# Patient Record
Sex: Female | Born: 1950
Health system: Southern US, Community
[De-identification: ages and names within clinical notes are randomized; demographics above are authoritative.]

## PROBLEM LIST (undated history)

## (undated) DIAGNOSIS — I499 Cardiac arrhythmia, unspecified: Secondary | ICD-10-CM

## (undated) DIAGNOSIS — N200 Calculus of kidney: Secondary | ICD-10-CM

## (undated) DIAGNOSIS — K5792 Diverticulitis of intestine, part unspecified, without perforation or abscess without bleeding: Secondary | ICD-10-CM

## (undated) DIAGNOSIS — M1990 Unspecified osteoarthritis, unspecified site: Secondary | ICD-10-CM

## (undated) DIAGNOSIS — I10 Essential (primary) hypertension: Secondary | ICD-10-CM

## (undated) DIAGNOSIS — Z8719 Personal history of other diseases of the digestive system: Secondary | ICD-10-CM

## (undated) DIAGNOSIS — J302 Other seasonal allergic rhinitis: Secondary | ICD-10-CM

## (undated) DIAGNOSIS — E119 Type 2 diabetes mellitus without complications: Secondary | ICD-10-CM

## (undated) DIAGNOSIS — R7989 Other specified abnormal findings of blood chemistry: Secondary | ICD-10-CM

## (undated) DIAGNOSIS — K5730 Diverticulosis of large intestine without perforation or abscess without bleeding: Secondary | ICD-10-CM

## (undated) DIAGNOSIS — N201 Calculus of ureter: Secondary | ICD-10-CM

## (undated) DIAGNOSIS — F419 Anxiety disorder, unspecified: Secondary | ICD-10-CM

## (undated) DIAGNOSIS — K219 Gastro-esophageal reflux disease without esophagitis: Secondary | ICD-10-CM

## (undated) DIAGNOSIS — Z87442 Personal history of urinary calculi: Secondary | ICD-10-CM

## (undated) DIAGNOSIS — Z9989 Dependence on other enabling machines and devices: Secondary | ICD-10-CM

## (undated) DIAGNOSIS — D649 Anemia, unspecified: Secondary | ICD-10-CM

## (undated) DIAGNOSIS — G4733 Obstructive sleep apnea (adult) (pediatric): Secondary | ICD-10-CM

## (undated) HISTORY — PX: COLON RESECTION: SHX5231

## (undated) HISTORY — PX: BARIATRIC SURGERY: SHX1103

## (undated) HISTORY — PX: OTHER SURGICAL HISTORY: SHX169

## (undated) HISTORY — DX: Other seasonal allergic rhinitis: J30.2

## (undated) HISTORY — PX: EXTRACORPOREAL SHOCK WAVE LITHOTRIPSY: SHX1557

## (undated) HISTORY — PX: CATARACT EXTRACTION: SUR2

---

## 1974-11-22 HISTORY — PX: CERVICAL CONE BIOPSY: SUR198

## 1985-11-22 HISTORY — PX: TUBAL LIGATION: SHX77

## 1998-07-03 ENCOUNTER — Ambulatory Visit (HOSPITAL_COMMUNITY): Admission: RE | Admit: 1998-07-03 | Discharge: 1998-07-03 | Payer: Self-pay

## 1998-07-07 ENCOUNTER — Ambulatory Visit (HOSPITAL_COMMUNITY): Admission: RE | Admit: 1998-07-07 | Discharge: 1998-07-07 | Payer: Self-pay | Admitting: *Deleted

## 1998-07-10 ENCOUNTER — Ambulatory Visit (HOSPITAL_COMMUNITY): Admission: RE | Admit: 1998-07-10 | Discharge: 1998-07-10 | Payer: Self-pay | Admitting: Urology

## 2011-07-10 ENCOUNTER — Observation Stay (HOSPITAL_COMMUNITY)
Admission: EM | Admit: 2011-07-10 | Discharge: 2011-07-11 | Disposition: A | Discharge status: Home / Self Care | Payer: Self-pay | Attending: Urology | Admitting: Urology

## 2011-07-10 ENCOUNTER — Emergency Department (HOSPITAL_COMMUNITY): Payer: Self-pay

## 2011-07-10 DIAGNOSIS — I10 Essential (primary) hypertension: Secondary | ICD-10-CM | POA: Insufficient documentation

## 2011-07-10 DIAGNOSIS — E119 Type 2 diabetes mellitus without complications: Secondary | ICD-10-CM | POA: Insufficient documentation

## 2011-07-10 DIAGNOSIS — D72829 Elevated white blood cell count, unspecified: Secondary | ICD-10-CM | POA: Insufficient documentation

## 2011-07-10 DIAGNOSIS — N201 Calculus of ureter: Principal | ICD-10-CM | POA: Insufficient documentation

## 2011-07-10 DIAGNOSIS — Z79899 Other long term (current) drug therapy: Secondary | ICD-10-CM | POA: Insufficient documentation

## 2011-07-10 DIAGNOSIS — K5730 Diverticulosis of large intestine without perforation or abscess without bleeding: Secondary | ICD-10-CM | POA: Insufficient documentation

## 2011-07-10 DIAGNOSIS — R112 Nausea with vomiting, unspecified: Secondary | ICD-10-CM | POA: Insufficient documentation

## 2011-07-10 DIAGNOSIS — N200 Calculus of kidney: Secondary | ICD-10-CM | POA: Insufficient documentation

## 2011-07-10 DIAGNOSIS — N1330 Unspecified hydronephrosis: Secondary | ICD-10-CM | POA: Insufficient documentation

## 2011-07-10 DIAGNOSIS — E860 Dehydration: Secondary | ICD-10-CM | POA: Insufficient documentation

## 2011-07-10 DIAGNOSIS — R109 Unspecified abdominal pain: Secondary | ICD-10-CM | POA: Insufficient documentation

## 2011-07-10 DIAGNOSIS — N179 Acute kidney failure, unspecified: Secondary | ICD-10-CM | POA: Insufficient documentation

## 2011-07-10 HISTORY — PX: OTHER SURGICAL HISTORY: SHX169

## 2011-07-10 LAB — CBC
Hemoglobin: 13.8 g/dL (ref 12.0–15.0)
RBC: 4.5 MIL/uL (ref 3.87–5.11)
WBC: 14.1 10*3/uL — ABNORMAL HIGH (ref 4.0–10.5)

## 2011-07-10 LAB — POCT I-STAT, CHEM 8
Creatinine, Ser: 1.9 mg/dL — ABNORMAL HIGH (ref 0.50–1.10)
HCT: 42 % (ref 36.0–46.0)
Hemoglobin: 14.3 g/dL (ref 12.0–15.0)
Potassium: 3.7 mEq/L (ref 3.5–5.1)
Sodium: 138 mEq/L (ref 135–145)

## 2011-07-10 LAB — GLUCOSE, CAPILLARY
Glucose-Capillary: 104 mg/dL — ABNORMAL HIGH (ref 70–99)
Glucose-Capillary: 117 mg/dL — ABNORMAL HIGH (ref 70–99)

## 2011-07-10 LAB — URINALYSIS, ROUTINE W REFLEX MICROSCOPIC
Bilirubin Urine: NEGATIVE
Ketones, ur: NEGATIVE mg/dL
Nitrite: NEGATIVE
pH: 6 (ref 5.0–8.0)

## 2011-07-10 LAB — URINE MICROSCOPIC-ADD ON

## 2011-07-11 LAB — URINE CULTURE
Colony Count: NO GROWTH
Culture: NO GROWTH

## 2011-07-11 LAB — BASIC METABOLIC PANEL
CO2: 28 mEq/L (ref 19–32)
Chloride: 102 mEq/L (ref 96–112)
GFR calc Af Amer: 41 mL/min — ABNORMAL LOW (ref >60)
Sodium: 138 mEq/L (ref 135–145)

## 2011-07-11 LAB — CBC
Platelets: 214 10*3/uL (ref 150–400)
RBC: 3.91 MIL/uL (ref 3.87–5.11)
WBC: 8.6 10*3/uL (ref 4.0–10.5)

## 2011-07-11 LAB — GLUCOSE, CAPILLARY

## 2011-07-15 NOTE — H&P (Signed)
Marilyn Myers, Marilyn Myers NO.:  [id number]  MEDICAL RECORD NO.:  [id number]  LOCATION:  1401                         FACILITY:  [facility]  PHYSICIAN:  Sebastian Ache, MD     DATE OF BIRTH:  [birth year]  DATE OF ADMISSION:  07/10/2011 DATE OF DISCHARGE:                             HISTORY & PHYSICAL   CHIEF COMPLAINT:  Flank pain, bilateral ureteral stent, acute renal failure.  HISTORY OF PRESENT ILLNESS:  Marilyn Myers is a 60 year old female with history of prior nephrolithiasis x1 who presents to [facility] ER with one week of left greater than right flank pain.  The CT evidenced bilateral ureteral stents.  She began having discomfort 12 weeks ago, which felt similar to a prior stone episode, is colicky and 5/10 to 8/10, located in the left flank, radiating towards the groin and was associated with nausea with some emesis.  Since it became acutely worse 3 days ago and became accompanied by more vomiting and severe pain.  The patient's husband became concerned about the emesis and pain and therefore she presents to the emergency room today.  She denies any fevers, dysuria, or history of severe cardiovascular disease.  PAST MEDICAL HISTORY: 1. Urolithiasis.  One prior episode of status post lithotripsy in 1999     by Dr. Patsi Sears. 2. Hypertension. 3. Type 2 diabetes.  SOCIAL HISTORY:  The patient does not drink, nonsmoker.  She denies domestic abuse.  FAMILY HISTORY:  Significant for coronary artery disease and diabetes. No nephrolithiasis.  REVIEW OF SYSTEMS:  NEUROLOGIC:  Denies dizziness, confusion. MUSCULOSKELETAL:  Admits to achy pain.  CARDIAC:  Denies chest pain. Denies shortness of breath.  GI:  Admits to nausea and vomiting.  GU: Denies hematuria, but admits decreased urine output.  HEMATOLOGIC: Denies any easy bruising.  PSYCHIATRIC:  Denies change of mood.  PHYSICAL EXAMINATION:  VITAL SIGNS:  Temp 97.4, pulse 104, respirations 22, blood pressure  134/86. GENERAL:  Marilyn Myers is a pleasant female who appears her stated age. She is accompanied by her husband. HEENT:  Normocephalic, atraumatic. CARDIAC:  Slightly tachycardiac, regular rhythm.  No murmurs, rubs, or gallops. PULMONARY:  No wheezing. ABDOMEN:  Obese.  Left greater than right CVA tenderness.  No surgical scars noted. GU:  Normal-appearing external genitalia. MUSCULOSKELETAL:  No cord, cyanosis, or edema. PSYCHIATRIC:  The patient is euthymic.  MEDICATIONS:  The patient is on lisinopril and metformin.  ALLERGIES:  GI upset with CODEINE  LABS:  CBC shows mild leukocytosis of 14,000; hemoglobin normal at 13.8. BMP significant for evidence of acute renal failure with creatinine of 1.9 and BUN 28.  Urinalysis without significant blood or white cells.  IMAGING:  CT stone study reveals bilateral ureteral stones, the right proximal 10 mm stone, 1200 Hounsfield Units associated what appears to be a chronically obstructed nature of the kidney. On the left side, a 5 mm distal UPJ stone of 300 Hounsfield Units was noted with mild hydroureteronephrosis.  ASSESSMENT:  A 60 year old female with bilateral obstructing stones and acute renal failure.  PLAN: 1. Bilateral stones, acute renal failure.  Options were discussed to     achieve the goal of short-term renal drain  including nephrostomy     tube versus stenting and the patient wished to proceed with a trial     of bilateral stenting.  Informed consent was obtained and placed in     the medical record and planned that her definitive management would     be informed in delayed fashion. 2. Nausea, vomiting, and dehydration.  The patient will be given     vigorous IV hydration. 3. Pain control. 4. Admit to urology service, Dr. Berneice Heinrich.          ______________________________ Sebastian Ache, MD     TM/MEDQ  D:  07/10/2011  T:  07/10/2011  Job:  [id number]  Electronically Signed by Lindaann Slough M.D. on 07/15/2011  05:13:53 PM

## 2011-07-15 NOTE — Op Note (Signed)
NAMEDEMPSEY, KNOTEK               ACCOUNT NO.:  [id number]  MEDICAL RECORD NO.:  [id number]  LOCATION:  1401                         FACILITY:  [facility]  PHYSICIAN:  Sebastian Ache, MD     DATE OF BIRTH:  19-Jan-1951  DATE OF PROCEDURE: DATE OF DISCHARGE:                              OPERATIVE REPORT   PREOPERATIVE DIAGNOSES: 1. Bilateral obstructing stones. 2. Acute renal failure.  POSTOPERATIVE DIAGNOSES: 1. Bilateral obstructing stones. 2. Acute renal failure.  PROCEDURES:  Cystoscopy with bilateral retrograde pyelograms and insertion of bilateral ureteral stents.  SURGEON:  Sebastian Ache, MD  FINDINGS: 1. Right proximal obstructing stone with significant moderate to     severe hydronephrosis and impaction of stone. 2. Left distal ureteral stones without significant     hydroureteronephrosis.  STENTS: 1. Right 6 x 28, no tether. 2. Left 6 x 24, no tether.  SPECIMENS:  None.  COMPLICATIONS:  None.  ESTIMATED BLOOD LOSS:  Nil.  ANESTHESIA:  General LMA.  INDICATION:  [name] is a 60 year old female with a remote history of prior nephrolithiasis, who presented with 1 week history of flank pain, progressive nausea and vomiting.  She underwent evaluation in the [facility] Emergency Room where axial imaging revealed bilateral ureteral stones and serum studies revealed acute renal failure.  Given this constellation of symptoms, it was felt that urgent renal decompression was warranted.  Options were discussed with the patient including ureteral stenting versus nephrostomy and she wished to proceed with ureteral stenting.  Informed consent was obtained and placed in the medical record.  PROCEDURE IN DETAIL:  Patient being Marilyn Myers, our objective being bilateral ureteral stents.  Operative case was confirmed.  Procedure was carried out __________intravenous antibiotics  administered.  General LMA anesthesia was introduced.  Patient was placed into a low  lithotomy position in the sterile field and secured.  We prepped and draped patient's vagina, introitus, and partial thighs using iodine x2.  Next, cystourethroscopy was performed using a 22-French rigid cystoscope with 30-degree lens.  Inspection of urinary bladder revealed no diverticula, calcifications, papillary lesions.  The ureteral orifices were in the normal anatomic position.  The right ureteral orifice was gently cannulated using a 6-French __________ retrograde pyelogram was seen.  Right retrograde pyelogram revealed this is single right ureter that was decompressed distally.  There was a calcification noted in the proximal third of the ureter with severe hydronephrosis above this point. Contrast minimally above this suggesting impaction.  Next, a sensor type wire was advanced very carefully along the said calcification and above it allowing navigation of the end-hole above it as well.  Further retrograde pyelography was performed which corroborated again severe hydronephrosis.  Next, a 6 x 24 stent was initially placed on the right side using cystocope under fluoroscopic guidance; however, the proximal curl was felt to be too distal given the significant dilation, and as such, this was exchanged over a sensor wire  for a 6 x 28 stent which was curled proximally into a dilated  calyx.  This appeared to be in excellent position.  There was efflux of urine through in and out of the distal end of the stent.  Attention was  directed to the left side.  A 6- French catheter was inserted to left ureter and left retrograde pyelogram was seen.  Left retro pyelogram demonstrated several small filling defects in the distal ureter consistent with known stone.  There was minimal hydroureteronephrosis above this.  A 0.06 guide wire was advanced to the level of the upper pole of the left kidney over which a new 6 x 24 double-J stent was placed using cystoscopy and fluoroscopic  guidance. Good proximal and distal curls were noted.  There was efflux of urine seen around and through the distal end of the stent.  Next, the cystoscope was changed for a new 16-French Foley catheter per urethra to straight drain with 10 mL of sterile water  in the balloon.  Procedure was then terminated.  The patient tolerated the procedure well.  There were no immediate perioperative complications. .  Patient was taken to Postanesthesia Care Unit in stable condition.          ______________________________ Sebastian Ache, MD     TM/MEDQ  D:  07/10/2011  T:  07/11/2011  Job:  [id number]  Electronically Signed by Lindaann Slough M.D. on 07/15/2011 05:13:41 PM

## 2011-08-18 ENCOUNTER — Other Ambulatory Visit: Payer: Self-pay | Admitting: Urology

## 2011-08-18 ENCOUNTER — Other Ambulatory Visit (HOSPITAL_COMMUNITY): Payer: Self-pay | Admitting: Urology

## 2011-08-18 ENCOUNTER — Ambulatory Visit (HOSPITAL_COMMUNITY)
Admission: RE | Admit: 2011-08-18 | Discharge: 2011-08-18 | Disposition: A | Discharge status: Home / Self Care | Payer: Self-pay | Source: Ambulatory Visit | Attending: Urology | Admitting: Urology

## 2011-08-18 ENCOUNTER — Encounter (HOSPITAL_COMMUNITY): Payer: Self-pay

## 2011-08-18 DIAGNOSIS — Z01812 Encounter for preprocedural laboratory examination: Secondary | ICD-10-CM | POA: Insufficient documentation

## 2011-08-18 DIAGNOSIS — Z01818 Encounter for other preprocedural examination: Secondary | ICD-10-CM | POA: Insufficient documentation

## 2011-08-18 DIAGNOSIS — Z01810 Encounter for preprocedural cardiovascular examination: Secondary | ICD-10-CM | POA: Insufficient documentation

## 2011-08-18 LAB — BASIC METABOLIC PANEL
BUN: 16 mg/dL (ref 6–23)
Chloride: 102 mEq/L (ref 96–112)
GFR calc Af Amer: >60 mL/min (ref >60)
GFR calc non Af Amer: 58 mL/min — ABNORMAL LOW (ref >60)
Potassium: 3.1 mEq/L — ABNORMAL LOW (ref 3.5–5.1)
Sodium: 140 mEq/L (ref 135–145)

## 2011-08-18 LAB — CBC
HCT: 38.2 % (ref 36.0–46.0)
Hemoglobin: 12.6 g/dL (ref 12.0–15.0)
RDW: 13.5 % (ref 11.5–15.5)
WBC: 10.3 10*3/uL (ref 4.0–10.5)

## 2011-08-18 LAB — SURGICAL PCR SCREEN: Staphylococcus aureus: NEGATIVE

## 2011-08-20 ENCOUNTER — Ambulatory Visit (HOSPITAL_COMMUNITY)
Admission: RE | Admit: 2011-08-20 | Discharge: 2011-08-20 | Disposition: A | Discharge status: Home / Self Care | Payer: Self-pay | Source: Ambulatory Visit | Attending: Urology | Admitting: Urology

## 2011-08-20 DIAGNOSIS — I10 Essential (primary) hypertension: Secondary | ICD-10-CM | POA: Insufficient documentation

## 2011-08-20 DIAGNOSIS — E119 Type 2 diabetes mellitus without complications: Secondary | ICD-10-CM | POA: Insufficient documentation

## 2011-08-20 DIAGNOSIS — Z01812 Encounter for preprocedural laboratory examination: Secondary | ICD-10-CM | POA: Insufficient documentation

## 2011-08-20 DIAGNOSIS — Z79899 Other long term (current) drug therapy: Secondary | ICD-10-CM | POA: Insufficient documentation

## 2011-08-20 DIAGNOSIS — Z01818 Encounter for other preprocedural examination: Secondary | ICD-10-CM | POA: Insufficient documentation

## 2011-08-20 DIAGNOSIS — N201 Calculus of ureter: Secondary | ICD-10-CM | POA: Insufficient documentation

## 2011-08-20 DIAGNOSIS — E669 Obesity, unspecified: Secondary | ICD-10-CM | POA: Insufficient documentation

## 2011-08-20 DIAGNOSIS — Z01810 Encounter for preprocedural cardiovascular examination: Secondary | ICD-10-CM | POA: Insufficient documentation

## 2011-08-20 LAB — GLUCOSE, CAPILLARY
Glucose-Capillary: 139 mg/dL — ABNORMAL HIGH (ref 70–99)
Glucose-Capillary: 126 mg/dL — ABNORMAL HIGH (ref 70–99)

## 2011-08-27 NOTE — Op Note (Signed)
NAMESANIYA, TRANCHINA NO.:  [id number]  MEDICAL RECORD NO.:  [id number]  LOCATION:  DAYL                         FACILITY:  [facility]  PHYSICIAN:  Danae Chen, M.D.  DATE OF BIRTH:  1950-11-26  DATE OF PROCEDURE:  08/20/2011 DATE OF DISCHARGE:                              OPERATIVE REPORT   PREOPERATIVE DIAGNOSIS:  Bilateral renal calculi.  PROCEDURE DONE:  Cystoscopy, retrograde pyelograms, ureteroscopy, left ureteral stone extraction, and insertion of double-J stent.  SURGEON:  Danae Chen, MD  ANESTHESIA:  General.  INDICATION:  The patient is a 60 year old female who was seen in the ER on July 10, 2011, with bilateral flank pain.  She also had renal insufficiency.  Her BUN was 28, creatinine 1.9.  CT scan showed a 1 cm stone in the proximal ureter with moderate-to-severe right hydronephrosis and a 5 mm stone in the left distal ureter without hydronephrosis.  Bilateral double-J stent were inserted on August 19th. Her BUN is down to 26, creatinine is down to 0.97.  Treatment options were discussed with the patient, ureteroscopy with stone extraction versus ESL.  She elected to have stone extraction.  She is scheduled today for the procedure.  The patient was identified by her wristband and proper time-out was taken.  Under general anesthesia, she was prepped and draped, and placed in the dorsal lithotomy position.  A panendoscope was inserted in the bladder. The bladder mucosa is reddened.  The trigone is edematous.  The distal ends of the double-J stent are in the bladder.  The left double-J stent was then pulled out of the urethra with a grasping forceps and  a sensor wire could not be passed through the stent because of encrustations.  The double-J stent was then removed.  Left retrograde pyelogram.  A cone-tip catheter was passed through the cystoscope and through the left ureteral orifice and contrast was injected through the cone-tip catheter.   There is a filling defect in the distal ureter.  The proximal ureter appears normal.  The renal pelvis and calyces are normal.  The cone-tip catheter was removed.  A sensor wire was then passed through the cystoscope into the left ureter.  The cystoscope was removed.  A semi-rigid ureteroscope was then passed in the bladder and through the left ureter.  A stone was visualized in the distal ureter.  The stone was grasped within the wires of a nitinol basket and extracted without difficulty.  The ureteroscope was then re-inserted in the ureter and advanced up the ureter without difficulty.  There is no evidence of stone in the ureter.  Since the ureter is patent, it was decided not to place a double-J stent on that side.  The ureteroscope was removed.  The cystoscope was then re-inserted in the bladder and the right double-J stent was pulled out of the urethra. Again, a sensor wire could not be passed through the double-J stent because of encrustations.  The double-J stent was then removed.  Retrograde pyelogram.  A cone-tip catheter was passed through the cystoscope and through the right ureteral orifice.  Contrast was then injected through the cone-tip catheter.  The distal and mid ureter appear  normal.  There appears to be a filling defect in the proximal ureter.  The calyces and renal pelvis are mildly dilated.  The cone-tip catheter was then removed.  A sensor wire was passed through the cystoscope and through the right ureter.  A semi-rigid ureteroscope was then inserted in the bladder and through the right ureter and advanced all the way up into the upper ureter.  There is a stone in the proximal ureter, but the stone then migrated into the kidney.  The semi-rigid ureteroscope was then removed. A ureteroscope access sheath was then passed over the sensor wire and the sensor wire was removed.  A flexible digital ureteroscope was passed through the ureteroscope access sheath, all  the way up the ureter into the renal pelvis.  The stone had migrated into a calix, but because of the position of the stone, it was difficult to place the stone within the wires of a nitinol basket.  A holmium laser was then passed through the ureteroscope.  There were multiple debris in the calix.  Several attempts were made to fragment the stone with the holmium laser, but because of the debris in the calix, it was difficult to clearly have a good shot at fragmenting the stone.  After several attempts, it was decided to end the procedure.  The flexible ureteroscope was then removed.  A sensor wire was passed through the ureteroscope access sheath and the ureteroscope access sheath was removed.  The sensor wire was then back loaded into the cystoscope and a 6-26 double-J stent was passed over the guidewire.  The guidewire was removed.  The proximal curl of the double-J stent is in the renal pelvis.  The distal curl is in the bladder.  The bladder was then emptied and cystoscope and guidewire were removed.  The patient tolerated the procedure well and left the OR in satisfactory condition to post-anesthesia care unit.   Danae Chen, M.D.     MN/MEDQ  D:  08/20/2011  T:  08/20/2011  Job:  [id number]  Electronically Signed by Lindaann Slough M.D. on 08/27/2011 09:07:26 AM

## 2011-09-29 ENCOUNTER — Other Ambulatory Visit: Payer: Self-pay | Admitting: Urology

## 2011-09-30 ENCOUNTER — Other Ambulatory Visit: Payer: Self-pay | Admitting: Urology

## 2011-10-05 ENCOUNTER — Encounter (HOSPITAL_COMMUNITY): Payer: Self-pay

## 2011-10-05 ENCOUNTER — Encounter (HOSPITAL_COMMUNITY): Payer: Self-pay | Admitting: *Deleted

## 2011-10-05 NOTE — Progress Notes (Signed)
Instructed no Aspirin products 3 days prior to procedure' Reminded to take laxitive Wed PM

## 2011-10-07 ENCOUNTER — Ambulatory Visit (HOSPITAL_COMMUNITY)
Admission: RE | Admit: 2011-10-07 | Discharge: 2011-10-07 | Disposition: A | Discharge status: Home / Self Care | Payer: Self-pay | Source: Ambulatory Visit | Attending: Urology | Admitting: Urology

## 2011-10-07 ENCOUNTER — Encounter (HOSPITAL_COMMUNITY)
Admission: RE | Disposition: A | Discharge status: Home / Self Care | Payer: Self-pay | Source: Ambulatory Visit | Attending: Urology

## 2011-10-07 DIAGNOSIS — E669 Obesity, unspecified: Secondary | ICD-10-CM | POA: Insufficient documentation

## 2011-10-07 DIAGNOSIS — I10 Essential (primary) hypertension: Secondary | ICD-10-CM | POA: Insufficient documentation

## 2011-10-07 DIAGNOSIS — E119 Type 2 diabetes mellitus without complications: Secondary | ICD-10-CM | POA: Insufficient documentation

## 2011-10-07 DIAGNOSIS — N201 Calculus of ureter: Secondary | ICD-10-CM | POA: Insufficient documentation

## 2011-10-07 HISTORY — DX: Essential (primary) hypertension: I10

## 2011-10-07 LAB — GLUCOSE, CAPILLARY: Glucose-Capillary: 111 mg/dL — ABNORMAL HIGH (ref 70–99)

## 2011-10-07 SURGERY — LITHOTRIPSY, ESWL
Anesthesia: LOCAL | Laterality: Right

## 2011-10-07 MED ORDER — METFORMIN HCL 500 MG PO TABS
500.0000 mg | ORAL_TABLET | Freq: Two times a day (BID) | ORAL | Status: DC
  Filled 2011-10-07 (×3): qty 1

## 2011-10-07 MED ORDER — DOCUSATE SODIUM 100 MG PO CAPS
100.0000 mg | ORAL_CAPSULE | Freq: Every day | ORAL | Status: DC
  Filled 2011-10-07 (×2): qty 1

## 2011-10-07 MED ORDER — DIPHENHYDRAMINE HCL 25 MG PO CAPS
25.0000 mg | ORAL_CAPSULE | ORAL | Status: AC
  Administered 2011-10-07: 25 mg via ORAL

## 2011-10-07 MED ORDER — LISINOPRIL 20 MG PO TABS
20.0000 mg | ORAL_TABLET | Freq: Every day | ORAL | Status: DC
  Filled 2011-10-07 (×2): qty 1

## 2011-10-07 MED ORDER — DIPHENHYDRAMINE HCL 25 MG PO CAPS
ORAL_CAPSULE | ORAL | Status: AC
  Administered 2011-10-07: 25 mg via ORAL
  Filled 2011-10-07: qty 1

## 2011-10-07 MED ORDER — DIAZEPAM 5 MG PO TABS
10.0000 mg | ORAL_TABLET | ORAL | Status: AC
  Administered 2011-10-07: 10 mg via ORAL

## 2011-10-07 MED ORDER — DEXTROSE-NACL 5-0.45 % IV SOLN
INTRAVENOUS | Status: DC
  Administered 2011-10-07: 11:00:00 via INTRAVENOUS

## 2011-10-07 MED ORDER — DIAZEPAM 5 MG PO TABS
ORAL_TABLET | ORAL | Status: AC
  Administered 2011-10-07: 10 mg via ORAL
  Filled 2011-10-07: qty 2

## 2011-10-07 MED ORDER — CIPROFLOXACIN IN D5W 400 MG/200ML IV SOLN
400.0000 mg | Freq: Once | INTRAVENOUS | Status: AC
  Administered 2011-10-07: 400 mg via INTRAVENOUS
  Filled 2011-10-07: qty 200

## 2011-10-07 NOTE — Progress Notes (Signed)
After IV Cipro had infused and IV fluids were flushing the line pt c/o of itching at IV site and up arm . Pink area about 6cmx3cm  Right wrist and right forarm 3cm by 3cm pt is scratiching this area. No other itching area and pt denies any resp problems. Cool cloth to righ arm Offered to call MD for order for Benadryl and pt declined this. She is eager to go home. Pt states she has Benadryl at home and will take it when she gets there.

## 2011-10-07 NOTE — Progress Notes (Signed)
Pt denies any aspirin ,ibuprofen,toradol in last 72 hours Pt took laxative as directed last night ....good results

## 2011-10-08 ENCOUNTER — Encounter (HOSPITAL_BASED_OUTPATIENT_CLINIC_OR_DEPARTMENT_OTHER): Payer: Self-pay | Admitting: Anesthesiology

## 2011-10-08 ENCOUNTER — Encounter (HOSPITAL_BASED_OUTPATIENT_CLINIC_OR_DEPARTMENT_OTHER)
Admission: RE | Disposition: A | Discharge status: Home / Self Care | Payer: Self-pay | Source: Ambulatory Visit | Attending: Urology

## 2011-10-08 ENCOUNTER — Encounter (HOSPITAL_BASED_OUTPATIENT_CLINIC_OR_DEPARTMENT_OTHER): Payer: Self-pay | Admitting: *Deleted

## 2011-10-08 ENCOUNTER — Ambulatory Visit (HOSPITAL_BASED_OUTPATIENT_CLINIC_OR_DEPARTMENT_OTHER): Payer: Self-pay | Admitting: Anesthesiology

## 2011-10-08 ENCOUNTER — Other Ambulatory Visit: Payer: Self-pay | Admitting: Urology

## 2011-10-08 ENCOUNTER — Ambulatory Visit (HOSPITAL_BASED_OUTPATIENT_CLINIC_OR_DEPARTMENT_OTHER)
Admission: RE | Admit: 2011-10-08 | Discharge: 2011-10-08 | Disposition: A | Discharge status: Home / Self Care | Payer: Self-pay | Source: Ambulatory Visit | Attending: Urology | Admitting: Urology

## 2011-10-08 DIAGNOSIS — N1330 Unspecified hydronephrosis: Secondary | ICD-10-CM | POA: Insufficient documentation

## 2011-10-08 DIAGNOSIS — Z79899 Other long term (current) drug therapy: Secondary | ICD-10-CM | POA: Insufficient documentation

## 2011-10-08 DIAGNOSIS — I10 Essential (primary) hypertension: Secondary | ICD-10-CM | POA: Insufficient documentation

## 2011-10-08 DIAGNOSIS — E119 Type 2 diabetes mellitus without complications: Secondary | ICD-10-CM | POA: Insufficient documentation

## 2011-10-08 DIAGNOSIS — N201 Calculus of ureter: Secondary | ICD-10-CM | POA: Insufficient documentation

## 2011-10-08 HISTORY — PX: CYSTOSCOPY/RETROGRADE/URETEROSCOPY: SHX5316

## 2011-10-08 LAB — POCT I-STAT 4, (NA,K, GLUC, HGB,HCT)
Glucose, Bld: 100 mg/dL — ABNORMAL HIGH (ref 70–99)
HCT: 39 % (ref 36.0–46.0)

## 2011-10-08 LAB — GLUCOSE, CAPILLARY: Glucose-Capillary: 93 mg/dL (ref 70–99)

## 2011-10-08 SURGERY — CYSTOSCOPY/RETROGRADE/URETEROSCOPY
Anesthesia: Choice | Site: Ureter | Wound class: Clean Contaminated

## 2011-10-08 MED ORDER — DEXTROSE-NACL 5-0.45 % IV SOLN: INTRAVENOUS | Status: DC

## 2011-10-08 MED ORDER — PROMETHAZINE HCL 25 MG/ML IJ SOLN: 6.2500 mg | INTRAMUSCULAR | Status: DC | PRN

## 2011-10-08 MED ORDER — LIDOCAINE HCL (CARDIAC) 20 MG/ML IV SOLN
INTRAVENOUS | Status: DC | PRN
  Administered 2011-10-08: 60 mg via INTRAVENOUS

## 2011-10-08 MED ORDER — LACTATED RINGERS IV SOLN
INTRAVENOUS | Status: DC
  Administered 2011-10-08 (×3): via INTRAVENOUS

## 2011-10-08 MED ORDER — LACTATED RINGERS IV SOLN: INTRAVENOUS | Status: DC

## 2011-10-08 MED ORDER — ONDANSETRON HCL 4 MG/2ML IJ SOLN
INTRAMUSCULAR | Status: DC | PRN
  Administered 2011-10-08: 4 mg via INTRAVENOUS

## 2011-10-08 MED ORDER — MIDAZOLAM HCL 5 MG/5ML IJ SOLN
INTRAMUSCULAR | Status: DC | PRN
  Administered 2011-10-08: 1 mg via INTRAVENOUS

## 2011-10-08 MED ORDER — SODIUM CHLORIDE 0.9 % IR SOLN
Status: DC | PRN
  Administered 2011-10-08: 9000 mL

## 2011-10-08 MED ORDER — EPHEDRINE SULFATE 50 MG/ML IJ SOLN
INTRAMUSCULAR | Status: DC | PRN
  Administered 2011-10-08: 10 mg via INTRAVENOUS

## 2011-10-08 MED ORDER — HYDROCODONE-ACETAMINOPHEN 5-325 MG PO TABS: 1.0000 | ORAL_TABLET | Freq: Four times a day (QID) | ORAL | Status: AC | PRN

## 2011-10-08 MED ORDER — IOHEXOL 350 MG/ML SOLN
INTRAVENOUS | Status: DC | PRN
  Administered 2011-10-08: 50 mL

## 2011-10-08 MED ORDER — FENTANYL CITRATE 0.05 MG/ML IJ SOLN: 25.0000 ug | INTRAMUSCULAR | Status: DC | PRN

## 2011-10-08 MED ORDER — CEFAZOLIN SODIUM 1-5 GM-% IV SOLN
1.0000 g | Freq: Three times a day (TID) | INTRAVENOUS | Status: DC
  Administered 2011-10-08: 2 g via INTRAVENOUS

## 2011-10-08 MED ORDER — PROPOFOL 10 MG/ML IV EMUL
INTRAVENOUS | Status: DC | PRN
  Administered 2011-10-08: 200 mg via INTRAVENOUS

## 2011-10-08 MED ORDER — FENTANYL CITRATE 0.05 MG/ML IJ SOLN
INTRAMUSCULAR | Status: DC | PRN
  Administered 2011-10-08 (×10): 25 ug via INTRAVENOUS
  Administered 2011-10-08: 50 ug via INTRAVENOUS
  Administered 2011-10-08: 25 ug via INTRAVENOUS
  Administered 2011-10-08: 50 ug via INTRAVENOUS
  Administered 2011-10-08: 25 ug via INTRAVENOUS
  Administered 2011-10-08: 50 ug via INTRAVENOUS

## 2011-10-08 SURGICAL SUPPLY — 38 items
ADAPTER CATH URET PLST 4-6FR (CATHETERS) ×2 IMPLANT
BAG DRAIN URO-CYSTO SKYTR STRL (DRAIN) ×2 IMPLANT
BASKET LASER NITINOL 1.9FR (BASKET) IMPLANT
BASKET SEGURA 3FR (UROLOGICAL SUPPLIES) IMPLANT
BASKET STNLS GEMINI 4WIRE 3FR (BASKET) IMPLANT
BASKET ZERO TIP NITINOL 2.4FR (BASKET) ×6 IMPLANT
BRUSH URET BIOPSY 3F (UROLOGICAL SUPPLIES) IMPLANT
CANISTER SUCT LVC 12 LTR MEDI- (MISCELLANEOUS) ×2 IMPLANT
CATH INTERMIT  6FR 70CM (CATHETERS) ×2 IMPLANT
CATH URET 5FR 28IN CONE TIP (BALLOONS) ×1
CATH URET 5FR 28IN OPEN ENDED (CATHETERS) ×2 IMPLANT
CATH URET 5FR 70CM CONE TIP (BALLOONS) ×1 IMPLANT
CLOTH BEACON ORANGE TIMEOUT ST (SAFETY) ×2 IMPLANT
DRAPE CAMERA CLOSED 9X96 (DRAPES) ×2 IMPLANT
ELECT REM PT RETURN 9FT ADLT (ELECTROSURGICAL)
ELECTRODE REM PT RTRN 9FT ADLT (ELECTROSURGICAL) IMPLANT
GLOVE BIO SURGEON STRL SZ7 (GLOVE) ×2 IMPLANT
GLOVE BIOGEL M STER SZ 6 (GLOVE) ×2 IMPLANT
GLOVE ECLIPSE 6.5 STRL STRAW (GLOVE) ×2 IMPLANT
GOWN PREVENTION PLUS LG XLONG (DISPOSABLE) ×4 IMPLANT
GOWN XL W/COTTON TOWEL STD (GOWNS) IMPLANT
GUIDEWIRE 0.038 PTFE COATED (WIRE) IMPLANT
GUIDEWIRE ANG ZIPWIRE 038X150 (WIRE) ×2 IMPLANT
GUIDEWIRE STR DUAL SENSOR (WIRE) ×2 IMPLANT
IV NS IRRIG 3000ML ARTHROMATIC (IV SOLUTION) ×6 IMPLANT
KIT BALLIN UROMAX 15FX10 (LABEL) IMPLANT
KIT BALLN UROMAX 15FX4 (MISCELLANEOUS) IMPLANT
KIT BALLN UROMAX 26 75X4 (MISCELLANEOUS)
LASER FIBER DISP (UROLOGICAL SUPPLIES) ×4 IMPLANT
LASER FIBER DISP 1000U (UROLOGICAL SUPPLIES) IMPLANT
PACK CYSTOSCOPY (CUSTOM PROCEDURE TRAY) ×2 IMPLANT
SET HIGH PRES BAL DIL (LABEL)
SHEATH URET ACCESS 12FR/35CM (UROLOGICAL SUPPLIES) IMPLANT
SHEATH URET ACCESS 12FR/55CM (UROLOGICAL SUPPLIES) IMPLANT
STENT CONTOUR GW .038 8FR 26CM (STENTS) IMPLANT
STENT CONTOUR NO GW 8FR 26CM (STENTS) ×2 IMPLANT
SYRINGE IRR TOOMEY STRL 70CC (SYRINGE) ×2 IMPLANT
WATER STERILE IRR 500ML POUR (IV SOLUTION) ×2 IMPLANT

## 2011-10-08 NOTE — Op Note (Signed)
Marilyn Myers is a 60 y.o.   10/08/2011  [facility]  Surgeon: Wendie Simmer. [name]  Anesthesia: General    Indication: The patient is a 60 years old female who had ESL of a right distal ureteral calculus yesterday.  She has not passed any stone fragments.  KUB before ESL revealed several calcifications in the area of the proximal ureter.  Renal ultrasound this morning showed severe right hydronephrosis secondary to a 12 mm proximal ureteral stone.  She is taken to the OR for cystoscopy, retrograde pyelogram, ureteroscopy, holmium laaser, stone manipulation, JJ stent.  The patient was identified by her wrist band and proper time out was taken.      Procedure:  Undre general anesthesia the patient was prepped and draped and placed in the dorsolithotomy position.  A panendoscope was inserted in the bladder.  The bladder mucosa is reddened.  There is no stone or tumor in the bladder.  The ureteral orifices are in normal position.    RETROGRADE PYELOGRAM:  A cone tip catheter was passed through the cystoscope and through the right ureteral orifice.  Contrast was injected through the cone tip catheter.  The distal ureter appears normal.  There are several filling defects in the proximal ureter.  The ureter proximal to those filling defects was not filled with contrast.  The cone tip catheter was removed.    A sensor wire was passed through the cystoscope and through the ureter but could not advance beyond the stone.  I replaced the sensor wire with a glide wire and passed the wire around the stones and in the renal pelvis.  I then passed a # 6 Fr open ended catheter over the glide wire and replaced it with the sensor wire.  The open ended catheter and cystoscope were then removed.  A semi rigid ureteroscope was then passed in the bladder and through the right ureteral orifice.  The ureteroscope was advanced up to the distal calculus.  The stone is fairly large.  I fragmented the stone with the holmium laser  and removed several stone fragments with the Nitinol basket.  There were several other stones in the ureter.  All ureteral stones were fragmented with the holmium laser and all stone fragments were extracted.  Some fragments were dropped in the bladder.  The ureteroscope was advanced up to the renal pelvis.  There are no other fragments in the ureter.  Contrast was then injected through the ureteroscope.  There is no extravasation of contrast,  The ureteroscope was then removed.   The sensor wire was back loaded into the cystoscope and a # 8 Fr-26 JJ stent was passed over the guide wire.  The guide wire was then removed.  The proximal curl of the JJ stent is in the renal pelvis and the distal curl in the bladder.    The stone fragments were then irrigated out of the bladder.  The bladder was emptied and the cystoscope removed.  The patient tolerated the procedure well and left the OR in satisfactory condition to PACU.

## 2011-10-08 NOTE — Anesthesia Procedure Notes (Signed)
Procedure Name: LMA Insertion Date/Time: 10/08/2011 3:48 PM Performed by: Lorrin Jackson Pre-anesthesia Checklist: Patient identified, Emergency Drugs available, Suction available and Patient being monitored Patient Re-evaluated:Patient Re-evaluated prior to inductionOxygen Delivery Method: Circle System Utilized Preoxygenation: Pre-oxygenation with 100% oxygen Intubation Type: IV induction Ventilation: Mask ventilation without difficulty LMA: LMA inserted LMA Size: 5.0 Number of attempts: 1 Placement Confirmation: positive ETCO2 Tube secured with: Tape Dental Injury: Teeth and Oropharynx as per pre-operative assessment

## 2011-10-08 NOTE — Transfer of Care (Signed)
Immediate Anesthesia Transfer of Care Note  Patient: Marilyn Myers  Procedure(s) Performed:  CYSTOSCOPY/RETROGRADE/URETEROSCOPY - CYSTOSCOPY, RIGHT  RETROGRADE, RIGHT URETEROSCOPY HOLMIUM LASER WITH BASKET STONE EXTRACTION AND RIGHT URETERAL STENT PLACEMENT  Patient Location: PACU  Anesthesia Type: General  Level of Consciousness: awake and alert   Airway & Oxygen Therapy: Patient Spontanous Breathing and Patient connected to face mask oxygen  Post-op Assessment: Report given to PACU RN and Post -op Vital signs reviewed and stable  Post vital signs: Reviewed and stable  Complications: No apparent anesthesia complications

## 2011-10-08 NOTE — H&P (Signed)
Marilyn Myers is an 60 y.o. female.    HPI: [name] had ESL right distal ureteral calculus yesterday.  KUB had also shown calcifications in the proximal ureter.  Renal ultrasound today shows severe hydronephrosis secondary to a 14 mm calculus in the proximal ureter.  She needs cystoscopy, retrograde pyelogram, ureteroscopy, holmium laser, stone manipulation, JJ stent.  Past Medical History  Diagnosis Date  . Hypertension 10-08-11  . Diabetes mellitus 10-08-11    Past Surgical History  Procedure Date  . Lithotripsy 1998  . Coniztiion 1976  . Stone extraction with basket september 2012  . Tubal ligation 10-08-11    1987  . Cervical cone biopsy   . Stone extraction with basket     sept   2012       aug  2012    Medications Prior to Admission  Medication Dose Route Frequency Provider Last Rate Last Dose  . ciprofloxacin (CIPRO) IVPB 400 mg  400 mg Intravenous Once Lindaann Slough, MD   400 mg at 10/07/11 1510  . diazepam (VALIUM) tablet 10 mg  10 mg Oral On Call    10 mg at 10/07/11 1108  . diphenhydrAMINE (BENADRYL) capsule 25 mg  25 mg Oral On Call    25 mg at 10/07/11 1109  . DISCONTD: dextrose 5 %-0.45 % sodium chloride infusion   Intravenous Continuous Lindaann Slough, MD 125 mL/hr at 10/07/11 1121    . DISCONTD: docusate sodium (COLACE) capsule 100 mg  100 mg Oral Daily Francie Keeling-Henry Mkayla Steele, MD      . DISCONTD: lisinopril (PRINIVIL,ZESTRIL) tablet 20 mg  20 mg Oral Daily Josephus Harriger-Henry Kharter Brew, MD      . DISCONTD: metFORMIN (GLUCOPHAGE) tablet 500 mg  500 mg Oral BID WC Lindaann Slough, MD       Medications Prior to Admission  Medication Sig Dispense Refill  . lisinopril (PRINIVIL,ZESTRIL) 20 MG tablet Take 20 mg by mouth daily.        . metFORMIN (GLUCOPHAGE) 500 MG tablet Take 500 mg by mouth 2 (two) times daily with a meal.          Allergies:  Allergies  Allergen Reactions  . Codeine Nausea And Vomiting    No family history on file.  Social History:  reports that she has  quit smoking. She does not have any smokeless tobacco history on file. She reports that she drinks alcohol. She reports that she does not use illicit drugs.  Review of Systems: Pertinent items are noted in HPI. A comprehensive review of systems was negative except as noted in the HPI.  Results for orders placed during the hospital encounter of 10/07/11 (from the past 48 hour(s))  GLUCOSE, CAPILLARY     Status: Abnormal   Collection Time   10/07/11 11:21 AM      Component Value Range Comment   Glucose-Capillary 111 (*) 70 - 99 (mg/dL)    Comment 1 Documented in Chart       Dg Abd 1 View  10/07/2011  *RADIOLOGY REPORT*  Clinical Data: Preop for lithotripsy.  Right-sided stone.  ABDOMEN - 1 VIEW  Comparison: CT of 07/10/2011.  Plain film of 09/09/2011.  Findings: 5 mm stone projects over the inter/lower pole right kidney.  5 mm stone projects over the interpolar left kidney.  A 11 mm calcification projects just cephalad the right iliac crest. This likely corresponds to the proximal and mid right ureteric stone on the prior exam. Cannot cannot exclude an adjacent more laterally positioned  stone.  Right pelvic calcification is favored to be vascular.  The tiny left ureterovesicular junction stone is not definitely visualized.  IMPRESSION:  1.  Bilateral renal calculi. 2.  Probable mid right ureteric calculus, as before. 3.  Lack of definite visualization of the left ureterovesicular junction calculus.  Original Report Authenticated By: Consuello Bossier, M.D.    Temp:  [97 F (36.1 C)-98.3 F (36.8 C)] 98.3 F (36.8 C) (11/16 1211) Pulse Rate:  [70-86] 85  (11/16 1211) Resp:  [14-20] 20  (11/16 1211) BP: (114-136)/(75-81) 136/80 mmHg (11/16 1211) SpO2:  [95 %-97 %] 97 % (11/16 1211)  Physical Exam: General appearance: alert and appears stated age Head: Normocephalic, without obvious abnormality, atraumatic Eyes: conjunctivae/corneas clear. EOM's intact.  Oropharynx: moist mucous  membranes Neck: supple, symmetrical, trachea midline Resp: normal respiratory effort Cardio: regular rate and rhythm Back: symmetric, no curvature. ROM normal. No CVA tenderness. GI: soft, non-tender; bowel sounds normal; no masses,  no organomegaly Female genitalia: penis: normal female phallus with no lesions or discharge.Testes: bilaterally descended with no masses or tenderness. no hernias Pelvic: deferred Extremities: extremities normal, atraumatic, no cyanosis or edema Skin: Skin color normal. No visible rashes or lesions Neurologic: Grossly normal  Assessment/Plan Right hydronephrosis, right ureteral calculus.  Plan: cystoscopy, retrograde pyelogram, ureteroscopy, holmium laser, stone manipulation, JJ stent.  Neev Mcmains-HENRY 10/08/2011, 12:52 PM

## 2011-10-08 NOTE — Anesthesia Preprocedure Evaluation (Addendum)
Anesthesia Evaluation  Patient identified by MRN, date of birth, ID band Patient awake    Reviewed: Allergy & Precautions, H&P , NPO status , Patient's Chart, lab work & pertinent test results  Airway Mallampati: II TM Distance: >3 FB Neck ROM: Full    Dental  (+) Teeth Intact and Dental Advisory Given,    Pulmonary neg pulmonary ROS,    Pulmonary exam normal       Cardiovascular hypertension, neg cardio ROS Regular Normal    Neuro/Psych Negative Neurological ROS  Negative Psych ROS   GI/Hepatic negative GI ROS, Neg liver ROS,   Endo/Other  Negative Endocrine ROSDiabetes mellitus-, Well Controlled  Renal/GU negative Renal ROS  Genitourinary negative   Musculoskeletal negative musculoskeletal ROS (+)   Abdominal Normal abdominal exam  (+)   Peds negative pediatric ROS (+)  Hematology negative hematology ROS (+)   Anesthesia Other Findings   Reproductive/Obstetrics negative OB ROS                      Anesthesia Physical Anesthesia Plan  ASA: III  Anesthesia Plan: General   Post-op Pain Management:    Induction:   Airway Management Planned: LMA  Additional Equipment:   Intra-op Plan:   Post-operative Plan:   Informed Consent: I have reviewed the patients History and Physical, chart, labs and discussed the procedure including the risks, benefits and alternatives for the proposed anesthesia with the patient or authorized representative who has indicated his/her understanding and acceptance.     Plan Discussed with: CRNA and Surgeon  Anesthesia Plan Comments:         Anesthesia Quick Evaluation

## 2011-10-10 NOTE — Anesthesia Postprocedure Evaluation (Signed)
Anesthesia Post Note  Patient: Marilyn Myers  Procedure(s) Performed:  CYSTOSCOPY/RETROGRADE/URETEROSCOPY - CYSTOSCOPY, RIGHT  RETROGRADE, RIGHT URETEROSCOPY HOLMIUM LASER WITH BASKET STONE EXTRACTION AND RIGHT URETERAL STENT PLACEMENT  Anesthesia type: General  Patient location: PACU  Post pain: Pain level controlled  Post assessment: Post-op Vital signs reviewed  Last Vitals:  Filed Vitals:   10/08/11 1900  BP: 156/83  Pulse: 98  Temp:   Resp: 18    Post vital signs: Reviewed  Level of consciousness: sedated  Complications: No apparent anesthesia complications

## 2011-10-12 ENCOUNTER — Encounter (HOSPITAL_BASED_OUTPATIENT_CLINIC_OR_DEPARTMENT_OTHER): Payer: Self-pay | Admitting: Urology

## 2012-01-17 ENCOUNTER — Other Ambulatory Visit: Payer: Self-pay | Admitting: Urology

## 2012-01-19 ENCOUNTER — Encounter (HOSPITAL_COMMUNITY): Payer: Self-pay | Admitting: *Deleted

## 2012-01-19 NOTE — Progress Notes (Signed)
Pt instructed not to take Metformin 01/24/12 before coming to hospital

## 2012-01-19 NOTE — Progress Notes (Signed)
No aspirin products 4 days prior to ESWL and reminded to take laxative 01/23/12  pm

## 2012-01-24 ENCOUNTER — Encounter (HOSPITAL_COMMUNITY): Payer: Self-pay | Admitting: *Deleted

## 2012-01-24 ENCOUNTER — Ambulatory Visit (HOSPITAL_COMMUNITY)
Admission: RE | Admit: 2012-01-24 | Discharge: 2012-01-24 | Disposition: A | Discharge status: Home / Self Care | Payer: Self-pay | Source: Ambulatory Visit | Attending: Urology | Admitting: Urology

## 2012-01-24 ENCOUNTER — Ambulatory Visit (HOSPITAL_COMMUNITY): Payer: Self-pay

## 2012-01-24 ENCOUNTER — Encounter (HOSPITAL_COMMUNITY)
Admission: RE | Disposition: A | Discharge status: Home / Self Care | Payer: Self-pay | Source: Ambulatory Visit | Attending: Urology

## 2012-01-24 DIAGNOSIS — E669 Obesity, unspecified: Secondary | ICD-10-CM | POA: Insufficient documentation

## 2012-01-24 DIAGNOSIS — I10 Essential (primary) hypertension: Secondary | ICD-10-CM | POA: Insufficient documentation

## 2012-01-24 DIAGNOSIS — N201 Calculus of ureter: Secondary | ICD-10-CM | POA: Insufficient documentation

## 2012-01-24 DIAGNOSIS — E119 Type 2 diabetes mellitus without complications: Secondary | ICD-10-CM | POA: Insufficient documentation

## 2012-01-24 HISTORY — DX: Anxiety disorder, unspecified: F41.9

## 2012-01-24 SURGERY — LITHOTRIPSY, ESWL
Anesthesia: LOCAL | Laterality: Right

## 2012-01-24 MED ORDER — DEXTROSE-NACL 5-0.45 % IV SOLN
INTRAVENOUS | Status: DC
  Administered 2012-01-24: 16:00:00 via INTRAVENOUS

## 2012-01-24 MED ORDER — DIAZEPAM 5 MG PO TABS
10.0000 mg | ORAL_TABLET | ORAL | Status: AC
  Administered 2012-01-24: 10 mg via ORAL

## 2012-01-24 MED ORDER — CIPROFLOXACIN HCL 500 MG PO TABS
500.0000 mg | ORAL_TABLET | ORAL | Status: AC
  Administered 2012-01-24: 500 mg via ORAL

## 2012-01-24 MED ORDER — DIPHENHYDRAMINE HCL 25 MG PO CAPS
25.0000 mg | ORAL_CAPSULE | ORAL | Status: AC
  Administered 2012-01-24: 25 mg via ORAL

## 2012-01-24 NOTE — Discharge Instructions (Signed)
Lithotripsy for Kidney Stones  WHAT ARE KIDNEY STONES?  The kidneys filter blood for chemicals the body cannot use. These waste chemicals are eliminated in the urine. They are removed from the body. Under some conditions, these chemicals may become concentrated. When this happens, they form crystals in the urine. When these crystals build up and stick together, stones may form. When these stones block the flow of urine through the urinary tract, they may cause severe pain. The urinary tract is very sensitive to blockage and stretching by the stone.  WHAT IS LITHOTRIPSY?  Lithotripsy is a treatment that can sometimes help eliminate kidney stones and pain faster. A form of lithotripsy, also known as ESWL (extracorporeal shock wave lithotripsy), is a nonsurgical procedure that helps your body rid itself of the kidney stone with a minimum amount of pain. EWSL is a method of crushing a kidney stone with shock waves. These shock waves pass through your body. They cause the kidney stones to crumble while still in the urinary tract. It is then easier for the smaller pieces of stone to pass in the urine.  Lithotripsy usually takes about an hour. It is done in a hospital, a lithotripsy center, or a mobile unit. It usually does not require an overnight stay. Your caregiver will instruct you on preparation for the procedure. Your caregiver will tell you what to expect afterward.  LET YOUR CAREGIVER KNOW ABOUT:   Allergies.    Medicines taken including herbs, eye drops, over the counter medicines (including aspirin, aleve, or motrin for treatment of inflammatory conditions) and creams.    Use of steroids (by mouth or creams).    Previous problems with anesthetics or novocaine.    Possibility of pregnancy, if this applies.    History of blood clots (thrombophlebitis).    History of bleeding or blood problems.    Previous surgery.    Other health problems.   RISKS AND COMPLICATIONS   Complications of lithotripsy are uncommon, but include the following:   Infection.    Bleeding of the kidney.    Bruising of the kidney or skin.    Obstruction of the ureter (the passageway from the kidney to the bladder).    Failure of the stone to fragment (break apart).   PROCEDURE  A stent (flexible tube with holes) may be placed in your ureter. The ureter is the tube that transports the urine from the kidneys to the bladder. Your caregiver may place a stent before the procedure. This will help keep urine flowing from the kidney if the fragments of the stone block the ureter. You may receive an intravenous (IV) line to give you fluids and medicines. These medicines may help you relax or make you sleep. During the procedure, you will lie comfortably on a fluid-filled cushion or in a warm-water bath. After an x-ray or ultrasound locates your stone, shock waves are aimed at the stone. If you are awake, you may feel a tapping sensation (feeling) as the shock waves pass through your body. If large stone particles remain after treatment, a second procedure may be necessary at a later date.  For comfort during the test:   Relax as much as possible.    Try to remain still as much as possible.    Try to follow instructions to speed up the test.    Let your caregiver know if you are uncomfortable, anxious, or in pain.   AFTER THE PROCEDURE    After surgery, you will be   taken to the recovery area. A nurse will watch and check your progress. Once you're awake, stable, and taking fluids well, you will be allowed to go home as long as there are no problems. You may be prescribed antibiotics (medicines that kill germs) to help prevent infection. You may also be prescribed pain medicine if needed. In a week or two, your doctor may remove your stent, if you have one. Your caregiver will check to see whether or not stone particles remain.  PASSING THE STONE   It may take anywhere from a day to several weeks for the stone particles to leave your body. During this time, drink at least 8 to 12 eight ounce glasses of water every day. It is normal for your urine to be cloudy or slightly bloody for a few weeks following this procedure. You may even see small pieces of stone in your urine. A slight fever and some pain are also normal. Your caregiver may ask you to strain your urine to collect some stone particles for chemical analysis. If you find particles while straining the urine, save them. Analysis tells you and the caregiver what the stone is made of. Knowing this may help prevent future stones.  PREVENTING FUTURE STONES   Drink about 8 to 12, eight-ounce glasses of water every day.    Follow the diet your caregiver recommends.    Take your prescribed medicine.    See your caregiver regularly for checkups.   SEEK IMMEDIATE MEDICAL CARE IF:   You develop an oral temperature above 102 F (38.9 C), or as your caregiver suggests.    Your pain is not relieved by medicine.    You develop nausea (feeling sick to your stomach) and vomiting.    You develop heavy bleeding.    You have difficulty urinating.   Document Released: 11/05/2000 Document Revised: 10/28/2011 Document Reviewed: 08/30/2008  [employer] Patient Information 2012 [employer].

## 2012-01-24 NOTE — Op Note (Signed)
Refer to [facility] Op Note scanned in the chart 

## 2012-01-24 NOTE — H&P (Signed)
History of Present Illness  Marilyn Myers returns for follow-up.  She has not had any pain.  She has not passed a stone either.  KUB shows the stone has migrated about 2 cm distally.  Renal ultrasound reveals moderate right hydronephrosis.I discussed treatment options with her: ESL versus repeat ureteroscopy.  She elects to have ureteroscopy.   Past Medical History Problems  1. History of  Diabetes Mellitus 250.00 2. History of  Hypertension 401.9  Surgical History Problems  1. History of  Cystoscopy With Insertion Of Ureteral Stent Bilateral 2. History of  Cystoscopy With Insertion Of Ureteral Stent Bilateral 3. History of  Cystoscopy With Insertion Of Ureteral Stent Right 4. History of  Cystoscopy With Insertion Of Ureteral Stent Right 5. History of  Cystoscopy With Ureteroscopy With Lithotripsy 6. History of  Cystoscopy With Ureteroscopy With Lithotripsy 7. History of  Cystoscopy With Ureteroscopy With Manipulation Of Calculus 8. History of  Lithotripsy  Current Meds 1. Lisinopril 20 MG Oral Tablet; Therapy: (Recorded:14Sep2012) to 2. MetFORMIN HCl 500 MG Oral Tablet; Therapy: (Recorded:14Sep2012) to 3. Rapaflo 8 MG Oral Capsule; Therapy: (Recorded:22Feb2013) to 4. Stool Softener CAPS; Therapy: (Recorded:14Sep2012) to 5. Turmeric CAPS; Therapy: (Recorded:14Sep2012) to  Allergies Medication  1. Codeine Derivatives  Family History Problems  1. Family history of  Family Health Status Number Of Children 2 sons / 1 daugter 2. Family history of  Father Deceased At Age ____ blood clot 3. Family history of  Heart Disease V17.49 4. Family history of  Mother Deceased At Age 15  Social History Problems  1. Alcohol Use occassionaly/ wine 2. Caffeine Use 2 3. Marital History - Currently Married  Review of Systems Genitourinary, constitutional, skin, eye, otolaryngeal, hematologic/lymphatic, cardiovascular, pulmonary, endocrine, musculoskeletal, gastrointestinal, neurological and  psychiatric system(s) were reviewed and pertinent findings if present are noted.    Vitals Vital Signs [Data Includes: Last 1 Day]  22Feb2013 01:25PM  Blood Pressure: 119 / 77 Temperature: 97.8 F Heart Rate: 97  Physical Exam Constitutional: Well nourished and well developed . No acute distress.  ENT:. The ears and nose are normal in appearance.  Neck: The appearance of the neck is normal and no neck mass is present.  Pulmonary: No respiratory distress and normal respiratory rhythm and effort.  Cardiovascular: Heart rate and rhythm are normal . No peripheral edema.  Abdomen: The abdomen is soft and nontender. No masses are palpated. No CVA tenderness. No hernias are palpable. No hepatosplenomegaly noted.  Lymphatics: The femoral and inguinal nodes are not enlarged or tender.  Skin: Normal skin turgor, no visible rash and no visible skin lesions.  Neuro/Psych:. Mood and affect are appropriate.    Results/Data  ULTRASOUND RIGHT KIDNEY INDICATION: R/O hydronephrosis The kidney measures 13.0 x 4.5 x 4.5 cm.  There is moddeate right hydronephrosis. IMPRESSION: Moderate right hydronephrosis.     KUB  INDICATION: Follow-up right ureteral calculi.  KUB shows normal bowel gas pattern. Psoas shadows are normal. The 6 mm right mid ureteral calculus has migrated distally to L5 level. There is also a smaller calcification adjacent to the larger stone fragment.  Amended By: Su Grand; 01/14/2012 3:19 PMEST   Assessment Assessed  1. Ureteral Stone 592.1 2. Hydronephrosis 591  Plan Health Maintenance (V70.0)  1. UA With REFLEX  Done: 22Feb2013 Ureteral Stone (592.1)  2. KUB  Done: 22Feb2013 12:00AM 3. RENAL U/S RIGHT  Done: 22Feb2013 12:00AM   ESL right ureteral calculus. The risks, benefits of the procedure were again discussed with Marilyn Chilton.  The risks include but are not limited to hemorrhage, renal or perirenal hematoma, injury to adjacent organs, steinstrasse.  She understands and  is agreeable.   Signatures Electronically signed by : Su Grand, M.D.; Jan 14 2012  3:21PM

## 2012-01-25 ENCOUNTER — Encounter (HOSPITAL_COMMUNITY): Payer: Self-pay

## 2014-06-10 DIAGNOSIS — E1142 Type 2 diabetes mellitus with diabetic polyneuropathy: Secondary | ICD-10-CM | POA: Insufficient documentation

## 2014-07-26 ENCOUNTER — Emergency Department (HOSPITAL_COMMUNITY): Payer: Medicare Other

## 2014-07-26 ENCOUNTER — Encounter (HOSPITAL_COMMUNITY): Payer: Self-pay | Admitting: Emergency Medicine

## 2014-07-26 ENCOUNTER — Emergency Department (HOSPITAL_COMMUNITY)
Admission: EM | Admit: 2014-07-26 | Discharge: 2014-07-26 | Disposition: A | Discharge status: Home / Self Care | Payer: Medicare Other | Attending: Emergency Medicine | Admitting: Emergency Medicine

## 2014-07-26 DIAGNOSIS — I129 Hypertensive chronic kidney disease with stage 1 through stage 4 chronic kidney disease, or unspecified chronic kidney disease: Secondary | ICD-10-CM | POA: Insufficient documentation

## 2014-07-26 DIAGNOSIS — R1032 Left lower quadrant pain: Secondary | ICD-10-CM | POA: Diagnosis present

## 2014-07-26 DIAGNOSIS — IMO0002 Reserved for concepts with insufficient information to code with codable children: Secondary | ICD-10-CM | POA: Diagnosis not present

## 2014-07-26 DIAGNOSIS — Z8659 Personal history of other mental and behavioral disorders: Secondary | ICD-10-CM | POA: Diagnosis not present

## 2014-07-26 DIAGNOSIS — E119 Type 2 diabetes mellitus without complications: Secondary | ICD-10-CM | POA: Insufficient documentation

## 2014-07-26 DIAGNOSIS — Z7982 Long term (current) use of aspirin: Secondary | ICD-10-CM | POA: Diagnosis not present

## 2014-07-26 DIAGNOSIS — Z87891 Personal history of nicotine dependence: Secondary | ICD-10-CM | POA: Diagnosis not present

## 2014-07-26 DIAGNOSIS — N189 Chronic kidney disease, unspecified: Secondary | ICD-10-CM | POA: Insufficient documentation

## 2014-07-26 DIAGNOSIS — L089 Local infection of the skin and subcutaneous tissue, unspecified: Secondary | ICD-10-CM | POA: Diagnosis not present

## 2014-07-26 DIAGNOSIS — K5732 Diverticulitis of large intestine without perforation or abscess without bleeding: Secondary | ICD-10-CM

## 2014-07-26 DIAGNOSIS — N201 Calculus of ureter: Secondary | ICD-10-CM | POA: Diagnosis not present

## 2014-07-26 DIAGNOSIS — Z79899 Other long term (current) drug therapy: Secondary | ICD-10-CM | POA: Diagnosis not present

## 2014-07-26 DIAGNOSIS — N1330 Unspecified hydronephrosis: Secondary | ICD-10-CM | POA: Insufficient documentation

## 2014-07-26 DIAGNOSIS — Z8709 Personal history of other diseases of the respiratory system: Secondary | ICD-10-CM | POA: Insufficient documentation

## 2014-07-26 LAB — URINALYSIS, ROUTINE W REFLEX MICROSCOPIC
Bilirubin Urine: NEGATIVE
Glucose, UA: NEGATIVE mg/dL
KETONES UR: 15 mg/dL — AB
Nitrite: NEGATIVE
PROTEIN: 30 mg/dL — AB
Specific Gravity, Urine: 1.021 (ref 1.005–1.030)
UROBILINOGEN UA: 0.2 mg/dL (ref 0.0–1.0)
pH: 5 (ref 5.0–8.0)

## 2014-07-26 LAB — CBC WITH DIFFERENTIAL/PLATELET
Basophils Absolute: 0 10*3/uL (ref 0.0–0.1)
Basophils Relative: 0 % (ref 0–1)
EOS ABS: 0.1 10*3/uL (ref 0.0–0.7)
EOS PCT: 1 % (ref 0–5)
HEMATOCRIT: 44.1 % (ref 36.0–46.0)
HEMOGLOBIN: 14.5 g/dL (ref 12.0–15.0)
LYMPHS ABS: 1.8 10*3/uL (ref 0.7–4.0)
Lymphocytes Relative: 12 % (ref 12–46)
MCH: 30.5 pg (ref 26.0–34.0)
MCHC: 32.9 g/dL (ref 30.0–36.0)
MCV: 92.6 fL (ref 78.0–100.0)
MONO ABS: 0.8 10*3/uL (ref 0.1–1.0)
MONOS PCT: 5 % (ref 3–12)
Neutro Abs: 12 10*3/uL — ABNORMAL HIGH (ref 1.7–7.7)
Neutrophils Relative %: 82 % — ABNORMAL HIGH (ref 43–77)
Platelets: 275 10*3/uL (ref 150–400)
RBC: 4.76 MIL/uL (ref 3.87–5.11)
RDW: 14.2 % (ref 11.5–15.5)
WBC: 14.7 10*3/uL — ABNORMAL HIGH (ref 4.0–10.5)

## 2014-07-26 LAB — URINE MICROSCOPIC-ADD ON

## 2014-07-26 LAB — COMPREHENSIVE METABOLIC PANEL
ALT: 27 U/L (ref 0–35)
ANION GAP: 19 — AB (ref 5–15)
AST: 22 U/L (ref 0–37)
Albumin: 4.3 g/dL (ref 3.5–5.2)
Alkaline Phosphatase: 87 U/L (ref 39–117)
BUN: 20 mg/dL (ref 6–23)
CALCIUM: 9.8 mg/dL (ref 8.4–10.5)
CO2: 21 meq/L (ref 19–32)
Chloride: 98 mEq/L (ref 96–112)
Creatinine, Ser: 0.89 mg/dL (ref 0.50–1.10)
GFR, EST AFRICAN AMERICAN: 77 mL/min — AB (ref >90)
GFR, EST NON AFRICAN AMERICAN: 67 mL/min — AB (ref >90)
GLUCOSE: 189 mg/dL — AB (ref 70–99)
Potassium: 4 mEq/L (ref 3.7–5.3)
Sodium: 138 mEq/L (ref 137–147)
TOTAL PROTEIN: 8.6 g/dL — AB (ref 6.0–8.3)
Total Bilirubin: 1 mg/dL (ref 0.3–1.2)

## 2014-07-26 LAB — LIPASE, BLOOD: Lipase: 24 U/L (ref 11–59)

## 2014-07-26 MED ORDER — METRONIDAZOLE 500 MG PO TABS
500.0000 mg | ORAL_TABLET | Freq: Three times a day (TID) | ORAL | Status: DC
Start: 2014-07-26 — End: 2014-10-08

## 2014-07-26 MED ORDER — HYDROMORPHONE HCL PF 1 MG/ML IJ SOLN
1.0000 mg | INTRAMUSCULAR | Status: DC | PRN
  Administered 2014-07-26 (×2): 1 mg via INTRAVENOUS
  Filled 2014-07-26 (×2): qty 1

## 2014-07-26 MED ORDER — ONDANSETRON HCL 4 MG/2ML IJ SOLN
4.0000 mg | Freq: Once | INTRAMUSCULAR | Status: AC
  Administered 2014-07-26: 4 mg via INTRAVENOUS
  Filled 2014-07-26: qty 2

## 2014-07-26 MED ORDER — CIPROFLOXACIN IN D5W 400 MG/200ML IV SOLN
400.0000 mg | Freq: Two times a day (BID) | INTRAVENOUS | Status: DC
  Administered 2014-07-26: 400 mg via INTRAVENOUS
  Filled 2014-07-26: qty 200

## 2014-07-26 MED ORDER — ONDANSETRON 8 MG PO TBDP: 8.0000 mg | ORAL_TABLET | Freq: Three times a day (TID) | ORAL | Status: DC | PRN

## 2014-07-26 MED ORDER — OXYCODONE-ACETAMINOPHEN 5-325 MG PO TABS: 1.0000 | ORAL_TABLET | Freq: Four times a day (QID) | ORAL | Status: DC | PRN

## 2014-07-26 MED ORDER — SODIUM CHLORIDE 0.9 % IV SOLN
1000.0000 mL | Freq: Once | INTRAVENOUS | Status: AC
  Administered 2014-07-26: 1000 mL via INTRAVENOUS

## 2014-07-26 MED ORDER — IOHEXOL 300 MG/ML  SOLN
50.0000 mL | Freq: Once | INTRAMUSCULAR | Status: AC | PRN
  Administered 2014-07-26: 50 mL via ORAL

## 2014-07-26 MED ORDER — ONDANSETRON HCL 4 MG/2ML IJ SOLN: 4.0000 mg | Freq: Once | INTRAMUSCULAR | Status: AC

## 2014-07-26 MED ORDER — METRONIDAZOLE 500 MG PO TABS
500.0000 mg | ORAL_TABLET | Freq: Once | ORAL | Status: AC
  Administered 2014-07-26: 500 mg via ORAL
  Filled 2014-07-26: qty 1

## 2014-07-26 MED ORDER — IOHEXOL 300 MG/ML  SOLN
100.0000 mL | Freq: Once | INTRAMUSCULAR | Status: AC | PRN
  Administered 2014-07-26: 100 mL via INTRAVENOUS

## 2014-07-26 MED ORDER — CIPROFLOXACIN HCL 500 MG PO TABS: 500.0000 mg | ORAL_TABLET | Freq: Two times a day (BID) | ORAL | Status: DC

## 2014-07-26 MED ORDER — SODIUM CHLORIDE 0.9 % IV SOLN
1000.0000 mL | INTRAVENOUS | Status: DC
  Administered 2014-07-26: 1000 mL via INTRAVENOUS

## 2014-07-26 NOTE — Progress Notes (Signed)
  CARE MANAGEMENT ED NOTE 07/26/2014  Patient:  Marilyn Myers,Marilyn Myers   Account Number:  [id number]  Date Initiated:  07/26/2014  Documentation initiated by:  GIBBS,KIMBERLY  Subjective/Objective Assessment:   67 medicare Myers/o lower abd.pain, n/v since last night.     Subjective/Objective Assessment Detail:   Pt informed Cm she had an new pt appointment to see Dr Jonni Sanger "today"     Action/Plan:   EPIC updated   Action/Plan Detail:   Anticipated DC Date:       Status Recommendation to Physician:   Result of Recommendation:    Other ED Services  Consult Working [place]  Other  Outpatient Services - Pt will follow up  PCP issues    Choice offered to / List presented to:            Status of service:  Completed, signed off  ED Comments:   ED Comments Detail:

## 2014-07-26 NOTE — ED Notes (Signed)
Pt c/o lower abd.pain, n/v since last night.

## 2014-07-26 NOTE — ED Notes (Signed)
Unable to urinate at this time.  

## 2014-07-26 NOTE — ED Notes (Signed)
Pharmacy tech in room. Will try to collect UA when the exit room.

## 2014-07-26 NOTE — ED Provider Notes (Signed)
CSN: [id number]     Arrival date & time 07/26/14  1002 History   First MD Initiated Contact with Patient 07/26/14 1021     Chief Complaint  Patient presents with  . Abdominal Pain    Patient is a 63 y.o. female presenting with abdominal pain. The history is provided by the patient.  Abdominal Pain Pain location:  LLQ Pain quality: cramping   Pain severity:  Severe Onset quality:  Sudden Duration:  10 hours Timing:  Constant Relieved by:  Nothing Exacerbated by: walking and tyring to stand up straight. Ineffective treatments:  None tried Associated symptoms: vomiting   Associated symptoms: no diarrhea, no dysuria, no fever and no nausea    patient denies any history of having recurrent abdominal pain problems. She has noticed over the last couple of months she has been having recurrent skin infections in her abdominal area.  She will get small abscess on her skin surface that will burst. She noticed when a few days ago in her left lower abdomen but it's not causing any particular pain or discomfort. She has not had any fevers.  Past Medical History  Diagnosis Date  . Hypertension 10-08-11  . Diabetes mellitus 10-08-11  . Anxiety   . Recurrent upper respiratory infection (URI)     has had a recent cold. Will call Dr. Janice Norrie if   . Chronic kidney disease     right ureteral calculus   Past Surgical History  Procedure Laterality Date  . Cervical cone biopsy  1976  . Stone extraction with basket      sept   2012       aug  2012  . Extracorporeal shock wave lithotripsy  10-07-11    right  . Cystoscopy w/ ureteral stent placement  aug 2012  . Tubal ligation  1987  . Cystoscopy/retrograde/ureteroscopy  10/08/2011    Procedure: CYSTOSCOPY/RETROGRADE/URETEROSCOPY;  Surgeon: Hanley Ben, MD;  Location: [facility];  Service: Urology;  Laterality: N/A;  CYSTOSCOPY, RIGHT  RETROGRADE, RIGHT URETEROSCOPY HOLMIUM LASER WITH BASKET STONE EXTRACTION AND RIGHT URETERAL STENT  PLACEMENT   No family history on file. History  Substance Use Topics  . Smoking status: Former Smoker    Quit date: 10/07/1996  . Smokeless tobacco: Never Used  . Alcohol Use: Yes     Comment: occas   OB History   Grav Para Term Preterm Abortions TAB SAB Ect Mult Living                 Review of Systems  Constitutional: Negative for fever.  Gastrointestinal: Positive for vomiting and abdominal pain. Negative for nausea and diarrhea.  Genitourinary: Negative for dysuria.  All other systems reviewed and are negative.     Allergies  Codeine  Home Medications   Prior to Admission medications   Medication Sig Start Date End Date Taking? Authorizing Provider  aspirin 81 MG tablet Take 81 mg by mouth daily.   Yes Historical Provider, MD  Cholecalciferol (VITAMIN D-3 PO) Take 1 tablet by mouth daily.   Yes Historical Provider, MD  co-enzyme Q-10 30 MG capsule Take 30 mg by mouth 3 (three) times daily.   Yes Historical Provider, MD  Coenzyme Q10 (COQ10 PO) Take 1 tablet by mouth daily.   Yes Historical Provider, MD  Cyanocobalamin (VITAMIN B-12 PO) Take 1 tablet by mouth daily.   Yes Historical Provider, MD  docusate sodium (COLACE) 100 MG capsule Take 100 mg by mouth daily as needed for mild  constipation.   Yes Historical Provider, MD  losartan (COZAAR) 100 MG tablet Take 100 mg by mouth daily.   Yes Historical Provider, MD  metFORMIN (GLUCOPHAGE) 500 MG tablet Take 1,000 mg by mouth 2 (two) times daily with a meal.    Yes Historical Provider, MD  ondansetron (ZOFRAN ODT) 8 MG disintegrating tablet Take 1 tablet (8 mg total) by mouth every 8 (eight) hours as needed for nausea or vomiting. 07/26/14   Dorie Rank, MD  oxyCODONE-acetaminophen (PERCOCET/ROXICET) 5-325 MG per tablet Take 1-2 tablets by mouth every 6 (six) hours as needed. 07/26/14   Dorie Rank, MD  oxyCODONE-acetaminophen (PERCOCET/ROXICET) 5-325 MG per tablet Take 1-2 tablets by mouth every 6 (six) hours as needed. 07/26/14    Dorie Rank, MD   BP 151/90  Pulse 118  Temp(Src) 99.9 F (37.7 C) (Oral)  Resp 20  SpO2 92% Physical Exam  Nursing note and vitals reviewed. Constitutional: No distress.  HENT:  Head: Normocephalic and atraumatic.  Right Ear: External ear normal.  Left Ear: External ear normal.  Eyes: Conjunctivae are normal. Right eye exhibits no discharge. Left eye exhibits no discharge. No scleral icterus.  Neck: Neck supple. No tracheal deviation present.  Cardiovascular: Normal rate, regular rhythm and intact distal pulses.   Pulmonary/Chest: Effort normal and breath sounds normal. No stridor. No respiratory distress. She has no wheezes. She has no rales.  Abdominal: Soft. Bowel sounds are normal. She exhibits no distension and no mass. There is tenderness in the left lower quadrant. There is no rigidity, no rebound, no guarding and no CVA tenderness. No hernia.  Small 1 cm area of superficial pustule without mild erythema surrounding it, no induration  Musculoskeletal: She exhibits no edema and no tenderness.  Neurological: She is alert. She has normal strength. No cranial nerve deficit (no facial droop, extraocular movements intact, no slurred speech) or sensory deficit. She exhibits normal muscle tone. She displays no seizure activity. Coordination normal.  Skin: Skin is warm and dry. No rash noted.  Psychiatric: She has a normal mood and affect.    ED Course  Procedures (including critical care time) Labs Review Labs Reviewed  CBC WITH DIFFERENTIAL - Abnormal; Notable for the following:    WBC 14.7 (*)    Neutrophils Relative % 82 (*)    Neutro Abs 12.0 (*)    All other components within normal limits  COMPREHENSIVE METABOLIC PANEL - Abnormal; Notable for the following:    Glucose, Bld 189 (*)    Total Protein 8.6 (*)    GFR calc non Af Amer 67 (*)    GFR calc Af Amer 77 (*)    Anion gap 19 (*)    All other components within normal limits  URINALYSIS, ROUTINE W REFLEX MICROSCOPIC -  Abnormal; Notable for the following:    APPearance CLOUDY (*)    Hgb urine dipstick TRACE (*)    Ketones, ur 15 (*)    Protein, ur 30 (*)    Leukocytes, UA LARGE (*)    All other components within normal limits  URINE MICROSCOPIC-ADD ON - Abnormal; Notable for the following:    Squamous Epithelial / LPF MANY (*)    Bacteria, UA MANY (*)    Casts HYALINE CASTS (*)    All other components within normal limits  LIPASE, BLOOD    Imaging Review Ct Abdomen Pelvis W Contrast  07/26/2014   CLINICAL DATA:  Abdominal pain, nausea, vomiting.  EXAM: CT ABDOMEN AND PELVIS WITH CONTRAST  TECHNIQUE: Multidetector CT imaging of the abdomen and pelvis was performed using the standard protocol following bolus administration of intravenous contrast.  CONTRAST:  115mL OMNIPAQUE IOHEXOL 300 MG/ML SOLN, 73mL OMNIPAQUE IOHEXOL 300 MG/ML SOLN  COMPARISON:  07/10/2011  FINDINGS: Lung bases are clear.  No effusions.  Heart is normal size.  Liver, gallbladder, spleen, pancreas and adrenals are unremarkable.  Mild right hydronephrosis due to 3 mm mid right ureteral stone just above the pelvic brim. Bilateral nephrolithiasis. Bilateral renal cysts.  Sigmoid diverticulosis. Stranding noted around the sigmoid colon compatible with active diverticulitis. Small amount of free fluid in the cul-de-sac. Uterus, adnexae and urinary bladder are unremarkable.  Stomach and small bowel are unremarkable. Aorta is normal caliber. No adenopathy.  No acute bony abnormality or focal bone lesion.  IMPRESSION: Mild right hydronephrosis with 3 mm mid right ureteral stone.  Sigmoid diverticulosis with inflammatory changes around the sigmoid colon compatible with active diverticulitis.  Bilateral nephrolithiasis.   Electronically Signed   By: Rolm Baptise M.D.   On: 07/26/2014 14:00    Medications  0.9 %  sodium chloride infusion (0 mLs Intravenous Stopped 07/26/14 1423)    Followed by  0.9 %  sodium chloride infusion (1,000 mLs Intravenous New  Bag/Given 07/26/14 1423)  HYDROmorphone (DILAUDID) injection 1 mg (1 mg Intravenous Given 07/26/14 1422)  ciprofloxacin (CIPRO) IVPB 400 mg (not administered)  metroNIDAZOLE (FLAGYL) tablet 500 mg (not administered)  ondansetron (ZOFRAN) injection 4 mg (4 mg Intravenous Given 07/26/14 1109)  ondansetron (ZOFRAN) injection 4 mg (0 mg Intravenous Duplicate 07/27/75 5465)  iohexol (OMNIPAQUE) 300 MG/ML solution 50 mL (50 mLs Oral Contrast Given 07/26/14 1338)  iohexol (OMNIPAQUE) 300 MG/ML solution 100 mL (100 mLs Intravenous Contrast Given 07/26/14 1338)     MDM   Final diagnoses:  Diverticulitis of large intestine without perforation or abscess without bleeding  Ureteral stone    Patient's CT scan interestingly shows both diverticulitis and a right-sided ureteral stone with hydronephrosis patient's pain was managed in the emergency department.  I discussed the findings and treatment options. The patient would prefer to go home. She'll be given a dose of IV Cipro. Plan on discharge home with antibiotics and close follow up as an outpatient.    Dorie Rank, MD 07/26/14 [phone]

## 2014-07-26 NOTE — Discharge Instructions (Signed)
Diverticulitis Diverticulitis is inflammation or infection of small pouches in your colon that form when you have a condition called diverticulosis. The pouches in your colon are called diverticula. Your colon, or large intestine, is where water is absorbed and stool is formed. Complications of diverticulitis can include:  Bleeding.  Severe infection.  Severe pain.  Perforation of your colon.  Obstruction of your colon. CAUSES  Diverticulitis is caused by bacteria. Diverticulitis happens when stool becomes trapped in diverticula. This allows bacteria to grow in the diverticula, which can lead to inflammation and infection. RISK FACTORS People with diverticulosis are at risk for diverticulitis. Eating a diet that does not include enough fiber from fruits and vegetables may make diverticulitis more likely to develop. SYMPTOMS  Symptoms of diverticulitis may include:  Abdominal pain and tenderness. The pain is normally located on the left side of the abdomen, but may occur in other areas.  Fever and chills.  Bloating.  Cramping.  Nausea.  Vomiting.  Constipation.  Diarrhea.  Blood in your stool. DIAGNOSIS  Your health care provider will ask you about your medical history and do a physical exam. You may need to have tests done because many medical conditions can cause the same symptoms as diverticulitis. Tests may include:  Blood tests.  Urine tests.  Imaging tests of the abdomen, including X-rays and CT scans. When your condition is under control, your health care provider may recommend that you have a colonoscopy. A colonoscopy can show how severe your diverticula are and whether something else is causing your symptoms. TREATMENT  Most cases of diverticulitis are mild and can be treated at home. Treatment may include:  Taking over-the-counter pain medicines.  Following a clear liquid diet.  Taking antibiotic medicines by mouth for 7-10 days. More severe cases may  be treated at a hospital. Treatment may include:  Not eating or drinking.  Taking prescription pain medicine.  Receiving antibiotic medicines through an IV tube.  Receiving fluids and nutrition through an IV tube.  Surgery. HOME CARE INSTRUCTIONS   Follow your health care provider's instructions carefully.  Follow a full liquid diet or other diet as directed by your health care provider. After your symptoms improve, your health care provider may tell you to change your diet. He or she may recommend you eat a high-fiber diet. Fruits and vegetables are good sources of fiber. Fiber makes it easier to pass stool.  Take fiber supplements or probiotics as directed by your health care provider.  Only take medicines as directed by your health care provider.  Keep all your follow-up appointments. SEEK MEDICAL CARE IF:   Your pain does not improve.  You have a hard time eating food.  Your bowel movements do not return to normal. SEEK IMMEDIATE MEDICAL CARE IF:   Your pain becomes worse.  Your symptoms do not get better.  Your symptoms suddenly get worse.  You have a fever.  You have repeated vomiting.  You have bloody or black, tarry stools. MAKE SURE YOU:   Understand these instructions.  Will watch your condition.  Will get help right away if you are not doing well or get worse. Document Released: 08/18/2005 Document Revised: 11/13/2013 Document Reviewed: 10/03/2013 [facility] Patient Information 2015 [employer], [place]. This information is not intended to replace advice given to you by your health care provider. Make sure you discuss any questions you have with your health care provider.  Kidney Stones Kidney stones (urolithiasis) are deposits that form inside your kidneys. The  intense pain is caused by the stone moving through the urinary tract. When the stone moves, the ureter goes into spasm around the stone. The stone is usually passed in the urine.  CAUSES   A  disorder that makes certain neck glands produce too much parathyroid hormone (primary hyperparathyroidism).  A buildup of uric acid crystals, similar to gout in your joints.  Narrowing (stricture) of the ureter.  A kidney obstruction present at birth (congenital obstruction).  Previous surgery on the kidney or ureters.  Numerous kidney infections. SYMPTOMS   Feeling sick to your stomach (nauseous).  Throwing up (vomiting).  Blood in the urine (hematuria).  Pain that usually spreads (radiates) to the groin.  Frequency or urgency of urination. DIAGNOSIS   Taking a history and physical exam.  Blood or urine tests.  CT scan.  Occasionally, an examination of the inside of the urinary bladder (cystoscopy) is performed. TREATMENT   Observation.  Increasing your fluid intake.  Extracorporeal shock wave lithotripsy--This is a noninvasive procedure that uses shock waves to break up kidney stones.  Surgery may be needed if you have severe pain or persistent obstruction. There are various surgical procedures. Most of the procedures are performed with the use of small instruments. Only small incisions are needed to accommodate these instruments, so recovery time is minimized. The size, location, and chemical composition are all important variables that will determine the proper choice of action for you. Talk to your health care provider to better understand your situation so that you will minimize the risk of injury to yourself and your kidney.  HOME CARE INSTRUCTIONS   Drink enough water and fluids to keep your urine clear or pale yellow. This will help you to pass the stone or stone fragments.  Strain all urine through the provided strainer. Keep all particulate matter and stones for your health care provider to see. The stone causing the pain may be as small as a grain of salt. It is very important to use the strainer each and every time you pass your urine. The collection of your  stone will allow your health care provider to analyze it and verify that a stone has actually passed. The stone analysis will often identify what you can do to reduce the incidence of recurrences.  Only take over-the-counter or prescription medicines for pain, discomfort, or fever as directed by your health care provider.  Make a follow-up appointment with your health care provider as directed.  Get follow-up X-rays if required. The absence of pain does not always mean that the stone has passed. It may have only stopped moving. If the urine remains completely obstructed, it can cause loss of kidney function or even complete destruction of the kidney. It is your responsibility to make sure X-rays and follow-ups are completed. Ultrasounds of the kidney can show blockages and the status of the kidney. Ultrasounds are not associated with any radiation and can be performed easily in a matter of minutes. SEEK MEDICAL CARE IF:  You experience pain that is progressive and unresponsive to any pain medicine you have been prescribed. SEEK IMMEDIATE MEDICAL CARE IF:   Pain cannot be controlled with the prescribed medicine.  You have a fever or shaking chills.  The severity or intensity of pain increases over 18 hours and is not relieved by pain medicine.  You develop a new onset of abdominal pain.  You feel faint or pass out.  You are unable to urinate. MAKE SURE YOU:   Understand  these instructions.  Will watch your condition.  Will get help right away if you are not doing well or get worse. Document Released: 11/08/2005 Document Revised: 07/11/2013 Document Reviewed: 04/11/2013 [facility] Patient Information 2015 [place], [place]. This information is not intended to replace advice given to you by your health care provider. Make sure you discuss any questions you have with your health care provider.

## 2014-08-01 DIAGNOSIS — G4733 Obstructive sleep apnea (adult) (pediatric): Secondary | ICD-10-CM | POA: Insufficient documentation

## 2014-08-19 DIAGNOSIS — L732 Hidradenitis suppurativa: Secondary | ICD-10-CM | POA: Insufficient documentation

## 2014-08-19 DIAGNOSIS — K5732 Diverticulitis of large intestine without perforation or abscess without bleeding: Secondary | ICD-10-CM | POA: Insufficient documentation

## 2014-08-19 DIAGNOSIS — N200 Calculus of kidney: Secondary | ICD-10-CM | POA: Insufficient documentation

## 2014-10-08 ENCOUNTER — Other Ambulatory Visit: Payer: Self-pay | Admitting: Urology

## 2014-10-08 ENCOUNTER — Encounter (HOSPITAL_BASED_OUTPATIENT_CLINIC_OR_DEPARTMENT_OTHER): Payer: Self-pay | Admitting: *Deleted

## 2014-10-08 NOTE — Progress Notes (Signed)
NPO AFTER MN WITH EXCEPTION LIQUIDS UNTIL 0730 (NO CREAM/ MILK PRODUCTS). ARRIVE AT 1215.  NEEDS ISTAT AND EKG. WILL TAKE PROTONIX AND COZAAR AM DOS W/ SIPS OF WATER.

## 2014-10-11 ENCOUNTER — Ambulatory Visit (HOSPITAL_BASED_OUTPATIENT_CLINIC_OR_DEPARTMENT_OTHER): Payer: Medicare Other | Admitting: Anesthesiology

## 2014-10-11 ENCOUNTER — Encounter (HOSPITAL_BASED_OUTPATIENT_CLINIC_OR_DEPARTMENT_OTHER): Payer: Self-pay | Admitting: *Deleted

## 2014-10-11 ENCOUNTER — Encounter (HOSPITAL_BASED_OUTPATIENT_CLINIC_OR_DEPARTMENT_OTHER)
Admission: RE | Disposition: A | Discharge status: Home / Self Care | Payer: Self-pay | Source: Ambulatory Visit | Attending: Urology

## 2014-10-11 ENCOUNTER — Ambulatory Visit (HOSPITAL_BASED_OUTPATIENT_CLINIC_OR_DEPARTMENT_OTHER)
Admission: RE | Admit: 2014-10-11 | Discharge: 2014-10-11 | Disposition: A | Discharge status: Home / Self Care | Payer: Medicare Other | Source: Ambulatory Visit | Attending: Urology | Admitting: Urology

## 2014-10-11 DIAGNOSIS — Z87891 Personal history of nicotine dependence: Secondary | ICD-10-CM | POA: Insufficient documentation

## 2014-10-11 DIAGNOSIS — I10 Essential (primary) hypertension: Secondary | ICD-10-CM | POA: Insufficient documentation

## 2014-10-11 DIAGNOSIS — Z79899 Other long term (current) drug therapy: Secondary | ICD-10-CM | POA: Diagnosis not present

## 2014-10-11 DIAGNOSIS — Z885 Allergy status to narcotic agent status: Secondary | ICD-10-CM | POA: Insufficient documentation

## 2014-10-11 DIAGNOSIS — E119 Type 2 diabetes mellitus without complications: Secondary | ICD-10-CM | POA: Insufficient documentation

## 2014-10-11 DIAGNOSIS — E118 Type 2 diabetes mellitus with unspecified complications: Secondary | ICD-10-CM | POA: Insufficient documentation

## 2014-10-11 DIAGNOSIS — G4730 Sleep apnea, unspecified: Secondary | ICD-10-CM | POA: Insufficient documentation

## 2014-10-11 DIAGNOSIS — N132 Hydronephrosis with renal and ureteral calculous obstruction: Secondary | ICD-10-CM | POA: Insufficient documentation

## 2014-10-11 DIAGNOSIS — N201 Calculus of ureter: Secondary | ICD-10-CM | POA: Diagnosis present

## 2014-10-11 HISTORY — DX: Gastro-esophageal reflux disease without esophagitis: K21.9

## 2014-10-11 HISTORY — DX: Dependence on other enabling machines and devices: Z99.89

## 2014-10-11 HISTORY — DX: Calculus of ureter: N20.1

## 2014-10-11 HISTORY — DX: Calculus of kidney: N20.0

## 2014-10-11 HISTORY — DX: Obstructive sleep apnea (adult) (pediatric): G47.33

## 2014-10-11 HISTORY — DX: Type 2 diabetes mellitus without complications: E11.9

## 2014-10-11 HISTORY — DX: Personal history of urinary calculi: Z87.442

## 2014-10-11 HISTORY — PX: CYSTOSCOPY WITH RETROGRADE PYELOGRAM, URETEROSCOPY AND STENT PLACEMENT: SHX5789

## 2014-10-11 HISTORY — DX: Diverticulitis of intestine, part unspecified, without perforation or abscess without bleeding: K57.92

## 2014-10-11 HISTORY — DX: Diverticulosis of large intestine without perforation or abscess without bleeding: K57.30

## 2014-10-11 LAB — POCT I-STAT 4, (NA,K, GLUC, HGB,HCT)
GLUCOSE: 129 mg/dL — AB (ref 70–99)
HCT: 38 % (ref 36.0–46.0)
Hemoglobin: 12.9 g/dL (ref 12.0–15.0)
POTASSIUM: 4 meq/L (ref 3.7–5.3)
Sodium: 141 mEq/L (ref 137–147)

## 2014-10-11 LAB — GLUCOSE, CAPILLARY: Glucose-Capillary: 113 mg/dL — ABNORMAL HIGH (ref 70–99)

## 2014-10-11 SURGERY — CYSTOURETEROSCOPY, WITH RETROGRADE PYELOGRAM AND STENT INSERTION
Anesthesia: General | Site: Ureter | Laterality: Right

## 2014-10-11 MED ORDER — MIDAZOLAM HCL 5 MG/5ML IJ SOLN
INTRAMUSCULAR | Status: DC | PRN
  Administered 2014-10-11: 2 mg via INTRAVENOUS

## 2014-10-11 MED ORDER — LIDOCAINE HCL (CARDIAC) 20 MG/ML IV SOLN
INTRAVENOUS | Status: DC | PRN
  Administered 2014-10-11: 100 mg via INTRAVENOUS

## 2014-10-11 MED ORDER — LACTATED RINGERS IV SOLN
INTRAVENOUS | Status: DC
  Administered 2014-10-11 (×2): via INTRAVENOUS
  Filled 2014-10-11: qty 1000

## 2014-10-11 MED ORDER — EPHEDRINE SULFATE 50 MG/ML IJ SOLN
INTRAMUSCULAR | Status: DC | PRN
  Administered 2014-10-11: 10 mg via INTRAVENOUS

## 2014-10-11 MED ORDER — CEFAZOLIN SODIUM-DEXTROSE 2-3 GM-% IV SOLR
INTRAVENOUS | Status: AC
  Filled 2014-10-11: qty 50

## 2014-10-11 MED ORDER — PROPOFOL 10 MG/ML IV BOLUS
INTRAVENOUS | Status: DC | PRN
  Administered 2014-10-11: 200 mg via INTRAVENOUS

## 2014-10-11 MED ORDER — CEFAZOLIN SODIUM 1-5 GM-% IV SOLN
1.0000 g | INTRAVENOUS | Status: DC
  Filled 2014-10-11: qty 50

## 2014-10-11 MED ORDER — CEFAZOLIN SODIUM-DEXTROSE 2-3 GM-% IV SOLR
2.0000 g | INTRAVENOUS | Status: AC
  Administered 2014-10-11: 2 g via INTRAVENOUS
  Filled 2014-10-11: qty 50

## 2014-10-11 MED ORDER — FENTANYL CITRATE 0.05 MG/ML IJ SOLN
INTRAMUSCULAR | Status: DC | PRN
  Administered 2014-10-11: 100 ug via INTRAVENOUS

## 2014-10-11 MED ORDER — ACETAMINOPHEN 10 MG/ML IV SOLN
INTRAVENOUS | Status: DC | PRN
  Administered 2014-10-11: 1000 mg via INTRAVENOUS

## 2014-10-11 MED ORDER — DEXAMETHASONE SODIUM PHOSPHATE 4 MG/ML IJ SOLN
INTRAMUSCULAR | Status: DC | PRN
  Administered 2014-10-11: 8 mg via INTRAVENOUS

## 2014-10-11 MED ORDER — ONDANSETRON HCL 4 MG/2ML IJ SOLN
INTRAMUSCULAR | Status: DC | PRN
  Administered 2014-10-11: 4 mg via INTRAVENOUS

## 2014-10-11 MED ORDER — MIDAZOLAM HCL 2 MG/2ML IJ SOLN
INTRAMUSCULAR | Status: AC
  Filled 2014-10-11: qty 2

## 2014-10-11 MED ORDER — PHENYLEPHRINE HCL 10 MG/ML IJ SOLN
INTRAMUSCULAR | Status: DC | PRN
  Administered 2014-10-11 (×3): 80 ug via INTRAVENOUS

## 2014-10-11 MED ORDER — FENTANYL CITRATE 0.05 MG/ML IJ SOLN
INTRAMUSCULAR | Status: AC
  Filled 2014-10-11: qty 4

## 2014-10-11 MED ORDER — SODIUM CHLORIDE 0.9 % IR SOLN
Status: DC | PRN
  Administered 2014-10-11: 6000 mL

## 2014-10-11 SURGICAL SUPPLY — 29 items
ADAPTER CATH URET PLST 4-6FR (CATHETERS) IMPLANT
BAG DRAIN URO-CYSTO SKYTR STRL (DRAIN) ×4 IMPLANT
BASKET LASER NITINOL 1.9FR (BASKET) IMPLANT
BASKET STNLS GEMINI 4WIRE 3FR (BASKET) IMPLANT
BASKET ZERO TIP NITINOL 2.4FR (BASKET) ×4 IMPLANT
CANISTER SUCT LVC 12 LTR MEDI- (MISCELLANEOUS) ×4 IMPLANT
CATH INTERMIT  6FR 70CM (CATHETERS) IMPLANT
CATH URET 5FR 28IN CONE TIP (BALLOONS) ×2
CATH URET 5FR 28IN OPEN ENDED (CATHETERS) IMPLANT
CATH URET 5FR 70CM CONE TIP (BALLOONS) ×2 IMPLANT
CLOTH BEACON ORANGE TIMEOUT ST (SAFETY) ×4 IMPLANT
DRAPE CAMERA CLOSED 9X96 (DRAPES) ×4 IMPLANT
FIBER LASER FLEXIVA 1000 (UROLOGICAL SUPPLIES) IMPLANT
FIBER LASER FLEXIVA 200 (UROLOGICAL SUPPLIES) IMPLANT
FIBER LASER FLEXIVA 365 (UROLOGICAL SUPPLIES) IMPLANT
FIBER LASER FLEXIVA 550 (UROLOGICAL SUPPLIES) IMPLANT
GLOVE BIO SURGEON STRL SZ7 (GLOVE) ×4 IMPLANT
GUIDEWIRE 0.038 PTFE COATED (WIRE) IMPLANT
GUIDEWIRE ANG ZIPWIRE 038X150 (WIRE) IMPLANT
GUIDEWIRE STR DUAL SENSOR (WIRE) ×4 IMPLANT
KIT BALLIN UROMAX 15FX10 (LABEL) IMPLANT
KIT BALLN UROMAX 15FX4 (MISCELLANEOUS) IMPLANT
KIT BALLN UROMAX 26 75X4 (MISCELLANEOUS)
NS IRRIG 500ML POUR BTL (IV SOLUTION) IMPLANT
PACK CYSTO (CUSTOM PROCEDURE TRAY) ×4 IMPLANT
SET HIGH PRES BAL DIL (LABEL)
SHEATH URET ACCESS 12FR/35CM (UROLOGICAL SUPPLIES) IMPLANT
SHEATH URET ACCESS 12FR/55CM (UROLOGICAL SUPPLIES) IMPLANT
STENT URET 6FRX24 CONTOUR (STENTS) ×4 IMPLANT

## 2014-10-11 NOTE — Anesthesia Procedure Notes (Signed)
Procedure Name: LMA Insertion Date/Time: 10/11/2014 1:58 PM Performed by: Wanita Chamberlain Pre-anesthesia Checklist: Patient identified, Timeout performed, Emergency Drugs available, Patient being monitored and Suction available Patient Re-evaluated:Patient Re-evaluated prior to inductionOxygen Delivery Method: Circle system utilized Preoxygenation: Pre-oxygenation with 100% oxygen Intubation Type: IV induction Ventilation: Mask ventilation without difficulty LMA: LMA inserted LMA Size: 4.0 Number of attempts: 1 Airway Equipment and Method: Bite block Placement Confirmation: breath sounds checked- equal and bilateral and positive ETCO2 Tube secured with: Tape

## 2014-10-11 NOTE — Discharge Instructions (Signed)

## 2014-10-11 NOTE — H&P (Signed)
History of Present Illness    Marilyn Myers returns for follow-up management of right proximal ureteral calculus. She has not had any flank pain. She has mid suprapubic discomfort. She has not retrieved a stone. She stopped taking tamsulosin after 2 days because of itching. She was treated with Cipro 3 weeks for Klebsiella UTI by Jimmey Ralph. She now voids well, does not have dysuria or frequency. She could not give a specimen for urinalysis. Repeat CT scan today shows bilateral non obstructing renal calculi, a 5 mm right mid ureteral calculus with moderate hydronephrosis hydronephrosis.   Past Medical History Problems  1. History of diabetes mellitus (Z86.39) 2. History of hypertension (Z86.79)  Surgical History Problems  1. History of Cystoscopy With Insertion Of Ureteral Stent 2. History of Cystoscopy With Insertion Of Ureteral Stent Bilateral 3. History of Cystoscopy With Insertion Of Ureteral Stent Right 4. History of Cystoscopy With Insertion Of Ureteral Stent Right 5. History of Cystoscopy With Ureteroscopy With Lithotripsy 6. History of Cystoscopy With Ureteroscopy With Lithotripsy 7. History of Cystoscopy With Ureteroscopy With Manipulation Of Calculus 8. History of Lithotripsy 9. History of Lithotripsy  Current Meds 1. Biotin CAPS;  Therapy: (Recorded:22Oct2015) to Recorded 2. Co Q 10 CAPS;  Therapy: (OACZYSAY:30ZSW1093) to Recorded 3. Linzess CAPS;  Therapy: (Recorded:13Nov2015) to Recorded 4. Lisinopril 20 MG Oral Tablet;  Therapy: (Recorded:14Sep2012) to Recorded 5. MetFORMIN HCl - 500 MG Oral Tablet;  Therapy: (Recorded:14Sep2012) to Recorded 6. Oxycodone-Acetaminophen 5-325 MG Oral Tablet;  Therapy: [id number] to Recorded 7. Protonix TBEC;  Therapy: (Recorded:13Nov2015) to Recorded 8. Stool Softener TABS;  Therapy: (Recorded:13Dec2013) to Recorded 9. Utira-C 81.6 MG Oral Tablet; TAKE 1 TABLET Every  6 hours;  Therapy: 22Oct2015 to (Evaluate:05Nov2015)  Requested  for: 22Oct2015; Last  Rx:22Oct2015 Ordered 10. Vitamin B-12 500 MCG Oral Tablet;   Therapy: (Recorded:22Oct2015) to Recorded 11. Vitamin D TABS;   Therapy: (Recorded:26Sep2013) to Recorded  Allergies Medication  1. Codeine Derivatives 2. Tamsulosin HCl CAPS  Family History Problems  1. Family history of Family Health Status Number Of Children   2 sons / 1 daugter 2. Family history of myocardial infarction (Z82.49) : Mother 3. Family history of Father Deceased At Age ____   blood clot 4. Family history of Heart Disease 5. Family history of Mother Deceased At Age 11  Social History Problems  1. Alcohol Use   occassionaly/ wine 2. Caffeine Use   2 3. Former smoker [phone]) 4. Marital History - Currently Married  Review of Systems Genitourinary, constitutional, skin, eye, otolaryngeal, hematologic/lymphatic, cardiovascular, pulmonary, endocrine, musculoskeletal, gastrointestinal, neurological and psychiatric system(s) were reviewed and pertinent findings if present are noted and are otherwise negative.    Vitals Vital Signs [Data Includes: Last 1 Day]  Recorded: [id number] 02:44PM  Blood Pressure: 133 / 81 Temperature: 98.2 F Heart Rate: 103  Physical Exam Constitutional: Well nourished and well developed . No acute distress.  ENT:. The ears and nose are normal in appearance.  Neck: The appearance of the neck is normal and no neck mass is present.  Pulmonary: No respiratory distress and normal respiratory rhythm and effort.  Cardiovascular: Heart rate and rhythm are normal . No peripheral edema.  Abdomen: The abdomen is soft and nontender. No masses are palpated. No CVA tenderness. No hernias are palpable. No hepatosplenomegaly noted.  Genitourinary:  Chaperone Present: .  Examination of the external genitalia shows normal female external genitalia and no lesions. The urethra is normal in appearance and not tender. There is no urethral mass. Vaginal  exam demonstrates  no abnormalities. The adnexa are palpably normal. The bladder is non tender and not distended. The anus is normal on inspection. The perineum is normal on inspection.  Lymphatics: The femoral and inguinal nodes are not enlarged or tender.  Skin: Normal skin turgor, no visible rash and no visible skin lesions.  Neuro/Psych:. Mood and affect are appropriate.    Results/Data I independently reviewed the CT scan and the findings are as noted above. Official reading will follow.     Assessment Assessed  1. Calculus of right ureter (N20.1) 2. Nephrolithiasis (N20.0)   Plan: She failed medical expulsive therapy.  She needs stone manipulation.  The procedure, risks, benefits were discussed with the patient. The risks include but are not limited to hemorrhage, infection, inability to extract the stone, ureteral injury.  She understands and wishes to proceed.

## 2014-10-11 NOTE — Op Note (Signed)
Marilyn Myers is a 63 y.o.   10/11/2014  General  Preoperative diagnosis: Right mid ureteral calculus  Postoperative diagnosis: No definite evidence of ureteral stone  Procedure done: Cystoscopy, right retrograde pyelogram, ureteroscopy, biopsy of ureteral lesion  Surgeon: Charlene Brooke. Treysen Sudbeck  Anesthesia: Gen.  Indication: Patient is a 63 years old female who went to emergency room for right-sided abdominal pain. CT scan showed a 3 mm stone in the proximal ureter. She was treated with medical expulsive therapy. She has not passed the stone. Repeat CT scan showed a 5 mm stone in the mid ureter with moderate hydronephrosis. She is scheduled today for cystoscopy and stone manipulation  Procedure: Patient was identified by wristband and proper timeout was taken.  Under general anesthesia she was prepped and draped and placed in the dorsolithotomy position. A panendoscope was inserted in the bladder. There are some brownish lesions at the base of the bladder suggestive of cystitis cystica. There is no stone or tumor in the bladder. The ureteral orifices are in normal position and shape.  Right retrograde pyelogram:  A cone tip catheter was passed through the cystoscope and the right ureteral orifice. Contrast was injected through the cone-tip catheter. There is no evidence of filling defect in the ureter. The ureter appears normal without hydronephrosis. The calyces and renal pelvis appear normal.  The cone-tip catheter and cystoscope were then removed. A semirigid ureteroscope was then passed in the bladder and in the ureter without any difficulty and advanced up to the proximal ureter. A small foreign material was noted in the ureter and with manipulation of the ureteroscope it migrated up into the kidney. A small polyp was noted in the wall of the ureter. With the biopsy forceps it was biopsied. It appears to be benign polyp. The specimen was sent to pathology.  A sensor wire was then passed  through the ureteroscope in the renal pelvis. The ureteroscope was then removed. A ureteroscope access sheath was passed over the sensor wire and the sensor wire was removed. A flexible digital ureteroscope was passed through the access sheath into the kidney. No stone was identified in the ureter no in the calyces. There is no evidence of hemorrhage. The ureteroscope was then removed. A sensor wire was passed through the ureteroscope access sheath and the access sheath was removed. The sensor wire was then backloaded into the cystoscope. A #[id number] [id number] double-J stent was passed over the sensor wire. The proximal pole of the double-J stent is in the renal pelvis. The distal curl of the stent is in the bladder. The string was left attached to the double-J stent.  The bladder was then emptied and the cystoscope and sensor wire were removed.  Patient tolerated the procedure well and left the OR in satisfactory condition to postanesthesia care unit.

## 2014-10-11 NOTE — Transfer of Care (Signed)
Immediate Anesthesia Transfer of Care Note  Patient: Marilyn Myers  Procedure(s) Performed: Procedure(s): [place], URETEROSCOPY, right ureteral biopsy, AND STENT PLACEMENT (Right)  Patient Location: PACU  Anesthesia Type:General  Level of Consciousness: awake, alert , oriented and patient cooperative  Airway & Oxygen Therapy: Patient Spontanous Breathing and Patient connected to nasal cannula oxygen  Post-op Assessment: Report given to PACU RN and Post -op Vital signs reviewed and stable  Post vital signs: Reviewed and stable  Complications: No apparent anesthesia complications

## 2014-10-11 NOTE — Anesthesia Preprocedure Evaluation (Addendum)
Anesthesia Evaluation  Patient identified by MRN, date of birth, ID band Patient awake    Reviewed: Allergy & Precautions, H&P , NPO status , Patient's Chart, lab work & pertinent test results  Airway Mallampati: II  TM Distance: >3 FB Neck ROM: Full    Dental no notable dental hx. (+) Teeth Intact, Dental Advisory Given   Pulmonary sleep apnea and Continuous Positive Airway Pressure Ventilation , former smoker,  breath sounds clear to auscultation  Pulmonary exam normal       Cardiovascular hypertension, Pt. on medications Rhythm:Regular Rate:Normal     Neuro/Psych Anxiety negative neurological ROS     GI/Hepatic Neg liver ROS, GERD-  Medicated and Controlled,  Endo/Other  diabetes, Type 2, Oral Hypoglycemic AgentsMorbid obesity  Renal/GU Renal disease  negative genitourinary   Musculoskeletal   Abdominal   Peds  Hematology negative hematology ROS (+)   Anesthesia Other Findings   Reproductive/Obstetrics negative OB ROS                            Anesthesia Physical Anesthesia Plan  ASA: III  Anesthesia Plan: General   Post-op Pain Management:    Induction: Intravenous  Airway Management Planned: LMA  Additional Equipment:   Intra-op Plan:   Post-operative Plan: Extubation in OR  Informed Consent: I have reviewed the patients History and Physical, chart, labs and discussed the procedure including the risks, benefits and alternatives for the proposed anesthesia with the patient or authorized representative who has indicated his/her understanding and acceptance.   Dental advisory given  Plan Discussed with: CRNA  Anesthesia Plan Comments:         Anesthesia Quick Evaluation

## 2014-10-14 ENCOUNTER — Encounter (HOSPITAL_BASED_OUTPATIENT_CLINIC_OR_DEPARTMENT_OTHER): Payer: Self-pay | Admitting: Urology

## 2014-10-14 NOTE — Anesthesia Postprocedure Evaluation (Signed)
  Anesthesia Post-op Note  Patient: Marilyn Myers  Procedure(s) Performed: Procedure(s): CYSTOSCOPY WITH RETROGRADE PYELOGRAM, URETEROSCOPY, right ureteral biopsy, AND STENT PLACEMENT (Right)  Patient Location: PACU  Anesthesia Type:General  Level of Consciousness: awake, alert , oriented and patient cooperative  Airway and Oxygen Therapy: Patient Spontanous Breathing and Patient connected to nasal cannula oxygen  Post-op Pain: mild  Post-op Assessment: Post-op Vital signs reviewed and Patient's Cardiovascular Status Stable  Post-op Vital Signs: Reviewed and stable  Last Vitals:  Filed Vitals:   10/11/14 1630  BP: 141/62  Pulse: 70  Temp: 36.4 C  Resp: 16    Complications: No apparent anesthesia complications

## 2014-10-22 ENCOUNTER — Ambulatory Visit
Admission: RE | Admit: 2014-10-22 | Discharge: 2014-10-22 | Disposition: A | Discharge status: Home / Self Care | Payer: Medicare Other | Source: Ambulatory Visit | Attending: *Deleted | Admitting: *Deleted

## 2014-10-22 ENCOUNTER — Other Ambulatory Visit: Payer: Self-pay | Admitting: *Deleted

## 2014-10-22 DIAGNOSIS — Z87891 Personal history of nicotine dependence: Secondary | ICD-10-CM

## 2014-11-26 ENCOUNTER — Other Ambulatory Visit: Payer: Self-pay | Admitting: Urology

## 2014-11-29 ENCOUNTER — Encounter (HOSPITAL_BASED_OUTPATIENT_CLINIC_OR_DEPARTMENT_OTHER): Payer: Self-pay | Admitting: *Deleted

## 2014-11-29 NOTE — Progress Notes (Signed)
NPO AFTER MN. ARRIVE AT 6811. NEEDS ISTAT AND EKG. WILL TAKE PROTONIX AND COZAAR AM DOS W/ SIPS OF WATER.

## 2014-12-03 ENCOUNTER — Ambulatory Visit (HOSPITAL_BASED_OUTPATIENT_CLINIC_OR_DEPARTMENT_OTHER): Admission: RE | Admit: 2014-12-03 | Payer: Medicare Other | Source: Ambulatory Visit | Admitting: Urology

## 2014-12-03 HISTORY — DX: Personal history of other diseases of the digestive system: Z87.19

## 2014-12-03 SURGERY — CYSTOURETEROSCOPY, WITH RETROGRADE PYELOGRAM AND STENT INSERTION
Anesthesia: General | Laterality: Right

## 2015-01-28 ENCOUNTER — Non-Acute Institutional Stay (SKILLED_NURSING_FACILITY): Payer: Medicare Other | Admitting: Adult Health

## 2015-01-28 ENCOUNTER — Encounter: Payer: Self-pay | Admitting: Adult Health

## 2015-01-28 DIAGNOSIS — I10 Essential (primary) hypertension: Secondary | ICD-10-CM | POA: Diagnosis not present

## 2015-01-28 DIAGNOSIS — A4181 Sepsis due to Enterococcus: Secondary | ICD-10-CM | POA: Diagnosis not present

## 2015-01-28 DIAGNOSIS — G629 Polyneuropathy, unspecified: Secondary | ICD-10-CM

## 2015-01-28 DIAGNOSIS — A047 Enterocolitis due to Clostridium difficile: Secondary | ICD-10-CM | POA: Diagnosis not present

## 2015-01-28 DIAGNOSIS — IMO0002 Reserved for concepts with insufficient information to code with codable children: Secondary | ICD-10-CM

## 2015-01-28 DIAGNOSIS — E1140 Type 2 diabetes mellitus with diabetic neuropathy, unspecified: Secondary | ICD-10-CM

## 2015-01-28 DIAGNOSIS — R5381 Other malaise: Secondary | ICD-10-CM

## 2015-01-28 DIAGNOSIS — E1165 Type 2 diabetes mellitus with hyperglycemia: Secondary | ICD-10-CM

## 2015-01-28 DIAGNOSIS — I152 Hypertension secondary to endocrine disorders: Secondary | ICD-10-CM | POA: Insufficient documentation

## 2015-01-28 DIAGNOSIS — A0472 Enterocolitis due to Clostridium difficile, not specified as recurrent: Secondary | ICD-10-CM

## 2015-01-28 NOTE — Progress Notes (Signed)
Patient ID: Marilyn Myers, female   DOB: 1951/02/20, 64 y.o.   MRN: [id number]   01/28/2015  Facility:  Nursing Home Location:  [place] Room Number: 144-8 LEVEL OF CARE:  SNF (31)   Chief Complaint  Patient presents with  . Hospitalization Follow-up    Physical deconditioning, perforated bowel S/P Laparotomy exploratory with bowel resection, enterococcal septicemia, C. difficile colitis, hypertension, neuropathy and diabetes mellitus    HISTORY OF PRESENT ILLNESS:  This is a 64 year old female who was been admitted to [facility] on 01/27/15 from [facility]. She has past medical history of diverticulitis, GERD, kidney stone, hypertension and diabetes mellitus. She underwent a laparoscopic sigmoid resection of complicated diverticulitis. She developed abdominal pain. CT of abdomen and pelvis showed these since of the rectal colonic anastomosis with gas and stranding within the left abdominal mesentery. She was immediately taken to the operating room and found to have necrotic proximal sigmoid colon. She underwent colon resection and creation of colostomy. She was left intubated and was transferred to [facility] for further management. She was extubated 24 hours after admission to [facility].  Her blood culture had been positive for gram-positive cocci 2 days after admission to [facility]. She then received IV antibiotics.  Her diet was started and midline wound dressing changes done. She improved and discharged to [facility] with vancomycin by mouth for C. difficile colitis and so was seen IV for enterococcal septicemia.  She has been admitted for a short-term rehabilitation.  PAST MEDICAL HISTORY:  Past Medical History  Diagnosis Date  . Anxiety   . Hypertension   . Type 2 diabetes mellitus   . Diverticulosis, sigmoid   . Right ureteral stone   . Nephrolithiasis     BILATERAL  . History of kidney stones   . GERD (gastroesophageal reflux  disease)   . OSA on CPAP   . History of diverticulitis of colon     09/ 2015  RESOLVED   . Diverticulitis of intestine without perforation or abscess     CURRENT MEDICATIONS: Reviewed per MAR/see medication list  Allergies  Allergen Reactions  . Codeine Nausea And Vomiting  . Flomax [Tamsulosin Hcl] Itching     REVIEW OF SYSTEMS:  GENERAL: no change in appetite, no fatigue, no weight changes, no fever, chills or weakness RESPIRATORY: no cough, SOB, DOE, wheezing, hemoptysis CARDIAC: no chest pain, edema or palpitations GI: no abdominal pain, diarrhea, constipation, nausea or vomiting, + heartburn  PHYSICAL EXAMINATION  GENERAL: no acute distress, obese EYES: conjunctivae normal, sclerae normal, normal eye lids NECK: supple, trachea midline, no neck masses, no thyroid tenderness, no thyromegaly LYMPHATICS: no LAN in the neck, no supraclavicular LAN RESPIRATORY: breathing is even & unlabored, BS CTAB CARDIAC: RRR, no murmur,no extra heart sounds, no edema GI: abdomen soft, normal BS, no masses, no tenderness, no hepatomegaly, no splenomegaly, colostomy LUQ, bilateral JP drain (RLQ and LLQ), open midline abdominal wound with dressing EXTREMITIES: Able to move 4 extremities. Right upper arm SL PICC PSYCHIATRIC: the patient is alert & oriented to person, affect & behavior appropriate  LABS/RADIOLOGY: 01/27/15  WBC 14.6 hemoglobin 9.3 hematocrit 28.3 MCV 94 01/25/15  hemoglobin A1c 5.8 01/28/15  sodium 139 potassium 4.1 glucose 132 BUN 18 creatinine 0.8 the calcium 8.7 GFR >60 Labs reviewed: Basic Metabolic Panel:  Recent Labs  07/26/14 1109 10/11/14 1314  NA 138 141  K 4.0 4.0  CL 98  --   CO2 21  --  GLUCOSE 189* 129*  BUN 20  --   CREATININE 0.89  --   CALCIUM 9.8  --    Liver Function Tests:  Recent Labs  07/26/14 1109  AST 22  ALT 27  ALKPHOS 87  BILITOT 1.0  PROT 8.6*  ALBUMIN 4.3    Recent Labs  07/26/14 1109  LIPASE 24   CBC:  Recent Labs   07/26/14 1109 10/11/14 1314  WBC 14.7*  --   NEUTROABS 12.0*  --   HGB 14.5 12.9  HCT 44.1 38.0  MCV 92.6  --   PLT 275  --    CBG:  Recent Labs  10/11/14 1526  GLUCAP 113*    ASSESSMENT/PLAN:  Physical deconditioning - for rehabilitation Enterococcal septicemia - continue Zosyn IV C. difficile colitis - continue vancomycin 125 mg by mouth every 6 hours Perforated bowel S/P Exploratory Laparotomy with bowel resection -  continue Norco 5 standard 25 mg 1-2 tabs by mouth every 8 hours for pain; Lovenox 40 mg subcutaneous daily for DVT prophylaxis Hypertension - continue Cardizem CD 120 mg by mouth every 12 hours and Cozaar 100 mg by mouth every morning Neuropathy - continue Neurontin 300 mg by mouth 3 times a day Diabetes mellitus, type 2 - continue Glucophage 1000 mg by mouth twice a day   Goals of care:  Short-term rehabilitation   Labs/test ordered:  For CBC, BMP Q Mondays   Spent 50 minutes in patient care.    [facility], NP [employer] [phone]

## 2015-01-29 ENCOUNTER — Non-Acute Institutional Stay (SKILLED_NURSING_FACILITY): Payer: Medicare Other | Admitting: Internal Medicine

## 2015-01-29 DIAGNOSIS — E1142 Type 2 diabetes mellitus with diabetic polyneuropathy: Secondary | ICD-10-CM | POA: Diagnosis not present

## 2015-01-29 DIAGNOSIS — R5381 Other malaise: Secondary | ICD-10-CM | POA: Diagnosis not present

## 2015-01-29 DIAGNOSIS — A047 Enterocolitis due to Clostridium difficile: Secondary | ICD-10-CM

## 2015-01-29 DIAGNOSIS — R110 Nausea: Secondary | ICD-10-CM

## 2015-01-29 DIAGNOSIS — A4181 Sepsis due to Enterococcus: Secondary | ICD-10-CM | POA: Diagnosis not present

## 2015-01-29 DIAGNOSIS — G629 Polyneuropathy, unspecified: Secondary | ICD-10-CM

## 2015-01-29 DIAGNOSIS — R05 Cough: Secondary | ICD-10-CM | POA: Diagnosis not present

## 2015-01-29 DIAGNOSIS — R12 Heartburn: Secondary | ICD-10-CM

## 2015-01-29 DIAGNOSIS — R058 Other specified cough: Secondary | ICD-10-CM

## 2015-01-29 DIAGNOSIS — A0472 Enterocolitis due to Clostridium difficile, not specified as recurrent: Secondary | ICD-10-CM

## 2015-01-29 DIAGNOSIS — I10 Essential (primary) hypertension: Secondary | ICD-10-CM

## 2015-01-29 NOTE — Progress Notes (Signed)
Patient ID: Marilyn Myers, female   DOB: 1951/06/24, 64 y.o.   MRN: [id number]     [facility]   PCP: ANDY,CAMILLE L, MD  Code Status: full code  Allergies  Allergen Reactions  . Codeine Nausea And Vomiting  . Flomax [Tamsulosin Hcl] Itching    Chief Complaint  Patient presents with  . New Admit To SNF     HPI:  64 year old patient is here for short term rehabilitation post hospital admission from [facility] with perforated bowel and necrotic proximal sigmoid colon from complicated diverticulitis. She underwent laparoscopic colon resection with creation of colostomy. Her blood culture grew positive for gram-positive cocci. She had enterococcal septicemia and c.diff colitis. She was started on antibiotics for these. She is seen in her room today with her husband present. She complaints of being nauseous and has heartburn. She also has cough and is not able to bring out the phlegm. She has past medical history of diverticulitis, GERD, hypertension and diabetes mellitus among others.  Review of Systems:  Constitutional: Negative for fever, chills, diaphoresis. Positive for fatigue HENT: Negative for headache, congestion, nasal discharge Eyes: Negative for eye pain, blurred vision, double vision and discharge.  Respiratory: Negative for shortness of breath and wheezing.   Cardiovascular: Negative for chest pain, palpitations, leg swelling.  Gastrointestinal: Negative for vomiting, abdominal pain. Has abdominal discomfort Genitourinary: Negative for dysuria Musculoskeletal: Negative for back pain, falls Skin: Negative for itching, rash.  Neurological: positive for weakness. Negative for dizziness Psychiatric/Behavioral: Negative for depression   Past Medical History  Diagnosis Date  . Anxiety   . Hypertension   . Type 2 diabetes mellitus   . Diverticulosis, sigmoid   . Right ureteral stone   . Nephrolithiasis     BILATERAL  .  History of kidney stones   . GERD (gastroesophageal reflux disease)   . OSA on CPAP   . History of diverticulitis of colon     09/ 2015  RESOLVED   . Diverticulitis of intestine without perforation or abscess    Past Surgical History  Procedure Laterality Date  . Cervical cone biopsy  1976  . Extracorporeal shock wave lithotripsy  x3  last one 01-24-2012  . Tubal ligation  1987  . Cystoscopy/retrograde/ureteroscopy  10/08/2011    Procedure: CYSTOSCOPY/RETROGRADE/URETEROSCOPY;  Surgeon: Hanley Ben, MD;  Location: [facility];  Service: Urology;  Laterality: N/A;  CYSTOSCOPY, RIGHT  RETROGRADE, RIGHT URETEROSCOPY HOLMIUM LASER WITH BASKET STONE EXTRACTION AND RIGHT URETERAL STENT PLACEMENT  . Ureteroscpic stone extraction/ stent placement  left 08-20-2011  . Cysto/  bilateral retrograde pyelogram/  placement bilateral ureteral stents  07-10-2011  . Cystoscopy with retrograde pyelogram, ureteroscopy and stent placement Right 10/11/2014    Procedure: CYSTOSCOPY WITH RETROGRADE PYELOGRAM, URETEROSCOPY, right ureteral biopsy, AND STENT PLACEMENT;  Surgeon: Arvil Persons, MD;  Location: [facility];  Service: Urology;  Laterality: Right;   Social History:   reports that she quit smoking about 18 years ago. Her smoking use included Cigarettes. She has a 25 pack-year smoking history. She has never used smokeless tobacco. She reports that she drinks alcohol. She reports that she does not use illicit drugs.  No family history on file.  Medications: Patient's Medications  New Prescriptions   No medications on file  Previous Medications   BIOTIN 5000 MCG CAPS    Take by mouth.   CHOLECALCIFEROL (VITAMIN D3) 1000 UNITS CAPS    Take 1  capsule by mouth daily.   COENZYME Q10 (COQ-10) 100 MG CAPS    Take 1 capsule by mouth daily.   DOCUSATE SODIUM (COLACE) 100 MG CAPSULE    Take 100 mg by mouth daily as needed for mild constipation.   LOSARTAN (COZAAR) 100 MG TABLET     Take 100 mg by mouth every morning.    METFORMIN (GLUCOPHAGE) 1000 MG TABLET    Take 1,000 mg by mouth 2 (two) times daily with a meal.   ONDANSETRON (ZOFRAN ODT) 8 MG DISINTEGRATING TABLET    Take 1 tablet (8 mg total) by mouth every 8 (eight) hours as needed for nausea or vomiting.   PANTOPRAZOLE (PROTONIX) 40 MG TABLET    Take 40 mg by mouth every morning.   POLYETHYLENE GLYCOL 3350 (MIRALAX PO)    Take by mouth as needed.   VITAMIN B-12 (CYANOCOBALAMIN) 1000 MCG TABLET    Take 1,000 mcg by mouth daily.  Modified Medications   No medications on file  Discontinued Medications   No medications on file     Physical Exam: Filed Vitals:   01/29/15 1304  BP: 148/70  Pulse: 78  Temp: 97 F (36.1 C)  Resp: 18  SpO2: 96%    General- elderly female, obese, in no acute distress Head- normocephalic, atraumatic Throat- moist mucus membrane, normal oropharynx  Eyes- no pallor, no icterus, no discharge, normal conjunctiva, normal sclera Neck- no cervical lymphadenopathy Cardiovascular- normal s1,s2, no murmurs, palpable dorsalis pedis, no leg edema Respiratory- Poor inspiratory effort but CTAB. no wheeze, no rhonchi, no crackles, no use of accessory muscles Abdomen- bowel sounds present, soft, non tender, colostomy bag with stool, surgical incision with dressing in place Musculoskeletal- able to move all 4 extremities, generalized weakness Neurological- no focal deficit Skin- warm and dry Psychiatry- alert and oriented    Labs reviewed: Basic Metabolic Panel:  Recent Labs  07/26/14 1109 10/11/14 1314  NA 138 141  K 4.0 4.0  CL 98  --   CO2 21  --   GLUCOSE 189* 129*  BUN 20  --   CREATININE 0.89  --   CALCIUM 9.8  --    Liver Function Tests:  Recent Labs  07/26/14 1109  AST 22  ALT 27  ALKPHOS 87  BILITOT 1.0  PROT 8.6*  ALBUMIN 4.3    Recent Labs  07/26/14 1109  LIPASE 24   No results for input(s): AMMONIA in the last 8760 hours. CBC:  Recent Labs   07/26/14 1109 10/11/14 1314  WBC 14.7*  --   NEUTROABS 12.0*  --   HGB 14.5 12.9  HCT 44.1 38.0  MCV 92.6  --   PLT 275  --    01/27/15  WBC 14.6 hemoglobin 9.3 hematocrit 28.3 MCV 94 01/25/15  hemoglobin A1c 5.8 01/28/15  sodium 139 potassium 4.1 glucose 132 BUN 18 creatinine 0.8 the calcium 8.7 GFR >60    Assessment/Plan  Physical deconditioning  Will have her work with physical therapy and occupational therapy team to help with gait training and muscle strengthening exercises.fall precautions. Skin care. Encourage to be out of bed.   Enterococcal septicemia Remains afebrile. Continue iv zosyn and complete the course. Monitor temperature and wbc curve  C. difficile colitis continue vancomycin 125 mg by mouth every 6 hours for total of 2 weeks. Encourage hydration. Contact precautions. Will d/c her colace and linzess for now with her loose stools  Heartburn Change her pepcid to 40 mg bid for now  Nausea Without vomiting. Could be a side effect of her medications. Start zofran 4 mg im or po as tolerated q6h prn and reassess. Check cmp  Perforated bowel  S/P Exploratory Laparotomy with bowel resection. Continue colostomy care. Continue Norco 5-325 mg  1-2 tabs every 8 hours for pain. On Lovenox for DVT prophylaxis  Hypertension SBP on higher side today, her cough, pain and nausea could all be contributing to this. monitro bp reading and adjust bp med if needed. continue Cardizem CD 120 mg bid and Cozaar 100 mg daily  Diabetes mellitus with neuropathy Reviewed a1c. continue Glucophage 1000 mg bid and  neurontin 300 mg tid for nerve pain. Continue cozaar  cough Has dry cough. No other uri symptoms. Has recent intra-abdominal surgery. Will add robitussin dm 5 cc bid x 5 days to help with production of phlegm and help prevent increase in intra-abdominal pressure and opening of her surgical incision  Labs/tests ordered: cbc with diff, cmp 01/30/15  Family/ staff Communication:  reviewed care plan with patient, her husband and nursing supervisor    Blanchie Serve, MD  [place] [phone] (Monday-Friday 8 am - 5 pm) [phone] (afterhours)

## 2015-05-19 DIAGNOSIS — Z8739 Personal history of other diseases of the musculoskeletal system and connective tissue: Secondary | ICD-10-CM | POA: Insufficient documentation

## 2015-05-19 DIAGNOSIS — M8580 Other specified disorders of bone density and structure, unspecified site: Secondary | ICD-10-CM | POA: Insufficient documentation

## 2015-07-02 DIAGNOSIS — Z9889 Other specified postprocedural states: Secondary | ICD-10-CM | POA: Insufficient documentation

## 2015-08-28 ENCOUNTER — Other Ambulatory Visit: Payer: Self-pay | Admitting: Adult Health

## 2015-11-23 HISTORY — PX: ABDOMINAL WOUND DEHISCENCE: SHX540

## 2015-12-15 DIAGNOSIS — I10 Essential (primary) hypertension: Secondary | ICD-10-CM | POA: Diagnosis not present

## 2015-12-15 DIAGNOSIS — E782 Mixed hyperlipidemia: Secondary | ICD-10-CM | POA: Diagnosis not present

## 2015-12-15 DIAGNOSIS — G4733 Obstructive sleep apnea (adult) (pediatric): Secondary | ICD-10-CM | POA: Diagnosis not present

## 2015-12-15 DIAGNOSIS — E1142 Type 2 diabetes mellitus with diabetic polyneuropathy: Secondary | ICD-10-CM | POA: Diagnosis not present

## 2015-12-15 DIAGNOSIS — E1169 Type 2 diabetes mellitus with other specified complication: Secondary | ICD-10-CM | POA: Diagnosis not present

## 2016-01-09 DIAGNOSIS — E669 Obesity, unspecified: Secondary | ICD-10-CM | POA: Diagnosis not present

## 2016-01-09 DIAGNOSIS — I10 Essential (primary) hypertension: Secondary | ICD-10-CM | POA: Diagnosis not present

## 2016-01-09 DIAGNOSIS — Z9889 Other specified postprocedural states: Secondary | ICD-10-CM | POA: Diagnosis not present

## 2016-01-09 DIAGNOSIS — G4733 Obstructive sleep apnea (adult) (pediatric): Secondary | ICD-10-CM | POA: Diagnosis not present

## 2016-01-09 DIAGNOSIS — N200 Calculus of kidney: Secondary | ICD-10-CM | POA: Diagnosis not present

## 2016-01-09 DIAGNOSIS — E1169 Type 2 diabetes mellitus with other specified complication: Secondary | ICD-10-CM | POA: Diagnosis not present

## 2016-01-09 DIAGNOSIS — E782 Mixed hyperlipidemia: Secondary | ICD-10-CM | POA: Diagnosis not present

## 2016-01-09 DIAGNOSIS — E1142 Type 2 diabetes mellitus with diabetic polyneuropathy: Secondary | ICD-10-CM | POA: Diagnosis not present

## 2016-01-13 DIAGNOSIS — E1142 Type 2 diabetes mellitus with diabetic polyneuropathy: Secondary | ICD-10-CM | POA: Diagnosis not present

## 2016-01-13 DIAGNOSIS — I10 Essential (primary) hypertension: Secondary | ICD-10-CM | POA: Diagnosis not present

## 2016-01-13 DIAGNOSIS — K5720 Diverticulitis of large intestine with perforation and abscess without bleeding: Secondary | ICD-10-CM | POA: Diagnosis not present

## 2016-01-13 DIAGNOSIS — G4733 Obstructive sleep apnea (adult) (pediatric): Secondary | ICD-10-CM | POA: Diagnosis not present

## 2016-01-23 DIAGNOSIS — F54 Psychological and behavioral factors associated with disorders or diseases classified elsewhere: Secondary | ICD-10-CM | POA: Diagnosis not present

## 2016-02-04 ENCOUNTER — Other Ambulatory Visit: Payer: Self-pay | Admitting: Adult Health

## 2016-02-05 DIAGNOSIS — E1142 Type 2 diabetes mellitus with diabetic polyneuropathy: Secondary | ICD-10-CM | POA: Diagnosis not present

## 2016-03-08 ENCOUNTER — Other Ambulatory Visit: Payer: Self-pay | Admitting: Adult Health

## 2016-03-09 DIAGNOSIS — E669 Obesity, unspecified: Secondary | ICD-10-CM | POA: Diagnosis not present

## 2016-03-09 DIAGNOSIS — I10 Essential (primary) hypertension: Secondary | ICD-10-CM | POA: Diagnosis not present

## 2016-03-09 DIAGNOSIS — Z9989 Dependence on other enabling machines and devices: Secondary | ICD-10-CM | POA: Diagnosis not present

## 2016-03-09 DIAGNOSIS — G4733 Obstructive sleep apnea (adult) (pediatric): Secondary | ICD-10-CM | POA: Diagnosis not present

## 2016-03-25 DIAGNOSIS — I10 Essential (primary) hypertension: Secondary | ICD-10-CM | POA: Diagnosis not present

## 2016-03-25 DIAGNOSIS — E669 Obesity, unspecified: Secondary | ICD-10-CM | POA: Diagnosis not present

## 2016-03-25 DIAGNOSIS — E1142 Type 2 diabetes mellitus with diabetic polyneuropathy: Secondary | ICD-10-CM | POA: Diagnosis not present

## 2016-04-05 ENCOUNTER — Other Ambulatory Visit: Payer: Self-pay | Admitting: Internal Medicine

## 2016-04-05 DIAGNOSIS — Z0000 Encounter for general adult medical examination without abnormal findings: Secondary | ICD-10-CM | POA: Diagnosis not present

## 2016-04-05 DIAGNOSIS — R350 Frequency of micturition: Secondary | ICD-10-CM | POA: Diagnosis not present

## 2016-04-05 DIAGNOSIS — N390 Urinary tract infection, site not specified: Secondary | ICD-10-CM | POA: Diagnosis not present

## 2016-04-05 DIAGNOSIS — N200 Calculus of kidney: Secondary | ICD-10-CM | POA: Diagnosis not present

## 2016-04-05 DIAGNOSIS — N132 Hydronephrosis with renal and ureteral calculous obstruction: Secondary | ICD-10-CM | POA: Diagnosis not present

## 2016-04-05 DIAGNOSIS — N202 Calculus of kidney with calculus of ureter: Secondary | ICD-10-CM | POA: Diagnosis not present

## 2016-04-15 DIAGNOSIS — N201 Calculus of ureter: Secondary | ICD-10-CM | POA: Diagnosis not present

## 2016-04-15 DIAGNOSIS — Z0000 Encounter for general adult medical examination without abnormal findings: Secondary | ICD-10-CM | POA: Diagnosis not present

## 2016-04-15 DIAGNOSIS — R3129 Other microscopic hematuria: Secondary | ICD-10-CM | POA: Diagnosis not present

## 2016-04-16 ENCOUNTER — Other Ambulatory Visit: Payer: Self-pay | Admitting: Urology

## 2016-04-22 ENCOUNTER — Encounter (HOSPITAL_COMMUNITY): Payer: Self-pay | Admitting: *Deleted

## 2016-04-26 ENCOUNTER — Encounter (HOSPITAL_COMMUNITY): Payer: Self-pay | Admitting: *Deleted

## 2016-04-26 ENCOUNTER — Ambulatory Visit (HOSPITAL_COMMUNITY): Payer: Medicare Other

## 2016-04-26 ENCOUNTER — Encounter (HOSPITAL_COMMUNITY)
Admission: RE | Disposition: A | Discharge status: Home / Self Care | Payer: Self-pay | Source: Ambulatory Visit | Attending: Urology

## 2016-04-26 ENCOUNTER — Ambulatory Visit (HOSPITAL_COMMUNITY)
Admission: RE | Admit: 2016-04-26 | Discharge: 2016-04-26 | Disposition: A | Discharge status: Home / Self Care | Payer: Medicare Other | Source: Ambulatory Visit | Attending: Urology | Admitting: Urology

## 2016-04-26 DIAGNOSIS — Z791 Long term (current) use of non-steroidal anti-inflammatories (NSAID): Secondary | ICD-10-CM | POA: Diagnosis not present

## 2016-04-26 DIAGNOSIS — Z888 Allergy status to other drugs, medicaments and biological substances status: Secondary | ICD-10-CM | POA: Diagnosis not present

## 2016-04-26 DIAGNOSIS — N201 Calculus of ureter: Secondary | ICD-10-CM | POA: Diagnosis not present

## 2016-04-26 DIAGNOSIS — Z79899 Other long term (current) drug therapy: Secondary | ICD-10-CM | POA: Insufficient documentation

## 2016-04-26 DIAGNOSIS — G4730 Sleep apnea, unspecified: Secondary | ICD-10-CM | POA: Diagnosis not present

## 2016-04-26 DIAGNOSIS — I10 Essential (primary) hypertension: Secondary | ICD-10-CM | POA: Insufficient documentation

## 2016-04-26 DIAGNOSIS — E119 Type 2 diabetes mellitus without complications: Secondary | ICD-10-CM | POA: Diagnosis not present

## 2016-04-26 DIAGNOSIS — Z885 Allergy status to narcotic agent status: Secondary | ICD-10-CM | POA: Diagnosis not present

## 2016-04-26 DIAGNOSIS — Z7901 Long term (current) use of anticoagulants: Secondary | ICD-10-CM | POA: Diagnosis not present

## 2016-04-26 DIAGNOSIS — N202 Calculus of kidney with calculus of ureter: Secondary | ICD-10-CM | POA: Diagnosis not present

## 2016-04-26 LAB — GLUCOSE, CAPILLARY: Glucose-Capillary: 147 mg/dL — ABNORMAL HIGH (ref 65–99)

## 2016-04-26 SURGERY — LITHOTRIPSY, ESWL
Anesthesia: LOCAL | Laterality: Right

## 2016-04-26 MED ORDER — DIAZEPAM 5 MG PO TABS
10.0000 mg | ORAL_TABLET | ORAL | Status: AC
  Administered 2016-04-26: 10 mg via ORAL
  Filled 2016-04-26: qty 2

## 2016-04-26 MED ORDER — SODIUM CHLORIDE 0.9 % IV SOLN
INTRAVENOUS | Status: DC
  Administered 2016-04-26: 11:00:00 via INTRAVENOUS

## 2016-04-26 MED ORDER — DIPHENHYDRAMINE HCL 25 MG PO CAPS
25.0000 mg | ORAL_CAPSULE | ORAL | Status: AC
  Administered 2016-04-26: 25 mg via ORAL
  Filled 2016-04-26: qty 1

## 2016-04-26 MED ORDER — CIPROFLOXACIN HCL 500 MG PO TABS
500.0000 mg | ORAL_TABLET | ORAL | Status: AC
  Administered 2016-04-26: 500 mg via ORAL
  Filled 2016-04-26: qty 1

## 2016-04-26 NOTE — Discharge Instructions (Signed)
You had valium. Do not drive for 24 hours.

## 2016-04-26 NOTE — H&P (Signed)
Chief Complaint flank pain and dysuria   History of Present Illness [name] is a [phone] who was seen for right flank pain and dysuria. The pain worsened 3 days ago. Pain is dull, constant, moderate, nonradiating right flank pain. It is worse with standing. The pain improved after narcotics. She has associated nausea. No other exacerbating/alleviating events.   She has had dysuria, urgency, frequency which has been worsening the last 3 days.   CT scan shows a 32m right mid ureteral calculus   Past Medical History Problems  1. History of C. difficile colitis (A04.7) 2. History of diabetes mellitus (Z86.39) 3. History of hypertension (Z86.79)  Surgical History Problems  1. History of Colon Surgery 2. History of Cystoscopy With Insertion Of Ureteral Stent 3. History of Cystoscopy With Insertion Of Ureteral Stent Bilateral 4. History of Cystoscopy With Insertion Of Ureteral Stent Right 5. History of Cystoscopy With Insertion Of Ureteral Stent Right 6. History of Cystoscopy With Insertion Of Ureteral Stent Right 7. History of Cystoscopy With Ureteroscopy For Biopsy Right 8. History of Cystoscopy With Ureteroscopy With Lithotripsy 9. History of Cystoscopy With Ureteroscopy With Lithotripsy 10. History of Cystoscopy With Ureteroscopy With Manipulation Of Calculus 11. History of Renal Lithotripsy 12. History of Renal Lithotripsy  Current Meds 1. Biotin 5000 MCG Oral Capsule;  Therapy: (Recorded:15May2017) to Recorded 2. Cartia XT 120 MG Oral Capsule Extended Release 24 Hour;  Therapy: [id number] Recorded 3. Fiber TABS;  Therapy: (Recorded:15May2017) to Recorded 4. Gabapentin 300 MG Oral Capsule;  Therapy: 28Sep2015 to Recorded 5. Losartan Potassium-HCTZ 100-25 MG Oral Tablet;  Therapy: [id number] Recorded 6. MetFORMIN HCl - 1000 MG Oral Tablet;  Therapy: (Recorded:15May2017) to Recorded 7. Multiple Vitamin TABS;  Therapy: (Recorded:15May2017) to Recorded 8. Ondansetron 4 MG  Oral Tablet Dispersible;  Therapy: [id number] Recorded 9. Pantoprazole Sodium 40 MG Oral Tablet Delayed Release;  Therapy: [id number] Recorded 10. Rosuvastatin Calcium 10 MG Oral Tablet;   Therapy: [id number] Recorded  Allergies Medication  1. ACE Inhibitors 2. Codeine Derivatives 3. Tamsulosin HCl CAPS  Family History Problems  1. Family history of Family Health Status Number Of Children   2 sons / 1 daugter 2. Family history of myocardial infarction (Z82.49) : Mother 3. Family history of Father Deceased At Age ___   blood clot 4. Family history of Heart Disease 5. Family history of Mother Deceased At Age 67 Social History Problems  1. Alcohol Use (History)   occassionaly/ wine 2. Caffeine Use   2 3. Former smoker [phone] 4. Marital History - Currently Married  Review of Systems Genitourinary, constitutional, skin, eye, otolaryngeal, hematologic/lymphatic, cardiovascular, pulmonary, endocrine, gastrointestinal, neurological and psychiatric system(s) were reviewed and pertinent findings if present are noted and are otherwise negative.  Genitourinary: urinary frequency, nocturia and incontinence.  Musculoskeletal: back pain, but no joint pain.    Vitals Vital Signs  Height: 5 ft 7.5 in Weight: 238 lb  BMI Calculated: 36.73 BSA Calculated: 2.19 Blood Pressure: 140 / 81 Temperature: 97.7 F Heart Rate: 66  Physical Exam Constitutional: Well nourished and well developed.  ENT:. The ears and nose are normal in appearance. The oropharynx is normal.  Neck: The appearance of the neck is normal and no neck mass is present.  Pulmonary: No respiratory distress and normal respiratory rhythm and effort.  Cardiovascular:. The arterial pulses are normal. No peripheral edema.  Abdomen: The abdomen is mildly obese. The abdomen is soft and nontender. No masses are palpated. No CVA tenderness. No hernias  are palpable. No hepatosplenomegaly noted.  Lymphatics: The  posterior cervical and anterior cervical nodes are not enlarged or tender.  Skin: Normal skin turgor, no visible rash and normal skin color and pigmentation.  Neuro/Psych:. Mood and affect are appropriate. No focal sensory deficits.    Results/Data  UA With REFLEX    COLOR YELLOW     APPEARANCE CLOUDY     SPECIFIC GRAVITY 1.020     pH 5.5     GLUCOSE NEGATIVE     BILIRUBIN NEGATIVE     KETONE NEGATIVE     BLOOD 2+     PROTEIN TRACE     NITRITE NEGATIVE     LEUKOCYTE ESTERASE 3+     WBC >60     RBC 3-10     SQUAMOUS EPITHELIAL/HPF 6-10     BACTERIA FEW     CRYSTALS NONE SEEN     CASTS NONE SEEN     Yeast NONE SEEN     Assessment Marilyn Myers went over the procedure of extracorporal shockwave lithotripsy with the patient in quite detail. She understands the risk of poor fragmentation resulting in further ureteral traction. She also understands that possibility of needing additional procedures to clear stones. Finally, we discussed the risks of hematoma. The patient is willing to proceed.  Urine culture done on 04/15/16 - negative.  Plan   1. MET  2. rx for percocet, zofran motin given  3. ESWL on 04/26/14

## 2016-04-26 NOTE — Progress Notes (Signed)
Lithotripsy procedure cancelled as Dr Karsten Ro unable to visualize stone.

## 2016-05-06 ENCOUNTER — Other Ambulatory Visit: Payer: Self-pay | Admitting: Internal Medicine

## 2016-05-07 DIAGNOSIS — H25013 Cortical age-related cataract, bilateral: Secondary | ICD-10-CM | POA: Diagnosis not present

## 2016-05-07 DIAGNOSIS — E119 Type 2 diabetes mellitus without complications: Secondary | ICD-10-CM | POA: Diagnosis not present

## 2016-05-08 ENCOUNTER — Other Ambulatory Visit: Payer: Self-pay | Admitting: Internal Medicine

## 2016-05-13 DIAGNOSIS — E1149 Type 2 diabetes mellitus with other diabetic neurological complication: Secondary | ICD-10-CM | POA: Diagnosis not present

## 2016-06-17 DIAGNOSIS — E1149 Type 2 diabetes mellitus with other diabetic neurological complication: Secondary | ICD-10-CM | POA: Diagnosis not present

## 2016-06-25 DIAGNOSIS — I10 Essential (primary) hypertension: Secondary | ICD-10-CM | POA: Diagnosis not present

## 2016-06-25 DIAGNOSIS — Z0000 Encounter for general adult medical examination without abnormal findings: Secondary | ICD-10-CM | POA: Diagnosis not present

## 2016-06-25 DIAGNOSIS — E1142 Type 2 diabetes mellitus with diabetic polyneuropathy: Secondary | ICD-10-CM | POA: Diagnosis not present

## 2016-06-25 DIAGNOSIS — Z6835 Body mass index (BMI) 35.0-35.9, adult: Secondary | ICD-10-CM | POA: Diagnosis not present

## 2016-07-15 DIAGNOSIS — E669 Obesity, unspecified: Secondary | ICD-10-CM | POA: Diagnosis not present

## 2016-07-15 DIAGNOSIS — Z136 Encounter for screening for cardiovascular disorders: Secondary | ICD-10-CM | POA: Diagnosis not present

## 2016-07-15 DIAGNOSIS — Z9989 Dependence on other enabling machines and devices: Secondary | ICD-10-CM | POA: Diagnosis not present

## 2016-07-15 DIAGNOSIS — K5720 Diverticulitis of large intestine with perforation and abscess without bleeding: Secondary | ICD-10-CM | POA: Diagnosis not present

## 2016-07-15 DIAGNOSIS — N200 Calculus of kidney: Secondary | ICD-10-CM | POA: Diagnosis not present

## 2016-07-15 DIAGNOSIS — Z87891 Personal history of nicotine dependence: Secondary | ICD-10-CM | POA: Diagnosis not present

## 2016-07-15 DIAGNOSIS — E1142 Type 2 diabetes mellitus with diabetic polyneuropathy: Secondary | ICD-10-CM | POA: Diagnosis not present

## 2016-07-15 DIAGNOSIS — M8580 Other specified disorders of bone density and structure, unspecified site: Secondary | ICD-10-CM | POA: Diagnosis not present

## 2016-07-15 DIAGNOSIS — G4733 Obstructive sleep apnea (adult) (pediatric): Secondary | ICD-10-CM | POA: Diagnosis not present

## 2016-07-15 DIAGNOSIS — I10 Essential (primary) hypertension: Secondary | ICD-10-CM | POA: Diagnosis not present

## 2016-07-16 DIAGNOSIS — E1142 Type 2 diabetes mellitus with diabetic polyneuropathy: Secondary | ICD-10-CM | POA: Diagnosis not present

## 2016-07-16 DIAGNOSIS — K5720 Diverticulitis of large intestine with perforation and abscess without bleeding: Secondary | ICD-10-CM | POA: Diagnosis not present

## 2016-07-16 DIAGNOSIS — M8580 Other specified disorders of bone density and structure, unspecified site: Secondary | ICD-10-CM | POA: Diagnosis not present

## 2016-07-16 DIAGNOSIS — I10 Essential (primary) hypertension: Secondary | ICD-10-CM | POA: Diagnosis not present

## 2016-07-16 DIAGNOSIS — E669 Obesity, unspecified: Secondary | ICD-10-CM | POA: Diagnosis not present

## 2016-07-16 DIAGNOSIS — Z87891 Personal history of nicotine dependence: Secondary | ICD-10-CM | POA: Diagnosis not present

## 2016-09-02 DIAGNOSIS — Z6841 Body Mass Index (BMI) 40.0 and over, adult: Secondary | ICD-10-CM | POA: Diagnosis not present

## 2016-09-02 DIAGNOSIS — I10 Essential (primary) hypertension: Secondary | ICD-10-CM | POA: Diagnosis not present

## 2016-09-02 DIAGNOSIS — Z9989 Dependence on other enabling machines and devices: Secondary | ICD-10-CM | POA: Diagnosis not present

## 2016-09-02 DIAGNOSIS — G4733 Obstructive sleep apnea (adult) (pediatric): Secondary | ICD-10-CM | POA: Diagnosis not present

## 2016-09-06 DIAGNOSIS — E1169 Type 2 diabetes mellitus with other specified complication: Secondary | ICD-10-CM | POA: Diagnosis not present

## 2016-09-06 DIAGNOSIS — I10 Essential (primary) hypertension: Secondary | ICD-10-CM | POA: Diagnosis not present

## 2016-09-06 DIAGNOSIS — G4733 Obstructive sleep apnea (adult) (pediatric): Secondary | ICD-10-CM | POA: Diagnosis not present

## 2016-09-06 DIAGNOSIS — I491 Atrial premature depolarization: Secondary | ICD-10-CM | POA: Diagnosis not present

## 2016-09-06 DIAGNOSIS — E782 Mixed hyperlipidemia: Secondary | ICD-10-CM | POA: Diagnosis not present

## 2016-09-28 DIAGNOSIS — E1142 Type 2 diabetes mellitus with diabetic polyneuropathy: Secondary | ICD-10-CM | POA: Insufficient documentation

## 2016-10-08 DIAGNOSIS — I493 Ventricular premature depolarization: Secondary | ICD-10-CM | POA: Insufficient documentation

## 2016-10-08 DIAGNOSIS — Z23 Encounter for immunization: Secondary | ICD-10-CM | POA: Diagnosis not present

## 2016-10-08 DIAGNOSIS — I10 Essential (primary) hypertension: Secondary | ICD-10-CM | POA: Diagnosis not present

## 2016-10-08 DIAGNOSIS — E1142 Type 2 diabetes mellitus with diabetic polyneuropathy: Secondary | ICD-10-CM | POA: Diagnosis not present

## 2016-10-18 DIAGNOSIS — Z23 Encounter for immunization: Secondary | ICD-10-CM | POA: Diagnosis not present

## 2016-11-02 DIAGNOSIS — Z01818 Encounter for other preprocedural examination: Secondary | ICD-10-CM | POA: Diagnosis not present

## 2016-11-03 DIAGNOSIS — M8580 Other specified disorders of bone density and structure, unspecified site: Secondary | ICD-10-CM | POA: Diagnosis not present

## 2016-11-03 DIAGNOSIS — I10 Essential (primary) hypertension: Secondary | ICD-10-CM | POA: Diagnosis not present

## 2016-11-03 DIAGNOSIS — E1142 Type 2 diabetes mellitus with diabetic polyneuropathy: Secondary | ICD-10-CM | POA: Diagnosis not present

## 2016-11-03 DIAGNOSIS — G4733 Obstructive sleep apnea (adult) (pediatric): Secondary | ICD-10-CM | POA: Diagnosis not present

## 2016-11-03 DIAGNOSIS — Z9989 Dependence on other enabling machines and devices: Secondary | ICD-10-CM | POA: Diagnosis not present

## 2016-11-03 DIAGNOSIS — E669 Obesity, unspecified: Secondary | ICD-10-CM | POA: Diagnosis not present

## 2016-11-04 DIAGNOSIS — Z6838 Body mass index (BMI) 38.0-38.9, adult: Secondary | ICD-10-CM | POA: Diagnosis not present

## 2016-11-04 DIAGNOSIS — E1142 Type 2 diabetes mellitus with diabetic polyneuropathy: Secondary | ICD-10-CM | POA: Diagnosis not present

## 2016-11-04 DIAGNOSIS — Z713 Dietary counseling and surveillance: Secondary | ICD-10-CM | POA: Diagnosis not present

## 2016-11-18 DIAGNOSIS — Z01818 Encounter for other preprocedural examination: Secondary | ICD-10-CM | POA: Diagnosis not present

## 2016-11-23 DIAGNOSIS — Z885 Allergy status to narcotic agent status: Secondary | ICD-10-CM | POA: Diagnosis not present

## 2016-11-23 DIAGNOSIS — G4733 Obstructive sleep apnea (adult) (pediatric): Secondary | ICD-10-CM | POA: Diagnosis present

## 2016-11-23 DIAGNOSIS — E785 Hyperlipidemia, unspecified: Secondary | ICD-10-CM | POA: Diagnosis present

## 2016-11-23 DIAGNOSIS — I10 Essential (primary) hypertension: Secondary | ICD-10-CM | POA: Diagnosis not present

## 2016-11-23 DIAGNOSIS — Z6837 Body mass index (BMI) 37.0-37.9, adult: Secondary | ICD-10-CM | POA: Diagnosis not present

## 2016-11-23 DIAGNOSIS — M8580 Other specified disorders of bone density and structure, unspecified site: Secondary | ICD-10-CM | POA: Diagnosis present

## 2016-11-23 DIAGNOSIS — Z888 Allergy status to other drugs, medicaments and biological substances status: Secondary | ICD-10-CM | POA: Diagnosis not present

## 2016-11-23 DIAGNOSIS — E1142 Type 2 diabetes mellitus with diabetic polyneuropathy: Secondary | ICD-10-CM | POA: Diagnosis present

## 2016-11-23 DIAGNOSIS — Z87891 Personal history of nicotine dependence: Secondary | ICD-10-CM | POA: Diagnosis not present

## 2016-11-23 DIAGNOSIS — Z7984 Long term (current) use of oral hypoglycemic drugs: Secondary | ICD-10-CM | POA: Diagnosis not present

## 2016-11-29 DIAGNOSIS — T8130XA Disruption of wound, unspecified, initial encounter: Secondary | ICD-10-CM | POA: Diagnosis not present

## 2016-11-29 DIAGNOSIS — R911 Solitary pulmonary nodule: Secondary | ICD-10-CM | POA: Diagnosis not present

## 2016-11-29 DIAGNOSIS — N132 Hydronephrosis with renal and ureteral calculous obstruction: Secondary | ICD-10-CM | POA: Diagnosis present

## 2016-11-29 DIAGNOSIS — A419 Sepsis, unspecified organism: Secondary | ICD-10-CM | POA: Diagnosis present

## 2016-11-29 DIAGNOSIS — M8580 Other specified disorders of bone density and structure, unspecified site: Secondary | ICD-10-CM | POA: Diagnosis present

## 2016-11-29 DIAGNOSIS — E785 Hyperlipidemia, unspecified: Secondary | ICD-10-CM | POA: Diagnosis present

## 2016-11-29 DIAGNOSIS — E1142 Type 2 diabetes mellitus with diabetic polyneuropathy: Secondary | ICD-10-CM | POA: Diagnosis present

## 2016-11-29 DIAGNOSIS — K631 Perforation of intestine (nontraumatic): Secondary | ICD-10-CM | POA: Diagnosis not present

## 2016-11-29 DIAGNOSIS — Z87891 Personal history of nicotine dependence: Secondary | ICD-10-CM | POA: Diagnosis not present

## 2016-11-29 DIAGNOSIS — R1084 Generalized abdominal pain: Secondary | ICD-10-CM | POA: Diagnosis not present

## 2016-11-29 DIAGNOSIS — K439 Ventral hernia without obstruction or gangrene: Secondary | ICD-10-CM | POA: Diagnosis not present

## 2016-11-29 DIAGNOSIS — D62 Acute posthemorrhagic anemia: Secondary | ICD-10-CM | POA: Diagnosis not present

## 2016-11-29 DIAGNOSIS — K8020 Calculus of gallbladder without cholecystitis without obstruction: Secondary | ICD-10-CM | POA: Diagnosis not present

## 2016-11-29 DIAGNOSIS — K9581 Infection due to other bariatric procedure: Secondary | ICD-10-CM | POA: Diagnosis not present

## 2016-11-29 DIAGNOSIS — Z6839 Body mass index (BMI) 39.0-39.9, adult: Secondary | ICD-10-CM | POA: Diagnosis not present

## 2016-11-29 DIAGNOSIS — B001 Herpesviral vesicular dermatitis: Secondary | ICD-10-CM | POA: Diagnosis present

## 2016-11-29 DIAGNOSIS — E440 Moderate protein-calorie malnutrition: Secondary | ICD-10-CM | POA: Diagnosis not present

## 2016-11-29 DIAGNOSIS — N202 Calculus of kidney with calculus of ureter: Secondary | ICD-10-CM | POA: Diagnosis not present

## 2016-11-29 DIAGNOSIS — J95821 Acute postprocedural respiratory failure: Secondary | ICD-10-CM | POA: Diagnosis present

## 2016-11-29 DIAGNOSIS — E1165 Type 2 diabetes mellitus with hyperglycemia: Secondary | ICD-10-CM | POA: Diagnosis present

## 2016-11-29 DIAGNOSIS — K219 Gastro-esophageal reflux disease without esophagitis: Secondary | ICD-10-CM | POA: Diagnosis present

## 2016-11-29 DIAGNOSIS — K660 Peritoneal adhesions (postprocedural) (postinfection): Secondary | ICD-10-CM | POA: Diagnosis present

## 2016-11-29 DIAGNOSIS — Z885 Allergy status to narcotic agent status: Secondary | ICD-10-CM | POA: Diagnosis not present

## 2016-11-29 DIAGNOSIS — T8132XA Disruption of internal operation (surgical) wound, not elsewhere classified, initial encounter: Secondary | ICD-10-CM | POA: Diagnosis not present

## 2016-11-29 DIAGNOSIS — R198 Other specified symptoms and signs involving the digestive system and abdomen: Secondary | ICD-10-CM | POA: Diagnosis not present

## 2016-11-29 DIAGNOSIS — J9811 Atelectasis: Secondary | ICD-10-CM | POA: Diagnosis not present

## 2016-11-29 DIAGNOSIS — N179 Acute kidney failure, unspecified: Secondary | ICD-10-CM | POA: Diagnosis present

## 2016-11-29 DIAGNOSIS — G4733 Obstructive sleep apnea (adult) (pediatric): Secondary | ICD-10-CM | POA: Diagnosis present

## 2016-11-29 DIAGNOSIS — K9589 Other complications of other bariatric procedure: Secondary | ICD-10-CM | POA: Diagnosis present

## 2016-11-29 DIAGNOSIS — E119 Type 2 diabetes mellitus without complications: Secondary | ICD-10-CM | POA: Diagnosis not present

## 2016-11-29 DIAGNOSIS — R05 Cough: Secondary | ICD-10-CM | POA: Diagnosis not present

## 2016-11-29 DIAGNOSIS — Z888 Allergy status to other drugs, medicaments and biological substances status: Secondary | ICD-10-CM | POA: Diagnosis not present

## 2016-11-29 DIAGNOSIS — I10 Essential (primary) hypertension: Secondary | ICD-10-CM | POA: Diagnosis present

## 2016-11-29 DIAGNOSIS — Z9889 Other specified postprocedural states: Secondary | ICD-10-CM | POA: Diagnosis not present

## 2016-11-29 DIAGNOSIS — Z4682 Encounter for fitting and adjustment of non-vascular catheter: Secondary | ICD-10-CM | POA: Diagnosis not present

## 2016-11-29 DIAGNOSIS — K651 Peritoneal abscess: Secondary | ICD-10-CM | POA: Diagnosis not present

## 2016-11-29 DIAGNOSIS — Z5331 Laparoscopic surgical procedure converted to open procedure: Secondary | ICD-10-CM | POA: Diagnosis not present

## 2016-11-29 DIAGNOSIS — Z9884 Bariatric surgery status: Secondary | ICD-10-CM | POA: Diagnosis not present

## 2016-11-29 DIAGNOSIS — T814XXA Infection following a procedure, initial encounter: Secondary | ICD-10-CM | POA: Diagnosis not present

## 2016-11-29 DIAGNOSIS — R509 Fever, unspecified: Secondary | ICD-10-CM | POA: Diagnosis not present

## 2016-11-29 DIAGNOSIS — K632 Fistula of intestine: Secondary | ICD-10-CM | POA: Diagnosis not present

## 2016-11-29 DIAGNOSIS — B377 Candidal sepsis: Secondary | ICD-10-CM | POA: Diagnosis not present

## 2016-11-29 DIAGNOSIS — Z452 Encounter for adjustment and management of vascular access device: Secondary | ICD-10-CM | POA: Diagnosis not present

## 2016-11-29 DIAGNOSIS — J9600 Acute respiratory failure, unspecified whether with hypoxia or hypercapnia: Secondary | ICD-10-CM | POA: Diagnosis not present

## 2016-12-03 DIAGNOSIS — E440 Moderate protein-calorie malnutrition: Secondary | ICD-10-CM | POA: Insufficient documentation

## 2016-12-11 DIAGNOSIS — E1169 Type 2 diabetes mellitus with other specified complication: Secondary | ICD-10-CM | POA: Diagnosis not present

## 2016-12-11 DIAGNOSIS — I10 Essential (primary) hypertension: Secondary | ICD-10-CM | POA: Diagnosis not present

## 2016-12-11 DIAGNOSIS — K219 Gastro-esophageal reflux disease without esophagitis: Secondary | ICD-10-CM | POA: Diagnosis not present

## 2016-12-11 DIAGNOSIS — Z87891 Personal history of nicotine dependence: Secondary | ICD-10-CM | POA: Diagnosis not present

## 2016-12-11 DIAGNOSIS — E1142 Type 2 diabetes mellitus with diabetic polyneuropathy: Secondary | ICD-10-CM | POA: Diagnosis not present

## 2016-12-11 DIAGNOSIS — E440 Moderate protein-calorie malnutrition: Secondary | ICD-10-CM | POA: Diagnosis not present

## 2016-12-11 DIAGNOSIS — Z792 Long term (current) use of antibiotics: Secondary | ICD-10-CM | POA: Diagnosis not present

## 2016-12-11 DIAGNOSIS — Z452 Encounter for adjustment and management of vascular access device: Secondary | ICD-10-CM | POA: Diagnosis not present

## 2016-12-11 DIAGNOSIS — E785 Hyperlipidemia, unspecified: Secondary | ICD-10-CM | POA: Diagnosis not present

## 2016-12-11 DIAGNOSIS — G4733 Obstructive sleep apnea (adult) (pediatric): Secondary | ICD-10-CM | POA: Diagnosis not present

## 2016-12-11 DIAGNOSIS — T8131XD Disruption of external operation (surgical) wound, not elsewhere classified, subsequent encounter: Secondary | ICD-10-CM | POA: Diagnosis not present

## 2016-12-12 DIAGNOSIS — E440 Moderate protein-calorie malnutrition: Secondary | ICD-10-CM | POA: Diagnosis not present

## 2016-12-12 DIAGNOSIS — Z79899 Other long term (current) drug therapy: Secondary | ICD-10-CM | POA: Diagnosis not present

## 2016-12-12 DIAGNOSIS — I10 Essential (primary) hypertension: Secondary | ICD-10-CM | POA: Diagnosis not present

## 2016-12-12 DIAGNOSIS — K219 Gastro-esophageal reflux disease without esophagitis: Secondary | ICD-10-CM | POA: Diagnosis not present

## 2016-12-12 DIAGNOSIS — T8131XD Disruption of external operation (surgical) wound, not elsewhere classified, subsequent encounter: Secondary | ICD-10-CM | POA: Diagnosis not present

## 2016-12-12 DIAGNOSIS — E1142 Type 2 diabetes mellitus with diabetic polyneuropathy: Secondary | ICD-10-CM | POA: Diagnosis not present

## 2016-12-13 DIAGNOSIS — I10 Essential (primary) hypertension: Secondary | ICD-10-CM | POA: Diagnosis not present

## 2016-12-13 DIAGNOSIS — E1142 Type 2 diabetes mellitus with diabetic polyneuropathy: Secondary | ICD-10-CM | POA: Diagnosis not present

## 2016-12-13 DIAGNOSIS — K219 Gastro-esophageal reflux disease without esophagitis: Secondary | ICD-10-CM | POA: Diagnosis not present

## 2016-12-13 DIAGNOSIS — T8131XD Disruption of external operation (surgical) wound, not elsewhere classified, subsequent encounter: Secondary | ICD-10-CM | POA: Diagnosis not present

## 2016-12-13 DIAGNOSIS — E440 Moderate protein-calorie malnutrition: Secondary | ICD-10-CM | POA: Diagnosis not present

## 2016-12-14 DIAGNOSIS — K219 Gastro-esophageal reflux disease without esophagitis: Secondary | ICD-10-CM | POA: Diagnosis not present

## 2016-12-14 DIAGNOSIS — E1142 Type 2 diabetes mellitus with diabetic polyneuropathy: Secondary | ICD-10-CM | POA: Diagnosis not present

## 2016-12-14 DIAGNOSIS — T8131XD Disruption of external operation (surgical) wound, not elsewhere classified, subsequent encounter: Secondary | ICD-10-CM | POA: Diagnosis not present

## 2016-12-14 DIAGNOSIS — E440 Moderate protein-calorie malnutrition: Secondary | ICD-10-CM | POA: Diagnosis not present

## 2016-12-14 DIAGNOSIS — I10 Essential (primary) hypertension: Secondary | ICD-10-CM | POA: Diagnosis not present

## 2016-12-15 DIAGNOSIS — T8131XD Disruption of external operation (surgical) wound, not elsewhere classified, subsequent encounter: Secondary | ICD-10-CM | POA: Diagnosis not present

## 2016-12-15 DIAGNOSIS — E1142 Type 2 diabetes mellitus with diabetic polyneuropathy: Secondary | ICD-10-CM | POA: Diagnosis not present

## 2016-12-15 DIAGNOSIS — K219 Gastro-esophageal reflux disease without esophagitis: Secondary | ICD-10-CM | POA: Diagnosis not present

## 2016-12-15 DIAGNOSIS — I10 Essential (primary) hypertension: Secondary | ICD-10-CM | POA: Diagnosis not present

## 2016-12-15 DIAGNOSIS — E440 Moderate protein-calorie malnutrition: Secondary | ICD-10-CM | POA: Diagnosis not present

## 2016-12-16 DIAGNOSIS — T8131XD Disruption of external operation (surgical) wound, not elsewhere classified, subsequent encounter: Secondary | ICD-10-CM | POA: Diagnosis not present

## 2016-12-16 DIAGNOSIS — I10 Essential (primary) hypertension: Secondary | ICD-10-CM | POA: Diagnosis not present

## 2016-12-16 DIAGNOSIS — E1142 Type 2 diabetes mellitus with diabetic polyneuropathy: Secondary | ICD-10-CM | POA: Diagnosis not present

## 2016-12-16 DIAGNOSIS — K219 Gastro-esophageal reflux disease without esophagitis: Secondary | ICD-10-CM | POA: Diagnosis not present

## 2016-12-16 DIAGNOSIS — E440 Moderate protein-calorie malnutrition: Secondary | ICD-10-CM | POA: Diagnosis not present

## 2016-12-17 DIAGNOSIS — E440 Moderate protein-calorie malnutrition: Secondary | ICD-10-CM | POA: Diagnosis not present

## 2016-12-17 DIAGNOSIS — I10 Essential (primary) hypertension: Secondary | ICD-10-CM | POA: Diagnosis not present

## 2016-12-17 DIAGNOSIS — K219 Gastro-esophageal reflux disease without esophagitis: Secondary | ICD-10-CM | POA: Diagnosis not present

## 2016-12-17 DIAGNOSIS — T8131XD Disruption of external operation (surgical) wound, not elsewhere classified, subsequent encounter: Secondary | ICD-10-CM | POA: Diagnosis not present

## 2016-12-17 DIAGNOSIS — E1142 Type 2 diabetes mellitus with diabetic polyneuropathy: Secondary | ICD-10-CM | POA: Diagnosis not present

## 2016-12-20 DIAGNOSIS — T8131XD Disruption of external operation (surgical) wound, not elsewhere classified, subsequent encounter: Secondary | ICD-10-CM | POA: Diagnosis not present

## 2016-12-20 DIAGNOSIS — I10 Essential (primary) hypertension: Secondary | ICD-10-CM | POA: Diagnosis not present

## 2016-12-20 DIAGNOSIS — E1142 Type 2 diabetes mellitus with diabetic polyneuropathy: Secondary | ICD-10-CM | POA: Diagnosis not present

## 2016-12-20 DIAGNOSIS — K219 Gastro-esophageal reflux disease without esophagitis: Secondary | ICD-10-CM | POA: Diagnosis not present

## 2016-12-20 DIAGNOSIS — A419 Sepsis, unspecified organism: Secondary | ICD-10-CM | POA: Diagnosis not present

## 2016-12-20 DIAGNOSIS — E440 Moderate protein-calorie malnutrition: Secondary | ICD-10-CM | POA: Diagnosis not present

## 2016-12-21 DIAGNOSIS — E440 Moderate protein-calorie malnutrition: Secondary | ICD-10-CM | POA: Diagnosis not present

## 2016-12-21 DIAGNOSIS — E1142 Type 2 diabetes mellitus with diabetic polyneuropathy: Secondary | ICD-10-CM | POA: Diagnosis not present

## 2016-12-21 DIAGNOSIS — K219 Gastro-esophageal reflux disease without esophagitis: Secondary | ICD-10-CM | POA: Diagnosis not present

## 2016-12-21 DIAGNOSIS — I10 Essential (primary) hypertension: Secondary | ICD-10-CM | POA: Diagnosis not present

## 2016-12-21 DIAGNOSIS — T8131XD Disruption of external operation (surgical) wound, not elsewhere classified, subsequent encounter: Secondary | ICD-10-CM | POA: Diagnosis not present

## 2016-12-22 DIAGNOSIS — E1142 Type 2 diabetes mellitus with diabetic polyneuropathy: Secondary | ICD-10-CM | POA: Diagnosis not present

## 2016-12-22 DIAGNOSIS — K219 Gastro-esophageal reflux disease without esophagitis: Secondary | ICD-10-CM | POA: Diagnosis not present

## 2016-12-22 DIAGNOSIS — K631 Perforation of intestine (nontraumatic): Secondary | ICD-10-CM | POA: Diagnosis not present

## 2016-12-22 DIAGNOSIS — E440 Moderate protein-calorie malnutrition: Secondary | ICD-10-CM | POA: Diagnosis not present

## 2016-12-22 DIAGNOSIS — B377 Candidal sepsis: Secondary | ICD-10-CM | POA: Diagnosis not present

## 2016-12-22 DIAGNOSIS — T8131XD Disruption of external operation (surgical) wound, not elsewhere classified, subsequent encounter: Secondary | ICD-10-CM | POA: Diagnosis not present

## 2016-12-22 DIAGNOSIS — I10 Essential (primary) hypertension: Secondary | ICD-10-CM | POA: Diagnosis not present

## 2016-12-23 DIAGNOSIS — T8131XD Disruption of external operation (surgical) wound, not elsewhere classified, subsequent encounter: Secondary | ICD-10-CM | POA: Diagnosis not present

## 2016-12-23 DIAGNOSIS — E440 Moderate protein-calorie malnutrition: Secondary | ICD-10-CM | POA: Diagnosis not present

## 2016-12-23 DIAGNOSIS — E1142 Type 2 diabetes mellitus with diabetic polyneuropathy: Secondary | ICD-10-CM | POA: Diagnosis not present

## 2016-12-23 DIAGNOSIS — K219 Gastro-esophageal reflux disease without esophagitis: Secondary | ICD-10-CM | POA: Diagnosis not present

## 2016-12-23 DIAGNOSIS — I10 Essential (primary) hypertension: Secondary | ICD-10-CM | POA: Diagnosis not present

## 2016-12-24 DIAGNOSIS — E440 Moderate protein-calorie malnutrition: Secondary | ICD-10-CM | POA: Diagnosis not present

## 2016-12-24 DIAGNOSIS — K219 Gastro-esophageal reflux disease without esophagitis: Secondary | ICD-10-CM | POA: Diagnosis not present

## 2016-12-24 DIAGNOSIS — T8131XD Disruption of external operation (surgical) wound, not elsewhere classified, subsequent encounter: Secondary | ICD-10-CM | POA: Diagnosis not present

## 2016-12-24 DIAGNOSIS — E1142 Type 2 diabetes mellitus with diabetic polyneuropathy: Secondary | ICD-10-CM | POA: Diagnosis not present

## 2016-12-24 DIAGNOSIS — I10 Essential (primary) hypertension: Secondary | ICD-10-CM | POA: Diagnosis not present

## 2016-12-27 DIAGNOSIS — E440 Moderate protein-calorie malnutrition: Secondary | ICD-10-CM | POA: Diagnosis not present

## 2016-12-27 DIAGNOSIS — K219 Gastro-esophageal reflux disease without esophagitis: Secondary | ICD-10-CM | POA: Diagnosis not present

## 2016-12-27 DIAGNOSIS — I10 Essential (primary) hypertension: Secondary | ICD-10-CM | POA: Diagnosis not present

## 2016-12-27 DIAGNOSIS — T8131XD Disruption of external operation (surgical) wound, not elsewhere classified, subsequent encounter: Secondary | ICD-10-CM | POA: Diagnosis not present

## 2016-12-27 DIAGNOSIS — E1142 Type 2 diabetes mellitus with diabetic polyneuropathy: Secondary | ICD-10-CM | POA: Diagnosis not present

## 2016-12-28 DIAGNOSIS — T8131XD Disruption of external operation (surgical) wound, not elsewhere classified, subsequent encounter: Secondary | ICD-10-CM | POA: Diagnosis not present

## 2016-12-28 DIAGNOSIS — E440 Moderate protein-calorie malnutrition: Secondary | ICD-10-CM | POA: Diagnosis not present

## 2016-12-28 DIAGNOSIS — I10 Essential (primary) hypertension: Secondary | ICD-10-CM | POA: Diagnosis not present

## 2016-12-28 DIAGNOSIS — E1142 Type 2 diabetes mellitus with diabetic polyneuropathy: Secondary | ICD-10-CM | POA: Diagnosis not present

## 2016-12-28 DIAGNOSIS — K219 Gastro-esophageal reflux disease without esophagitis: Secondary | ICD-10-CM | POA: Diagnosis not present

## 2016-12-29 DIAGNOSIS — T8131XD Disruption of external operation (surgical) wound, not elsewhere classified, subsequent encounter: Secondary | ICD-10-CM | POA: Diagnosis not present

## 2016-12-29 DIAGNOSIS — K219 Gastro-esophageal reflux disease without esophagitis: Secondary | ICD-10-CM | POA: Diagnosis not present

## 2016-12-29 DIAGNOSIS — E440 Moderate protein-calorie malnutrition: Secondary | ICD-10-CM | POA: Diagnosis not present

## 2016-12-29 DIAGNOSIS — K631 Perforation of intestine (nontraumatic): Secondary | ICD-10-CM | POA: Diagnosis not present

## 2016-12-29 DIAGNOSIS — I10 Essential (primary) hypertension: Secondary | ICD-10-CM | POA: Diagnosis not present

## 2016-12-29 DIAGNOSIS — E1142 Type 2 diabetes mellitus with diabetic polyneuropathy: Secondary | ICD-10-CM | POA: Diagnosis not present

## 2016-12-30 DIAGNOSIS — E1142 Type 2 diabetes mellitus with diabetic polyneuropathy: Secondary | ICD-10-CM | POA: Diagnosis not present

## 2016-12-30 DIAGNOSIS — E440 Moderate protein-calorie malnutrition: Secondary | ICD-10-CM | POA: Diagnosis not present

## 2016-12-30 DIAGNOSIS — K219 Gastro-esophageal reflux disease without esophagitis: Secondary | ICD-10-CM | POA: Diagnosis not present

## 2016-12-30 DIAGNOSIS — T8131XD Disruption of external operation (surgical) wound, not elsewhere classified, subsequent encounter: Secondary | ICD-10-CM | POA: Diagnosis not present

## 2016-12-30 DIAGNOSIS — I10 Essential (primary) hypertension: Secondary | ICD-10-CM | POA: Diagnosis not present

## 2016-12-31 DIAGNOSIS — E1142 Type 2 diabetes mellitus with diabetic polyneuropathy: Secondary | ICD-10-CM | POA: Diagnosis not present

## 2016-12-31 DIAGNOSIS — E440 Moderate protein-calorie malnutrition: Secondary | ICD-10-CM | POA: Diagnosis not present

## 2016-12-31 DIAGNOSIS — K219 Gastro-esophageal reflux disease without esophagitis: Secondary | ICD-10-CM | POA: Diagnosis not present

## 2016-12-31 DIAGNOSIS — I10 Essential (primary) hypertension: Secondary | ICD-10-CM | POA: Diagnosis not present

## 2016-12-31 DIAGNOSIS — T8131XD Disruption of external operation (surgical) wound, not elsewhere classified, subsequent encounter: Secondary | ICD-10-CM | POA: Diagnosis not present

## 2017-01-01 DIAGNOSIS — E1142 Type 2 diabetes mellitus with diabetic polyneuropathy: Secondary | ICD-10-CM | POA: Diagnosis not present

## 2017-01-01 DIAGNOSIS — I10 Essential (primary) hypertension: Secondary | ICD-10-CM | POA: Diagnosis not present

## 2017-01-01 DIAGNOSIS — K219 Gastro-esophageal reflux disease without esophagitis: Secondary | ICD-10-CM | POA: Diagnosis not present

## 2017-01-01 DIAGNOSIS — E440 Moderate protein-calorie malnutrition: Secondary | ICD-10-CM | POA: Diagnosis not present

## 2017-01-01 DIAGNOSIS — T8131XD Disruption of external operation (surgical) wound, not elsewhere classified, subsequent encounter: Secondary | ICD-10-CM | POA: Diagnosis not present

## 2017-01-02 DIAGNOSIS — E440 Moderate protein-calorie malnutrition: Secondary | ICD-10-CM | POA: Diagnosis not present

## 2017-01-02 DIAGNOSIS — K219 Gastro-esophageal reflux disease without esophagitis: Secondary | ICD-10-CM | POA: Diagnosis not present

## 2017-01-02 DIAGNOSIS — T8131XD Disruption of external operation (surgical) wound, not elsewhere classified, subsequent encounter: Secondary | ICD-10-CM | POA: Diagnosis not present

## 2017-01-02 DIAGNOSIS — I10 Essential (primary) hypertension: Secondary | ICD-10-CM | POA: Diagnosis not present

## 2017-01-02 DIAGNOSIS — E1142 Type 2 diabetes mellitus with diabetic polyneuropathy: Secondary | ICD-10-CM | POA: Diagnosis not present

## 2017-01-03 DIAGNOSIS — T8131XD Disruption of external operation (surgical) wound, not elsewhere classified, subsequent encounter: Secondary | ICD-10-CM | POA: Diagnosis not present

## 2017-01-03 DIAGNOSIS — E1169 Type 2 diabetes mellitus with other specified complication: Secondary | ICD-10-CM | POA: Diagnosis not present

## 2017-01-03 DIAGNOSIS — K219 Gastro-esophageal reflux disease without esophagitis: Secondary | ICD-10-CM | POA: Diagnosis not present

## 2017-01-03 DIAGNOSIS — Z87891 Personal history of nicotine dependence: Secondary | ICD-10-CM | POA: Diagnosis not present

## 2017-01-03 DIAGNOSIS — E785 Hyperlipidemia, unspecified: Secondary | ICD-10-CM | POA: Diagnosis not present

## 2017-01-03 DIAGNOSIS — I10 Essential (primary) hypertension: Secondary | ICD-10-CM | POA: Diagnosis not present

## 2017-01-03 DIAGNOSIS — Z452 Encounter for adjustment and management of vascular access device: Secondary | ICD-10-CM | POA: Diagnosis not present

## 2017-01-03 DIAGNOSIS — E440 Moderate protein-calorie malnutrition: Secondary | ICD-10-CM | POA: Diagnosis not present

## 2017-01-03 DIAGNOSIS — E1142 Type 2 diabetes mellitus with diabetic polyneuropathy: Secondary | ICD-10-CM | POA: Diagnosis not present

## 2017-01-03 DIAGNOSIS — Z792 Long term (current) use of antibiotics: Secondary | ICD-10-CM | POA: Diagnosis not present

## 2017-01-03 DIAGNOSIS — G4733 Obstructive sleep apnea (adult) (pediatric): Secondary | ICD-10-CM | POA: Diagnosis not present

## 2017-01-04 DIAGNOSIS — E1142 Type 2 diabetes mellitus with diabetic polyneuropathy: Secondary | ICD-10-CM | POA: Diagnosis not present

## 2017-01-04 DIAGNOSIS — K219 Gastro-esophageal reflux disease without esophagitis: Secondary | ICD-10-CM | POA: Diagnosis not present

## 2017-01-04 DIAGNOSIS — T8131XD Disruption of external operation (surgical) wound, not elsewhere classified, subsequent encounter: Secondary | ICD-10-CM | POA: Diagnosis not present

## 2017-01-04 DIAGNOSIS — I10 Essential (primary) hypertension: Secondary | ICD-10-CM | POA: Diagnosis not present

## 2017-01-04 DIAGNOSIS — E440 Moderate protein-calorie malnutrition: Secondary | ICD-10-CM | POA: Diagnosis not present

## 2017-01-05 DIAGNOSIS — K219 Gastro-esophageal reflux disease without esophagitis: Secondary | ICD-10-CM | POA: Diagnosis not present

## 2017-01-05 DIAGNOSIS — K631 Perforation of intestine (nontraumatic): Secondary | ICD-10-CM | POA: Diagnosis not present

## 2017-01-05 DIAGNOSIS — T8131XD Disruption of external operation (surgical) wound, not elsewhere classified, subsequent encounter: Secondary | ICD-10-CM | POA: Diagnosis not present

## 2017-01-05 DIAGNOSIS — I10 Essential (primary) hypertension: Secondary | ICD-10-CM | POA: Diagnosis not present

## 2017-01-05 DIAGNOSIS — E440 Moderate protein-calorie malnutrition: Secondary | ICD-10-CM | POA: Diagnosis not present

## 2017-01-05 DIAGNOSIS — E1142 Type 2 diabetes mellitus with diabetic polyneuropathy: Secondary | ICD-10-CM | POA: Diagnosis not present

## 2017-01-06 DIAGNOSIS — E440 Moderate protein-calorie malnutrition: Secondary | ICD-10-CM | POA: Diagnosis not present

## 2017-01-06 DIAGNOSIS — K219 Gastro-esophageal reflux disease without esophagitis: Secondary | ICD-10-CM | POA: Diagnosis not present

## 2017-01-06 DIAGNOSIS — E1142 Type 2 diabetes mellitus with diabetic polyneuropathy: Secondary | ICD-10-CM | POA: Diagnosis not present

## 2017-01-06 DIAGNOSIS — I10 Essential (primary) hypertension: Secondary | ICD-10-CM | POA: Diagnosis not present

## 2017-01-06 DIAGNOSIS — T8131XD Disruption of external operation (surgical) wound, not elsewhere classified, subsequent encounter: Secondary | ICD-10-CM | POA: Diagnosis not present

## 2017-01-07 DIAGNOSIS — T8131XD Disruption of external operation (surgical) wound, not elsewhere classified, subsequent encounter: Secondary | ICD-10-CM | POA: Diagnosis not present

## 2017-01-07 DIAGNOSIS — K219 Gastro-esophageal reflux disease without esophagitis: Secondary | ICD-10-CM | POA: Diagnosis not present

## 2017-01-07 DIAGNOSIS — I10 Essential (primary) hypertension: Secondary | ICD-10-CM | POA: Diagnosis not present

## 2017-01-07 DIAGNOSIS — E1142 Type 2 diabetes mellitus with diabetic polyneuropathy: Secondary | ICD-10-CM | POA: Diagnosis not present

## 2017-01-07 DIAGNOSIS — E440 Moderate protein-calorie malnutrition: Secondary | ICD-10-CM | POA: Diagnosis not present

## 2017-01-10 DIAGNOSIS — K631 Perforation of intestine (nontraumatic): Secondary | ICD-10-CM | POA: Diagnosis not present

## 2017-01-10 DIAGNOSIS — K219 Gastro-esophageal reflux disease without esophagitis: Secondary | ICD-10-CM | POA: Diagnosis not present

## 2017-01-10 DIAGNOSIS — I10 Essential (primary) hypertension: Secondary | ICD-10-CM | POA: Diagnosis not present

## 2017-01-10 DIAGNOSIS — G4733 Obstructive sleep apnea (adult) (pediatric): Secondary | ICD-10-CM | POA: Diagnosis not present

## 2017-01-10 DIAGNOSIS — E1142 Type 2 diabetes mellitus with diabetic polyneuropathy: Secondary | ICD-10-CM | POA: Diagnosis not present

## 2017-01-10 DIAGNOSIS — T8131XD Disruption of external operation (surgical) wound, not elsewhere classified, subsequent encounter: Secondary | ICD-10-CM | POA: Diagnosis not present

## 2017-01-10 DIAGNOSIS — Z9989 Dependence on other enabling machines and devices: Secondary | ICD-10-CM | POA: Diagnosis not present

## 2017-01-10 DIAGNOSIS — E440 Moderate protein-calorie malnutrition: Secondary | ICD-10-CM | POA: Diagnosis not present

## 2017-01-12 DIAGNOSIS — T8131XD Disruption of external operation (surgical) wound, not elsewhere classified, subsequent encounter: Secondary | ICD-10-CM | POA: Diagnosis not present

## 2017-01-12 DIAGNOSIS — E440 Moderate protein-calorie malnutrition: Secondary | ICD-10-CM | POA: Diagnosis not present

## 2017-01-12 DIAGNOSIS — E1142 Type 2 diabetes mellitus with diabetic polyneuropathy: Secondary | ICD-10-CM | POA: Diagnosis not present

## 2017-01-12 DIAGNOSIS — I10 Essential (primary) hypertension: Secondary | ICD-10-CM | POA: Diagnosis not present

## 2017-01-12 DIAGNOSIS — K219 Gastro-esophageal reflux disease without esophagitis: Secondary | ICD-10-CM | POA: Diagnosis not present

## 2017-01-14 DIAGNOSIS — E440 Moderate protein-calorie malnutrition: Secondary | ICD-10-CM | POA: Diagnosis not present

## 2017-01-14 DIAGNOSIS — K219 Gastro-esophageal reflux disease without esophagitis: Secondary | ICD-10-CM | POA: Diagnosis not present

## 2017-01-14 DIAGNOSIS — I10 Essential (primary) hypertension: Secondary | ICD-10-CM | POA: Diagnosis not present

## 2017-01-14 DIAGNOSIS — E1142 Type 2 diabetes mellitus with diabetic polyneuropathy: Secondary | ICD-10-CM | POA: Diagnosis not present

## 2017-01-14 DIAGNOSIS — T8131XD Disruption of external operation (surgical) wound, not elsewhere classified, subsequent encounter: Secondary | ICD-10-CM | POA: Diagnosis not present

## 2017-01-17 DIAGNOSIS — T8131XD Disruption of external operation (surgical) wound, not elsewhere classified, subsequent encounter: Secondary | ICD-10-CM | POA: Diagnosis not present

## 2017-01-17 DIAGNOSIS — E1142 Type 2 diabetes mellitus with diabetic polyneuropathy: Secondary | ICD-10-CM | POA: Diagnosis not present

## 2017-01-17 DIAGNOSIS — E440 Moderate protein-calorie malnutrition: Secondary | ICD-10-CM | POA: Diagnosis not present

## 2017-01-17 DIAGNOSIS — K219 Gastro-esophageal reflux disease without esophagitis: Secondary | ICD-10-CM | POA: Diagnosis not present

## 2017-01-17 DIAGNOSIS — I10 Essential (primary) hypertension: Secondary | ICD-10-CM | POA: Diagnosis not present

## 2017-01-19 DIAGNOSIS — T8131XD Disruption of external operation (surgical) wound, not elsewhere classified, subsequent encounter: Secondary | ICD-10-CM | POA: Diagnosis not present

## 2017-01-19 DIAGNOSIS — E440 Moderate protein-calorie malnutrition: Secondary | ICD-10-CM | POA: Diagnosis not present

## 2017-01-19 DIAGNOSIS — E1142 Type 2 diabetes mellitus with diabetic polyneuropathy: Secondary | ICD-10-CM | POA: Diagnosis not present

## 2017-01-19 DIAGNOSIS — I10 Essential (primary) hypertension: Secondary | ICD-10-CM | POA: Diagnosis not present

## 2017-01-19 DIAGNOSIS — K219 Gastro-esophageal reflux disease without esophagitis: Secondary | ICD-10-CM | POA: Diagnosis not present

## 2017-01-21 DIAGNOSIS — K219 Gastro-esophageal reflux disease without esophagitis: Secondary | ICD-10-CM | POA: Diagnosis not present

## 2017-01-21 DIAGNOSIS — I10 Essential (primary) hypertension: Secondary | ICD-10-CM | POA: Diagnosis not present

## 2017-01-21 DIAGNOSIS — T8131XD Disruption of external operation (surgical) wound, not elsewhere classified, subsequent encounter: Secondary | ICD-10-CM | POA: Diagnosis not present

## 2017-01-21 DIAGNOSIS — E1142 Type 2 diabetes mellitus with diabetic polyneuropathy: Secondary | ICD-10-CM | POA: Diagnosis not present

## 2017-01-21 DIAGNOSIS — E440 Moderate protein-calorie malnutrition: Secondary | ICD-10-CM | POA: Diagnosis not present

## 2017-01-24 DIAGNOSIS — E440 Moderate protein-calorie malnutrition: Secondary | ICD-10-CM | POA: Diagnosis not present

## 2017-01-24 DIAGNOSIS — K219 Gastro-esophageal reflux disease without esophagitis: Secondary | ICD-10-CM | POA: Diagnosis not present

## 2017-01-24 DIAGNOSIS — I10 Essential (primary) hypertension: Secondary | ICD-10-CM | POA: Diagnosis not present

## 2017-01-24 DIAGNOSIS — T8131XD Disruption of external operation (surgical) wound, not elsewhere classified, subsequent encounter: Secondary | ICD-10-CM | POA: Diagnosis not present

## 2017-01-24 DIAGNOSIS — E1142 Type 2 diabetes mellitus with diabetic polyneuropathy: Secondary | ICD-10-CM | POA: Diagnosis not present

## 2017-01-26 DIAGNOSIS — E1142 Type 2 diabetes mellitus with diabetic polyneuropathy: Secondary | ICD-10-CM | POA: Diagnosis not present

## 2017-01-26 DIAGNOSIS — I10 Essential (primary) hypertension: Secondary | ICD-10-CM | POA: Diagnosis not present

## 2017-01-26 DIAGNOSIS — E440 Moderate protein-calorie malnutrition: Secondary | ICD-10-CM | POA: Diagnosis not present

## 2017-01-26 DIAGNOSIS — T8131XD Disruption of external operation (surgical) wound, not elsewhere classified, subsequent encounter: Secondary | ICD-10-CM | POA: Diagnosis not present

## 2017-01-26 DIAGNOSIS — K631 Perforation of intestine (nontraumatic): Secondary | ICD-10-CM | POA: Diagnosis not present

## 2017-01-26 DIAGNOSIS — K219 Gastro-esophageal reflux disease without esophagitis: Secondary | ICD-10-CM | POA: Diagnosis not present

## 2017-01-28 DIAGNOSIS — I10 Essential (primary) hypertension: Secondary | ICD-10-CM | POA: Diagnosis not present

## 2017-01-28 DIAGNOSIS — K219 Gastro-esophageal reflux disease without esophagitis: Secondary | ICD-10-CM | POA: Diagnosis not present

## 2017-01-28 DIAGNOSIS — E440 Moderate protein-calorie malnutrition: Secondary | ICD-10-CM | POA: Diagnosis not present

## 2017-01-28 DIAGNOSIS — E1142 Type 2 diabetes mellitus with diabetic polyneuropathy: Secondary | ICD-10-CM | POA: Diagnosis not present

## 2017-01-28 DIAGNOSIS — T8131XD Disruption of external operation (surgical) wound, not elsewhere classified, subsequent encounter: Secondary | ICD-10-CM | POA: Diagnosis not present

## 2017-01-31 DIAGNOSIS — K219 Gastro-esophageal reflux disease without esophagitis: Secondary | ICD-10-CM | POA: Diagnosis not present

## 2017-01-31 DIAGNOSIS — I10 Essential (primary) hypertension: Secondary | ICD-10-CM | POA: Diagnosis not present

## 2017-01-31 DIAGNOSIS — E1142 Type 2 diabetes mellitus with diabetic polyneuropathy: Secondary | ICD-10-CM | POA: Diagnosis not present

## 2017-01-31 DIAGNOSIS — T8131XD Disruption of external operation (surgical) wound, not elsewhere classified, subsequent encounter: Secondary | ICD-10-CM | POA: Diagnosis not present

## 2017-01-31 DIAGNOSIS — E440 Moderate protein-calorie malnutrition: Secondary | ICD-10-CM | POA: Diagnosis not present

## 2017-02-02 DIAGNOSIS — E1142 Type 2 diabetes mellitus with diabetic polyneuropathy: Secondary | ICD-10-CM | POA: Diagnosis not present

## 2017-02-02 DIAGNOSIS — E440 Moderate protein-calorie malnutrition: Secondary | ICD-10-CM | POA: Diagnosis not present

## 2017-02-02 DIAGNOSIS — K219 Gastro-esophageal reflux disease without esophagitis: Secondary | ICD-10-CM | POA: Diagnosis not present

## 2017-02-02 DIAGNOSIS — T8131XD Disruption of external operation (surgical) wound, not elsewhere classified, subsequent encounter: Secondary | ICD-10-CM | POA: Diagnosis not present

## 2017-02-02 DIAGNOSIS — I10 Essential (primary) hypertension: Secondary | ICD-10-CM | POA: Diagnosis not present

## 2017-02-04 DIAGNOSIS — E1142 Type 2 diabetes mellitus with diabetic polyneuropathy: Secondary | ICD-10-CM | POA: Diagnosis not present

## 2017-02-04 DIAGNOSIS — E440 Moderate protein-calorie malnutrition: Secondary | ICD-10-CM | POA: Diagnosis not present

## 2017-02-04 DIAGNOSIS — K219 Gastro-esophageal reflux disease without esophagitis: Secondary | ICD-10-CM | POA: Diagnosis not present

## 2017-02-04 DIAGNOSIS — I10 Essential (primary) hypertension: Secondary | ICD-10-CM | POA: Diagnosis not present

## 2017-02-04 DIAGNOSIS — T8131XD Disruption of external operation (surgical) wound, not elsewhere classified, subsequent encounter: Secondary | ICD-10-CM | POA: Diagnosis not present

## 2017-02-07 DIAGNOSIS — E440 Moderate protein-calorie malnutrition: Secondary | ICD-10-CM | POA: Diagnosis not present

## 2017-02-07 DIAGNOSIS — K219 Gastro-esophageal reflux disease without esophagitis: Secondary | ICD-10-CM | POA: Diagnosis not present

## 2017-02-07 DIAGNOSIS — E1142 Type 2 diabetes mellitus with diabetic polyneuropathy: Secondary | ICD-10-CM | POA: Diagnosis not present

## 2017-02-07 DIAGNOSIS — T8131XD Disruption of external operation (surgical) wound, not elsewhere classified, subsequent encounter: Secondary | ICD-10-CM | POA: Diagnosis not present

## 2017-02-07 DIAGNOSIS — I10 Essential (primary) hypertension: Secondary | ICD-10-CM | POA: Diagnosis not present

## 2017-02-09 DIAGNOSIS — T8131XA Disruption of external operation (surgical) wound, not elsewhere classified, initial encounter: Secondary | ICD-10-CM | POA: Diagnosis not present

## 2017-02-09 DIAGNOSIS — Z87891 Personal history of nicotine dependence: Secondary | ICD-10-CM | POA: Diagnosis not present

## 2017-02-09 DIAGNOSIS — E785 Hyperlipidemia, unspecified: Secondary | ICD-10-CM | POA: Diagnosis not present

## 2017-02-09 DIAGNOSIS — G4733 Obstructive sleep apnea (adult) (pediatric): Secondary | ICD-10-CM | POA: Diagnosis not present

## 2017-02-09 DIAGNOSIS — E1142 Type 2 diabetes mellitus with diabetic polyneuropathy: Secondary | ICD-10-CM | POA: Diagnosis not present

## 2017-02-09 DIAGNOSIS — E1169 Type 2 diabetes mellitus with other specified complication: Secondary | ICD-10-CM | POA: Diagnosis not present

## 2017-02-09 DIAGNOSIS — E440 Moderate protein-calorie malnutrition: Secondary | ICD-10-CM | POA: Diagnosis not present

## 2017-02-09 DIAGNOSIS — K219 Gastro-esophageal reflux disease without esophagitis: Secondary | ICD-10-CM | POA: Diagnosis not present

## 2017-02-09 DIAGNOSIS — I10 Essential (primary) hypertension: Secondary | ICD-10-CM | POA: Diagnosis not present

## 2017-02-11 DIAGNOSIS — K219 Gastro-esophageal reflux disease without esophagitis: Secondary | ICD-10-CM | POA: Diagnosis not present

## 2017-02-11 DIAGNOSIS — T8131XA Disruption of external operation (surgical) wound, not elsewhere classified, initial encounter: Secondary | ICD-10-CM | POA: Diagnosis not present

## 2017-02-11 DIAGNOSIS — E440 Moderate protein-calorie malnutrition: Secondary | ICD-10-CM | POA: Diagnosis not present

## 2017-02-11 DIAGNOSIS — E1142 Type 2 diabetes mellitus with diabetic polyneuropathy: Secondary | ICD-10-CM | POA: Diagnosis not present

## 2017-02-11 DIAGNOSIS — I10 Essential (primary) hypertension: Secondary | ICD-10-CM | POA: Diagnosis not present

## 2017-02-14 DIAGNOSIS — I10 Essential (primary) hypertension: Secondary | ICD-10-CM | POA: Diagnosis not present

## 2017-02-14 DIAGNOSIS — T8131XA Disruption of external operation (surgical) wound, not elsewhere classified, initial encounter: Secondary | ICD-10-CM | POA: Diagnosis not present

## 2017-02-14 DIAGNOSIS — E1142 Type 2 diabetes mellitus with diabetic polyneuropathy: Secondary | ICD-10-CM | POA: Diagnosis not present

## 2017-02-14 DIAGNOSIS — E440 Moderate protein-calorie malnutrition: Secondary | ICD-10-CM | POA: Diagnosis not present

## 2017-02-14 DIAGNOSIS — K219 Gastro-esophageal reflux disease without esophagitis: Secondary | ICD-10-CM | POA: Diagnosis not present

## 2017-02-16 DIAGNOSIS — E440 Moderate protein-calorie malnutrition: Secondary | ICD-10-CM | POA: Diagnosis not present

## 2017-02-16 DIAGNOSIS — K219 Gastro-esophageal reflux disease without esophagitis: Secondary | ICD-10-CM | POA: Diagnosis not present

## 2017-02-16 DIAGNOSIS — I10 Essential (primary) hypertension: Secondary | ICD-10-CM | POA: Diagnosis not present

## 2017-02-16 DIAGNOSIS — E1142 Type 2 diabetes mellitus with diabetic polyneuropathy: Secondary | ICD-10-CM | POA: Diagnosis not present

## 2017-02-16 DIAGNOSIS — T8131XA Disruption of external operation (surgical) wound, not elsewhere classified, initial encounter: Secondary | ICD-10-CM | POA: Diagnosis not present

## 2017-02-18 DIAGNOSIS — E440 Moderate protein-calorie malnutrition: Secondary | ICD-10-CM | POA: Diagnosis not present

## 2017-02-18 DIAGNOSIS — T8131XA Disruption of external operation (surgical) wound, not elsewhere classified, initial encounter: Secondary | ICD-10-CM | POA: Diagnosis not present

## 2017-02-18 DIAGNOSIS — K219 Gastro-esophageal reflux disease without esophagitis: Secondary | ICD-10-CM | POA: Diagnosis not present

## 2017-02-18 DIAGNOSIS — I10 Essential (primary) hypertension: Secondary | ICD-10-CM | POA: Diagnosis not present

## 2017-02-18 DIAGNOSIS — E1142 Type 2 diabetes mellitus with diabetic polyneuropathy: Secondary | ICD-10-CM | POA: Diagnosis not present

## 2017-02-21 DIAGNOSIS — K219 Gastro-esophageal reflux disease without esophagitis: Secondary | ICD-10-CM | POA: Diagnosis not present

## 2017-02-21 DIAGNOSIS — E1142 Type 2 diabetes mellitus with diabetic polyneuropathy: Secondary | ICD-10-CM | POA: Diagnosis not present

## 2017-02-21 DIAGNOSIS — T8131XA Disruption of external operation (surgical) wound, not elsewhere classified, initial encounter: Secondary | ICD-10-CM | POA: Diagnosis not present

## 2017-02-21 DIAGNOSIS — E440 Moderate protein-calorie malnutrition: Secondary | ICD-10-CM | POA: Diagnosis not present

## 2017-02-21 DIAGNOSIS — I10 Essential (primary) hypertension: Secondary | ICD-10-CM | POA: Diagnosis not present

## 2017-02-23 DIAGNOSIS — K219 Gastro-esophageal reflux disease without esophagitis: Secondary | ICD-10-CM | POA: Diagnosis not present

## 2017-02-23 DIAGNOSIS — E440 Moderate protein-calorie malnutrition: Secondary | ICD-10-CM | POA: Diagnosis not present

## 2017-02-23 DIAGNOSIS — I10 Essential (primary) hypertension: Secondary | ICD-10-CM | POA: Diagnosis not present

## 2017-02-23 DIAGNOSIS — E1142 Type 2 diabetes mellitus with diabetic polyneuropathy: Secondary | ICD-10-CM | POA: Diagnosis not present

## 2017-02-23 DIAGNOSIS — T8131XA Disruption of external operation (surgical) wound, not elsewhere classified, initial encounter: Secondary | ICD-10-CM | POA: Diagnosis not present

## 2017-02-25 DIAGNOSIS — E1142 Type 2 diabetes mellitus with diabetic polyneuropathy: Secondary | ICD-10-CM | POA: Diagnosis not present

## 2017-02-25 DIAGNOSIS — E440 Moderate protein-calorie malnutrition: Secondary | ICD-10-CM | POA: Diagnosis not present

## 2017-02-25 DIAGNOSIS — I10 Essential (primary) hypertension: Secondary | ICD-10-CM | POA: Diagnosis not present

## 2017-02-25 DIAGNOSIS — T8131XA Disruption of external operation (surgical) wound, not elsewhere classified, initial encounter: Secondary | ICD-10-CM | POA: Diagnosis not present

## 2017-02-25 DIAGNOSIS — K219 Gastro-esophageal reflux disease without esophagitis: Secondary | ICD-10-CM | POA: Diagnosis not present

## 2017-02-28 DIAGNOSIS — E1142 Type 2 diabetes mellitus with diabetic polyneuropathy: Secondary | ICD-10-CM | POA: Diagnosis not present

## 2017-02-28 DIAGNOSIS — K219 Gastro-esophageal reflux disease without esophagitis: Secondary | ICD-10-CM | POA: Diagnosis not present

## 2017-02-28 DIAGNOSIS — T8131XA Disruption of external operation (surgical) wound, not elsewhere classified, initial encounter: Secondary | ICD-10-CM | POA: Diagnosis not present

## 2017-02-28 DIAGNOSIS — I10 Essential (primary) hypertension: Secondary | ICD-10-CM | POA: Diagnosis not present

## 2017-02-28 DIAGNOSIS — E440 Moderate protein-calorie malnutrition: Secondary | ICD-10-CM | POA: Diagnosis not present

## 2017-03-01 DIAGNOSIS — E1142 Type 2 diabetes mellitus with diabetic polyneuropathy: Secondary | ICD-10-CM | POA: Diagnosis not present

## 2017-03-01 DIAGNOSIS — K909 Intestinal malabsorption, unspecified: Secondary | ICD-10-CM | POA: Diagnosis not present

## 2017-03-02 DIAGNOSIS — T8131XA Disruption of external operation (surgical) wound, not elsewhere classified, initial encounter: Secondary | ICD-10-CM | POA: Diagnosis not present

## 2017-03-02 DIAGNOSIS — K219 Gastro-esophageal reflux disease without esophagitis: Secondary | ICD-10-CM | POA: Diagnosis not present

## 2017-03-02 DIAGNOSIS — E1142 Type 2 diabetes mellitus with diabetic polyneuropathy: Secondary | ICD-10-CM | POA: Diagnosis not present

## 2017-03-02 DIAGNOSIS — I10 Essential (primary) hypertension: Secondary | ICD-10-CM | POA: Diagnosis not present

## 2017-03-02 DIAGNOSIS — E440 Moderate protein-calorie malnutrition: Secondary | ICD-10-CM | POA: Diagnosis not present

## 2017-03-03 DIAGNOSIS — E1142 Type 2 diabetes mellitus with diabetic polyneuropathy: Secondary | ICD-10-CM | POA: Diagnosis not present

## 2017-03-03 DIAGNOSIS — E440 Moderate protein-calorie malnutrition: Secondary | ICD-10-CM | POA: Diagnosis not present

## 2017-03-03 DIAGNOSIS — K219 Gastro-esophageal reflux disease without esophagitis: Secondary | ICD-10-CM | POA: Diagnosis not present

## 2017-03-03 DIAGNOSIS — I10 Essential (primary) hypertension: Secondary | ICD-10-CM | POA: Diagnosis not present

## 2017-03-03 DIAGNOSIS — T8131XA Disruption of external operation (surgical) wound, not elsewhere classified, initial encounter: Secondary | ICD-10-CM | POA: Diagnosis not present

## 2017-03-09 DIAGNOSIS — T148XXD Other injury of unspecified body region, subsequent encounter: Secondary | ICD-10-CM | POA: Insufficient documentation

## 2017-03-24 DIAGNOSIS — Z713 Dietary counseling and surveillance: Secondary | ICD-10-CM | POA: Diagnosis not present

## 2017-03-24 DIAGNOSIS — E669 Obesity, unspecified: Secondary | ICD-10-CM | POA: Diagnosis not present

## 2017-03-24 DIAGNOSIS — E1149 Type 2 diabetes mellitus with other diabetic neurological complication: Secondary | ICD-10-CM | POA: Diagnosis not present

## 2017-03-24 DIAGNOSIS — K909 Intestinal malabsorption, unspecified: Secondary | ICD-10-CM | POA: Diagnosis not present

## 2017-04-11 DIAGNOSIS — E782 Mixed hyperlipidemia: Secondary | ICD-10-CM | POA: Diagnosis not present

## 2017-04-11 DIAGNOSIS — I10 Essential (primary) hypertension: Secondary | ICD-10-CM | POA: Diagnosis not present

## 2017-04-11 DIAGNOSIS — E1169 Type 2 diabetes mellitus with other specified complication: Secondary | ICD-10-CM | POA: Diagnosis not present

## 2017-04-11 DIAGNOSIS — E1142 Type 2 diabetes mellitus with diabetic polyneuropathy: Secondary | ICD-10-CM | POA: Diagnosis not present

## 2017-04-19 DIAGNOSIS — E1169 Type 2 diabetes mellitus with other specified complication: Secondary | ICD-10-CM | POA: Diagnosis not present

## 2017-04-19 DIAGNOSIS — E782 Mixed hyperlipidemia: Secondary | ICD-10-CM | POA: Diagnosis not present

## 2017-04-19 DIAGNOSIS — E1142 Type 2 diabetes mellitus with diabetic polyneuropathy: Secondary | ICD-10-CM | POA: Diagnosis not present

## 2017-05-06 DIAGNOSIS — E1149 Type 2 diabetes mellitus with other diabetic neurological complication: Secondary | ICD-10-CM | POA: Diagnosis not present

## 2017-05-06 DIAGNOSIS — I10 Essential (primary) hypertension: Secondary | ICD-10-CM | POA: Diagnosis not present

## 2017-05-06 DIAGNOSIS — K909 Intestinal malabsorption, unspecified: Secondary | ICD-10-CM | POA: Diagnosis not present

## 2017-05-26 ENCOUNTER — Ambulatory Visit (INDEPENDENT_AMBULATORY_CARE_PROVIDER_SITE_OTHER): Payer: Medicare Other | Admitting: Obstetrics & Gynecology

## 2017-05-26 ENCOUNTER — Encounter: Payer: Self-pay | Admitting: Obstetrics & Gynecology

## 2017-05-26 VITALS — BP 134/88 | Ht 66.0 in | Wt 215.0 lb

## 2017-05-26 DIAGNOSIS — Z1231 Encounter for screening mammogram for malignant neoplasm of breast: Secondary | ICD-10-CM | POA: Diagnosis not present

## 2017-05-26 DIAGNOSIS — E6609 Other obesity due to excess calories: Secondary | ICD-10-CM

## 2017-05-26 DIAGNOSIS — Z6834 Body mass index (BMI) 34.0-34.9, adult: Secondary | ICD-10-CM | POA: Diagnosis not present

## 2017-05-26 DIAGNOSIS — Z9884 Bariatric surgery status: Secondary | ICD-10-CM | POA: Diagnosis not present

## 2017-05-26 DIAGNOSIS — Z01411 Encounter for gynecological examination (general) (routine) with abnormal findings: Secondary | ICD-10-CM | POA: Diagnosis not present

## 2017-05-26 DIAGNOSIS — Z780 Asymptomatic menopausal state: Secondary | ICD-10-CM

## 2017-05-26 DIAGNOSIS — Z124 Encounter for screening for malignant neoplasm of cervix: Secondary | ICD-10-CM | POA: Diagnosis not present

## 2017-05-26 DIAGNOSIS — B372 Candidiasis of skin and nail: Secondary | ICD-10-CM

## 2017-05-26 MED ORDER — NYSTATIN 100000 UNIT/GM EX POWD: Freq: Four times a day (QID) | CUTANEOUS | 3 refills | Status: DC

## 2017-05-26 NOTE — Patient Instructions (Signed)
1. Encounter for gynecological examination with abnormal finding Normal gyn exam except Atrophic Vaginitis, but asymptomatic as patient is abstinent.  Pap reflex done.  Breasts wnl.  Pending screening Mammo.  2. Menopause present No HRT.  No PMB.  Vit D supplement recommended.  Ca++ in nutrition.  Weight bearing physical activity.  Will f/u Bone Density at 2 years.  3. Class 1 obesity due to excess calories with serious comorbidity and body mass index (BMI) of 34.0 to 34.9 in adult Planning to be back on Bariatric diet.  Physical activity with weight lifting recommended.  4. Bariatric surgery status   BMI 34  5. Yeast of skin At lower abdominal pannus.  Nystatin powder prescribed.    Marilyn Myers, it was a pleasure to see you today!  I will inform you of your results as soon as available.

## 2017-05-26 NOTE — Progress Notes (Signed)
Marilyn Myers 08-06-1951 [id number]   History:    66 y.o.  G3P3 Married.    RP:  Established patient presenting for annual gyn exam   HPI:  Menopause.  No HRT.  No PMB.  No pelvic pain.  Abstinence x many years.  Breasts wnl.  Mictions/BMs wnl.  Per patient, Bariatric Surgery/Cx Colon resection/Colostomy x 6 mths and  diverticulitis 11/2016, abdominal incision healing by second intention.  CHTN, DM followed by Aurelio Brash MD.  Past medical history,surgical history, family history and social history were all reviewed and documented in the EPIC chart.  Gynecologic History No LMP recorded. Patient is postmenopausal. Contraception: post menopausal status.  Tubal Ligation Last Pap: 2015. Results were: normal per patient Last mammogram: 05/26/2017. Results were: Pending  Obstetric History G3P3  ROS: A ROS was performed and pertinent positives and negatives are included in the history.  GENERAL: No fevers or chills. HEENT: No change in vision, no earache, sore throat or sinus congestion. NECK: No pain or stiffness. CARDIOVASCULAR: No chest pain or pressure. No palpitations. PULMONARY: No shortness of breath, cough or wheeze. GASTROINTESTINAL: No abdominal pain, nausea, vomiting or diarrhea, melena or bright red blood per rectum. GENITOURINARY: No urinary frequency, urgency, hesitancy or dysuria. MUSCULOSKELETAL: No joint or muscle pain, no back pain, no recent trauma. DERMATOLOGIC: No rash, no itching, no lesions. ENDOCRINE: No polyuria, polydipsia, no heat or cold intolerance. No recent change in weight. HEMATOLOGICAL: No anemia or easy bruising or bleeding. NEUROLOGIC: No headache, seizures, numbness, tingling or weakness. PSYCHIATRIC: No depression, no loss of interest in normal activity or change in sleep pattern.     Exam:   Ht 5\' 6"  (1.676 m)   Wt 215 lb (97.5 kg)   BMI 34.70 kg/m   Body mass index is 34.7 kg/m.  General appearance : Well developed well nourished female. No acute  distress HEENT: Eyes: no retinal hemorrhage or exudates,  Neck supple, trachea midline, no carotid bruits, no thyroidmegaly Lungs: Clear to auscultation, no rhonchi or wheezes, or rib retractions  Heart: Regular rate and rhythm, no murmurs or gallops Breast:Examined in sitting and supine position were symmetrical in appearance, no palpable masses or tenderness,  no skin retraction, no nipple inversion, no nipple discharge, no skin discoloration, no axillary or supraclavicular lymphadenopathy Abdomen: no palpable masses or tenderness, no rebound or guarding Extremities: no edema or skin discoloration or tenderness  Pelvic:  Bartholin, Urethra, Skene Glands: Within normal limits             Vagina: No gross lesions or discharge  Cervix: No gross lesions or discharge.  Pap reflex done.  Uterus  AV, normal size, shape and consistency, non-tender and mobile  Adnexa  Without masses or tenderness  Anus and perineum  normal    Assessment/Plan:  66 y.o. female for annual exam   1. Encounter for gynecological examination with abnormal finding Normal gyn exam except Atrophic Vaginitis, but asymptomatic as patient is abstinent.  Pap reflex done.  Breasts wnl.  Pending screening Mammo.  2. Menopause present No HRT.  No PMB.  Vit D supplement recommended.  Ca++ in nutrition.  Weight bearing physical activity.  Will f/u Bone Density at 2 years.  3. Class 1 obesity due to excess calories with serious comorbidity and body mass index (BMI) of 34.0 to 34.9 in adult Planning to be back on Bariatric diet.  Physical activity with weight lifting recommended.  4. Bariatric surgery status   BMI 34  5.  Yeast of skin At lower abdominal pannus.  Nystatin powder prescribed.    Counseling on above issues >50% x 15 minutes.   Princess Bruins MD, 2:15 PM 05/26/2017

## 2017-05-30 LAB — PAP IG W/ RFLX HPV ASCU

## 2017-06-02 DIAGNOSIS — E119 Type 2 diabetes mellitus without complications: Secondary | ICD-10-CM | POA: Diagnosis not present

## 2017-06-02 DIAGNOSIS — H2513 Age-related nuclear cataract, bilateral: Secondary | ICD-10-CM | POA: Diagnosis not present

## 2017-06-02 DIAGNOSIS — H25013 Cortical age-related cataract, bilateral: Secondary | ICD-10-CM | POA: Diagnosis not present

## 2017-08-11 DIAGNOSIS — I10 Essential (primary) hypertension: Secondary | ICD-10-CM | POA: Diagnosis not present

## 2017-08-11 DIAGNOSIS — K909 Intestinal malabsorption, unspecified: Secondary | ICD-10-CM | POA: Diagnosis not present

## 2017-09-22 DIAGNOSIS — J209 Acute bronchitis, unspecified: Secondary | ICD-10-CM | POA: Diagnosis not present

## 2017-09-22 DIAGNOSIS — I10 Essential (primary) hypertension: Secondary | ICD-10-CM | POA: Diagnosis not present

## 2017-09-22 DIAGNOSIS — E1142 Type 2 diabetes mellitus with diabetic polyneuropathy: Secondary | ICD-10-CM | POA: Diagnosis not present

## 2017-09-22 DIAGNOSIS — Z23 Encounter for immunization: Secondary | ICD-10-CM | POA: Diagnosis not present

## 2017-10-24 IMAGING — CR DG ABDOMEN 1V
2 series · 2 of 2 positions shown · non-contrast
Comparison: 04/15/2016 and CT pelvis 04/05/2016.

CLINICAL DATA: Pre lithotripsy on the right for right ureteral
stone.

EXAM:
ABDOMEN - 1 VIEW

[t abdomen supine (1 of 2)]
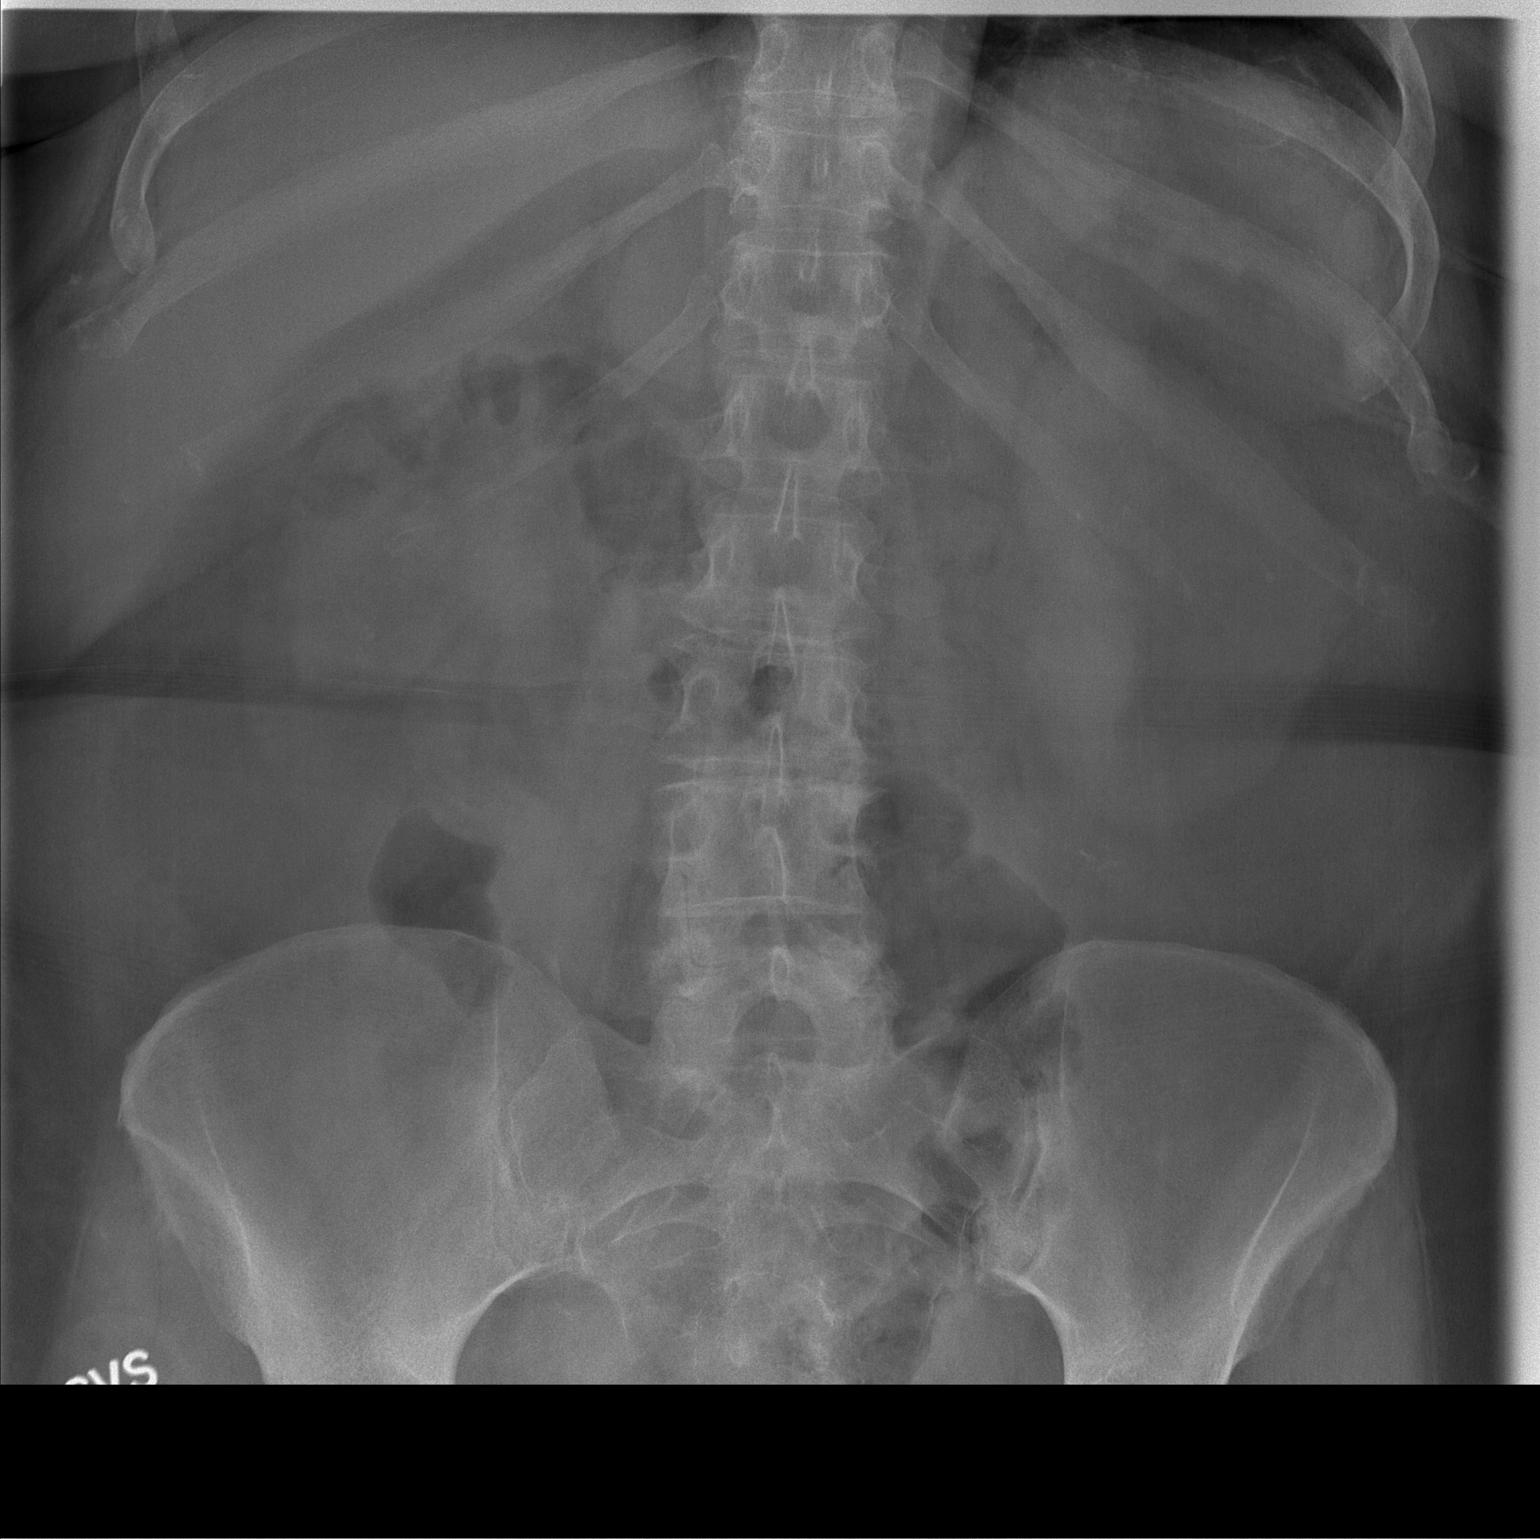

[t abdomen supine (2 of 2)]
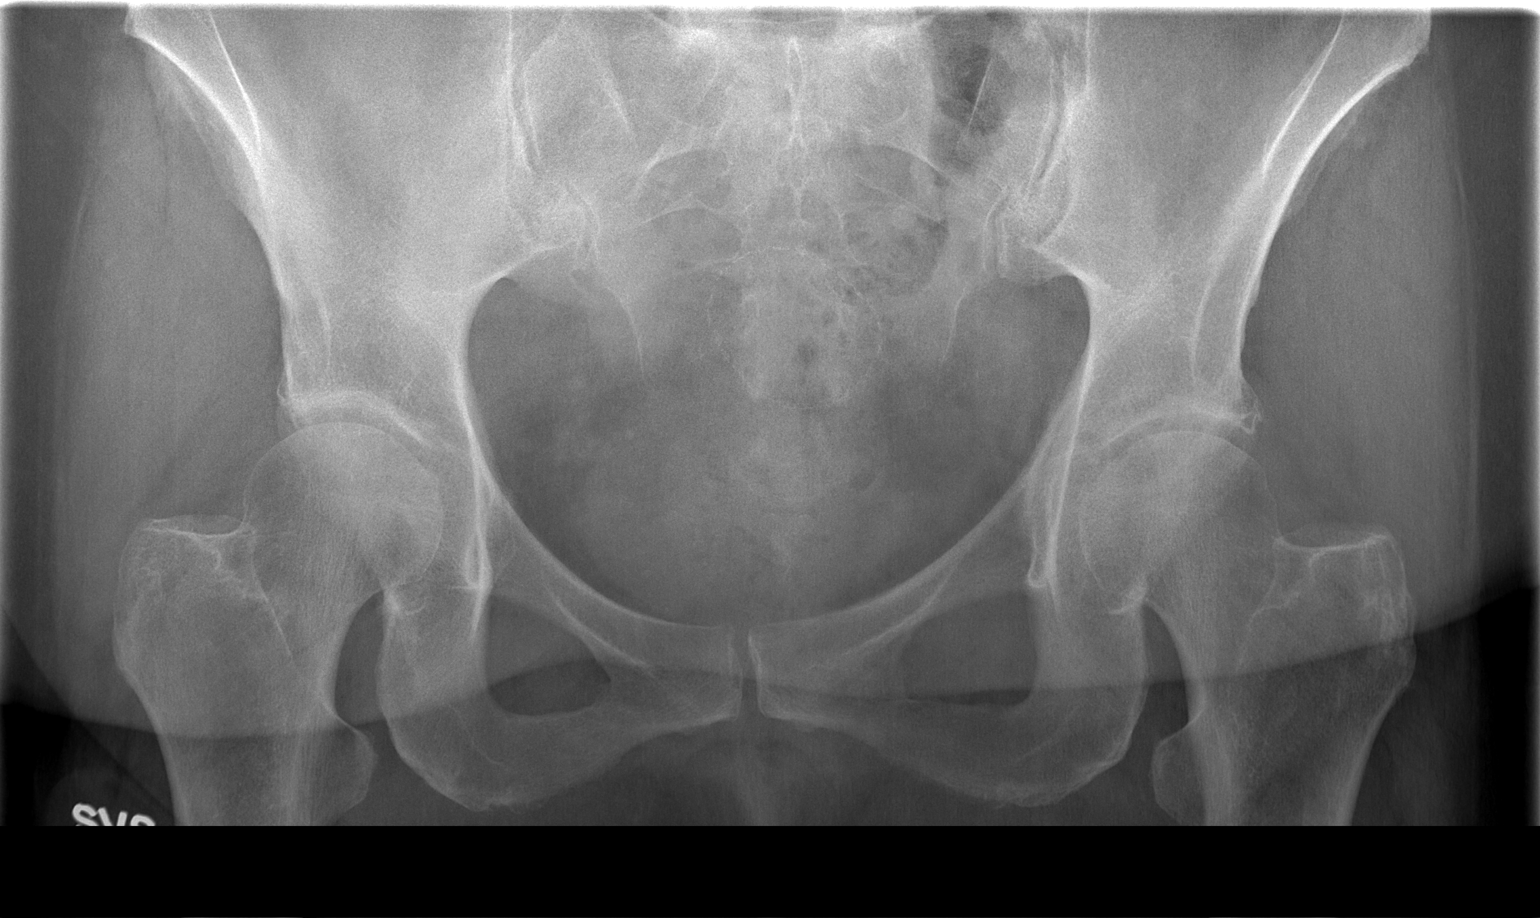

[2 of 2 positions shown; findings below may reference images not displayed]

FINDINGS: Tiny stones project over the renal outlines bilaterally. Previously
seen mid right ureteral stone is not as well visualized today.
IMPRESSION: 1. Previously seen mid right ureteral stone is less well visualized
on the current study.
2. Bilateral renal stones.

## 2017-11-03 ENCOUNTER — Ambulatory Visit: Payer: Self-pay | Admitting: *Deleted

## 2017-11-03 NOTE — Telephone Encounter (Signed)
Pt    Reports  Has  Had  A  persistant  Cough    For  About  7   Weeks .   Seen  By  [facility]     Dr   In  7  Weeks  And   Was  Given a  z  Pack  And  Tessalon  pearles .  Pt  Reports   She  Used  To  See  Dr  Jonni Sanger  At  [facility]  And   She  Signed  Papers  To  Be   Seen .   Spoke   With  Jennette   She  Will  Schedule  Pt  In a  30  Minute  Slot  For tommorow  At 330 pm   With Dr   Jonni Sanger      Reason for Disposition . Cough has been present for > 3 weeks  Answer Assessment - Initial Assessment Questions 1. ONSET: "When did the cough begin?"       approx   7    Weeks  Ago   2. SEVERITY: "How bad is the cough today?"        Worse  In  Am     Pt  Reports  Cough  Has  Been for  The  Most  Part  Constant   3. RESPIRATORY DISTRESS: "Describe your breathing."       No  resp distress   Wheezing  Some   Earlier  This  Am   4. FEVER: "Do you have a fever?" If so, ask: "What is your temperature, how was it measured, and when did it start?"       No   5. SPUTUM: "Describe the color of your sputum" (clear, white, yellow, green)       Sputom thick  At  Times   Mainly  In  Am    yellowisg  Green earlier thins  Out  During  The  Day   6. HEMOPTYSIS: "Are you coughing up any blood?" If so ask: "How much?" (flecks, streaks, tablespoons, etc.)     no 7. CARDIAC HISTORY: "Do you have any history of heart disease?" (e.g., heart attack, congestive heart failure)       no 8. LUNG HISTORY: "Do you have any history of lung disease?"  (e.g., pulmonary embolus, asthma, emphysema)     no 9. PE RISK FACTORS: "Do you have a history of blood clots?" (or: recent major surgery, recent prolonged travel, bedridden )      No 10. OTHER SYMPTOMS: "Do you have any other symptoms?" (e.g., runny nose, wheezing, chest pain)       Wheezing   In   Am   Congestion   stuffyness   11. PREGNANCY: "Is there any chance you are pregnant?" "When was your last menstrual period?"       No  chance 12. TRAVEL: "Have you  traveled out of the country in the last month?" (e.g., travel history, exposures)        NO  Protocols used: [place]

## 2017-11-04 ENCOUNTER — Ambulatory Visit (INDEPENDENT_AMBULATORY_CARE_PROVIDER_SITE_OTHER): Payer: Medicare Other | Admitting: Family Medicine

## 2017-11-04 ENCOUNTER — Other Ambulatory Visit: Payer: Self-pay | Admitting: Family Medicine

## 2017-11-04 ENCOUNTER — Encounter: Payer: Self-pay | Admitting: Family Medicine

## 2017-11-04 VITALS — BP 120/78 | HR 116 | Temp 98.6°F | Ht 66.0 in | Wt 223.0 lb

## 2017-11-04 DIAGNOSIS — IMO0002 Reserved for concepts with insufficient information to code with codable children: Secondary | ICD-10-CM

## 2017-11-04 DIAGNOSIS — J019 Acute sinusitis, unspecified: Secondary | ICD-10-CM | POA: Diagnosis not present

## 2017-11-04 DIAGNOSIS — E1140 Type 2 diabetes mellitus with diabetic neuropathy, unspecified: Secondary | ICD-10-CM | POA: Diagnosis not present

## 2017-11-04 DIAGNOSIS — E1165 Type 2 diabetes mellitus with hyperglycemia: Secondary | ICD-10-CM

## 2017-11-04 MED ORDER — FLUCONAZOLE 150 MG PO TABS: ORAL_TABLET | ORAL | 0 refills | Status: DC

## 2017-11-04 MED ORDER — AMOXICILLIN-POT CLAVULANATE 875-125 MG PO TABS: 1.0000 | ORAL_TABLET | Freq: Two times a day (BID) | ORAL | 0 refills | Status: AC

## 2017-11-04 MED ORDER — METFORMIN HCL 1000 MG PO TABS: 1000.0000 mg | ORAL_TABLET | Freq: Two times a day (BID) | ORAL | 3 refills | Status: DC

## 2017-11-04 MED ORDER — HYDROCOD POLST-CPM POLST ER 10-8 MG/5ML PO SUER: 5.0000 mL | Freq: Two times a day (BID) | ORAL | 0 refills | Status: DC | PRN

## 2017-11-04 MED ORDER — HYDROCODONE-HOMATROPINE 5-1.5 MG/5ML PO SYRP: 5.0000 mL | ORAL_SOLUTION | Freq: Three times a day (TID) | ORAL | 0 refills | Status: DC | PRN

## 2017-11-04 NOTE — Patient Instructions (Signed)
It was so good seeing you again! Thank you for establishing with my new practice and allowing me to continue caring for you. It means a lot to me.   Please schedule a follow up appointment with me in 3 months for diabetes follow up.  Medicare recommends an Annual Wellness Visit for all patients. Please schedule this to be done with our Nurse Educator, Kim. This is an informative "talk" visit; it's goals are to ensure that your health care needs are being met and to give you education regarding avoiding falls, ensuring you are not suffering from depression or problems with memory or thinking, and to educate you on Advance Care Planning. It helps me take good care of you!    

## 2017-11-04 NOTE — Progress Notes (Signed)
Hycodan on back order.  Changed to tussionex.

## 2017-11-04 NOTE — Progress Notes (Signed)
Subjective  CC:  Chief Complaint  Patient presents with  . Cough    x 7-8 weeks, expectorating green sputum, worse at night  . Facial Pain    Right side    HPI: SUBJECTIVE:  Marilyn Myers is a 66 y.o. female who complains of congestion, nasal blockage, post nasal drip, cough described as barky, dry, nocturnal, paroxysmal and and ongoing x 6-8 weeks,  and denies high fevers, SOB, chest pain or significant GI symptoms. Evaluated on 11/1 at [facility] and treated for bronchitis with Zpak and tessalon perles; no relief. Now with sinus pain and left sided dental pain. Thick purulent drainage is present. H/o sinus infections.  She denies a history of anorexia, dizziness, vomiting and wheezing. She denies a history of asthma or COPD. Patient does not smoke cigarettes.  She is a former [place] patient and is here to reestablish care with me today.  DM - a1c is elevated again off of metformin. Due for bariatric visit with labs in 2 weeks. I reviewed last visits.   I reviewed the patients updated PMH, FH, and SocHx.  Social History: Patient  reports that she quit smoking about 21 years ago. Her smoking use included cigarettes. She has a 25.00 pack-year smoking history. she has never used smokeless tobacco. She reports that she drinks alcohol. She reports that she does not use drugs.  Patient Active Problem List   Diagnosis Date Noted  . Delayed wound healing 03/09/2017  . Moderate protein-calorie malnutrition ([place]) 12/03/2016  . PVC's (premature ventricular contractions) 10/08/2016  . Diabetic peripheral neuropathy ([place]) 09/28/2016  . S/P colostomy takedown 07/02/2015  . Osteopenia 05/19/2015  . Essential hypertension 01/28/2015  . Type 2 diabetes mellitus, uncontrolled, with neuropathy ([place]) 01/28/2015  . Diverticulitis of large intestine 08/19/2014  . Hydradenitis 08/19/2014  . Nephrolithiasis 08/19/2014  . Obstructive sleep apnea 08/01/2014  . Controlled type 2 diabetes mellitus with diabetic  polyneuropathy, without long-term current use of insulin ([place]) 06/10/2014    Review of Systems: Cardiovascular: negative for chest pain Respiratory: negative for SOB or hemoptysis Gastrointestinal: negative for abdominal pain Genitourinary: negative for dysuria or gross hematuria Current Meds  Medication Sig  . Biotin 5000 MCG CAPS Take by mouth.  . Calcium Carbonate-Vitamin D (CALCIUM-VITAMIN D3 PO) Take by mouth.  . CARTIA XT 120 MG 24 hr capsule TAKE ONE CAPSULE BY MOUTH ONCE DAILY  . Cholecalciferol (VITAMIN D3) 1000 UNITS CAPS Take 1 capsule by mouth daily.  [name] Kitchen FIBER ADULT GUMMIES 2 g CHEW Chew by mouth.  . gabapentin (NEURONTIN) 600 MG tablet Take 600 mg by mouth 4 (four) times daily.  [name] Kitchen losartan-hydrochlorothiazide (HYZAAR) 100-25 MG tablet Take 1 tablet by mouth daily.  . Multiple Vitamin (MULTIVITAMIN) capsule Take by mouth.  . nystatin (MYCOSTATIN/NYSTOP) powder Apply topically 4 (four) times daily. (Patient taking differently: Apply topically as needed. )  . pantoprazole (PROTONIX) 20 MG tablet Take by mouth.  . Polyethylene Glycol 3350 (MIRALAX PO) Take by mouth as needed.  . [DISCONTINUED] HYDROcodone-acetaminophen (NORCO/VICODIN) 5-325 MG tablet Take by mouth.    Objective  Vitals: BP 120/78 (BP Location: Left Arm, Patient Position: Sitting, Cuff Size: Large)   Pulse (!) 116   Temp 98.6 F (37 C) (Oral)   Ht 5\' 6"  (1.676 m)   Wt 223 lb (101.2 kg)   SpO2 95%   BMI 35.99 kg/m  General: no acute distress but appears tired Psych:  Alert and oriented, normal mood and affect HEENT:  Normocephalic, atraumatic, supple  neck, moist mucous membranes, mildly erythematous pharynx without exudate, mild lymphadenopathy, + left sided sinus ttp, supple neck Cardiovascular:  RRR without murmur. no edema Respiratory:  Good breath sounds bilaterally, CTAB with normal respiratory effort with occasional rhonchi Skin:  Warm, no rashes Neurologic:   Mental status is normal. normal  gait  Assessment  1. Acute sinusitis treated with antibiotics in the past 60 days   2. Type 2 diabetes mellitus, uncontrolled, with neuropathy ([place])      Plan  Discussion:  Given patient's history and physical exam most likely diagnosis is a bacterial sinusitis: treat with 10d augmentin and cough meds. Diflucan given due to abx and diabetes.   DM - restart metformin and recheck 3 months. Needs to eat better! Will await labs from bariatric f/u.   Follow up: 3 months for DM and AWV as scheduled.   Commons side effects, risks, benefits, and alternatives for medications and treatment plan prescribed today were discussed, and the patient expressed understanding of the given instructions. Patient is instructed to call or message via MyChart if he/she has any questions or concerns regarding our treatment plan. No barriers to understanding were identified. We discussed Red Flag symptoms and signs in detail. Patient expressed understanding regarding what to do in case of urgent or emergency type symptoms.  Medication list was reconciled, printed and provided to the patient in AVS. Patient instructions and summary information was reviewed with the patient as documented in the AVS. This note was prepared with assistance of Dragon voice recognition software. Occasional wrong-word or sound-a-like substitutions may have occurred due to the inherent limitations of voice recognition software  No orders of the defined types were placed in this encounter.  Meds ordered this encounter  Medications  . metFORMIN (GLUCOPHAGE) 1000 MG tablet    Sig: Take 1 tablet (1,000 mg total) by mouth 2 (two) times daily with a meal.    Dispense:  180 tablet    Refill:  3  . amoxicillin-clavulanate (AUGMENTIN) 875-125 MG tablet    Sig: Take 1 tablet by mouth 2 (two) times daily for 10 days.    Dispense:  20 tablet    Refill:  0  . fluconazole (DIFLUCAN) 150 MG tablet    Sig: Take one tablet today; may repeat in 3 days if  symptoms persist    Dispense:  2 tablet    Refill:  0  . HYDROcodone-homatropine (HYCODAN) 5-1.5 MG/5ML syrup    Sig: Take 5 mLs by mouth every 8 (eight) hours as needed for cough.    Dispense:  120 mL    Refill:  0

## 2017-11-08 DIAGNOSIS — K631 Perforation of intestine (nontraumatic): Secondary | ICD-10-CM | POA: Diagnosis not present

## 2017-11-08 DIAGNOSIS — Z903 Acquired absence of stomach [part of]: Secondary | ICD-10-CM | POA: Diagnosis not present

## 2017-11-08 DIAGNOSIS — K912 Postsurgical malabsorption, not elsewhere classified: Secondary | ICD-10-CM | POA: Diagnosis not present

## 2017-11-30 NOTE — Progress Notes (Signed)
Subjective:   Marilyn Myers is a 67 y.o. female who presents for Medicare Annual (Subsequent) preventive examination.  Review of Systems:  No ROS.  Medicare Wellness Visit. Additional risk factors are reflected in the social history.  Cardiac Risk Factors include: advanced age (>33men, >36 women);diabetes mellitus;hypertension;obesity (BMI >30kg/m2)   Sleep patterns: Sleeps 6-8 hours.  Home Safety/Smoke Alarms: Feels safe in home. Smoke alarms in place.  Living environment; residence and Firearm Safety: Lives with husband in 3 story home.   Seat Belt Safety/Bike Helmet: Wears seat belt.   Female:   Pap-05/26/2017       Mammo-08/19/2017, normal.       Dexa scan-03/18/2015.  Pt will schedule with GYN.         CCS-Colonoscopy 08/22/2014, pt reports polyps. Recall 3 years. Plans to schedule in 3 months.      Objective:     Vitals: BP 132/70 (BP Location: Left Arm, Patient Position: Sitting, Cuff Size: Normal)   Pulse (!) 105   Ht 5\' 6"  (1.676 m)   Wt 222 lb 12.8 oz (101.1 kg)   SpO2 98%   BMI 35.96 kg/m   Body mass index is 35.96 kg/m.  Advanced Directives 12/01/2017 04/26/2016 07/26/2014 01/24/2012 10/08/2011 10/05/2011  Does Patient Have a Medical Advance Directive? Yes Yes No Patient does not have advance directive Patient does not have advance directive Patient does not have advance directive  Type of Advance Directive [place];Living will May;Living will - - - -  Does patient want to make changes to medical advance directive? - No - Patient declined - - - -  Copy of [place] in Chart? No - copy requested No - copy requested - - - -  Would patient like information on creating a medical advance directive? - - No - patient declined information - - -  Pre-existing out of facility DNR order (yellow form or pink MOST form) - - - No No -    Tobacco Social History   Tobacco Use  Smoking Status Former Smoker  .  Packs/day: 1.00  . Years: 25.00  . Pack years: 25.00  . Types: Cigarettes  . Last attempt to quit: 10/07/1996  . Years since quitting: 21.1  Smokeless Tobacco Never Used     Counseling given: Not Answered    Past Medical History:  Diagnosis Date  . Anxiety   . Diverticulitis of intestine without perforation or abscess   . Diverticulosis, sigmoid   . GERD (gastroesophageal reflux disease)   . History of diverticulitis of colon    09/ 2015  RESOLVED   . History of kidney stones   . Hypertension   . Nephrolithiasis    BILATERAL  . OSA on CPAP    wears cpap  . Right ureteral stone   . Type 2 diabetes mellitus ([place])    Past Surgical History:  Procedure Laterality Date  . BARIATRIC SURGERY    . CERVICAL CONE BIOPSY  1976  . COLON RESECTION     colostomy x 6 months  . CYSTO/  BILATERAL RETROGRADE PYELOGRAM/  PLACEMENT BILATERAL URETERAL STENTS  07-10-2011  . CYSTOSCOPY WITH RETROGRADE PYELOGRAM, URETEROSCOPY AND STENT PLACEMENT Right 10/11/2014   Procedure: CYSTOSCOPY WITH RETROGRADE PYELOGRAM, URETEROSCOPY, right ureteral biopsy, AND STENT PLACEMENT;  Surgeon: Arvil Persons, MD;  Location[facility];  Service: Urology;  Laterality: Right;  . CYSTOSCOPY/RETROGRADE/URETEROSCOPY  10/08/2011   Procedure: CYSTOSCOPY/RETROGRADE/URETEROSCOPY;  Surgeon: Hanley Ben,  MD;  Location: [facility];  Service: Urology;  Laterality: N/A;  CYSTOSCOPY, RIGHT  RETROGRADE, RIGHT URETEROSCOPY HOLMIUM LASER WITH BASKET STONE EXTRACTION AND RIGHT URETERAL STENT PLACEMENT  . EXTRACORPOREAL SHOCK WAVE LITHOTRIPSY  x3  last one 01-24-2012  . TUBAL LIGATION  1987  . URETEROSCPIC STONE EXTRACTION/ STENT PLACEMENT  left 08-20-2011   Family History  Problem Relation Age of Onset  . Breast cancer Mother        47  . Renal Disease Mother   . Diabetes Paternal Grandmother    Social History   Socioeconomic History  . Marital status: Married    Spouse name: None  .  Number of children: None  . Years of education: None  . Highest education level: None  Social Needs  . Financial resource strain: None  . Food insecurity - worry: None  . Food insecurity - inability: None  . Transportation needs - medical: None  . Transportation needs - non-medical: None  Occupational History  . None  Tobacco Use  . Smoking status: Former Smoker    Packs/day: 1.00    Years: 25.00    Pack years: 25.00    Types: Cigarettes    Last attempt to quit: 10/07/1996    Years since quitting: 21.1  . Smokeless tobacco: Never Used  Substance and Sexual Activity  . Alcohol use: Yes    Comment: rare  . Drug use: No  . Sexual activity: None  Other Topics Concern  . None  Social History Narrative  . None    Outpatient Encounter Medications as of 12/01/2017  Medication Sig  . Biotin 5000 MCG CAPS Take by mouth.  . Calcium Carbonate-Vitamin D (CALCIUM-VITAMIN D3 PO) Take by mouth.  . CARTIA XT 120 MG 24 hr capsule TAKE ONE CAPSULE BY MOUTH ONCE DAILY  . Cholecalciferol (VITAMIN D3) 1000 UNITS CAPS Take 1 capsule by mouth daily.  [name] Kitchen FIBER ADULT GUMMIES 2 g CHEW Chew by mouth.  . gabapentin (NEURONTIN) 600 MG tablet Take 600 mg by mouth 4 (four) times daily.  [name] Kitchen losartan-hydrochlorothiazide (HYZAAR) 100-25 MG tablet Take 1 tablet by mouth daily.  . metFORMIN (GLUCOPHAGE) 1000 MG tablet Take 1 tablet (1,000 mg total) by mouth 2 (two) times daily with a meal.  . Multiple Vitamin (MULTIVITAMIN) capsule Take by mouth.  . nystatin (MYCOSTATIN/NYSTOP) powder Apply topically 4 (four) times daily. (Patient taking differently: Apply topically as needed. )  . pantoprazole (PROTONIX) 20 MG tablet Take by mouth.  . chlorpheniramine-HYDROcodone (TUSSIONEX PENNKINETIC ER) 10-8 MG/5ML SUER Take 5 mLs by mouth every 12 (twelve) hours as needed for cough. (Patient not taking: Reported on 12/01/2017)  . fluconazole (DIFLUCAN) 150 MG tablet Take one tablet today; may repeat in 3 days if symptoms  persist (Patient not taking: Reported on 12/01/2017)  . Polyethylene Glycol 3350 (MIRALAX PO) Take by mouth as needed.   No facility-administered encounter medications on file as of 12/01/2017.     Activities of Daily Living In your present state of health, do you have any difficulty performing the following activities: 12/01/2017  Hearing? N  Vision? N  Difficulty concentrating or making decisions? N  Walking or climbing stairs? N  Dressing or bathing? N  Doing errands, shopping? N  Preparing Food and eating ? N  Using the Toilet? N  In the past six months, have you accidently leaked urine? N  Do you have problems with loss of bowel control? N  Managing your Medications? N  Managing your  Finances? N  Housekeeping or managing your Housekeeping? N  Some recent data might be hidden    Patient Care Team: [name], MD as PCP - General (Family Medicine) Dasher, Jeneen Rinks, MD (Surgery) Princess Bruins, MD as Consulting Physician (Obstetrics and Gynecology)    Assessment:   This is a routine wellness examination for [place].  Exercise Activities and Dietary recommendations Current Exercise Habits: Home exercise routine, Type of exercise: treadmill, Time (Minutes): 15, Frequency (Times/Week): 4, Weekly Exercise (Minutes/Week): 60, Exercise limited by: None identified   Diet (meal preparation, eat out, water intake, caffeinated beverages, dairy products, fruits and vegetables): Drinks water and protein shakes.   Breakfast: Bagels; cereal; egg/toast Lunch: sandwich Dinner: pasta; "eat out a lot"  Discussed bariatric diet and increasing activity.   Goals    . Weight (lb) < 200 lb (90.7 kg)     Begin "bariatric diet" again.        Depression Screen PHQ 2/9 Scores 12/01/2017  PHQ - 2 Score 0     Cognitive Function MMSE - Mini Mental State Exam 12/01/2017  Orientation to time 5  Orientation to Place 5  Registration 3  Attention/ Calculation 5  Recall 3  Language- name  2 objects 2  Language- repeat 1  Language- follow 3 step command 3  Language- read & follow direction 1  Write a sentence 1  Copy design 1  Total score 30        Immunization History  Administered Date(s) Administered  . Influenza, High Dose Seasonal PF 08/19/2014, 09/02/2015, 10/08/2016, 09/22/2017  . Pneumococcal Conjugate-13 10/18/2016  . Pneumococcal Polysaccharide-23 08/19/2014    Screening Tests Health Maintenance  Topic Date Due  . TETANUS/TDAP  12/01/2018 (Originally 05/01/1970)  . Hepatitis C Screening  12/01/2018 (Originally 1951/06/27)  . DEXA SCAN  03/17/2018  . HEMOGLOBIN A1C  03/22/2018  . OPHTHALMOLOGY EXAM  06/08/2018  . FOOT EXAM  09/22/2018  . MAMMOGRAM  08/20/2019  . COLONOSCOPY  08/22/2024  . INFLUENZA VACCINE  Completed  . PNA vac Low Risk Adult  Completed      Plan:     Schedule bone scan at GYN.   Bring a copy of your living will and/or healthcare power of attorney to your next office visit.  Continue doing brain stimulating activities (puzzles, reading, adult coloring books, staying active) to keep memory sharp.   I have personally reviewed and noted the following in the patient's chart:   . Medical and social history . Use of alcohol, tobacco or illicit drugs  . Current medications and supplements . Functional ability and status . Nutritional status . Physical activity . Advanced directives . List of other physicians . Hospitalizations, surgeries, and ER visits in previous 12 months . Vitals . Screenings to include cognitive, depression, and falls . Referrals and appointments  In addition, I have reviewed and discussed with patient certain preventive protocols, quality metrics, and best practice recommendations. A written personalized care plan for preventive services as well as general preventive health recommendations were provided to patient.     Gerilyn Nestle, RN  12/01/2017  PCP Notes: -Pt requesting refill on Nystatin  cream and Norco, phone note sent.  -Will schedule DEXA with GYN -F/U with PCP 01/24/2018

## 2017-12-01 ENCOUNTER — Telehealth: Payer: Self-pay

## 2017-12-01 ENCOUNTER — Other Ambulatory Visit: Payer: Self-pay

## 2017-12-01 ENCOUNTER — Ambulatory Visit (INDEPENDENT_AMBULATORY_CARE_PROVIDER_SITE_OTHER): Payer: Medicare Other

## 2017-12-01 ENCOUNTER — Ambulatory Visit: Payer: Medicare Other | Admitting: Family Medicine

## 2017-12-01 VITALS — BP 132/70 | HR 105 | Ht 66.0 in | Wt 222.8 lb

## 2017-12-01 DIAGNOSIS — Z0000 Encounter for general adult medical examination without abnormal findings: Secondary | ICD-10-CM

## 2017-12-01 MED ORDER — HYDROCODONE-ACETAMINOPHEN 5-325 MG PO TABS: 1.0000 | ORAL_TABLET | Freq: Every evening | ORAL | 0 refills | Status: DC | PRN

## 2017-12-01 MED ORDER — MICONAZOLE NITRATE 2 % EX CREA: 1.0000 "application " | TOPICAL_CREAM | Freq: Two times a day (BID) | CUTANEOUS | 0 refills | Status: DC

## 2017-12-01 NOTE — Patient Instructions (Addendum)
Schedule bone scan at GYN.   Bring a copy of your living will and/or healthcare power of attorney to your next office visit.  Continue doing brain stimulating activities (puzzles, reading, adult coloring books, staying active) to keep memory sharp.   Fall Prevention in the Home Falls can cause injuries. They can happen to people of all ages. There are many things you can do to make your home safe and to help prevent falls. What can I do on the outside of my home?  Regularly fix the edges of walkways and driveways and fix any cracks.  Remove anything that might make you trip as you walk through a door, such as a raised step or threshold.  Trim any bushes or trees on the path to your home.  Use bright outdoor lighting.  Clear any walking paths of anything that might make someone trip, such as rocks or tools.  Regularly check to see if handrails are loose or broken. Make sure that both sides of any steps have handrails.  Any raised decks and porches should have guardrails on the edges.  Have any leaves, snow, or ice cleared regularly.  Use sand or salt on walking paths during winter.  Clean up any spills in your garage right away. This includes oil or grease spills. What can I do in the bathroom?  Use night lights.  Install grab bars by the toilet and in the tub and shower. Do not use towel bars as grab bars.  Use non-skid mats or decals in the tub or shower.  If you need to sit down in the shower, use a plastic, non-slip stool.  Keep the floor dry. Clean up any water that spills on the floor as soon as it happens.  Remove soap buildup in the tub or shower regularly.  Attach bath mats securely with double-sided non-slip rug tape.  Do not have throw rugs and other things on the floor that can make you trip. What can I do in the bedroom?  Use night lights.  Make sure that you have a light by your bed that is easy to reach.  Do not use any sheets or blankets that are too  big for your bed. They should not hang down onto the floor.  Have a firm chair that has side arms. You can use this for support while you get dressed.  Do not have throw rugs and other things on the floor that can make you trip. What can I do in the kitchen?  Clean up any spills right away.  Avoid walking on wet floors.  Keep items that you use a lot in easy-to-reach places.  If you need to reach something above you, use a strong step stool that has a grab bar.  Keep electrical cords out of the way.  Do not use floor polish or wax that makes floors slippery. If you must use wax, use non-skid floor wax.  Do not have throw rugs and other things on the floor that can make you trip. What can I do with my stairs?  Do not leave any items on the stairs.  Make sure that there are handrails on both sides of the stairs and use them. Fix handrails that are broken or loose. Make sure that handrails are as long as the stairways.  Check any carpeting to make sure that it is firmly attached to the stairs. Fix any carpet that is loose or worn.  Avoid having throw rugs at the top  or bottom of the stairs. If you do have throw rugs, attach them to the floor with carpet tape.  Make sure that you have a light switch at the top of the stairs and the bottom of the stairs. If you do not have them, ask someone to add them for you. What else can I do to help prevent falls?  Wear shoes that: ? Do not have high heels. ? Have rubber bottoms. ? Are comfortable and fit you well. ? Are closed at the toe. Do not wear sandals.  If you use a stepladder: ? Make sure that it is fully opened. Do not climb a closed stepladder. ? Make sure that both sides of the stepladder are locked into place. ? Ask someone to hold it for you, if possible.  Clearly mark and make sure that you can see: ? Any grab bars or handrails. ? First and last steps. ? Where the edge of each step is.  Use tools that help you move  around (mobility aids) if they are needed. These include: ? Canes. ? Walkers. ? Scooters. ? Crutches.  Turn on the lights when you go into a dark area. Replace any light bulbs as soon as they burn out.  Set up your furniture so you have a clear path. Avoid moving your furniture around.  If any of your floors are uneven, fix them.  If there are any pets around you, be aware of where they are.  Review your medicines with your doctor. Some medicines can make you feel dizzy. This can increase your chance of falling. Ask your doctor what other things that you can do to help prevent falls. This information is not intended to replace advice given to you by your health care provider. Make sure you discuss any questions you have with your health care provider. Document Released: 09/04/2009 Document Revised: 04/15/2016 Document Reviewed: 12/13/2014 Elsevier Interactive Patient Education  2018 [place] Maintenance, Female Adopting a healthy lifestyle and getting preventive care can go a long way to promote health and wellness. Talk with your health care provider about what schedule of regular examinations is right for you. This is a good chance for you to check in with your provider about disease prevention and staying healthy. In between checkups, there are plenty of things you can do on your own. Experts have done a lot of research about which lifestyle changes and preventive measures are most likely to keep you healthy. Ask your health care provider for more information. Weight and diet Eat a healthy diet  Be sure to include plenty of vegetables, fruits, low-fat dairy products, and lean protein.  Do not eat a lot of foods high in solid fats, added sugars, or salt.  Get regular exercise. This is one of the most important things you can do for your health. ? Most adults should exercise for at least 150 minutes each week. The exercise should increase your heart rate and make you  sweat (moderate-intensity exercise). ? Most adults should also do strengthening exercises at least twice a week. This is in addition to the moderate-intensity exercise.  Maintain a healthy weight  Body mass index (BMI) is a measurement that can be used to identify possible weight problems. It estimates body fat based on height and weight. Your health care provider can help determine your BMI and help you achieve or maintain a healthy weight.  For females 88 years of age and older: ? A BMI below 18.5 is  considered underweight. ? A BMI of 18.5 to 24.9 is normal. ? A BMI of 25 to 29.9 is considered overweight. ? A BMI of 30 and above is considered obese.  Watch levels of cholesterol and blood lipids  You should start having your blood tested for lipids and cholesterol at 69 years of age, then have this test every 5 years.  You may need to have your cholesterol levels checked more often if: ? Your lipid or cholesterol levels are high. ? You are older than 69 years of age. ? You are at high risk for heart disease.  Cancer screening Lung Cancer  Lung cancer screening is recommended for adults 47-90 years old who are at high risk for lung cancer because of a history of smoking.  A yearly low-dose CT scan of the lungs is recommended for people who: ? Currently smoke. ? Have quit within the past 15 years. ? Have at least a 30-pack-year history of smoking. A pack year is smoking an average of one pack of cigarettes a day for 1 year.  Yearly screening should continue until it has been 15 years since you quit.  Yearly screening should stop if you develop a health problem that would prevent you from having lung cancer treatment.  Breast Cancer  Practice breast self-awareness. This means understanding how your breasts normally appear and feel.  It also means doing regular breast self-exams. Let your health care provider know about any changes, no matter how small.  If you are in your 20s  or 30s, you should have a clinical breast exam (CBE) by a health care provider every 1-3 years as part of a regular health exam.  If you are 56 or older, have a CBE every year. Also consider having a breast X-ray (mammogram) every year.  If you have a family history of breast cancer, talk to your health care provider about genetic screening.  If you are at high risk for breast cancer, talk to your health care provider about having an MRI and a mammogram every year.  Breast cancer gene (BRCA) assessment is recommended for women who have family members with BRCA-related cancers. BRCA-related cancers include: ? Breast. ? Ovarian. ? Tubal. ? Peritoneal cancers.  Results of the assessment will determine the need for genetic counseling and BRCA1 and BRCA2 testing.  Cervical Cancer Your health care provider may recommend that you be screened regularly for cancer of the pelvic organs (ovaries, uterus, and vagina). This screening involves a pelvic examination, including checking for microscopic changes to the surface of your cervix (Pap test). You may be encouraged to have this screening done every 3 years, beginning at age 62.  For women ages 36-65, health care providers may recommend pelvic exams and Pap testing every 3 years, or they may recommend the Pap and pelvic exam, combined with testing for human papilloma virus (HPV), every 5 years. Some types of HPV increase your risk of cervical cancer. Testing for HPV may also be done on women of any age with unclear Pap test results.  Other health care providers may not recommend any screening for nonpregnant women who are considered low risk for pelvic cancer and who do not have symptoms. Ask your health care provider if a screening pelvic exam is right for you.  If you have had past treatment for cervical cancer or a condition that could lead to cancer, you need Pap tests and screening for cancer for at least 20 years after your treatment. If Pap  tests  have been discontinued, your risk factors (such as having a new sexual partner) need to be reassessed to determine if screening should resume. Some women have medical problems that increase the chance of getting cervical cancer. In these cases, your health care provider may recommend more frequent screening and Pap tests.  Colorectal Cancer  This type of cancer can be detected and often prevented.  Routine colorectal cancer screening usually begins at 69 years of age and continues through 69 years of age.  Your health care provider may recommend screening at an earlier age if you have risk factors for colon cancer.  Your health care provider may also recommend using home test kits to check for hidden blood in the stool.  A small camera at the end of a tube can be used to examine your colon directly (sigmoidoscopy or colonoscopy). This is done to check for the earliest forms of colorectal cancer.  Routine screening usually begins at age 61.  Direct examination of the colon should be repeated every 5-10 years through 69 years of age. However, you may need to be screened more often if early forms of precancerous polyps or small growths are found.  Skin Cancer  Check your skin from head to toe regularly.  Tell your health care provider about any new moles or changes in moles, especially if there is a change in a mole's shape or color.  Also tell your health care provider if you have a mole that is larger than the size of a pencil eraser.  Always use sunscreen. Apply sunscreen liberally and repeatedly throughout the day.  Protect yourself by wearing long sleeves, pants, a wide-brimmed hat, and sunglasses whenever you are outside.  Heart disease, diabetes, and high blood pressure  High blood pressure causes heart disease and increases the risk of stroke. High blood pressure is more likely to develop in: ? People who have blood pressure in the high end of the normal range (130-139/85-89 mm  Hg). ? People who are overweight or obese. ? People who are African American.  If you are 20-44 years of age, have your blood pressure checked every 3-5 years. If you are 33 years of age or older, have your blood pressure checked every year. You should have your blood pressure measured twice-once when you are at a hospital or clinic, and once when you are not at a hospital or clinic. Record the average of the two measurements. To check your blood pressure when you are not at a hospital or clinic, you can use: ? An automated blood pressure machine at a pharmacy. ? A home blood pressure monitor.  If you are between 28 years and 59 years old, ask your health care provider if you should take aspirin to prevent strokes.  Have regular diabetes screenings. This involves taking a blood sample to check your fasting blood sugar level. ? If you are at a normal weight and have a low risk for diabetes, have this test once every three years after 69 years of age. ? If you are overweight and have a high risk for diabetes, consider being tested at a younger age or more often. Preventing infection Hepatitis B  If you have a higher risk for hepatitis B, you should be screened for this virus. You are considered at high risk for hepatitis B if: ? You were born in a country where hepatitis B is common. Ask your health care provider which countries are considered high risk. ? Your parents  were born in a high-risk country, and you have not been immunized against hepatitis B (hepatitis B vaccine). ? You have HIV or AIDS. ? You use needles to inject street drugs. ? You live with someone who has hepatitis B. ? You have had sex with someone who has hepatitis B. ? You get hemodialysis treatment. ? You take certain medicines for conditions, including cancer, organ transplantation, and autoimmune conditions.  Hepatitis C  Blood testing is recommended for: ? Everyone born from [birth year] through 1967. ? Anyone with known  risk factors for hepatitis C.  Sexually transmitted infections (STIs)  You should be screened for sexually transmitted infections (STIs) including gonorrhea and chlamydia if: ? You are sexually active and are younger than 69 years of age. ? You are older than 69 years of age and your health care provider tells you that you are at risk for this type of infection. ? Your sexual activity has changed since you were last screened and you are at an increased risk for chlamydia or gonorrhea. Ask your health care provider if you are at risk.  If you do not have HIV, but are at risk, it may be recommended that you take a prescription medicine daily to prevent HIV infection. This is called pre-exposure prophylaxis (PrEP). You are considered at risk if: ? You are sexually active and do not regularly use condoms or know the HIV status of your partner(s). ? You take drugs by injection. ? You are sexually active with a partner who has HIV.  Talk with your health care provider about whether you are at high risk of being infected with HIV. If you choose to begin PrEP, you should first be tested for HIV. You should then be tested every 3 months for as long as you are taking PrEP. Pregnancy  If you are premenopausal and you may become pregnant, ask your health care provider about preconception counseling.  If you may become pregnant, take 400 to 800 micrograms (mcg) of folic acid every day.  If you want to prevent pregnancy, talk to your health care provider about birth control (contraception). Osteoporosis and menopause  Osteoporosis is a disease in which the bones lose minerals and strength with aging. This can result in serious bone fractures. Your risk for osteoporosis can be identified using a bone density scan.  If you are 73 years of age or older, or if you are at risk for osteoporosis and fractures, ask your health care provider if you should be screened.  Ask your health care provider whether you  should take a calcium or vitamin D supplement to lower your risk for osteoporosis.  Menopause may have certain physical symptoms and risks.  Hormone replacement therapy may reduce some of these symptoms and risks. Talk to your health care provider about whether hormone replacement therapy is right for you. Follow these instructions at home:  Schedule regular health, dental, and eye exams.  Stay current with your immunizations.  Do not use any tobacco products including cigarettes, chewing tobacco, or electronic cigarettes.  If you are pregnant, do not drink alcohol.  If you are breastfeeding, limit how much and how often you drink alcohol.  Limit alcohol intake to no more than 1 drink per day for nonpregnant women. One drink equals 12 ounces of beer, 5 ounces of wine, or 1 ounces of hard liquor.  Do not use street drugs.  Do not share needles.  Ask your health care provider for help if you need support  or information about quitting drugs.  Tell your health care provider if you often feel depressed.  Tell your health care provider if you have ever been abused or do not feel safe at home. This information is not intended to replace advice given to you by your health care provider. Make sure you discuss any questions you have with your health care provider. Document Released: 05/24/2011 Document Revised: 04/15/2016 Document Reviewed: 08/12/2015 Elsevier Interactive Patient Education  [employer].

## 2017-12-01 NOTE — Progress Notes (Signed)
I have reviewed the documentation from the recent AWV done by Kim Broome; I agree with the documentation and will follow up on any recommendations or abnormal findings as suggested.  

## 2017-12-01 NOTE — Telephone Encounter (Signed)
Pt in for AWV, requesting refill for Nystatin cream. States cream works better than the powder. Also would like refill for Norco she uses prn at night for foot pain (prescribed at [facility] on 09/22/17). Pharmacy confirmed.

## 2017-12-01 NOTE — Telephone Encounter (Signed)
Reviewed medical record. Refilled meds as requested. norco #30 for rare intermittent use for neuropathy pain.  Monistat cream for intertrigo

## 2017-12-05 MED ORDER — MICONAZOLE NITRATE 2 % EX CREA: 1.0000 "application " | TOPICAL_CREAM | Freq: Two times a day (BID) | CUTANEOUS | 0 refills | Status: DC

## 2017-12-05 MED ORDER — HYDROCODONE-ACETAMINOPHEN 5-325 MG PO TABS: 1.0000 | ORAL_TABLET | Freq: Every evening | ORAL | 0 refills | Status: DC | PRN

## 2017-12-05 NOTE — Telephone Encounter (Signed)
Patient is requesting the 2 RX that were sent on 12/01/17, [facility] to [facility] purposes.

## 2017-12-05 NOTE — Telephone Encounter (Signed)
RX - norco and monistat [facility]  Please call the [facility] to cancel the prior refills sent there on 1/10. thanks

## 2017-12-05 NOTE — Telephone Encounter (Signed)
I have contacted the [facility] and they have canceled the original rx

## 2017-12-05 NOTE — Addendum Note (Signed)
Addended by: Billey Chang on: 12/05/2017 11:16 AM   Modules accepted: Orders

## 2017-12-19 ENCOUNTER — Telehealth: Payer: Self-pay | Admitting: Family Medicine

## 2017-12-19 MED ORDER — GABAPENTIN 600 MG PO TABS: 600.0000 mg | ORAL_TABLET | Freq: Four times a day (QID) | ORAL | 3 refills | Status: DC

## 2017-12-19 MED ORDER — LOSARTAN POTASSIUM-HCTZ 100-25 MG PO TABS: 1.0000 | ORAL_TABLET | Freq: Every day | ORAL | 3 refills | Status: DC

## 2017-12-19 MED ORDER — PANTOPRAZOLE SODIUM 20 MG PO TBEC: 20.0000 mg | DELAYED_RELEASE_TABLET | Freq: Every day | ORAL | 3 refills | Status: DC

## 2017-12-19 NOTE — Telephone Encounter (Signed)
May refill meds for 90 day supply with 3 refills. Please note she is on losartin-hctz (not plain losartin), thanks.

## 2017-12-19 NOTE — Telephone Encounter (Signed)
Rx Refilled and sent to Pharmacy

## 2017-12-19 NOTE — Telephone Encounter (Signed)
Copied from [place] [phone]. Topic: Quick Communication - See Telephone Encounter >> Dec 19, 2017 11:15 AM Boyd Kerbs wrote: CRM for notification. See Telephone encounter for:   [facility] [address]  [facility]  She is needing these called in: Losartan / HCTV  Cartia  Pantoprazole  Gabapentine   12/19/17.

## 2017-12-19 NOTE — Telephone Encounter (Signed)
Pt. Is requesting refills on Losartin (needs new prescription) , Cartia (last refill 04/05/16), Pantoprazole (last refill 09/22/17), and Gabapentin (needs new prescription).  She established care on 11/03/17 with Dr. Jonni Sanger at the [facility] office.  Note that she is [facility] [address] and [address]

## 2017-12-19 NOTE — Telephone Encounter (Signed)
Patient is requesting Prescriptions for Losartin, Pantoprazole and Gabapentin. Please Advise.

## 2017-12-20 ENCOUNTER — Telehealth: Payer: Self-pay | Admitting: Family Medicine

## 2017-12-20 MED ORDER — DILTIAZEM HCL ER COATED BEADS 120 MG PO CP24: 120.0000 mg | ORAL_CAPSULE | Freq: Every day | ORAL | 3 refills | Status: DC

## 2017-12-20 NOTE — Telephone Encounter (Signed)
Cartia refill Last OV: 10/25/17 Last Refill:04/05/16 by another provider [facility] [address] [place] [phone]

## 2017-12-20 NOTE — Telephone Encounter (Signed)
Copied from [place] [phone]. Topic: Quick Communication - Rx Refill/Question >> Dec 20, 2017 11:55 AM Corie Chiquito, NT wrote: Medication: Cartia  Has the patient contacted their pharmacy? No   Patient calling because she still needs her refill on her Cartia medication. I have informed patient that she needs to call her pharmacy first as well for all medication refills  Preferred Pharmacy (with phone number [facility] street [facility] [address] [phone]   Agent: Please be advised that RX refills may take up to 3 business days. We ask that you follow-up with your pharmacy.

## 2017-12-20 NOTE — Addendum Note (Signed)
Addended bySigurd Sos on: 12/20/2017 03:09 PM   Modules accepted: Orders

## 2017-12-23 DIAGNOSIS — K912 Postsurgical malabsorption, not elsewhere classified: Secondary | ICD-10-CM | POA: Diagnosis not present

## 2017-12-23 DIAGNOSIS — I10 Essential (primary) hypertension: Secondary | ICD-10-CM | POA: Diagnosis not present

## 2017-12-23 DIAGNOSIS — E1149 Type 2 diabetes mellitus with other diabetic neurological complication: Secondary | ICD-10-CM | POA: Diagnosis not present

## 2017-12-23 DIAGNOSIS — Z903 Acquired absence of stomach [part of]: Secondary | ICD-10-CM | POA: Diagnosis not present

## 2018-01-24 ENCOUNTER — Encounter: Payer: Self-pay | Admitting: Family Medicine

## 2018-01-24 ENCOUNTER — Ambulatory Visit (INDEPENDENT_AMBULATORY_CARE_PROVIDER_SITE_OTHER): Payer: Medicare Other | Admitting: Family Medicine

## 2018-01-24 ENCOUNTER — Other Ambulatory Visit: Payer: Self-pay

## 2018-01-24 VITALS — BP 104/70 | HR 85 | Temp 97.8°F | Resp 16 | Ht 66.0 in | Wt 220.4 lb

## 2018-01-24 DIAGNOSIS — J302 Other seasonal allergic rhinitis: Secondary | ICD-10-CM

## 2018-01-24 DIAGNOSIS — E1140 Type 2 diabetes mellitus with diabetic neuropathy, unspecified: Secondary | ICD-10-CM | POA: Diagnosis not present

## 2018-01-24 DIAGNOSIS — IMO0002 Reserved for concepts with insufficient information to code with codable children: Secondary | ICD-10-CM

## 2018-01-24 DIAGNOSIS — I10 Essential (primary) hypertension: Secondary | ICD-10-CM

## 2018-01-24 DIAGNOSIS — Z5320 Procedure and treatment not carried out because of patient's decision for unspecified reasons: Secondary | ICD-10-CM

## 2018-01-24 DIAGNOSIS — Z5329 Procedure and treatment not carried out because of patient's decision for other reasons: Secondary | ICD-10-CM | POA: Insufficient documentation

## 2018-01-24 DIAGNOSIS — E1165 Type 2 diabetes mellitus with hyperglycemia: Secondary | ICD-10-CM | POA: Diagnosis not present

## 2018-01-24 DIAGNOSIS — J301 Allergic rhinitis due to pollen: Secondary | ICD-10-CM | POA: Diagnosis not present

## 2018-01-24 HISTORY — DX: Other seasonal allergic rhinitis: J30.2

## 2018-01-24 LAB — POCT GLYCOSYLATED HEMOGLOBIN (HGB A1C): Hemoglobin A1C: 7

## 2018-01-24 MED ORDER — TRAMADOL HCL 50 MG PO TABS: 50.0000 mg | ORAL_TABLET | Freq: Four times a day (QID) | ORAL | 0 refills | Status: DC | PRN

## 2018-01-24 MED ORDER — ZOLPIDEM TARTRATE 5 MG PO TABS: 5.0000 mg | ORAL_TABLET | Freq: Every evening | ORAL | 2 refills | Status: DC | PRN

## 2018-01-24 MED ORDER — CETIRIZINE HCL 10 MG PO TABS: 10.0000 mg | ORAL_TABLET | Freq: Every day | ORAL | 11 refills | Status: DC

## 2018-01-24 MED ORDER — FLUTICASONE PROPIONATE 50 MCG/ACT NA SUSP: 2.0000 | Freq: Every day | NASAL | 6 refills | Status: DC

## 2018-01-24 NOTE — Progress Notes (Signed)
Subjective  CC:  Chief Complaint  Patient presents with  . Follow-up    Diabetes, has not checked her BS readings    HPI: Marilyn Myers is a 67 y.o. female who presents to the office today for follow up of diabetes and problems listed above in the chief complaint.   Diabetes follow up: Her diabetic control is reported as Improved.  For the last 3 months she is back on her metformin twice a day, tolerating it well.  She does not check her sugars.  She has not had symptoms of hypoglycemia. She denies exertional CP or SOB or symptomatic hypoglycemia. She denies foot sores.  Her painful paresthesias persist even on gabapentin.  Using intermittent hydrocodone.  At times, her pain keeps her awake.  She did use some leftover Ambien and this helped considerably.  She is hoping to get a refill.  She has never used tramadol.  Obesity: Had follow-up with bariatrics a few weeks ago.  I reviewed that note.  She is stable.  She is taking her vitamin supplements and working on maintaining a high protein diet.  Hyperlipidemia but refuses statins due to perceived risks.  LDL was just above goal at 109.  Cough: Patient reports awakening daily with phlegm in her throat and intermittent coughing.  She has nasal congestion.  She denies sneezing or eye symptoms.  She does not take any medications for this.  Her recent bronchitis and sinus infection did resolve.  Immunization History  Administered Date(s) Administered  . Influenza, High Dose Seasonal PF 08/19/2014, 09/02/2015, 10/08/2016, 09/22/2017  . Pneumococcal Conjugate-13 10/18/2016  . Pneumococcal Polysaccharide-23 08/19/2014    Diabetes Related Lab Review: Lab Results  Component Value Date   HGBA1C 7.0 01/24/2018   No results found for: Derl Barrow Lab Results  Component Value Date   CREATININE 0.89 07/26/2014   BUN 20 07/26/2014   NA 141 10/11/2014   K 4.0 10/11/2014   CL 98 07/26/2014   CO2 21 07/26/2014   No results found  for: CHOL No results found for: HDL No results found for: LDLCALC No results found for: TRIG No results found for: CHOLHDL No results found for: LDLDIRECT The ASCVD Risk score Mikey Bussing DC Jr., et al., 2013) failed to calculate for the following reasons:   Cannot find a previous HDL lab I have reviewed the [place], Fam and Soc history. Patient Active Problem List   Diagnosis Date Noted  . Type 2 diabetes mellitus, uncontrolled, with neuropathy ([place]) 01/28/2015    Priority: High  . Refusal of statin medication by patient 01/24/2018  . Seasonal allergic rhinitis 01/24/2018  . Delayed wound healing 03/09/2017  . Moderate protein-calorie malnutrition ([place]) 12/03/2016  . PVC's (premature ventricular contractions) 10/08/2016  . Diabetic peripheral neuropathy ([place]) 09/28/2016  . S/P colostomy takedown 07/02/2015  . Osteopenia 05/19/2015  . Essential hypertension 01/28/2015  . Diverticulitis of large intestine 08/19/2014  . Hydradenitis 08/19/2014  . Nephrolithiasis 08/19/2014  . Obstructive sleep apnea 08/01/2014  . Controlled type 2 diabetes mellitus with diabetic polyneuropathy, without long-term current use of insulin ([place]) 06/10/2014    Social History: Patient  reports that she quit smoking about 21 years ago. Her smoking use included cigarettes. She has a 25.00 pack-year smoking history. she has never used smokeless tobacco. She reports that she drinks alcohol. She reports that she does not use drugs.  Review of Systems: Ophthalmic: negative for eye pain, loss of vision or double vision Cardiovascular: negative for chest pain Respiratory:  negative for SOB or persistent cough Gastrointestinal: negative for abdominal pain Genitourinary: negative for dysuria or gross hematuria MSK: negative for foot lesions Neurologic: negative for weakness or gait disturbance  Objective  Vitals: BP 104/70   Pulse 85   Temp 97.8 F (36.6 C) (Oral)   Resp 16   Ht 5\' 6"  (1.676 m)   Wt 220 lb 6.4 oz  (100 kg)   SpO2 97%   BMI 35.57 kg/m  General: well appearing, no acute distress  Psych:  Alert and oriented, normal mood and affect HEENT:  Normocephalic, atraumatic, moist mucous membranes, supple neck  Cardiovascular:  Nl S1 and S2, RRR without murmur, gallop or rub. no edema Respiratory:  Good breath sounds bilaterally, CTAB with normal effort, no rales Gastrointestinal: normal BS, soft, nontender Skin:  Warm, no rashes  Assessment  1. Type 2 diabetes mellitus, uncontrolled, with neuropathy ([place])   2. Essential hypertension   3. Refusal of statin medication by patient   4. Seasonal allergic rhinitis due to pollen      Plan   Diabetes is currently well controlled.  Will continue current meds at current dosages. Continue healthy diet and weight loss. Recheck 3 months.   Peripheral neuropathy on gabapentin; painful and interferes with sleep. Had used norco intermittently but hasn't tried ultram. Asking for sleep meds as well. Used ambien in past with success.   Allergies: start flonase and zyrtec. Zyrtec may be helpful for sleep.   Hyperlipidemia, mixed; refuses statin. Consider zetia.  Diabetic education: ongoing education regarding chronic disease management for diabetes was given today. We continue to reinforce the ABC's of diabetic management: A1c (<7 or 8 dependent upon patient), tight blood pressure control, and cholesterol management with goal LDL < 100 minimally. We discuss diet strategies, exercise recommendations, medication options and possible side effects. At each visit, we review recommended immunizations and preventive care recommendations for diabetics and stress that good diabetic control can prevent other problems. See below for this patient's data.  Follow up: Return in about 3 months (around 04/26/2018) for follow up of diabetes and hypertension..   Commons side effects, risks, benefits, and alternatives for medications and treatment plan prescribed today were  discussed, and the patient expressed understanding of the given instructions. Patient is instructed to call or message via MyChart if he/she has any questions or concerns regarding our treatment plan. No barriers to understanding were identified. We discussed Red Flag symptoms and signs in detail. Patient expressed understanding regarding what to do in case of urgent or emergency type symptoms.   Medication list was reconciled, printed and provided to the patient in AVS. Patient instructions and summary information was reviewed with the patient as documented in the AVS. This note was prepared with assistance of Dragon voice recognition software. Occasional wrong-word or sound-a-like substitutions may have occurred due to the inherent limitations of voice recognition software  Orders Placed This Encounter  Procedures  . POCT glycosylated hemoglobin (Hb A1C)   Meds ordered this encounter  Medications  . fluticasone (FLONASE) 50 MCG/ACT nasal spray    Sig: Place 2 sprays into both nostrils daily.    Dispense:  16 g    Refill:  6  . cetirizine (ZYRTEC) 10 MG tablet    Sig: Take 1 tablet (10 mg total) by mouth daily.    Dispense:  30 tablet    Refill:  11  . zolpidem (AMBIEN) 5 MG tablet    Sig: Take 1 tablet (5 mg total) by mouth  at bedtime as needed for sleep.    Dispense:  30 tablet    Refill:  2  . traMADol (ULTRAM) 50 MG tablet    Sig: Take 1 tablet (50 mg total) by mouth every 6 (six) hours as needed for moderate pain.    Dispense:  30 tablet    Refill:  0

## 2018-01-24 NOTE — Patient Instructions (Addendum)
Please return  in 3 months for diabetes check  If you have any questions or concerns, please don't hesitate to send me a message via MyChart or call the office at [phone]. Thank you for visiting with [place] today! It's our pleasure caring for you.  Take zyrtec at night for allergies and sleep.   Take tramadol to see if this will control your neuropathy pain.   You can use the ambien IF needed.   Let me know if you feel lightheaded; we may be able to decrease your blood pressure.

## 2018-03-13 ENCOUNTER — Other Ambulatory Visit: Payer: Self-pay | Admitting: Family Medicine

## 2018-04-13 ENCOUNTER — Other Ambulatory Visit: Payer: Self-pay | Admitting: Family Medicine

## 2018-04-13 NOTE — Telephone Encounter (Signed)
Last OV: 01/24/2018 Fill Date: 03/14/2018

## 2018-04-25 ENCOUNTER — Other Ambulatory Visit: Payer: Self-pay

## 2018-04-25 ENCOUNTER — Encounter: Payer: Self-pay | Admitting: Family Medicine

## 2018-04-25 ENCOUNTER — Ambulatory Visit (INDEPENDENT_AMBULATORY_CARE_PROVIDER_SITE_OTHER): Payer: Medicare Other | Admitting: Family Medicine

## 2018-04-25 VITALS — BP 112/74 | HR 82 | Temp 97.8°F | Resp 15 | Ht 66.0 in | Wt 216.6 lb

## 2018-04-25 DIAGNOSIS — M8580 Other specified disorders of bone density and structure, unspecified site: Secondary | ICD-10-CM

## 2018-04-25 DIAGNOSIS — Z5320 Procedure and treatment not carried out because of patient's decision for unspecified reasons: Secondary | ICD-10-CM

## 2018-04-25 DIAGNOSIS — E1165 Type 2 diabetes mellitus with hyperglycemia: Secondary | ICD-10-CM | POA: Diagnosis not present

## 2018-04-25 DIAGNOSIS — E1140 Type 2 diabetes mellitus with diabetic neuropathy, unspecified: Secondary | ICD-10-CM | POA: Diagnosis not present

## 2018-04-25 DIAGNOSIS — Z5329 Procedure and treatment not carried out because of patient's decision for other reasons: Secondary | ICD-10-CM | POA: Diagnosis not present

## 2018-04-25 DIAGNOSIS — IMO0002 Reserved for concepts with insufficient information to code with codable children: Secondary | ICD-10-CM

## 2018-04-25 DIAGNOSIS — E1142 Type 2 diabetes mellitus with diabetic polyneuropathy: Secondary | ICD-10-CM

## 2018-04-25 DIAGNOSIS — I10 Essential (primary) hypertension: Secondary | ICD-10-CM | POA: Diagnosis not present

## 2018-04-25 LAB — POCT GLYCOSYLATED HEMOGLOBIN (HGB A1C): Hemoglobin A1C: 6.3 % — AB (ref 4.0–5.6)

## 2018-04-25 MED ORDER — TRAMADOL HCL 50 MG PO TABS: 50.0000 mg | ORAL_TABLET | Freq: Every evening | ORAL | 3 refills | Status: DC | PRN

## 2018-04-25 MED ORDER — HYDROCODONE-ACETAMINOPHEN 5-325 MG PO TABS: 1.0000 | ORAL_TABLET | Freq: Every evening | ORAL | 0 refills | Status: DC | PRN

## 2018-04-25 MED ORDER — ZOLPIDEM TARTRATE 5 MG PO TABS: 5.0000 mg | ORAL_TABLET | Freq: Every evening | ORAL | 3 refills | Status: DC | PRN

## 2018-04-25 NOTE — Patient Instructions (Addendum)
Please schedule a follow up appointment with me in 6 months for Diabetes, Blood Pressure & Cholesterol.  Please come fasting.   Please schedule your Mammogram and Dexa with Dr. Dellis Filbert  Your diabetes is now well controlled.  Keep up the good work!!

## 2018-04-25 NOTE — Progress Notes (Signed)
Subjective    CC:  Chief Complaint  Patient presents with  . Diabetes    Doing well    HPI: Marilyn Myers is a 67 y.o. female who presents to the office today for follow up of diabetes and problems listed above in the chief complaint. Also f/u neuropathy, sleep, HTN and hm due items.  Diabetes follow up: Her diabetic control is reported as Improved. Has been trying intermittent fasting for last 3 weeks and loves it. Losing weight and feels better. Tolerating metformin. No sxs of hyperglycemia. Has peripheral neuropathy on gabapentin but that wasn't controlling her sxs: now with tramadol nightly and ambien for sleep: "feels like a new person!". Needs refills. Rarely requires norco due to severe pain in feet.  She denies exertional CP or SOB or symptomatic hypoglycemia. She denies foot sores.  HTN: doing fine on meds w/o sxs of CAD or chf  HLD: refuses statin. Discuss trial of other lipid lowering meds: pt prefers to work harder on diet and weight loss at this time.   HM: due mammo and dexa: will get at [facility] office. Has upcoming female wellness visit this summer.   Assessment  1. Type 2 diabetes mellitus, uncontrolled, with neuropathy ([place])   2. Essential hypertension   3. Diabetic peripheral neuropathy ([place])   4. Osteopenia, unspecified location   5. Refusal of statin medication by patient      Plan   Diabetes is currently very well controlled. Doing well again. Continue meds. imms up to date. Eye exam up to date. On ARB.   HTN: controlled. On ARB. Nl renal function.   HLD: TLC and weight loss; recheck in 6 months. Consider zetia at that time if LDL > 80.   Neuropathy and secondary insomnia now well controlled with neurontin, ultram and ambien.   HM: await mammogram and dexa results. F/u osteopenia.   Follow up: Return in about 6 months (around 10/25/2018) for follow up of diabetes and hypertension, fasting labwork.. Orders Placed This Encounter  Procedures  . POCT  glycosylated hemoglobin (Hb A1C)   Meds ordered this encounter  Medications  . HYDROcodone-acetaminophen (NORCO/VICODIN) 5-325 MG tablet    Sig: Take 1 tablet by mouth at bedtime as needed for moderate pain.    Dispense:  20 tablet    Refill:  0  . zolpidem (AMBIEN) 5 MG tablet    Sig: Take 1 tablet (5 mg total) by mouth at bedtime as needed for sleep.    Dispense:  90 tablet    Refill:  3  . traMADol (ULTRAM) 50 MG tablet    Sig: Take 1-2 tablets (50-100 mg total) by mouth at bedtime as needed.    Dispense:  90 tablet    Refill:  3    Not to exceed 5 additional fills before 09/10/2018      Immunization History  Administered Date(s) Administered  . Influenza, High Dose Seasonal PF 08/19/2014, 09/02/2015, 10/08/2016, 09/22/2017  . Pneumococcal Conjugate-13 10/18/2016  . Pneumococcal Polysaccharide-23 08/19/2014    Diabetes Related Lab Review: Lab Results  Component Value Date   HGBA1C 6.3 (A) 04/25/2018   HGBA1C 7.0 01/24/2018    No results found for: Derl Barrow Lab Results  Component Value Date   CREATININE 0.89 07/26/2014   BUN 20 07/26/2014   NA 141 10/11/2014   K 4.0 10/11/2014   CL 98 07/26/2014   CO2 21 07/26/2014   LDL 109 09/2017  I have reviewed the [place], Fam and Soc  history. Patient Active Problem List   Diagnosis Date Noted  . Type 2 diabetes mellitus, uncontrolled, with neuropathy ([place]) 01/28/2015    Priority: High  . Refusal of statin medication by patient 01/24/2018    Husband has problems with this medication for her   . Seasonal allergic rhinitis 01/24/2018  . Moderate protein-calorie malnutrition ([place]) 12/03/2016  . PVC's (premature ventricular contractions) 10/08/2016  . Diabetic peripheral neuropathy ([place]) 09/28/2016    Overview:  Added automatically from request for surgery [id number]   . S/P colostomy takedown 07/02/2015  . Osteopenia 05/19/2015    Overview:  T = -1.4 Dexa 2016.   [name] Kitchen Essential hypertension 01/28/2015  .  Diverticulitis of large intestine 08/19/2014    Overview:  2015   . Hydradenitis 08/19/2014  . Nephrolithiasis 08/19/2014    Overview:  Dr. Kellie Simmering   . Obstructive sleep apnea 08/01/2014  . Controlled type 2 diabetes mellitus with diabetic polyneuropathy, without long-term current use of insulin ([place]) 06/10/2014    Social History: Patient  reports that she quit smoking about 21 years ago. Her smoking use included cigarettes. She has a 25.00 pack-year smoking history. She has never used smokeless tobacco. She reports that she drinks alcohol. She reports that she does not use drugs.  Review of Systems: Ophthalmic: negative for eye pain, loss of vision or double vision Cardiovascular: negative for chest pain Respiratory: negative for SOB or persistent cough Gastrointestinal: negative for abdominal pain Genitourinary: negative for dysuria or gross hematuria MSK: negative for foot lesions Neurologic: negative for weakness or gait disturbance  Objective  Vitals: BP 112/74   Pulse 82   Temp 97.8 F (36.6 C) (Oral)   Resp 15   Ht 5\' 6"  (1.676 m)   Wt 216 lb 9.6 oz (98.2 kg)   SpO2 98%   BMI 34.96 kg/m  General: well appearing, no acute distress  Psych:  Alert and oriented, normal mood and affect HEENT:  Normocephalic, atraumatic, moist mucous membranes, supple neck  Cardiovascular:  Nl S1 and S2, RRR without murmur, gallop or rub. no edema Respiratory:  Good breath sounds bilaterally, CTAB with normal effort, no rales, rare wheeze at right base Gastrointestinal: normal BS, soft, nontender Skin:  Warm, no rashes Neurologic:   Mental status is normal. normal gait Foot exam: no erythema, pallor, or cyanosis visible nl proprioception and sensation to monofilament testing bilaterally, +2 distal pulses bilaterally    Diabetic education: ongoing education regarding chronic disease management for diabetes was given today. We continue to reinforce the ABC's of diabetic management: A1c  (<7 or 8 dependent upon patient), tight blood pressure control, and cholesterol management with goal LDL < 100 minimally. We discuss diet strategies, exercise recommendations, medication options and possible side effects. At each visit, we review recommended immunizations and preventive care recommendations for diabetics and stress that good diabetic control can prevent other problems. See below for this patient's data.    Commons side effects, risks, benefits, and alternatives for medications and treatment plan prescribed today were discussed, and the patient expressed understanding of the given instructions. Patient is instructed to call or message via MyChart if he/she has any questions or concerns regarding our treatment plan. No barriers to understanding were identified. We discussed Red Flag symptoms and signs in detail. Patient expressed understanding regarding what to do in case of urgent or emergency type symptoms.   Medication list was reconciled, printed and provided to the patient in AVS. Patient instructions and summary information was reviewed with the patient  as documented in the AVS. This note was prepared with assistance of Dragon voice recognition software. Occasional wrong-word or sound-a-like substitutions may have occurred due to the inherent limitations of voice recognition software

## 2018-05-15 ENCOUNTER — Other Ambulatory Visit: Payer: Self-pay | Admitting: Family Medicine

## 2018-05-30 DIAGNOSIS — Z1231 Encounter for screening mammogram for malignant neoplasm of breast: Secondary | ICD-10-CM | POA: Diagnosis not present

## 2018-06-06 DIAGNOSIS — H25013 Cortical age-related cataract, bilateral: Secondary | ICD-10-CM | POA: Diagnosis not present

## 2018-06-06 DIAGNOSIS — H2513 Age-related nuclear cataract, bilateral: Secondary | ICD-10-CM | POA: Diagnosis not present

## 2018-06-06 DIAGNOSIS — H43813 Vitreous degeneration, bilateral: Secondary | ICD-10-CM | POA: Diagnosis not present

## 2018-06-06 DIAGNOSIS — E119 Type 2 diabetes mellitus without complications: Secondary | ICD-10-CM | POA: Diagnosis not present

## 2018-06-20 ENCOUNTER — Other Ambulatory Visit: Payer: Self-pay | Admitting: Gynecology

## 2018-06-20 ENCOUNTER — Other Ambulatory Visit: Payer: Self-pay | Admitting: Obstetrics & Gynecology

## 2018-06-20 DIAGNOSIS — Z780 Asymptomatic menopausal state: Secondary | ICD-10-CM

## 2018-06-28 DIAGNOSIS — K5730 Diverticulosis of large intestine without perforation or abscess without bleeding: Secondary | ICD-10-CM | POA: Diagnosis not present

## 2018-06-28 DIAGNOSIS — Z86010 Personal history of colonic polyps: Secondary | ICD-10-CM | POA: Diagnosis not present

## 2018-06-28 DIAGNOSIS — D123 Benign neoplasm of transverse colon: Secondary | ICD-10-CM | POA: Diagnosis not present

## 2018-06-28 LAB — HM COLONOSCOPY

## 2018-07-04 ENCOUNTER — Ambulatory Visit (INDEPENDENT_AMBULATORY_CARE_PROVIDER_SITE_OTHER): Payer: Medicare Other

## 2018-07-04 DIAGNOSIS — Z780 Asymptomatic menopausal state: Secondary | ICD-10-CM | POA: Diagnosis not present

## 2018-07-10 ENCOUNTER — Encounter: Payer: Self-pay | Admitting: Gynecology

## 2018-07-13 DIAGNOSIS — L814 Other melanin hyperpigmentation: Secondary | ICD-10-CM | POA: Diagnosis not present

## 2018-07-13 DIAGNOSIS — D3611 Benign neoplasm of peripheral nerves and autonomic nervous system of face, head, and neck: Secondary | ICD-10-CM | POA: Diagnosis not present

## 2018-07-13 DIAGNOSIS — D485 Neoplasm of uncertain behavior of skin: Secondary | ICD-10-CM | POA: Diagnosis not present

## 2018-07-13 DIAGNOSIS — L720 Epidermal cyst: Secondary | ICD-10-CM | POA: Diagnosis not present

## 2018-07-13 DIAGNOSIS — D170 Benign lipomatous neoplasm of skin and subcutaneous tissue of head, face and neck: Secondary | ICD-10-CM | POA: Diagnosis not present

## 2018-07-13 DIAGNOSIS — L821 Other seborrheic keratosis: Secondary | ICD-10-CM | POA: Diagnosis not present

## 2018-07-21 ENCOUNTER — Telehealth: Payer: Self-pay | Admitting: Family Medicine

## 2018-07-21 NOTE — Telephone Encounter (Signed)
Please call pt to clarify: is she wanting hydrocodone refill or tramadol? Would prefer use of tramadol for neuropathy pain.   Last RX for hydrocodone was for 20 prescribed on June 4th - for rare use ... Please clarify how she is using the tramadol and hydrocodone.   For sea sickness, there are medications but most have the side effect of drowsiness. Has she used the patch before?

## 2018-07-21 NOTE — Telephone Encounter (Signed)
Last OV:  04/25/2018 Last Fill:  04/25/2018  Please advise.   Doloris Hall,  LPN

## 2018-07-21 NOTE — Telephone Encounter (Signed)
Patient states that she is taking Tramadol about an hour before her Ambien, then she takes the Hydrocodone, every one in a while to help with foot pain, currently she has no refills she is also taking up to 4 Gabapentin daily. Patient states that she has never taken anything for sea sickness.   Please advise.

## 2018-07-21 NOTE — Telephone Encounter (Signed)
Copied from [place] [id number]. Topic: General - Other >> Jul 21, 2018 10:59 AM Lennox Solders wrote: Reason for CRM: pt is calling and needs new rx hydrocodone for her foot pain and pt will be going on 7 day cruises starting 07-29-18. Pt would like something for sea sickness that will not make her sleepy .  [facility] [address]

## 2018-07-25 MED ORDER — SCOPOLAMINE 1 MG/3DAYS TD PT72
1.0000 | MEDICATED_PATCH | TRANSDERMAL | 0 refills | Status: DC
Start: 2018-07-25 — End: 2018-08-29

## 2018-07-25 MED ORDER — HYDROCODONE-ACETAMINOPHEN 5-325 MG PO TABS: 1.0000 | ORAL_TABLET | Freq: Every evening | ORAL | 0 refills | Status: DC | PRN

## 2018-07-28 ENCOUNTER — Ambulatory Visit (INDEPENDENT_AMBULATORY_CARE_PROVIDER_SITE_OTHER): Payer: Medicare Other | Admitting: Family Medicine

## 2018-07-28 ENCOUNTER — Other Ambulatory Visit: Payer: Self-pay

## 2018-07-28 ENCOUNTER — Encounter: Payer: Self-pay | Admitting: Family Medicine

## 2018-07-28 VITALS — BP 106/78 | HR 79 | Temp 98.0°F | Ht 66.0 in | Wt 208.8 lb

## 2018-07-28 DIAGNOSIS — J0100 Acute maxillary sinusitis, unspecified: Secondary | ICD-10-CM | POA: Diagnosis not present

## 2018-07-28 MED ORDER — AZITHROMYCIN 250 MG PO TABS: ORAL_TABLET | ORAL | 0 refills | Status: DC

## 2018-07-28 MED ORDER — CEFTRIAXONE SODIUM 1 G IJ SOLR
1.0000 g | Freq: Once | INTRAMUSCULAR | Status: AC
  Administered 2018-07-28: 1 g via INTRAMUSCULAR

## 2018-07-28 NOTE — Progress Notes (Signed)
Subjective   CC:  Chief Complaint  Patient presents with  . Sinus Problem    headache with facial pain on Left side, had some dry heaves this morning, declines flu shot today     HPI: Marilyn Myers is a 67 y.o. female who presents to the office today to address the problems listed above in the chief complaint.  Patient reports sinus congestion and pressure with thick drainage, mild nonproductive cough, ear pressure without pain, and mild malaise.  Symptoms have been present for several days.Shedenies high fevers, GI symptoms, shortness of breath. Shehas had sinus infections in the past and this feels similar.  Patient is a non-smoker.  No history of asthma or COPD.  Patient is traveling on a cruise tomorrow and request injectable antibiotic if possible.  I reviewed the patients updated PMH, FH, and SocHx.    Patient Active Problem List   Diagnosis Date Noted  . Type 2 diabetes mellitus, uncontrolled, with neuropathy ([place]) 01/28/2015    Priority: High  . Refusal of statin medication by patient 01/24/2018  . Seasonal allergic rhinitis 01/24/2018  . Moderate protein-calorie malnutrition ([place]) 12/03/2016  . PVC's (premature ventricular contractions) 10/08/2016  . Diabetic peripheral neuropathy ([place]) 09/28/2016  . S/P colostomy takedown 07/02/2015  . Osteopenia 05/19/2015  . Essential hypertension 01/28/2015  . Diverticulitis of large intestine 08/19/2014  . Hydradenitis 08/19/2014  . Nephrolithiasis 08/19/2014  . Obstructive sleep apnea 08/01/2014  . Controlled type 2 diabetes mellitus with diabetic polyneuropathy, without long-term current use of insulin ([place]) 06/10/2014   Current Meds  Medication Sig  . Biotin 5000 MCG CAPS Take by mouth.  . Calcium Carbonate-Vitamin D (CALCIUM-VITAMIN D3 PO) Take by mouth.  . Cholecalciferol (VITAMIN D3) 1000 UNITS CAPS Take 1 capsule by mouth daily.  [name] Kitchen diltiazem (CARTIA XT) 120 MG 24 hr capsule Take 1 capsule (120 mg total) by mouth daily.   [name] Kitchen FIBER ADULT GUMMIES 2 g CHEW Chew by mouth.  . gabapentin (NEURONTIN) 600 MG tablet Take 1 tablet (600 mg total) by mouth 4 (four) times daily.  [name] Kitchen losartan-hydrochlorothiazide (HYZAAR) 100-25 MG tablet Take 1 tablet by mouth daily.  . metFORMIN (GLUCOPHAGE) 1000 MG tablet Take 1 tablet (1,000 mg total) by mouth 2 (two) times daily with a meal.  . Multiple Vitamin (MULTIVITAMIN) capsule Take by mouth.  . pantoprazole (PROTONIX) 20 MG tablet Take 1 tablet (20 mg total) by mouth daily.  . traMADol (ULTRAM) 50 MG tablet Take 1-2 tablets (50-100 mg total) by mouth at bedtime as needed.  . zolpidem (AMBIEN) 5 MG tablet Take 1 tablet (5 mg total) by mouth at bedtime as needed for sleep.  . [DISCONTINUED] cetirizine (ZYRTEC) 10 MG tablet Take 1 tablet (10 mg total) by mouth daily.  . [DISCONTINUED] fluticasone (FLONASE) 50 MCG/ACT nasal spray Place 2 sprays into both nostrils daily.   Current Facility-Administered Medications for the 07/28/18 encounter (Office Visit) with [name], MD  Medication  . cefTRIAXone (ROCEPHIN) injection 1 g    Review of Systems: Cardiovascular: negative for chest pain Respiratory: negative for SOB or persistent cough Gastrointestinal: negative for abdominal pain Genitourinary: negative for dysuria or gross hematuria  Objective  Vitals: BP 106/78   Pulse 79   Temp 98 F (36.7 C)   Ht 5\' 6"  (1.676 m)   Wt 208 lb 12.8 oz (94.7 kg)   SpO2 98%   BMI 33.70 kg/m  General: no acute distress  Psych:  Alert and oriented, normal mood and  affect HEENT:  Normocephalic, atraumatic, TMs with serous effusions or retraction w/o erythema, nasal mucosa is red with purulent drainage, tender maxillary sinus present, OP mild erythematous w/o eudate, supple neck with LAD Cardiovascular:  RRR without murmur or gallop. no peripheral edema Respiratory:  Good breath sounds bilaterally, CTAB with normal respiratory effort Skin:  Warm, no rashes Neurologic:   Mental status is  normal. normal gait  Assessment  1. Acute non-recurrent maxillary sinusitis      Plan    Sinusitis: History and exam is most consistent with bacterial sinus infection.  Etiology and prognosis discussed with patient.  Rocephin 1 g IM given today.  Recommend antibiotics as ordered below if symptoms do not completely resolved..  Patient to complete course of antibiotics, use supportive medications like mucolytics and decongestants as needed.  May use Tylenol or Advil if needed.  Symptoms should improve over the next 2 weeks.  Patient will return or call if symptoms persist or worsen.  Follow up: PRN   Commons side effects, risks, benefits, and alternatives for medications and treatment plan prescribed today were discussed, and the patient expressed understanding of the given instructions. Patient is instructed to call or message via MyChart if he/she has any questions or concerns regarding our treatment plan. No barriers to understanding were identified. We discussed Red Flag symptoms and signs in detail. Patient expressed understanding regarding what to do in case of urgent or emergency type symptoms.   Medication list was reconciled, printed and provided to the patient in AVS. Patient instructions and summary information was reviewed with the patient as documented in the AVS. This note was prepared with assistance of Dragon voice recognition software. Occasional wrong-word or sound-a-like substitutions may have occurred due to the inherent limitations of voice recognition software  No orders of the defined types were placed in this encounter.  Meds ordered this encounter  Medications  . cefTRIAXone (ROCEPHIN) injection 1 g  . azithromycin (ZITHROMAX) 250 MG tablet    Sig: Take 2 tabs today, then 1 tab daily for 4 days    Dispense:  1 each    Refill:  0

## 2018-07-28 NOTE — Patient Instructions (Signed)
Follow-up in December as scheduled  Please pick up the prescription for the oral antibiotics and take with you on your trip.  Start if symptoms are not resolved or if they worsen.  You may use Dramamine with your medications or the patch as needed for seasickness.

## 2018-08-29 ENCOUNTER — Other Ambulatory Visit: Payer: Self-pay

## 2018-08-29 ENCOUNTER — Ambulatory Visit: Payer: Medicare Other

## 2018-08-29 ENCOUNTER — Encounter: Payer: Self-pay | Admitting: Family Medicine

## 2018-08-29 ENCOUNTER — Ambulatory Visit (INDEPENDENT_AMBULATORY_CARE_PROVIDER_SITE_OTHER): Payer: Medicare Other | Admitting: Family Medicine

## 2018-08-29 VITALS — BP 122/80 | HR 80 | Temp 98.2°F | Ht 66.0 in | Wt 205.8 lb

## 2018-08-29 DIAGNOSIS — L720 Epidermal cyst: Secondary | ICD-10-CM | POA: Diagnosis not present

## 2018-08-29 DIAGNOSIS — IMO0002 Reserved for concepts with insufficient information to code with codable children: Secondary | ICD-10-CM

## 2018-08-29 DIAGNOSIS — Z23 Encounter for immunization: Secondary | ICD-10-CM

## 2018-08-29 DIAGNOSIS — E1140 Type 2 diabetes mellitus with diabetic neuropathy, unspecified: Secondary | ICD-10-CM | POA: Diagnosis not present

## 2018-08-29 DIAGNOSIS — E1165 Type 2 diabetes mellitus with hyperglycemia: Secondary | ICD-10-CM

## 2018-08-29 MED ORDER — GABAPENTIN 600 MG PO TABS: ORAL_TABLET | ORAL | 3 refills | Status: DC

## 2018-08-29 MED ORDER — AMITRIPTYLINE HCL 50 MG PO TABS: 50.0000 mg | ORAL_TABLET | Freq: Every day | ORAL | 3 refills | Status: DC

## 2018-08-29 MED ORDER — NYSTATIN 100000 UNIT/GM EX POWD: Freq: Four times a day (QID) | CUTANEOUS | 3 refills | Status: DC

## 2018-08-29 NOTE — Progress Notes (Signed)
Subjective  CC:  Chief Complaint  Patient presents with  . Peripheral Neuropathy    pain in feet are causing patient not to be able to sleep, wants to discuss adjustments in medications     HPI: Marilyn Myers is a 67 y.o. female who presents to the office today to address the problems listed above in the chief complaint.  Marilyn Myers walks in today for acute care appointment due to worsening peripheral neuropathy.  Pain management has been difficult.  She is currently using tramadol nightly along with Ambien to help her sleep.  She is on high-dose gabapentin.  Rarely she has used hydrocodone.  However her night pain has worsened.  She can get it to calm down with medications for about 3 to 4 hours but often awakens early in the morning with pain.  No open ulcers or foot sores.  She tolerates the pain during the day by the keeping active.  She has used topicals in the past without resolution of pain.  She brings in the bottle of her narcotics and tramadol showing appropriate use.  Complains of red rash beneath breasts again.  Has history of intertrigo.  Needs refill of medication.  No blistering or sores. Assessment  1. Type 2 diabetes mellitus, uncontrolled, with neuropathy (HCC)      Plan   Peripheral neuropathy, uncontrolled: Counseling done.  Stop Ambien.  Start amitriptyline 50 mg titrating up to 150 mg.  Wean tramadol to once nightly.  Counseling done close follow-up with recheck in 3 to 4 weeks.  Flu vaccination given today.  Intertrigo: Nystatin powder ordered  Follow up: Return in about 4 weeks (around 09/26/2018) for recheck.   Orders Placed This Encounter  Procedures  . Flu vaccine HIGH DOSE PF   Meds ordered this encounter  Medications  . gabapentin (NEURONTIN) 600 MG tablet    Sig: Take 1 tab before each meal and take 2 tabs at bedtime    Dispense:  450 tablet    Refill:  3  . amitriptyline (ELAVIL) 50 MG tablet    Sig: Take 1-3 tablets (50-150 mg total) by mouth at  bedtime.    Dispense:  90 tablet    Refill:  3  . nystatin (MYCOSTATIN/NYSTOP) powder    Sig: Apply topically 4 (four) times daily.    Dispense:  120 g    Refill:  3      I reviewed the patients updated PMH, FH, and SocHx.    Patient Active Problem List   Diagnosis Date Noted  . Type 2 diabetes mellitus, uncontrolled, with neuropathy ([place]) 01/28/2015    Priority: High  . Refusal of statin medication by patient 01/24/2018  . Seasonal allergic rhinitis 01/24/2018  . Moderate protein-calorie malnutrition ([place]) 12/03/2016  . PVC's (premature ventricular contractions) 10/08/2016  . Diabetic peripheral neuropathy ([place]) 09/28/2016  . S/P colostomy takedown 07/02/2015  . Osteopenia 05/19/2015  . Essential hypertension 01/28/2015  . Diverticulitis of large intestine 08/19/2014  . Hydradenitis 08/19/2014  . Nephrolithiasis 08/19/2014  . Obstructive sleep apnea 08/01/2014  . Controlled type 2 diabetes mellitus with diabetic polyneuropathy, without long-term current use of insulin ([place]) 06/10/2014   Current Meds  Medication Sig  . Biotin 5000 MCG CAPS Take by mouth.  . Calcium Carbonate-Vitamin D (CALCIUM-VITAMIN D3 PO) Take by mouth.  . Cholecalciferol (VITAMIN D3) 1000 UNITS CAPS Take 1 capsule by mouth daily.  [name] Kitchen diltiazem (CARTIA XT) 120 MG 24 hr capsule Take 1 capsule (120 mg total) by  mouth daily.  [name] Kitchen FIBER ADULT GUMMIES 2 g CHEW Chew by mouth.  . gabapentin (NEURONTIN) 600 MG tablet Take 1 tab before each meal and take 2 tabs at bedtime  . losartan-hydrochlorothiazide (HYZAAR) 100-25 MG tablet Take 1 tablet by mouth daily.  . metFORMIN (GLUCOPHAGE) 1000 MG tablet Take 1 tablet (1,000 mg total) by mouth 2 (two) times daily with a meal.  . Multiple Vitamin (MULTIVITAMIN) capsule Take by mouth.  . pantoprazole (PROTONIX) 20 MG tablet Take 1 tablet (20 mg total) by mouth daily.  . traMADol (ULTRAM) 50 MG tablet Take 1-2 tablets (50-100 mg total) by mouth at bedtime as needed.  .  [DISCONTINUED] gabapentin (NEURONTIN) 600 MG tablet Take 1 tablet (600 mg total) by mouth 4 (four) times daily.  . [DISCONTINUED] scopolamine (TRANSDERM-SCOP) 1 MG/3DAYS Place 1 patch (1.5 mg total) onto the skin every 3 (three) days.  . [DISCONTINUED] zolpidem (AMBIEN) 5 MG tablet Take 1 tablet (5 mg total) by mouth at bedtime as needed for sleep.    Allergies: Patient is allergic to ace inhibitors; codeine; and flomax [tamsulosin hcl]. Family History: Patient family history includes Breast cancer in her mother; Diabetes in her paternal grandmother; Renal Disease in her mother. Social History:  Patient  reports that she quit smoking about 21 years ago. Her smoking use included cigarettes. She has a 25.00 pack-year smoking history. She has never used smokeless tobacco. She reports that she drinks alcohol. She reports that she does not use drugs.  Review of Systems: Constitutional: Negative for fever malaise or anorexia Cardiovascular: negative for chest pain Respiratory: negative for SOB or persistent cough Gastrointestinal: negative for abdominal pain  Objective  Vitals: BP 122/80   Pulse 80   Temp 98.2 F (36.8 C)   Ht 5\' 6"  (1.676 m)   Wt 205 lb 12.8 oz (93.4 kg)   SpO2 98%   BMI 33.22 kg/m  General: no acute distress , A&Ox3, appears to be in mild amount of pain.  Somewhat anxious Foot exam: No sores or lesions skin:  Warm, no rashes     Commons side effects, risks, benefits, and alternatives for medications and treatment plan prescribed today were discussed, and the patient expressed understanding of the given instructions. Patient is instructed to call or message via MyChart if he/she has any questions or concerns regarding our treatment plan. No barriers to understanding were identified. We discussed Red Flag symptoms and signs in detail. Patient expressed understanding regarding what to do in case of urgent or emergency type symptoms.   Medication list was reconciled,  printed and provided to the patient in AVS. Patient instructions and summary information was reviewed with the patient as documented in the AVS. This note was prepared with assistance of Dragon voice recognition software. Occasional wrong-word or sound-a-like substitutions may have occurred due to the inherent limitations of voice recognition software

## 2018-08-29 NOTE — Patient Instructions (Signed)
Please return in 3-4 weeks to recheck neuropathy pain and medications.   Start the new medication nightly and increase the gabapentin at night. Over the next 2 weeks, wean down to one tramadol nightly.  Stop the [place].   If you have any questions or concerns, please don't hesitate to send me a message via MyChart or call the office at [phone]. Thank you for visiting with Marilyn Myers today! It's our pleasure caring for you.

## 2018-08-31 ENCOUNTER — Ambulatory Visit (INDEPENDENT_AMBULATORY_CARE_PROVIDER_SITE_OTHER): Payer: Medicare Other | Admitting: Obstetrics & Gynecology

## 2018-08-31 ENCOUNTER — Encounter: Payer: Self-pay | Admitting: Obstetrics & Gynecology

## 2018-08-31 VITALS — BP 124/70 | Ht 66.0 in | Wt 205.0 lb

## 2018-08-31 DIAGNOSIS — Z780 Asymptomatic menopausal state: Secondary | ICD-10-CM

## 2018-08-31 DIAGNOSIS — E6609 Other obesity due to excess calories: Secondary | ICD-10-CM

## 2018-08-31 DIAGNOSIS — Z01419 Encounter for gynecological examination (general) (routine) without abnormal findings: Secondary | ICD-10-CM | POA: Diagnosis not present

## 2018-08-31 DIAGNOSIS — Z6833 Body mass index (BMI) 33.0-33.9, adult: Secondary | ICD-10-CM

## 2018-08-31 NOTE — Patient Instructions (Signed)
1. Well female exam with routine gynecological exam Normal gynecologic exam and menopause.  Pap test was negative in 2018.  No indication to repeat the Pap test this year.  Breast exam normal.  Recent screening mammogram normal per patient, will obtain the report.  Health labs with family physician.  Colonoscopy done recently, will obtain the report.  2. Post-menopausal Well on no hormone replacement therapy.  No postmenopausal bleeding.  Bone density this year 2019 was normal.  Continue with vitamin D supplements, calcium intake of 1.5 g/day and regular weightbearing physical activity.  Repeat bone density in 5 years.  3. Class 1 obesity due to excess calories without serious comorbidity with body mass index (BMI) of 33.0 to 33.9 in adult Recommend lower calorie/carb diet such as [employer].  Aerobic physical activity 5 times a week and weightlifting every 2 days.  Marilyn Myers, it was a pleasure seeing you today!

## 2018-08-31 NOTE — Progress Notes (Signed)
Marilyn Myers Mar 03, 1951 [id number]   History:    67 y.o. G3P3L3 Married.  Many grand-children, youngest is 40 yo in [place].  RP:  Established patient presenting for annual gyn exam   HPI: Well on no hormone replacement therapy.  No postmenopausal bleeding.  No pelvic pain.  Normal vaginal secretions.  Abstinent.  Urine and bowel movements normal.  Breasts normal.  Body mass index 33.09.  Enjoys walking.  Health labs with family physician.  Recent colonoscopy done.  Past medical history,surgical history, family history and social history were all reviewed and documented in the EPIC chart.  Gynecologic History No LMP recorded. Patient is postmenopausal. Contraception: post menopausal status and tubal ligation Last Pap: 05/2017. Results were: Negative Last mammogram: 2019. Results were: normal per patient, will obtain report Bone Density: 06/2018 Normal Colonoscopy: 2019, will obtain report  Obstetric History OB History  Gravida Para Term Preterm AB Living  3 3       3   SAB TAB Ectopic Multiple Live Births               # Outcome Date GA Lbr Len/2nd Weight Sex Delivery Anes PTL Lv  3 Para           2 Para           1 Para              ROS: A ROS was performed and pertinent positives and negatives are included in the history.  GENERAL: No fevers or chills. HEENT: No change in vision, no earache, sore throat or sinus congestion. NECK: No pain or stiffness. CARDIOVASCULAR: No chest pain or pressure. No palpitations. PULMONARY: No shortness of breath, cough or wheeze. GASTROINTESTINAL: No abdominal pain, nausea, vomiting or diarrhea, melena or bright red blood per rectum. GENITOURINARY: No urinary frequency, urgency, hesitancy or dysuria. MUSCULOSKELETAL: No joint or muscle pain, no back pain, no recent trauma. DERMATOLOGIC: No rash, no itching, no lesions. ENDOCRINE: No polyuria, polydipsia, no heat or cold intolerance. No recent change in weight. HEMATOLOGICAL: No anemia or easy  bruising or bleeding. NEUROLOGIC: No headache, seizures, numbness, tingling or weakness. PSYCHIATRIC: No depression, no loss of interest in normal activity or change in sleep pattern.     Exam:   BP 124/70   Ht 5\' 6"  (1.676 m)   Wt 205 lb (93 kg)   BMI 33.09 kg/m   Body mass index is 33.09 kg/m.  General appearance : Well developed well nourished female. No acute distress HEENT: Eyes: no retinal hemorrhage or exudates,  Neck supple, trachea midline, no carotid bruits, no thyroidmegaly Lungs: Clear to auscultation, no rhonchi or wheezes, or rib retractions  Heart: Regular rate and rhythm, no murmurs or gallops Breast:Examined in sitting and supine position were symmetrical in appearance, no palpable masses or tenderness,  no skin retraction, no nipple inversion, no nipple discharge, no skin discoloration, no axillary or supraclavicular lymphadenopathy Abdomen: no palpable masses or tenderness, no rebound or guarding Extremities: no edema or skin discoloration or tenderness  Pelvic: Vulva: Normal             Vagina: No gross lesions or discharge  Cervix: No gross lesions or discharge  Uterus  AV, normal size, shape and consistency, non-tender and mobile  Adnexa  Without masses or tenderness  Anus: Normal   Assessment/Plan:  67 y.o. female for annual exam   1. Well female exam with routine gynecological exam Normal gynecologic exam and menopause.  Pap test  was negative in 2018.  No indication to repeat the Pap test this year.  Breast exam normal.  Recent screening mammogram normal per patient, will obtain the report.  Health labs with family physician.  Colonoscopy done recently, will obtain the report.  2. Post-menopausal Well on no hormone replacement therapy.  No postmenopausal bleeding.  Bone density this year 2019 was normal.  Continue with vitamin D supplements, calcium intake of 1.5 g/day and regular weightbearing physical activity.  Repeat bone density in 5 years.  3.  Class 1 obesity due to excess calories without serious comorbidity with body mass index (BMI) of 33.0 to 33.9 in adult Recommend lower calorie/carb diet such as [employer].  Aerobic physical activity 5 times a week and weightlifting every 2 days.  Princess Bruins MD, 12:15 PM 08/31/2018

## 2018-09-07 DIAGNOSIS — Z4802 Encounter for removal of sutures: Secondary | ICD-10-CM | POA: Diagnosis not present

## 2018-09-19 ENCOUNTER — Other Ambulatory Visit: Payer: Self-pay | Admitting: Family Medicine

## 2018-09-19 ENCOUNTER — Encounter: Payer: Self-pay | Admitting: Family Medicine

## 2018-09-19 ENCOUNTER — Ambulatory Visit (INDEPENDENT_AMBULATORY_CARE_PROVIDER_SITE_OTHER): Payer: Medicare Other | Admitting: Family Medicine

## 2018-09-19 ENCOUNTER — Other Ambulatory Visit: Payer: Self-pay

## 2018-09-19 VITALS — BP 124/88 | HR 96 | Temp 97.6°F | Ht 66.0 in | Wt 206.4 lb

## 2018-09-19 DIAGNOSIS — Z5320 Procedure and treatment not carried out because of patient's decision for unspecified reasons: Secondary | ICD-10-CM

## 2018-09-19 DIAGNOSIS — I10 Essential (primary) hypertension: Secondary | ICD-10-CM

## 2018-09-19 DIAGNOSIS — E1142 Type 2 diabetes mellitus with diabetic polyneuropathy: Secondary | ICD-10-CM

## 2018-09-19 DIAGNOSIS — Z5329 Procedure and treatment not carried out because of patient's decision for other reasons: Secondary | ICD-10-CM | POA: Diagnosis not present

## 2018-09-19 LAB — POCT GLYCOSYLATED HEMOGLOBIN (HGB A1C): Hemoglobin A1C: 6.3 % — AB (ref 4.0–5.6)

## 2018-09-19 MED ORDER — AMITRIPTYLINE HCL 50 MG PO TABS: 25.0000 mg | ORAL_TABLET | Freq: Every day | ORAL | 3 refills | Status: DC

## 2018-09-19 MED ORDER — TRAMADOL HCL 50 MG PO TABS: 50.0000 mg | ORAL_TABLET | Freq: Every evening | ORAL | 3 refills | Status: DC | PRN

## 2018-09-19 NOTE — Patient Instructions (Addendum)
Please return in December as scheduled for your physical. Come fasting.   See if you can tolerate 1/4-1/2 dose of the amitriptyline.   If you have any questions or concerns, please don't hesitate to send me a message via MyChart or call the office at [phone]. Thank you for visiting with [place] today! It's our pleasure caring for you.

## 2018-09-19 NOTE — Progress Notes (Signed)
Subjective  CC:  Chief Complaint  Patient presents with  . Peripheral Neuropathy    3 week follow-up, unable to take Medications, due to drowsiness    HPI: Marilyn Myers is a 67 y.o. female who presents to the office today for follow up of diabetes and problems listed above in the chief complaint.   Diabetes follow up: Her diabetic control is reported as Unchanged. Doing well with meds and diet.  She denies exertional CP or SOB or symptomatic hypoglycemia. She denies foot sores or paresthesias. Still working on weight loss.  Peripheral neuropathy: feeling better. Increased gabapentin and taking tramadol 100mg  nightly. Didn't tolerate the elavil 50 - tried x 2 doses: felt too groggy.   HTN - stable on meds. No cp or leg edema.   Assessment  1. Diabetic peripheral neuropathy ([place])   2. Controlled type 2 diabetes mellitus with diabetic polyneuropathy, without long-term current use of insulin ([place])   3. Essential hypertension   4. Refusal of statin medication by patient      Plan   Diabetes is currently very well controlled. Continue same meds and diet. Foot care reinforced. Nl exam today.   DM neuropathy improved. However, still would prefer her to use less tramadol due to long term side effects/dependency issues . See if can tolerate 12.5 - 25 of elavil. Educated on tricyclics for pain control. Continue high dose gabapentin (failed lyrica)   Follow up: has cpe appt in December for fasting labs. . Orders Placed This Encounter  Procedures  . POCT glycosylated hemoglobin (Hb A1C)   Meds ordered this encounter  Medications  . amitriptyline (ELAVIL) 50 MG tablet    Sig: Take 0.5 tablets (25 mg total) by mouth at bedtime.    Dispense:  90 tablet    Refill:  3  . traMADol (ULTRAM) 50 MG tablet    Sig: Take 1-2 tablets (50-100 mg total) by mouth at bedtime as needed.    Dispense:  90 tablet    Refill:  3    Not to exceed 5 additional fills before 09/10/2018       Immunization History  Administered Date(s) Administered  . Influenza, High Dose Seasonal PF 08/19/2014, 09/02/2015, 10/08/2016, 09/22/2017, 08/29/2018  . Pneumococcal Conjugate-13 10/18/2016  . Pneumococcal Polysaccharide-23 08/19/2014    Diabetes Related Lab Review: Lab Results  Component Value Date   HGBA1C 6.3 (A) 09/19/2018   HGBA1C 6.3 (A) 04/25/2018   HGBA1C 7.0 01/24/2018    No results found for: Derl Barrow Lab Results  Component Value Date   CREATININE 0.89 07/26/2014   BUN 20 07/26/2014   NA 141 10/11/2014   K 4.0 10/11/2014   CL 98 07/26/2014   CO2 21 07/26/2014   No results found for: CHOL No results found for: HDL No results found for: LDLCALC No results found for: TRIG No results found for: CHOLHDL No results found for: LDLDIRECT The ASCVD Risk score Mikey Bussing DC Jr., et al., 2013) failed to calculate for the following reasons:   Cannot find a previous HDL lab I have reviewed the [facility], Fam and Soc history. Patient Active Problem List   Diagnosis Date Noted  . Type 2 diabetes mellitus, uncontrolled, with neuropathy ([place]) 01/28/2015    Priority: High  . Refusal of statin medication by patient 01/24/2018    Husband has problems with this medication for her   . Seasonal allergic rhinitis 01/24/2018  . Moderate protein-calorie malnutrition ([place]) 12/03/2016  . PVC's (premature ventricular contractions) 10/08/2016  .  Diabetic peripheral neuropathy ([place]) 09/28/2016    Failed lyrica   . S/P colostomy takedown 07/02/2015  . Osteopenia 05/19/2015    Overview:  T = -1.4 Dexa 2016.   [name] Kitchen Essential hypertension 01/28/2015  . Diverticulitis of large intestine 08/19/2014    Overview:  2015   . Hydradenitis 08/19/2014  . Nephrolithiasis 08/19/2014    Overview:  Dr. Kellie Simmering   . Obstructive sleep apnea 08/01/2014  . Controlled type 2 diabetes mellitus with diabetic polyneuropathy, without long-term current use of insulin ([place]) 06/10/2014    Social  History: Patient  reports that she quit smoking about 21 years ago. Her smoking use included cigarettes. She has a 25.00 pack-year smoking history. She has never used smokeless tobacco. She reports that she drinks alcohol. She reports that she does not use drugs.  Review of Systems: Ophthalmic: negative for eye pain, loss of vision or double vision Cardiovascular: negative for chest pain Respiratory: negative for SOB or persistent cough Gastrointestinal: negative for abdominal pain Genitourinary: negative for dysuria or gross hematuria MSK: negative for foot lesions Neurologic: negative for weakness or gait disturbance  Objective  Vitals: BP 124/88   Pulse 96   Temp 97.6 F (36.4 C)   Ht 5\' 6"  (1.676 m)   Wt 206 lb 6.4 oz (93.6 kg)   SpO2 98%   BMI 33.31 kg/m  General: well appearing, no acute distress  Psych:  Alert and oriented, normal mood and affect HEENT:  Normocephalic, atraumatic, moist mucous membranes, supple neck  Cardiovascular:  Nl S1 and S2, RRR without murmur, gallop or rub. no edema Respiratory:  Good breath sounds bilaterally, CTAB with normal effort, no rales Skin:  Warm, no rashes Neurologic:   Mental status is normal. normal gait Foot exam: no erythema, pallor, or cyanosis visible nl proprioception and sensation to monofilament testing bilaterally, +2 distal pulses bilaterally    Diabetic education: ongoing education regarding chronic disease management for diabetes was given today. We continue to reinforce the ABC's of diabetic management: A1c (<7 or 8 dependent upon patient), tight blood pressure control, and cholesterol management with goal LDL < 100 minimally. We discuss diet strategies, exercise recommendations, medication options and possible side effects. At each visit, we review recommended immunizations and preventive care recommendations for diabetics and stress that good diabetic control can prevent other problems. See below for this patient's  data.    Commons side effects, risks, benefits, and alternatives for medications and treatment plan prescribed today were discussed, and the patient expressed understanding of the given instructions. Patient is instructed to call or message via MyChart if he/she has any questions or concerns regarding our treatment plan. No barriers to understanding were identified. We discussed Red Flag symptoms and signs in detail. Patient expressed understanding regarding what to do in case of urgent or emergency type symptoms.   Medication list was reconciled, printed and provided to the patient in AVS. Patient instructions and summary information was reviewed with the patient as documented in the AVS. This note was prepared with assistance of Dragon voice recognition software. Occasional wrong-word or sound-a-like substitutions may have occurred due to the inherent limitations of voice recognition software

## 2018-09-28 ENCOUNTER — Other Ambulatory Visit: Payer: Self-pay | Admitting: Emergency Medicine

## 2018-09-28 MED ORDER — LOSARTAN POTASSIUM 100 MG PO TABS: 100.0000 mg | ORAL_TABLET | Freq: Every day | ORAL | 3 refills | Status: DC

## 2018-09-28 MED ORDER — HYDROCHLOROTHIAZIDE 25 MG PO TABS: 25.0000 mg | ORAL_TABLET | Freq: Every day | ORAL | 3 refills | Status: DC

## 2018-10-02 DIAGNOSIS — D3612 Benign neoplasm of peripheral nerves and autonomic nervous system, upper limb, including shoulder: Secondary | ICD-10-CM | POA: Diagnosis not present

## 2018-10-02 DIAGNOSIS — D485 Neoplasm of uncertain behavior of skin: Secondary | ICD-10-CM | POA: Diagnosis not present

## 2018-10-02 DIAGNOSIS — L821 Other seborrheic keratosis: Secondary | ICD-10-CM | POA: Diagnosis not present

## 2018-10-25 ENCOUNTER — Ambulatory Visit: Payer: Medicare Other | Admitting: Family Medicine

## 2018-10-30 ENCOUNTER — Other Ambulatory Visit: Payer: Self-pay | Admitting: Family Medicine

## 2018-10-31 ENCOUNTER — Other Ambulatory Visit: Payer: Self-pay | Admitting: Emergency Medicine

## 2018-10-31 ENCOUNTER — Telehealth: Payer: Self-pay | Admitting: Emergency Medicine

## 2018-10-31 NOTE — Telephone Encounter (Signed)
Spoke with Patient, she has prescriptions for Tramadol she will take her pharmacy. She was unsure if she was supposed to start with these or finish her refills. I advised her to take to pharmacy. She verbalized understanding.   Doloris Hall,  LPN

## 2018-10-31 NOTE — Telephone Encounter (Signed)
Looks like it was a printed prescription. Pt should have the printed copy?

## 2018-10-31 NOTE — Telephone Encounter (Signed)
Request from Pharmacy, states that never received prescription from 09/19/2018 for #90 with 3 RF.   Please advise.   Doloris Hall,  LPN

## 2018-10-31 NOTE — Telephone Encounter (Signed)
LM to RC  

## 2018-11-06 DIAGNOSIS — Z903 Acquired absence of stomach [part of]: Secondary | ICD-10-CM | POA: Diagnosis not present

## 2018-11-06 DIAGNOSIS — K912 Postsurgical malabsorption, not elsewhere classified: Secondary | ICD-10-CM | POA: Diagnosis not present

## 2018-11-06 LAB — VITAMIN D 25 HYDROXY (VIT D DEFICIENCY, FRACTURES): Vit D, 25-Hydroxy: 43.9

## 2018-11-06 LAB — CBC AND DIFFERENTIAL
HCT: 37 (ref 36–46)
Hemoglobin: 12.2 (ref 12.0–16.0)
WBC: 9.1

## 2018-11-06 LAB — HEMOGLOBIN A1C: Hemoglobin A1C: 6.4

## 2018-11-06 LAB — BASIC METABOLIC PANEL
BUN: 18 (ref 4–21)
Creatinine: 1.1 (ref 0.5–1.1)
Glucose: 157
Potassium: 3.6 (ref 3.4–5.3)
Sodium: 139 (ref 137–147)

## 2018-11-06 LAB — IRON,TIBC AND FERRITIN PANEL: FERRITIN: 37

## 2018-11-06 LAB — HEPATIC FUNCTION PANEL
ALT: 15 (ref 7–35)
AST: 18 (ref 13–35)
Bilirubin, Total: 0.6

## 2018-11-06 LAB — LIPID PANEL
Cholesterol: 184 (ref 0–200)
HDL: 40 (ref 35–70)
LDL CALC: 87
Triglycerides: 284 — AB (ref 40–160)

## 2018-11-23 DIAGNOSIS — Z903 Acquired absence of stomach [part of]: Secondary | ICD-10-CM | POA: Diagnosis not present

## 2018-11-23 DIAGNOSIS — K912 Postsurgical malabsorption, not elsewhere classified: Secondary | ICD-10-CM | POA: Diagnosis not present

## 2018-11-23 DIAGNOSIS — I10 Essential (primary) hypertension: Secondary | ICD-10-CM | POA: Diagnosis not present

## 2018-11-23 DIAGNOSIS — E1142 Type 2 diabetes mellitus with diabetic polyneuropathy: Secondary | ICD-10-CM | POA: Diagnosis not present

## 2018-11-24 DIAGNOSIS — Z903 Acquired absence of stomach [part of]: Secondary | ICD-10-CM | POA: Diagnosis not present

## 2018-11-24 DIAGNOSIS — Z6831 Body mass index (BMI) 31.0-31.9, adult: Secondary | ICD-10-CM | POA: Diagnosis not present

## 2018-11-24 DIAGNOSIS — I10 Essential (primary) hypertension: Secondary | ICD-10-CM | POA: Diagnosis not present

## 2018-11-24 DIAGNOSIS — E6609 Other obesity due to excess calories: Secondary | ICD-10-CM | POA: Diagnosis not present

## 2018-11-24 DIAGNOSIS — E1142 Type 2 diabetes mellitus with diabetic polyneuropathy: Secondary | ICD-10-CM | POA: Diagnosis not present

## 2018-11-27 ENCOUNTER — Ambulatory Visit: Payer: Medicare Other | Admitting: Family Medicine

## 2018-11-28 ENCOUNTER — Encounter: Payer: Self-pay | Admitting: Family Medicine

## 2018-11-28 ENCOUNTER — Encounter: Payer: Self-pay | Admitting: *Deleted

## 2018-11-28 ENCOUNTER — Ambulatory Visit (INDEPENDENT_AMBULATORY_CARE_PROVIDER_SITE_OTHER): Payer: Medicare Other | Admitting: Family Medicine

## 2018-11-28 ENCOUNTER — Other Ambulatory Visit: Payer: Self-pay

## 2018-11-28 VITALS — BP 124/80 | HR 76 | Temp 97.9°F | Resp 16 | Ht 66.0 in | Wt 203.0 lb

## 2018-11-28 DIAGNOSIS — Z5329 Procedure and treatment not carried out because of patient's decision for other reasons: Secondary | ICD-10-CM | POA: Diagnosis not present

## 2018-11-28 DIAGNOSIS — E440 Moderate protein-calorie malnutrition: Secondary | ICD-10-CM

## 2018-11-28 DIAGNOSIS — E1142 Type 2 diabetes mellitus with diabetic polyneuropathy: Secondary | ICD-10-CM | POA: Diagnosis not present

## 2018-11-28 DIAGNOSIS — I10 Essential (primary) hypertension: Secondary | ICD-10-CM

## 2018-11-28 DIAGNOSIS — Z5320 Procedure and treatment not carried out because of patient's decision for unspecified reasons: Secondary | ICD-10-CM

## 2018-11-28 LAB — POCT GLYCOSYLATED HEMOGLOBIN (HGB A1C): Hemoglobin A1C: 6.5 % — AB (ref 4.0–5.6)

## 2018-11-28 MED ORDER — TRAMADOL HCL 50 MG PO TABS: 50.0000 mg | ORAL_TABLET | Freq: Every evening | ORAL | 1 refills | Status: DC | PRN

## 2018-11-28 NOTE — Patient Instructions (Addendum)
Please return in 3 months for diabetes follow up Things look good.   Good luck with your weight loss.   I have given a prescription for tramadol, a 3 month supply with a refill. Please use this for your next refill.   Medicare recommends an Annual Wellness Visit for all patients. Please schedule this to be done with our Nurse Educator, Maudie Mercury. This is an informative "talk" visit; it's goals are to ensure that your health care needs are being met and to give you education regarding avoiding falls, ensuring you are not suffering from depression or problems with memory or thinking, and to educate you on Advance Care Planning. It helps me take good care of you!  If you have any questions or concerns, please don't hesitate to send me a message via MyChart or call the office at [phone]. Thank you for visiting with [place] today! It's our pleasure caring for you.

## 2018-11-28 NOTE — Progress Notes (Signed)
Subjective  CC:  Chief Complaint  Patient presents with  . Follow-up  . Diabetes  . Hyperlipidemia  . Hypertension    HPI: Marilyn Myers is a 68 y.o. female who presents to the office today for follow up of diabetes and problems listed above in the chief complaint.   Diabetes follow up: Her diabetic control is reported as Unchanged.  She has been well controlled on metformin twice a day. She denies exertional CP or SOB or symptomatic hypoglycemia. She denies foot sores or paresthesias.  She has chronic peripheral neuropathy that is painful.  She uses Ultram nightly for pain.  She is also on high-dose gabapentin.  She failed Elavil.  Felt it did not give her any relief and had too many side effects.  Hyperlipidemia is well controlled but patient will not take a statin.  She recently had blood testing through bariatrics.  I reviewed, CMP, CBC, lipids.  They have been abstracted.  Hypertension: Well-controlled.Feeling well. Taking medications w/o adverse effects. No symptoms of CHF, angina; no palpitations, sob, cp or lower extremity edema. Compliant with meds.   I reviewed bariatrics note.  Recently started medication for weight loss.  Annual follow-up with bariatrics was stable.  She will sign a controlled substance agreement form today for her chronic tramadol.  We agreed to supply a 90-day prescription.  Urine drug screen will be collected as well.  She is due annual wellness visit.  Assessment  1. Controlled type 2 diabetes mellitus with diabetic polyneuropathy, without long-term current use of insulin ([place])   2. Refusal of statin medication by patient   3. Moderate protein-calorie malnutrition ([place])   4. Diabetic peripheral neuropathy ([place])   5. Essential hypertension      Plan   Diabetes is currently very well controlled.  Continue current medications and diet.  Immunizations are up-to-date  Hypertension: This medical condition is well controlled. There are no signs of  complications, medication side effects, or red flags. Patient is instructed to continue the current treatment plan without change in therapies or medications.  Renal function is stable  Peripheral neuropathy on chronic tramadol.  Refill medications today for 6 months.  Continue high-dose gabapentin.  UDS and controlled substance agreement completed.   Obesity: per bariatric center.   Follow up: Return in about 3 months (around 02/27/2019) for follow up of diabetes and hypertension, AWV.[name] Kitchen Orders Placed This Encounter  Procedures  . Pain Mgmt, Profile 8 w/Conf, U  . POCT glycosylated hemoglobin (Hb A1C)   Meds ordered this encounter  Medications  . traMADol (ULTRAM) 50 MG tablet    Sig: Take 1-2 tablets (50-100 mg total) by mouth at bedtime as needed.    Dispense:  180 tablet    Refill:  1    Not to exceed 5 additional fills before 09/10/2018      Immunization History  Administered Date(s) Administered  . Influenza, High Dose Seasonal PF 08/19/2014, 09/02/2015, 10/08/2016, 09/22/2017, 08/29/2018  . Pneumococcal Conjugate-13 10/18/2016  . Pneumococcal Polysaccharide-23 08/19/2014    Diabetes Related Lab Review: Lab Results  Component Value Date   HGBA1C 6.5 (A) 11/28/2018   HGBA1C 6.4 11/06/2018   HGBA1C 6.3 (A) 09/19/2018    No results found for: Derl Barrow Lab Results  Component Value Date   CREATININE 1.1 11/06/2018   BUN 18 11/06/2018   NA 139 11/06/2018   K 3.6 11/06/2018   CL 98 07/26/2014   CO2 21 07/26/2014   Lab Results  Component Value  Date   CHOL 184 11/06/2018   Lab Results  Component Value Date   HDL 40 11/06/2018   Lab Results  Component Value Date   LDLCALC 87 11/06/2018   Lab Results  Component Value Date   TRIG 284 (A) 11/06/2018   No results found for: CHOLHDL No results found for: LDLDIRECT The 10-year ASCVD risk score Mikey Bussing DC Jr., et al., 2013) is: 20.5%*   Values used to calculate the score:     Age: 36 years     Sex:  Female     Is Non-Hispanic African American: No     Diabetic: Yes     Tobacco smoker: No     Systolic Blood Pressure: 326 mmHg     Is BP treated: Yes     HDL Cholesterol: 40 mg/dL*     Total Cholesterol: 184 mg/dL     * - Cholesterol units were assumed for this score calculation I have reviewed the PMH, Fam and Soc history. Patient Active Problem List   Diagnosis Date Noted  . Type 2 diabetes mellitus, uncontrolled, with neuropathy ([place]) 01/28/2015    Priority: High  . Refusal of statin medication by patient 01/24/2018    Husband has problems with this medication for her   . Seasonal allergic rhinitis 01/24/2018  . Moderate protein-calorie malnutrition ([place]) 12/03/2016  . PVC's (premature ventricular contractions) 10/08/2016  . Diabetic peripheral neuropathy ([place]) 09/28/2016    Failed lyrica   . S/P colostomy takedown 07/02/2015  . Osteopenia 05/19/2015    Overview:  T = -1.4 Dexa 2016.   [name] Kitchen Essential hypertension 01/28/2015  . Diverticulitis of large intestine 08/19/2014    Overview:  2015   . Hydradenitis 08/19/2014  . Nephrolithiasis 08/19/2014    Overview:  Dr. Kellie Simmering   . Obstructive sleep apnea 08/01/2014  . Controlled type 2 diabetes mellitus with diabetic polyneuropathy, without long-term current use of insulin ([place]) 06/10/2014    Social History: Patient  reports that she quit smoking about 22 years ago. Her smoking use included cigarettes. She has a 25.00 pack-year smoking history. She has never used smokeless tobacco. She reports current alcohol use. She reports that she does not use drugs.  Review of Systems: Ophthalmic: negative for eye pain, loss of vision or double vision Cardiovascular: negative for chest pain Respiratory: negative for SOB or persistent cough Gastrointestinal: negative for abdominal pain Genitourinary: negative for dysuria or gross hematuria MSK: negative for foot lesions Neurologic: negative for weakness or gait  disturbance  Objective  Vitals: BP 124/80   Pulse 76   Temp 97.9 F (36.6 C)   Resp 16   Ht 5\' 6"  (1.676 m)   Wt 203 lb (92.1 kg)   SpO2 98%   BMI 32.77 kg/m  General: well appearing, no acute distress  Psych:  Alert and oriented, normal mood and affect HEENT:  Normocephalic, atraumatic, moist mucous membranes, supple neck  Cardiovascular:  Nl S1 and S2, RRR without murmur, gallop or rub. no edema Respiratory:  Good breath sounds bilaterally, CTAB with normal effort, no rales Gastrointestinal: normal BS, soft, nontender, well-healed scars. Skin:  Warm, no rashes Neurologic:   Mental status is normal. normal gait Foot exam: no erythema, pallor, or cyanosis visible nl proprioception and sensation to monofilament testing bilaterally, +2 distal pulses bilaterally    Diabetic education: ongoing education regarding chronic disease management for diabetes was given today. We continue to reinforce the ABC's of diabetic management: A1c (<7 or 8 dependent upon patient), tight  blood pressure control, and cholesterol management with goal LDL < 100 minimally. We discuss diet strategies, exercise recommendations, medication options and possible side effects. At each visit, we review recommended immunizations and preventive care recommendations for diabetics and stress that good diabetic control can prevent other problems. See below for this patient's data.    Commons side effects, risks, benefits, and alternatives for medications and treatment plan prescribed today were discussed, and the patient expressed understanding of the given instructions. Patient is instructed to call or message via MyChart if he/she has any questions or concerns regarding our treatment plan. No barriers to understanding were identified. We discussed Red Flag symptoms and signs in detail. Patient expressed understanding regarding what to do in case of urgent or emergency type symptoms.   Medication list was reconciled,  printed and provided to the patient in AVS. Patient instructions and summary information was reviewed with the patient as documented in the AVS. This note was prepared with assistance of Dragon voice recognition software. Occasional wrong-word or sound-a-like substitutions may have occurred due to the inherent limitations of voice recognition software

## 2018-11-29 LAB — PAIN MGMT, PROFILE 8 W/CONF, U
6 Acetylmorphine: NEGATIVE ng/mL (ref <10)
AMPHETAMINES: NEGATIVE ng/mL (ref <500)
Alcohol Metabolites: NEGATIVE ng/mL (ref <500)
BUPRENORPHINE, URINE: NEGATIVE ng/mL (ref <5)
Benzodiazepines: NEGATIVE ng/mL (ref <100)
Cocaine Metabolite: NEGATIVE ng/mL (ref <150)
Creatinine: 114.1 mg/dL
MDMA: NEGATIVE ng/mL (ref <500)
Marijuana Metabolite: NEGATIVE ng/mL (ref <20)
Opiates: NEGATIVE ng/mL (ref <100)
Oxidant: NEGATIVE ug/mL (ref <200)
Oxycodone: NEGATIVE ng/mL (ref <100)
pH: 6.39 (ref 4.5–9.0)

## 2018-12-06 ENCOUNTER — Other Ambulatory Visit: Payer: Self-pay | Admitting: Family Medicine

## 2018-12-06 NOTE — Progress Notes (Signed)
Subjective:   Marilyn Myers is a 68 y.o. female who presents for Medicare Annual (Subsequent) preventive examination.  Review of Systems:  No ROS.  Medicare Wellness Visit. Additional risk factors are reflected in the social history.  Cardiac Risk Factors include: advanced age (>72men, >88 women);obesity (BMI >30kg/m2);hypertension;sedentary lifestyle;family history of premature cardiovascular disease;diabetes mellitus;dyslipidemia   Sleep patterns: sleeps 5 hours Home Safety/Smoke Alarms: Feels safe in home. Smoke alarms in place.  Living environment; residence and Firearm Safety: Lives alone in 3 story home.   Seat Belt Safety/Bike Helmet: Wears seat belt.   Female:   Pap-N/A       Mammo-05/30/2018, benign.      Dexa scan-07/04/2018, normal.      CCS-11/22/2017, [place]. Pt reports normal, recall 5 years.      Objective:     Vitals: BP 130/70 (BP Location: Left Arm, Patient Position: Sitting, Cuff Size: Normal)   Pulse (!) 44   Ht 5\' 6"  (1.676 m)   Wt 203 lb (92.1 kg)   SpO2 99%   BMI 32.77 kg/m   Body mass index is 32.77 kg/m.  Advanced Directives 12/07/2018 12/01/2017 04/26/2016 07/26/2014 01/24/2012 10/08/2011 10/05/2011  Does Patient Have a Medical Advance Directive? Yes Yes Yes No Patient does not have advance directive Patient does not have advance directive Patient does not have advance directive  Type of Advance Directive Living will;Healthcare Power of [place];Living will [place];Living will - - - -  Does patient want to make changes to medical advance directive? - - No - Patient declined - - - -  Copy of [place] in Chart? No - copy requested No - copy requested No - copy requested - - - -  Would patient like information on creating a medical advance directive? - - - No - patient declined information - - -  Pre-existing out of facility DNR order (yellow form or pink MOST form) - - - - No No  -    Tobacco Social History   Tobacco Use  Smoking Status Former Smoker  . Packs/day: 1.00  . Years: 25.00  . Pack years: 25.00  . Types: Cigarettes  . Last attempt to quit: 10/07/1996  . Years since quitting: 22.1  Smokeless Tobacco Never Used     Counseling given: Not Answered   Past Medical History:  Diagnosis Date  . Anxiety   . Diverticulitis of intestine without perforation or abscess   . Diverticulosis, sigmoid   . GERD (gastroesophageal reflux disease)   . History of diverticulitis of colon    09/ 2015  RESOLVED   . History of kidney stones   . Hypertension   . Nephrolithiasis    BILATERAL  . OSA on CPAP    wears cpap  . Right ureteral stone   . Seasonal allergic rhinitis 01/24/2018  . Type 2 diabetes mellitus ([place])    Past Surgical History:  Procedure Laterality Date  . BARIATRIC SURGERY    . CERVICAL CONE BIOPSY  1976  . COLON RESECTION     colostomy x 6 months  . CYSTO/  BILATERAL RETROGRADE PYELOGRAM/  PLACEMENT BILATERAL URETERAL STENTS  07-10-2011  . CYSTOSCOPY WITH RETROGRADE PYELOGRAM, URETEROSCOPY AND STENT PLACEMENT Right 10/11/2014   Procedure: CYSTOSCOPY WITH RETROGRADE PYELOGRAM, URETEROSCOPY, right ureteral biopsy, AND STENT PLACEMENT;  Surgeon: Arvil Persons, MD;  Location[facility];  Service: Urology;  Laterality: Right;  . CYSTOSCOPY/RETROGRADE/URETEROSCOPY  10/08/2011  Procedure: CYSTOSCOPY/RETROGRADE/URETEROSCOPY;  Surgeon: Hanley Ben, MD;  Location[facility];  Service: Urology;  Laterality: N/A;  CYSTOSCOPY, RIGHT  RETROGRADE, RIGHT URETEROSCOPY HOLMIUM LASER WITH BASKET STONE EXTRACTION AND RIGHT URETERAL STENT PLACEMENT  . EXTRACORPOREAL SHOCK WAVE LITHOTRIPSY  x3  last one 01-24-2012  . TUBAL LIGATION  1987  . URETEROSCPIC STONE EXTRACTION/ STENT PLACEMENT  left 08-20-2011   Family History  Problem Relation Age of Onset  . Breast cancer Mother        53  . Renal Disease Mother   . Heart  disease Mother   . Diabetes Paternal Grandmother   . Diabetes Maternal Aunt    Social History   Socioeconomic History  . Marital status: Married    Spouse name: Not on file  . Number of children: Not on file  . Years of education: Not on file  . Highest education level: Not on file  Occupational History  . Not on file  Social Needs  . Financial resource strain: Not on file  . Food insecurity:    Worry: Not on file    Inability: Not on file  . Transportation needs:    Medical: Not on file    Non-medical: Not on file  Tobacco Use  . Smoking status: Former Smoker    Packs/day: 1.00    Years: 25.00    Pack years: 25.00    Types: Cigarettes    Last attempt to quit: 10/07/1996    Years since quitting: 22.1  . Smokeless tobacco: Never Used  Substance and Sexual Activity  . Alcohol use: Yes    Comment: rare  . Drug use: No  . Sexual activity: Never  Lifestyle  . Physical activity:    Days per week: Not on file    Minutes per session: Not on file  . Stress: Not on file  Relationships  . Social connections:    Talks on phone: Not on file    Gets together: Not on file    Attends religious service: Not on file    Active member of club or organization: Not on file    Attends meetings of clubs or organizations: Not on file    Relationship status: Not on file  Other Topics Concern  . Not on file  Social History Narrative  . Not on file    Outpatient Encounter Medications as of 12/07/2018  Medication Sig  . Biotin 5000 MCG CAPS Take by mouth.  . Calcium Carbonate-Vitamin D (CALCIUM-VITAMIN D3 PO) Take by mouth.  . Cetirizine HCl (ZYRTEC ALLERGY PO) Take by mouth.  . Cholecalciferol (VITAMIN D3) 1000 UNITS CAPS Take 1 capsule by mouth daily.  . Cyanocobalamin (VITAMIN B 12 PO) Take by mouth.  . diltiazem (CARDIZEM CD) 120 MG 24 hr capsule TAKE ONE CAPSULE BY MOUTH EVERY DAY  . FA-B6-B12-Omega 3-Phytosterols (BP VIT 3 PO) Take by mouth.  . FIBER ADULT GUMMIES 2 g CHEW  Chew by mouth.  . FOLIC ACID PO Take by mouth.  . gabapentin (NEURONTIN) 600 MG tablet TAKE 1 TABLET 4 TIMES A DAY  . hydrochlorothiazide (HYDRODIURIL) 25 MG tablet Take 1 tablet (25 mg total) by mouth daily.  . Lorcaserin HCl (BELVIQ) 10 MG TABS Take 1 tablet by mouth daily.  [name] Kitchen losartan (COZAAR) 100 MG tablet Take 1 tablet (100 mg total) by mouth daily.  . metFORMIN (GLUCOPHAGE) 1000 MG tablet TAKE 1 TABLET BY MOUTH TWICE A DAY  . Multiple Vitamin (MULTIVITAMIN) capsule Take by mouth.  [name] Kitchen  pantoprazole (PROTONIX) 20 MG tablet TAKE 1 TABLET BY MOUTH EVERY DAY  . traMADol (ULTRAM) 50 MG tablet Take 1-2 tablets (50-100 mg total) by mouth at bedtime as needed.   No facility-administered encounter medications on file as of 12/07/2018.     Activities of Daily Living In your present state of health, do you have any difficulty performing the following activities: 12/07/2018 01/24/2018  Hearing? N N  Vision? N N  Difficulty concentrating or making decisions? N N  Walking or climbing stairs? N N  Dressing or bathing? N N  Doing errands, shopping? N N  Preparing Food and eating ? N -  Using the Toilet? N -  In the past six months, have you accidently leaked urine? N -  Do you have problems with loss of bowel control? N -  Managing your Medications? N -  Managing your Finances? N -  Some recent data might be hidden    Patient Care Team: [name], MD as PCP - General (Family Medicine) Dasher, Jeneen Rinks, MD (Surgery) Princess Bruins, MD as Consulting Physician (Obstetrics and Gynecology)    Assessment:   This is a routine wellness examination for [place].  Exercise Activities and Dietary recommendations Current Exercise Habits: The patient does not participate in regular exercise at present(Cleans house), Exercise limited by: None identified   Diet (meal preparation, eat out, water intake, caffeinated beverages, dairy products, fruits and vegetables): Drinks crystal light and coffee.    Breakfast: Fasting Lunch: protein; salad; vegetables Dinner: protein and vegetables.    Goals    . Patient Stated     Follow diet and organize/clean house    . Weight (lb) < 200 lb (90.7 kg)     Begin "bariatric diet" again.        Fall Risk Fall Risk  12/07/2018 01/24/2018 12/01/2017  Falls in the past year? 0 No No    Depression Screen PHQ 2/9 Scores 12/07/2018 01/24/2018 12/01/2017  PHQ - 2 Score 0 0 0     Cognitive Function MMSE - Mini Mental State Exam 12/07/2018 12/01/2017  Orientation to time 5 5  Orientation to Place 5 5  Registration 3 3  Attention/ Calculation 5 5  Recall 3 3  Language- name 2 objects 2 2  Language- repeat 1 1  Language- follow 3 step command 3 3  Language- read & follow direction 1 1  Write a sentence 1 1  Copy design 1 1  Total score 30 30        Immunization History  Administered Date(s) Administered  . Influenza, High Dose Seasonal PF 08/19/2014, 09/02/2015, 10/08/2016, 09/22/2017, 08/29/2018  . Pneumococcal Conjugate-13 10/18/2016  . Pneumococcal Polysaccharide-23 08/19/2014    Screening Tests Health Maintenance  Topic Date Due  . Hepatitis C Screening  12/08/2019 (Originally May 18, 1951)  . HEMOGLOBIN A1C  05/29/2019  . MAMMOGRAM  05/31/2019  . OPHTHALMOLOGY EXAM  06/29/2019  . FOOT EXAM  09/20/2019  . DEXA SCAN  07/04/2021  . COLONOSCOPY  08/22/2024  . INFLUENZA VACCINE  Completed  . PNA vac Low Risk Adult  Completed        Plan:    Bring a copy of your living will and/or healthcare power of attorney to your next office visit.  Continue doing brain stimulating activities (puzzles, reading, adult coloring books, staying active) to keep memory sharp.   I have personally reviewed and noted the following in the patient's chart:   . Medical and social history . Use of alcohol,  tobacco or illicit drugs  . Current medications and supplements . Functional ability and status . Nutritional status . Physical  activity . Advanced directives . List of other physicians . Hospitalizations, surgeries, and ER visits in previous 12 months . Vitals . Screenings to include cognitive, depression, and falls . Referrals and appointments  In addition, I have reviewed and discussed with patient certain preventive protocols, quality metrics, and best practice recommendations. A written personalized care plan for preventive services as well as general preventive health recommendations were provided to patient.     Gerilyn Nestle, RN  12/07/2018   PCP Notes: -Pts husband passed last week, doing well.  -F/U with PCP 02/2019

## 2018-12-07 ENCOUNTER — Other Ambulatory Visit: Payer: Self-pay

## 2018-12-07 ENCOUNTER — Ambulatory Visit (INDEPENDENT_AMBULATORY_CARE_PROVIDER_SITE_OTHER): Payer: Medicare Other

## 2018-12-07 VITALS — BP 130/70 | HR 44 | Ht 66.0 in | Wt 203.0 lb

## 2018-12-07 DIAGNOSIS — Z0000 Encounter for general adult medical examination without abnormal findings: Secondary | ICD-10-CM

## 2018-12-07 DIAGNOSIS — E669 Obesity, unspecified: Secondary | ICD-10-CM

## 2018-12-07 NOTE — Patient Instructions (Addendum)
Bring a copy of your living will and/or healthcare power of attorney to your next office visit.  Continue doing brain stimulating activities (puzzles, reading, adult coloring books, staying active) to keep memory sharp.    Health Maintenance, Female Adopting a healthy lifestyle and getting preventive care can go a long way to promote health and wellness. Talk with your health care provider about what schedule of regular examinations is right for you. This is a good chance for you to check in with your provider about disease prevention and staying healthy. In between checkups, there are plenty of things you can do on your own. Experts have done a lot of research about which lifestyle changes and preventive measures are most likely to keep you healthy. Ask your health care provider for more information. Weight and diet Eat a healthy diet  Be sure to include plenty of vegetables, fruits, low-fat dairy products, and lean protein.  Do not eat a lot of foods high in solid fats, added sugars, or salt.  Get regular exercise. This is one of the most important things you can do for your health. ? Most adults should exercise for at least 150 minutes each week. The exercise should increase your heart rate and make you sweat (moderate-intensity exercise). ? Most adults should also do strengthening exercises at least twice a week. This is in addition to the moderate-intensity exercise. Maintain a healthy weight  Body mass index (BMI) is a measurement that can be used to identify possible weight problems. It estimates body fat based on height and weight. Your health care provider can help determine your BMI and help you achieve or maintain a healthy weight.  For females 66 years of age and older: ? A BMI below 18.5 is considered underweight. ? A BMI of 18.5 to 24.9 is normal. ? A BMI of 25 to 29.9 is considered overweight. ? A BMI of 30 and above is considered obese. Watch levels of cholesterol and blood  lipids  You should start having your blood tested for lipids and cholesterol at 70 years of age, then have this test every 5 years.  You may need to have your cholesterol levels checked more often if: ? Your lipid or cholesterol levels are high. ? You are older than 70 years of age. ? You are at high risk for heart disease. Cancer screening Lung Cancer  Lung cancer screening is recommended for adults 85-24 years old who are at high risk for lung cancer because of a history of smoking.  A yearly low-dose CT scan of the lungs is recommended for people who: ? Currently smoke. ? Have quit within the past 15 years. ? Have at least a 30-pack-year history of smoking. A pack year is smoking an average of one pack of cigarettes a day for 1 year.  Yearly screening should continue until it has been 15 years since you quit.  Yearly screening should stop if you develop a health problem that would prevent you from having lung cancer treatment. Breast Cancer  Practice breast self-awareness. This means understanding how your breasts normally appear and feel.  It also means doing regular breast self-exams. Let your health care provider know about any changes, no matter how small.  If you are in your 20s or 30s, you should have a clinical breast exam (CBE) by a health care provider every 1-3 years as part of a regular health exam.  If you are 35 or older, have a CBE every year. Also consider  having a breast X-ray (mammogram) every year.  If you have a family history of breast cancer, talk to your health care provider about genetic screening.  If you are at high risk for breast cancer, talk to your health care provider about having an MRI and a mammogram every year.  Breast cancer gene (BRCA) assessment is recommended for women who have family members with BRCA-related cancers. BRCA-related cancers include: ? Breast. ? Ovarian. ? Tubal. ? Peritoneal cancers.  Results of the assessment will  determine the need for genetic counseling and BRCA1 and BRCA2 testing. Cervical Cancer Your health care provider may recommend that you be screened regularly for cancer of the pelvic organs (ovaries, uterus, and vagina). This screening involves a pelvic examination, including checking for microscopic changes to the surface of your cervix (Pap test). You may be encouraged to have this screening done every 3 years, beginning at age 77.  For women ages 60-65, health care providers may recommend pelvic exams and Pap testing every 3 years, or they may recommend the Pap and pelvic exam, combined with testing for human papilloma virus (HPV), every 5 years. Some types of HPV increase your risk of cervical cancer. Testing for HPV may also be done on women of any age with unclear Pap test results.  Other health care providers may not recommend any screening for nonpregnant women who are considered low risk for pelvic cancer and who do not have symptoms. Ask your health care provider if a screening pelvic exam is right for you.  If you have had past treatment for cervical cancer or a condition that could lead to cancer, you need Pap tests and screening for cancer for at least 20 years after your treatment. If Pap tests have been discontinued, your risk factors (such as having a new sexual partner) need to be reassessed to determine if screening should resume. Some women have medical problems that increase the chance of getting cervical cancer. In these cases, your health care provider may recommend more frequent screening and Pap tests. Colorectal Cancer  This type of cancer can be detected and often prevented.  Routine colorectal cancer screening usually begins at 70 years of age and continues through 70 years of age.  Your health care provider may recommend screening at an earlier age if you have risk factors for colon cancer.  Your health care provider may also recommend using home test kits to check for  hidden blood in the stool.  A small camera at the end of a tube can be used to examine your colon directly (sigmoidoscopy or colonoscopy). This is done to check for the earliest forms of colorectal cancer.  Routine screening usually begins at age 55.  Direct examination of the colon should be repeated every 5-10 years through 70 years of age. However, you may need to be screened more often if early forms of precancerous polyps or small growths are found. Skin Cancer  Check your skin from head to toe regularly.  Tell your health care provider about any new moles or changes in moles, especially if there is a change in a mole's shape or color.  Also tell your health care provider if you have a mole that is larger than the size of a pencil eraser.  Always use sunscreen. Apply sunscreen liberally and repeatedly throughout the day.  Protect yourself by wearing long sleeves, pants, a wide-brimmed hat, and sunglasses whenever you are outside. Heart disease, diabetes, and high blood pressure  High blood pressure  causes heart disease and increases the risk of stroke. High blood pressure is more likely to develop in: ? People who have blood pressure in the high end of the normal range (130-139/85-89 mm Hg). ? People who are overweight or obese. ? People who are African American.  If you are 45-58 years of age, have your blood pressure checked every 3-5 years. If you are 85 years of age or older, have your blood pressure checked every year. You should have your blood pressure measured twice-once when you are at a hospital or clinic, and once when you are not at a hospital or clinic. Record the average of the two measurements. To check your blood pressure when you are not at a hospital or clinic, you can use: ? An automated blood pressure machine at a pharmacy. ? A home blood pressure monitor.  If you are between 59 years and 78 years old, ask your health care provider if you should take aspirin to  prevent strokes.  Have regular diabetes screenings. This involves taking a blood sample to check your fasting blood sugar level. ? If you are at a normal weight and have a low risk for diabetes, have this test once every three years after 70 years of age. ? If you are overweight and have a high risk for diabetes, consider being tested at a younger age or more often. Preventing infection Hepatitis B  If you have a higher risk for hepatitis B, you should be screened for this virus. You are considered at high risk for hepatitis B if: ? You were born in a country where hepatitis B is common. Ask your health care provider which countries are considered high risk. ? Your parents were born in a high-risk country, and you have not been immunized against hepatitis B (hepatitis B vaccine). ? You have HIV or AIDS. ? You use needles to inject street drugs. ? You live with someone who has hepatitis B. ? You have had sex with someone who has hepatitis B. ? You get hemodialysis treatment. ? You take certain medicines for conditions, including cancer, organ transplantation, and autoimmune conditions. Hepatitis C  Blood testing is recommended for: ? Everyone born from [birth year] through 1967. ? Anyone with known risk factors for hepatitis C. Sexually transmitted infections (STIs)  You should be screened for sexually transmitted infections (STIs) including gonorrhea and chlamydia if: ? You are sexually active and are younger than 70 years of age. ? You are older than 70 years of age and your health care provider tells you that you are at risk for this type of infection. ? Your sexual activity has changed since you were last screened and you are at an increased risk for chlamydia or gonorrhea. Ask your health care provider if you are at risk.  If you do not have HIV, but are at risk, it may be recommended that you take a prescription medicine daily to prevent HIV infection. This is called pre-exposure  prophylaxis (PrEP). You are considered at risk if: ? You are sexually active and do not regularly use condoms or know the HIV status of your partner(s). ? You take drugs by injection. ? You are sexually active with a partner who has HIV. Talk with your health care provider about whether you are at high risk of being infected with HIV. If you choose to begin PrEP, you should first be tested for HIV. You should then be tested every 3 months for as long as you are  taking PrEP. Pregnancy  If you are premenopausal and you may become pregnant, ask your health care provider about preconception counseling.  If you may become pregnant, take 400 to 800 micrograms (mcg) of folic acid every day.  If you want to prevent pregnancy, talk to your health care provider about birth control (contraception). Osteoporosis and menopause  Osteoporosis is a disease in which the bones lose minerals and strength with aging. This can result in serious bone fractures. Your risk for osteoporosis can be identified using a bone density scan.  If you are 45 years of age or older, or if you are at risk for osteoporosis and fractures, ask your health care provider if you should be screened.  Ask your health care provider whether you should take a calcium or vitamin D supplement to lower your risk for osteoporosis.  Menopause may have certain physical symptoms and risks.  Hormone replacement therapy may reduce some of these symptoms and risks. Talk to your health care provider about whether hormone replacement therapy is right for you. Follow these instructions at home:  Schedule regular health, dental, and eye exams.  Stay current with your immunizations.  Do not use any tobacco products including cigarettes, chewing tobacco, or electronic cigarettes.  If you are pregnant, do not drink alcohol.  If you are breastfeeding, limit how much and how often you drink alcohol.  Limit alcohol intake to no more than 1 drink  per day for nonpregnant women. One drink equals 12 ounces of beer, 5 ounces of wine, or 1 ounces of hard liquor.  Do not use street drugs.  Do not share needles.  Ask your health care provider for help if you need support or information about quitting drugs.  Tell your health care provider if you often feel depressed.  Tell your health care provider if you have ever been abused or do not feel safe at home. This information is not intended to replace advice given to you by your health care provider. Make sure you discuss any questions you have with your health care provider. Document Released: 05/24/2011 Document Revised: 04/15/2016 Document Reviewed: 08/12/2015 Elsevier Interactive Patient Education  2019 [employer].

## 2018-12-08 ENCOUNTER — Encounter: Payer: Self-pay | Admitting: *Deleted

## 2018-12-08 NOTE — Progress Notes (Signed)
I have reviewed the documentation from the recent AWV done by Kim Broome; I agree with the documentation and will follow up on any recommendations or abnormal findings as suggested.  

## 2018-12-12 ENCOUNTER — Encounter: Payer: Self-pay | Admitting: Family Medicine

## 2018-12-12 ENCOUNTER — Ambulatory Visit (INDEPENDENT_AMBULATORY_CARE_PROVIDER_SITE_OTHER): Payer: Medicare Other | Admitting: Family Medicine

## 2018-12-12 ENCOUNTER — Other Ambulatory Visit (HOSPITAL_COMMUNITY)
Admission: RE | Admit: 2018-12-12 | Discharge: 2018-12-12 | Disposition: A | Discharge status: Home / Self Care | Payer: Medicare Other | Source: Ambulatory Visit | Attending: Family Medicine | Admitting: Family Medicine

## 2018-12-12 ENCOUNTER — Other Ambulatory Visit: Payer: Self-pay

## 2018-12-12 VITALS — BP 118/76 | HR 80 | Temp 98.0°F | Resp 16

## 2018-12-12 DIAGNOSIS — N898 Other specified noninflammatory disorders of vagina: Secondary | ICD-10-CM | POA: Diagnosis not present

## 2018-12-12 DIAGNOSIS — R300 Dysuria: Secondary | ICD-10-CM

## 2018-12-12 DIAGNOSIS — N3000 Acute cystitis without hematuria: Secondary | ICD-10-CM | POA: Diagnosis not present

## 2018-12-12 LAB — POCT URINALYSIS DIPSTICK
Bilirubin, UA: NEGATIVE
Glucose, UA: NEGATIVE
Ketones, UA: NEGATIVE
PH UA: 6 (ref 5.0–8.0)
Protein, UA: NEGATIVE
Spec Grav, UA: 1.015 (ref 1.010–1.025)
Urobilinogen, UA: 4 E.U./dL — AB

## 2018-12-12 MED ORDER — SULFAMETHOXAZOLE-TRIMETHOPRIM 800-160 MG PO TABS: 1.0000 | ORAL_TABLET | Freq: Two times a day (BID) | ORAL | 0 refills | Status: AC

## 2018-12-12 NOTE — Progress Notes (Signed)
Subjective   CC:  Chief Complaint  Patient presents with  . Urinary Tract Infection    Symptoms started yesterday.. Itching and frequency     HPI: Marilyn Myers is a 68 y.o. female who presents to the office today to address the problems listed above in the chief complaint. Patient reports dysuria and urinary frequency.  She has sensation of increased urinary pressure.  She denies fevers flank pain nausea vomiting or gross hematuria.  Symptoms have been present for several hours to days.  She denies history of interstitial cystitis.  She also has vaginal symptoms including vaginal itching w/o pain.   Assessment  1. Acute cystitis without hematuria   2. Dysuria   3. Vagina itching      Plan   Treat cystitis. Await culture and vaginal swab. Self treat with OTC monistat.   Follow up: prn  Orders Placed This Encounter  Procedures  . Urine Culture  . POCT urinalysis dipstick   Meds ordered this encounter  Medications  . sulfamethoxazole-trimethoprim (BACTRIM DS,SEPTRA DS) 800-160 MG tablet    Sig: Take 1 tablet by mouth 2 (two) times daily for 5 days.    Dispense:  10 tablet    Refill:  0      I reviewed the patients updated PMH, FH, and SocHx.    Patient Active Problem List   Diagnosis Date Noted  . Type 2 diabetes mellitus, uncontrolled, with neuropathy ([place]) 01/28/2015    Priority: High  . Refusal of statin medication by patient 01/24/2018  . Seasonal allergic rhinitis 01/24/2018  . Moderate protein-calorie malnutrition ([place]) 12/03/2016  . PVC's (premature ventricular contractions) 10/08/2016  . Diabetic peripheral neuropathy ([place]) 09/28/2016  . S/P colostomy takedown 07/02/2015  . Osteopenia 05/19/2015  . Essential hypertension 01/28/2015  . Diverticulitis of large intestine 08/19/2014  . Hydradenitis 08/19/2014  . Nephrolithiasis 08/19/2014  . Obstructive sleep apnea 08/01/2014  . Controlled type 2 diabetes mellitus with diabetic polyneuropathy, without  long-term current use of insulin ([place]) 06/10/2014   Current Meds  Medication Sig  . Biotin 5000 MCG CAPS Take by mouth.  . Calcium Carbonate-Vitamin D (CALCIUM-VITAMIN D3 PO) Take by mouth.  . Cetirizine HCl (ZYRTEC ALLERGY PO) Take by mouth.  . Cholecalciferol (VITAMIN D3) 1000 UNITS CAPS Take 1 capsule by mouth daily.  . Cyanocobalamin (VITAMIN B 12 PO) Take by mouth.  . diltiazem (CARDIZEM CD) 120 MG 24 hr capsule TAKE ONE CAPSULE BY MOUTH EVERY DAY  . FA-B6-B12-Omega 3-Phytosterols (BP VIT 3 PO) Take by mouth.  . FIBER ADULT GUMMIES 2 g CHEW Chew by mouth.  . FOLIC ACID PO Take by mouth.  . gabapentin (NEURONTIN) 600 MG tablet TAKE 1 TABLET 4 TIMES A DAY  . hydrochlorothiazide (HYDRODIURIL) 25 MG tablet Take 1 tablet (25 mg total) by mouth daily.  . Lorcaserin HCl (BELVIQ) 10 MG TABS Take 1 tablet by mouth daily.  [name] Kitchen losartan (COZAAR) 100 MG tablet Take 1 tablet (100 mg total) by mouth daily.  . metFORMIN (GLUCOPHAGE) 1000 MG tablet TAKE 1 TABLET BY MOUTH TWICE A DAY  . Multiple Vitamin (MULTIVITAMIN) capsule Take by mouth.  . pantoprazole (PROTONIX) 20 MG tablet TAKE 1 TABLET BY MOUTH EVERY DAY  . traMADol (ULTRAM) 50 MG tablet Take 1-2 tablets (50-100 mg total) by mouth at bedtime as needed.    Review of Systems: Cardiovascular: negative for chest pain Respiratory: negative for SOB or persistent cough Gastrointestinal: negative for abdominal pain Constitutional: Negative for fever malaise or  anorexia  Objective  Vitals: BP 118/76   Pulse 80   Temp 98 F (36.7 C) (Oral)   Resp 16   SpO2 94%  General: no acute distress  Psych:  Alert and oriented, normal mood and affect Cardiovascular:  RRR without murmur or gallop. no peripheral edema Respiratory:  Good breath sounds bilaterally, CTAB with normal respiratory effort Gastrointestinal: soft, flat abdomen, normal active bowel sounds, no palpable masses, no hepatosplenomegaly, no appreciated hernias, NO CVAT, mild suprapubic  ttp w/o rebound or guarding Skin:  Warm, no rashes Neurologic:   Mental status is normal. normal gait Office Visit on 12/12/2018  Component Date Value Ref Range Status  . Color, UA 12/12/2018 Yellow   Final  . Glucose, UA 12/12/2018 Negative  Negative Final  . Bilirubin, UA 12/12/2018 Negative   Final  . Ketones, UA 12/12/2018 Negative   Final  . Spec Grav, UA 12/12/2018 1.015  1.010 - 1.025 Final  . Blood, UA 12/12/2018 5-10   Final  . pH, UA 12/12/2018 6.0  5.0 - 8.0 Final  . Protein, UA 12/12/2018 Negative  Negative Final  . Urobilinogen, UA 12/12/2018 4.0* 0.2 or 1.0 E.U./dL Final  . Nitrite, UA 12/12/2018 +   Final  . Leukocytes, UA 12/12/2018 Large (3+)* Negative Final    Commons side effects, risks, benefits, and alternatives for medications and treatment plan prescribed today were discussed, and the patient expressed understanding of the given instructions. Patient is instructed to call or message via MyChart if he/she has any questions or concerns regarding our treatment plan. No barriers to understanding were identified. We discussed Red Flag symptoms and signs in detail. Patient expressed understanding regarding what to do in case of urgent or emergency type symptoms.   Medication list was reconciled, printed and provided to the patient in AVS. Patient instructions and summary information was reviewed with the patient as documented in the AVS. This note was prepared with assistance of Dragon voice recognition software. Occasional wrong-word or sound-a-like substitutions may have occurred due to the inherent limitations of voice recognition software

## 2018-12-12 NOTE — Patient Instructions (Signed)
Please follow up if symptoms do not improve or as needed.    Urinary Tract Infection, Adult  A urinary tract infection (UTI) is an infection of any part of the urinary tract. The urinary tract includes the kidneys, ureters, bladder, and urethra. These organs make, store, and get rid of urine in the body. Your health care provider may use other names to describe the infection. An upper UTI affects the ureters and kidneys (pyelonephritis). A lower UTI affects the bladder (cystitis) and urethra (urethritis). What are the causes? Most urinary tract infections are caused by bacteria in your genital area, around the entrance to your urinary tract (urethra). These bacteria grow and cause inflammation of your urinary tract. What increases the risk? You are more likely to develop this condition if:  You have a urinary catheter that stays in place (indwelling).  You are not able to control when you urinate or have a bowel movement (you have incontinence).  You are female and you: ? Use a spermicide or diaphragm for birth control. ? Have low estrogen levels. ? Are pregnant.  You have certain genes that increase your risk (genetics).  You are sexually active.  You take antibiotic medicines.  You have a condition that causes your flow of urine to slow down, such as: ? An enlarged prostate, if you are female. ? Blockage in your urethra (stricture). ? A kidney stone. ? A nerve condition that affects your bladder control (neurogenic bladder). ? Not getting enough to drink, or not urinating often.  You have certain medical conditions, such as: ? Diabetes. ? A weak disease-fighting system (immunesystem). ? Sickle cell disease. ? Gout. ? Spinal cord injury. What are the signs or symptoms? Symptoms of this condition include:  Needing to urinate right away (urgently).  Frequent urination or passing small amounts of urine frequently.  Pain or burning with urination.  Blood in the  urine.  Urine that smells bad or unusual.  Trouble urinating.  Cloudy urine.  Vaginal discharge, if you are female.  Pain in the abdomen or the lower back. You may also have:  Vomiting or a decreased appetite.  Confusion.  Irritability or tiredness.  A fever.  Diarrhea. The first symptom in older adults may be confusion. In some cases, they may not have any symptoms until the infection has worsened. How is this diagnosed? This condition is diagnosed based on your medical history and a physical exam. You may also have other tests, including:  Urine tests.  Blood tests.  Tests for sexually transmitted infections (STIs). If you have had more than one UTI, a cystoscopy or imaging studies may be done to determine the cause of the infections. How is this treated? Treatment for this condition includes:  Antibiotic medicine.  Over-the-counter medicines to treat discomfort.  Drinking enough water to stay hydrated. If you have frequent infections or have other conditions such as a kidney stone, you may need to see a health care provider who specializes in the urinary tract (urologist). In rare cases, urinary tract infections can cause sepsis. Sepsis is a life-threatening condition that occurs when the body responds to an infection. Sepsis is treated in the hospital with IV antibiotics, fluids, and other medicines. Follow these instructions at home:  Medicines  Take over-the-counter and prescription medicines only as told by your health care provider.  If you were prescribed an antibiotic medicine, take it as told by your health care provider. Do not stop using the antibiotic even if you start to  feel better. General instructions  Make sure you: ? Empty your bladder often and completely. Do not hold urine for long periods of time. ? Empty your bladder after sex. ? Wipe from front to back after a bowel movement if you are female. Use each tissue one time when you  wipe.  Drink enough fluid to keep your urine pale yellow.  Keep all follow-up visits as told by your health care provider. This is important. Contact a health care provider if:  Your symptoms do not get better after 1-2 days.  Your symptoms go away and then return. Get help right away if you have:  Severe pain in your back or your lower abdomen.  A fever.  Nausea or vomiting. Summary  A urinary tract infection (UTI) is an infection of any part of the urinary tract, which includes the kidneys, ureters, bladder, and urethra.  Most urinary tract infections are caused by bacteria in your genital area, around the entrance to your urinary tract (urethra).  Treatment for this condition often includes antibiotic medicines.  If you were prescribed an antibiotic medicine, take it as told by your health care provider. Do not stop using the antibiotic even if you start to feel better.  Keep all follow-up visits as told by your health care provider. This is important. This information is not intended to replace advice given to you by your health care provider. Make sure you discuss any questions you have with your health care provider. Document Released: 08/18/2005 Document Revised: 05/18/2018 Document Reviewed: 05/18/2018 Elsevier Interactive Patient Education  2019 [employer].

## 2018-12-14 LAB — URINE CULTURE
MICRO NUMBER:: 84468
SPECIMEN QUALITY:: ADEQUATE

## 2018-12-14 LAB — CERVICOVAGINAL ANCILLARY ONLY
BACTERIAL VAGINITIS: NEGATIVE
Candida vaginitis: NEGATIVE
Trichomonas: NEGATIVE

## 2018-12-15 NOTE — Progress Notes (Signed)
+   UTI sensitive to abx used. No further action needed

## 2019-01-11 ENCOUNTER — Encounter: Payer: Self-pay | Admitting: Family Medicine

## 2019-01-11 ENCOUNTER — Other Ambulatory Visit: Payer: Self-pay

## 2019-01-11 ENCOUNTER — Ambulatory Visit (INDEPENDENT_AMBULATORY_CARE_PROVIDER_SITE_OTHER): Payer: Medicare Other | Admitting: Family Medicine

## 2019-01-11 VITALS — BP 136/84 | HR 81 | Temp 98.4°F | Resp 16 | Ht 66.0 in | Wt 195.9 lb

## 2019-01-11 DIAGNOSIS — J029 Acute pharyngitis, unspecified: Secondary | ICD-10-CM | POA: Diagnosis not present

## 2019-01-11 DIAGNOSIS — J36 Peritonsillar abscess: Secondary | ICD-10-CM

## 2019-01-11 LAB — POCT RAPID STREP A (OFFICE): Rapid Strep A Screen: NEGATIVE

## 2019-01-11 MED ORDER — CEFTRIAXONE SODIUM 1 G IJ SOLR
1.0000 g | Freq: Once | INTRAMUSCULAR | Status: AC
  Administered 2019-01-11: 1 g via INTRAMUSCULAR

## 2019-01-11 MED ORDER — CEFTRIAXONE SODIUM 1 G IJ SOLR: 1.0000 g | Freq: Once | INTRAMUSCULAR | Status: DC

## 2019-01-11 MED ORDER — AMOXICILLIN-POT CLAVULANATE 875-125 MG PO TABS: 1.0000 | ORAL_TABLET | Freq: Two times a day (BID) | ORAL | 0 refills | Status: DC

## 2019-01-11 MED ORDER — HYDROCODONE-ACETAMINOPHEN 5-325 MG PO TABS: 1.0000 | ORAL_TABLET | Freq: Four times a day (QID) | ORAL | 0 refills | Status: AC | PRN

## 2019-01-11 MED ORDER — ONDANSETRON 4 MG PO TBDP: 4.0000 mg | ORAL_TABLET | Freq: Three times a day (TID) | ORAL | 0 refills | Status: DC | PRN

## 2019-01-11 NOTE — Progress Notes (Signed)
Subjective  CC:  Chief Complaint  Patient presents with  . Sore Throat    Symptoms started 01/09/19.Marilyn Myers feels closed, headache, chills, fatigue, and weakness. Tried OTC medications with no relief    HPI: Marilyn Myers is a 68 y.o. female who presents to the office today to address the problems listed above in the chief complaint.  Marilyn Myers presents with a 2-day history of right-sided sore throat.  Pain is moderate to severe.  Radiates mildly to the right ear.  No left-sided pain.  No fevers, myalgias, chills, or cough.  Having trouble eating because of pain.  Cannot take NSAIDs due to gastric bypass status.  No known exposures to strep.  She denies nasal congestion.  She has felt mildly nauseous without emesis.  No diarrhea.  Assessment  1. Peritonsillar cellulitis   2. Sore throat      Plan   Peritonsillar cellulitis: Patient's vitals are stable and she appears nontoxic however she does feel uncomfortable.  Education given.  Start hydrocodone for pain, cannot take NSAIDs, hydrate, Zofran as needed for nausea, Augmentin and we gave her Rocephin 1 g in the office today.  Extensive counseling given regarding possibility of worsening into an abscess.  See after visit summary for instructions.  Patient understands and agrees with care plan.  Follow up: Return in about 4 days (around 01/15/2019) for recheck if needed.  02/22/2019  Orders Placed This Encounter  Procedures  . Culture, Group A Strep  . POCT rapid strep A   Meds ordered this encounter  Medications  . cefTRIAXone (ROCEPHIN) injection 1 g  . HYDROcodone-acetaminophen (NORCO) 5-325 MG tablet    Sig: Take 1 tablet by mouth every 6 (six) hours as needed for up to 5 days for moderate pain.    Dispense:  20 tablet    Refill:  0  . ondansetron (ZOFRAN ODT) 4 MG disintegrating tablet    Sig: Take 1 tablet (4 mg total) by mouth every 8 (eight) hours as needed for nausea or vomiting.    Dispense:  20 tablet    Refill:  0  .  amoxicillin-clavulanate (AUGMENTIN) 875-125 MG tablet    Sig: Take 1 tablet by mouth 2 (two) times daily.    Dispense:  14 tablet    Refill:  0      I reviewed the patients updated PMH, FH, and SocHx.    Patient Active Problem List   Diagnosis Date Noted  . Type 2 diabetes mellitus, uncontrolled, with neuropathy ([place]) 01/28/2015    Priority: High  . Refusal of statin medication by patient 01/24/2018  . Seasonal allergic rhinitis 01/24/2018  . Moderate protein-calorie malnutrition ([place]) 12/03/2016  . PVC's (premature ventricular contractions) 10/08/2016  . Diabetic peripheral neuropathy ([place]) 09/28/2016  . S/P colostomy takedown 07/02/2015  . Osteopenia 05/19/2015  . Essential hypertension 01/28/2015  . Diverticulitis of large intestine 08/19/2014  . Hydradenitis 08/19/2014  . Nephrolithiasis 08/19/2014  . Obstructive sleep apnea 08/01/2014  . Controlled type 2 diabetes mellitus with diabetic polyneuropathy, without long-term current use of insulin ([place]) 06/10/2014   Current Meds  Medication Sig  . Biotin 5000 MCG CAPS Take by mouth.  . Calcium Carbonate-Vitamin D (CALCIUM-VITAMIN D3 PO) Take by mouth.  . Cetirizine HCl (ZYRTEC ALLERGY PO) Take by mouth.  . Cholecalciferol (VITAMIN D3) 1000 UNITS CAPS Take 1 capsule by mouth daily.  . Cyanocobalamin (VITAMIN B 12 PO) Take by mouth.  . diltiazem (CARDIZEM CD) 120 MG 24 hr capsule TAKE  ONE CAPSULE BY MOUTH EVERY DAY  . FA-B6-B12-Omega 3-Phytosterols (BP VIT 3 PO) Take by mouth.  . FIBER ADULT GUMMIES 2 g CHEW Chew by mouth.  . FOLIC ACID PO Take by mouth.  . gabapentin (NEURONTIN) 600 MG tablet TAKE 1 TABLET 4 TIMES A DAY  . hydrochlorothiazide (HYDRODIURIL) 25 MG tablet Take 1 tablet (25 mg total) by mouth daily.  . Lorcaserin HCl (BELVIQ) 10 MG TABS Take 1 tablet by mouth daily.  [name] Kitchen losartan (COZAAR) 100 MG tablet Take 1 tablet (100 mg total) by mouth daily.  . metFORMIN (GLUCOPHAGE) 1000 MG tablet TAKE 1 TABLET BY MOUTH TWICE  A DAY  . Multiple Vitamin (MULTIVITAMIN) capsule Take by mouth.  . pantoprazole (PROTONIX) 20 MG tablet TAKE 1 TABLET BY MOUTH EVERY DAY  . traMADol (ULTRAM) 50 MG tablet Take 1-2 tablets (50-100 mg total) by mouth at bedtime as needed.   Current Facility-Administered Medications for the 01/11/19 encounter (Office Visit) with [name], MD  Medication  . cefTRIAXone (ROCEPHIN) injection 1 g    Allergies: Patient is allergic to ace inhibitors; codeine; and flomax [tamsulosin hcl]. Family History: Patient family history includes Breast cancer in her mother; Diabetes in her maternal aunt and paternal grandmother; Heart disease in her mother; Renal Disease in her mother. Social History:  Patient  reports that she quit smoking about 22 years ago. Her smoking use included cigarettes. She has a 25.00 pack-year smoking history. She has never used smokeless tobacco. She reports current alcohol use. She reports that she does not use drugs.  Review of Systems: Constitutional: Negative for fever malaise or anorexia Cardiovascular: negative for chest pain Respiratory: negative for SOB or persistent cough Gastrointestinal: negative for abdominal pain  Objective  Vitals: BP 136/84   Pulse 81   Temp 98.4 F (36.9 C) (Oral)   Resp 16   Ht 5\' 6"  (1.676 m)   Wt 195 lb 14.4 oz (88.9 kg)   SpO2 97%   BMI 31.62 kg/m  General: Nontoxic appearing, A&Ox3 HEENT: PEERL, conjunctiva normal, right peri-tonsillar erythema and swelling, midline uvula, right-sided cervical lymphadenopathy present, no exudate, handling secretions fine Cardiovascular:  RRR without murmur or gallop.  Respiratory:  Good breath sounds bilaterally, CTAB with normal respiratory effort Skin:  Warm, no rashes  Office Visit on 01/11/2019  Component Date Value Ref Range Status  . Rapid Strep A Screen 01/11/2019 Negative  Negative Final      Commons side effects, risks, benefits, and alternatives for medications and  treatment plan prescribed today were discussed, and the patient expressed understanding of the given instructions. Patient is instructed to call or message via MyChart if he/she has any questions or concerns regarding our treatment plan. No barriers to understanding were identified. We discussed Red Flag symptoms and signs in detail. Patient expressed understanding regarding what to do in case of urgent or emergency type symptoms.   Medication list was reconciled, printed and provided to the patient in AVS. Patient instructions and summary information was reviewed with the patient as documented in the AVS. This note was prepared with assistance of Dragon voice recognition software. Occasional wrong-word or sound-a-like substitutions may have occurred due to the inherent limitations of voice recognition software

## 2019-01-11 NOTE — Patient Instructions (Signed)
Please monitor yourself for fevers or worsening throat pain or swelling in the back of the throat.  If you have any worsening symptoms, problems swallowing salive, problems breathing or can't keep hydrated, go directly to the emergency room.   Take the pain medications, start salt water gargles 3x/day, hydrate well, and complete the antibiotics.   You may return here on Monday for recheck.   If you have any questions or concerns, please don't hesitate to send me a message via MyChart or call the office at [phone]. Thank you for visiting with [place] today! It's our pleasure caring for you.   Peritonsillar Cellulitis  Peritonsillar cellulitis is an infection of the tissue around a tonsil that results in a severe sore throat. This infection can be treated with antibiotic medicine. If it is not treated, a collection of pus can form in the throat (peritonsillar abscess), which may require drainage. What are the causes? This condition is usually caused by a combination of several types of bacteria. You may get infected by:  Breathing in droplets from an infected person's cough or sneeze.  Touching something that has been exposed to the bacteria (has been contaminated) and then touching your mouth, nose, or eyes. What increases the risk? This condition is more likely to develop in people who:  Have frequent tonsil infections.  Have gum disease or a dental infection.  Smoke. What are the signs or symptoms? Early symptoms of this condition include:  Fever and chills.  Soreness on one side of the throat.  Pain in one ear.  Pain when swallowing.  Tiredness. As the infection gets worse, symptoms may include:  Severe pain when swallowing.  Drooling.  Trouble opening the mouth wide.  Bad breath.  Changes in how the voice sounds, such as hoarseness. How is this diagnosed? This condition may be diagnosed based on:  Your symptoms.  A physical exam.  Testing a fluid sample  from your throat (throat culture). This helps determine which types of bacteria are causing your infection.  A blood test. How is this treated? This condition is usually treated with antibiotic medicines. You may need to take these medicines by mouth or through an IV at the hospital. Treatment may also include:  Medicines for pain, fever, or swelling. Some of these medicines may be given through an IV.  Draining a peritonsillar abscess, if you have one. Your health care provider may drain the abscess by using a needle or by making an incision in the abscess.  Surgery to remove the tonsils (tonsillectomy). This may be done if you get peritonsillar cellulitis often. Follow these instructions at home: Eating and drinking      If it is difficult or painful to swallow, try only drinking liquids or only eating soft foods until you feel better.  Drink enough fluid to keep your urine pale yellow. Medicines  If you were prescribed an antibiotic to take at home, take it as told by your health care provider. Do not stop taking the antibiotic even if you start to feel better.  Take other over-the-counter and prescription medicines only as told by your health care provider. Your health care provider may recommend taking over-the-counter medicines to relieve pain, a fever, or swelling. Activity  Rest and get plenty of sleep.  Return to your normal activities, including school or work, as told by your health care provider. General instructions  Do not use any products that contain nicotine or tobacco, such as cigarettes and e-cigarettes. If you  need help quitting, ask your health care provider.  To help ease pain and swelling, gargle with a salt-water mixture 3-4 times a day or as needed. To make a salt-water mixture, completely dissolve -1 tsp of salt in 1 cup of warm water.  Keep all follow-up visits as told by your health care provider. This is important. Contact a health care provider  if:  You have pain or swelling that gets worse.  You develop difficulty swallowing.  You have a fever that does not improve after you take medicine.  Your voice changes.  You see pus around or near your tonsils. This may look like yellowish-white fluid. Get help right away if:  You have trouble breathing.  You have severe pain.  You cough up bloody spit.  You are unable to swallow.  You cannot stop drooling. Summary  Peritonsillar cellulitis is an infection of the tissue around a tonsil that results in a severe sore throat. This is treated with antibiotics. If the condition is not treated, a collection of pus can form in the throat (peritonsillar abscess), which may need to be drained.  This condition is usually treated with antibiotic medicines. You may need to take these medicines by mouth or through an IV at the hospital.  Rest and get plenty of sleep. Return to your normal activities as told by your health care provider. This information is not intended to replace advice given to you by your health care provider. Make sure you discuss any questions you have with your health care provider. Document Released: 02/02/2010 Document Revised: 08/09/2017 Document Reviewed: 08/09/2017 Elsevier Interactive Patient Education  2019 [employer].

## 2019-01-13 LAB — CULTURE, GROUP A STREP
MICRO NUMBER:: 224480
SPECIMEN QUALITY:: ADEQUATE

## 2019-01-18 ENCOUNTER — Ambulatory Visit: Payer: Medicare Other | Admitting: Family Medicine

## 2019-02-22 ENCOUNTER — Ambulatory Visit (INDEPENDENT_AMBULATORY_CARE_PROVIDER_SITE_OTHER): Payer: Medicare Other | Admitting: Family Medicine

## 2019-02-22 ENCOUNTER — Ambulatory Visit: Payer: Medicare Other | Admitting: Family Medicine

## 2019-02-22 ENCOUNTER — Other Ambulatory Visit: Payer: Self-pay

## 2019-02-22 ENCOUNTER — Encounter: Payer: Self-pay | Admitting: Family Medicine

## 2019-02-22 VITALS — BP 116/66 | HR 96 | Resp 16 | Wt 192.3 lb

## 2019-02-22 DIAGNOSIS — I10 Essential (primary) hypertension: Secondary | ICD-10-CM

## 2019-02-22 DIAGNOSIS — Z5329 Procedure and treatment not carried out because of patient's decision for other reasons: Secondary | ICD-10-CM | POA: Diagnosis not present

## 2019-02-22 DIAGNOSIS — E669 Obesity, unspecified: Secondary | ICD-10-CM | POA: Diagnosis not present

## 2019-02-22 DIAGNOSIS — Z5320 Procedure and treatment not carried out because of patient's decision for unspecified reasons: Secondary | ICD-10-CM

## 2019-02-22 DIAGNOSIS — E1142 Type 2 diabetes mellitus with diabetic polyneuropathy: Secondary | ICD-10-CM

## 2019-02-22 MED ORDER — TRAMADOL HCL ER 200 MG PO TB24: 200.0000 mg | ORAL_TABLET | Freq: Every day | ORAL | 5 refills | Status: DC

## 2019-02-22 NOTE — Progress Notes (Signed)
Virtual Visit via Video Note  Subjective  CC:  Chief Complaint  Patient presents with  . Follow-up  . Diabetes  . Hypertension  . Obesity  . Hyperlipidemia    HPI:  I connected with Marilyn Myers on 02/22/19 at 10:30 AM EDT by a video enabled telemedicine application and verified that I am speaking with the correct person using two identifiers. Location patient: Home Location provider: [facility], Office Persons participating in the virtual visit: Marilyn Myers, [name], MD Lilli Light, [place] discussed the limitations of evaluation and management by telemedicine and the availability of in person appointments. The patient expressed understanding and agreed to proceed. . Peripheral neuropathy on nightly tramadol 50-100 and high dose gabapentin: worsening some at night, maybe due to not being as active. Denies lower ext rash or sores or swelling. Rare low back pain. Took 150mg  tramadol last night and it helped. But sleep was different: she thinks due to tramadol and gabapentin together. UDS and controlled substance agreement in place. Reviewed PDMP reviewed.  [name] Kitchen HTN: stable. Well controlled. Checked in front of [place] today with home cuff. Feeling well. Taking medications w/o adverse effects. No symptoms of CHF, angina; no palpitations, sob, cp or lower extremity edema. Compliant with meds.  . Obesity: continues to do intermittent fasting. Never took belviq due to perceived risks. Weight is down a few more pounds . HLD on zetia; refuses statin. No AEs  Diabetes follow up: Her diabetic control is reported as Unchanged. Has been well controlled. Denies sxs of hyperglycemia.  She denies exertional CP or SOB or symptomatic hypoglycemia. She denies foot sores.   Immunization History  Administered Date(s) Administered  . Influenza, High Dose Seasonal PF 08/19/2014, 09/02/2015, 10/08/2016, 09/22/2017, 08/29/2018  . Pneumococcal Conjugate-13 10/18/2016  .  Pneumococcal Polysaccharide-23 08/19/2014    Diabetes Related Lab Review: Lab Results  Component Value Date   HGBA1C 6.5 (A) 11/28/2018   No results found for: Derl Barrow Lab Results  Component Value Date   CREATININE 1.1 11/06/2018   BUN 18 11/06/2018   NA 139 11/06/2018   K 3.6 11/06/2018   CL 98 07/26/2014   CO2 21 07/26/2014   Lab Results  Component Value Date   CHOL 184 11/06/2018   Lab Results  Component Value Date   HDL 40 11/06/2018   Lab Results  Component Value Date   LDLCALC 87 11/06/2018   Lab Results  Component Value Date   TRIG 284 (A) 11/06/2018   No results found for: CHOLHDL No results found for: LDLDIRECT The 10-year ASCVD risk score Mikey Bussing DC Jr., et al., 2013) is: 18.2%*   Values used to calculate the score:     Age: 68 years     Sex: Female     Is Non-Hispanic African American: No     Diabetic: Yes     Tobacco smoker: No     Systolic Blood Pressure: 476 mmHg     Is BP treated: Yes     HDL Cholesterol: 40 mg/dL*     Total Cholesterol: 184 mg/dL     * - Cholesterol units were assumed for this score calculation Wt Readings from Last 3 Encounters:  02/22/19 192 lb 4.8 oz (87.2 kg)  01/11/19 195 lb 14.4 oz (88.9 kg)  12/07/18 203 lb (92.1 kg)    Assessment  1. Controlled type 2 diabetes mellitus with diabetic polyneuropathy, without long-term current use of insulin ([place])  2. Essential hypertension   3. Refusal of statin medication by patient   4. Diabetic peripheral neuropathy ([facility])   5. Obesity (BMI 30-39.9)      Plan   DM:  Clinically well controlled. Continue current meds. Recheck 3 months.   HTN: This medical condition is well controlled. There are no signs of complications, medication side effects, or red flags. Patient is instructed to continue the current treatment plan without change in therapies or medications.   Peripheral neuropathy: change to ultram ER 200mg  nightly and hold nighttime dose of gabapentin to  avoid interactions / sleep side effects.   Obesity: continue current eating patterns and monitoring weight.   HLD remains on zetia with only fair control.   PDMP reviewed and was appropriate.  I discussed the assessment and treatment plan with the patient. The patient was provided an opportunity to ask questions and all were answered. The patient agreed with the plan and demonstrated an understanding of the instructions.   The patient was advised to call back or seek an in-person evaluation if the symptoms worsen or if the condition fails to improve as anticipated. Follow up: Return in about 3 months (around 05/24/2019) for follow up of diabetes and hypertension and recheck pain control.  Visit date not found  Meds ordered this encounter  Medications  . traMADol (ULTRAM ER) 200 MG 24 hr tablet    Sig: Take 1 tablet (200 mg total) by mouth daily.    Dispense:  30 tablet    Refill:  5      I reviewed the patients updated PMH, FH, and SocHx.    Patient Active Problem List   Diagnosis Date Noted  . Type 2 diabetes mellitus, uncontrolled, with neuropathy ([place]) 01/28/2015    Priority: High  . Obesity (BMI 30-39.9) 02/22/2019  . Refusal of statin medication by patient 01/24/2018  . Seasonal allergic rhinitis 01/24/2018  . Moderate protein-calorie malnutrition ([place]) 12/03/2016  . PVC's (premature ventricular contractions) 10/08/2016  . Diabetic peripheral neuropathy ([place]) 09/28/2016  . S/P colostomy takedown 07/02/2015  . Osteopenia 05/19/2015  . Essential hypertension 01/28/2015  . Diverticulitis of large intestine 08/19/2014  . Hydradenitis 08/19/2014  . Nephrolithiasis 08/19/2014  . Obstructive sleep apnea 08/01/2014  . Controlled type 2 diabetes mellitus with diabetic polyneuropathy, without long-term current use of insulin ([place]) 06/10/2014   Current Meds  Medication Sig  . Biotin 5000 MCG CAPS Take by mouth.  . Calcium Carbonate-Vitamin D (CALCIUM-VITAMIN D3 PO) Take by  mouth.  . Cetirizine HCl (ZYRTEC ALLERGY PO) Take by mouth.  . Cholecalciferol (VITAMIN D3) 1000 UNITS CAPS Take 1 capsule by mouth daily.  . Cyanocobalamin (VITAMIN B 12 PO) Take by mouth.  . diltiazem (CARDIZEM CD) 120 MG 24 hr capsule TAKE ONE CAPSULE BY MOUTH EVERY DAY  . FA-B6-B12-Omega 3-Phytosterols (BP VIT 3 PO) Take by mouth.  . FIBER ADULT GUMMIES 2 g CHEW Chew by mouth.  . FOLIC ACID PO Take by mouth.  . gabapentin (NEURONTIN) 600 MG tablet TAKE 1 TABLET 4 TIMES A DAY  . hydrochlorothiazide (HYDRODIURIL) 25 MG tablet Take 1 tablet (25 mg total) by mouth daily.  [name] Kitchen losartan (COZAAR) 100 MG tablet Take 1 tablet (100 mg total) by mouth daily.  . metFORMIN (GLUCOPHAGE) 1000 MG tablet TAKE 1 TABLET BY MOUTH TWICE A DAY  . Multiple Vitamin (MULTIVITAMIN) capsule Take by mouth.  . [DISCONTINUED] traMADol (ULTRAM) 50 MG tablet Take 1-2 tablets (50-100 mg total) by mouth at bedtime as needed.  Allergies: Patient is allergic to ace inhibitors; codeine; and flomax [tamsulosin hcl]. Family History: Patient family history includes Breast cancer in her mother; Diabetes in her maternal aunt and paternal grandmother; Heart disease in her mother; Renal Disease in her mother. Social History:  Patient  reports that she quit smoking about 22 years ago. Her smoking use included cigarettes. She has a 25.00 pack-year smoking history. She has never used smokeless tobacco. She reports current alcohol use. She reports that she does not use drugs.  OBJECTIVE Vitals: BP 116/66   Pulse 96   Resp 16   Wt 192 lb 4.8 oz (87.2 kg)   BMI 31.04 kg/m  General: no acute distress , A&Ox3 Appears well. Normal mood [name], MD

## 2019-02-22 NOTE — Patient Instructions (Signed)
Please return in 3 months for recheck diabetes and blood pressure and peripheral neuropathy.   If you have any questions or concerns, please don't hesitate to send me a message via MyChart or call the office at [phone]. Thank you for visiting with [place] today! It's our pleasure caring for you.

## 2019-02-22 NOTE — Progress Notes (Signed)
I have discussed the procedure for the virtual visit with the patient who has given consent to proceed with assessment and treatment.   Marilyn Myers, CMA     

## 2019-02-27 ENCOUNTER — Telehealth: Payer: Self-pay | Admitting: Family Medicine

## 2019-02-27 MED ORDER — TRAMADOL HCL 100 MG PO TABS: 100.0000 mg | ORAL_TABLET | Freq: Two times a day (BID) | ORAL | 5 refills | Status: DC | PRN

## 2019-02-27 NOTE — Telephone Encounter (Signed)
Please advise 

## 2019-02-27 NOTE — Telephone Encounter (Signed)
Patient called regarding her tramadol being to expensive.  She asked that the rx be changed to tramadol 200mg , 30day is cheaper, and sent to [facility] to [address].

## 2019-02-28 ENCOUNTER — Telehealth: Payer: Self-pay | Admitting: Family Medicine

## 2019-02-28 ENCOUNTER — Encounter: Payer: Self-pay | Admitting: *Deleted

## 2019-02-28 MED ORDER — TRAMADOL HCL ER 200 MG PO TB24: 200.0000 mg | ORAL_TABLET | Freq: Every day | ORAL | 5 refills | Status: DC

## 2019-02-28 NOTE — Telephone Encounter (Signed)
This encounter was created in error - please disregard.

## 2019-02-28 NOTE — Telephone Encounter (Signed)
Telephone call to clarify; Discussed options.  Pt elects 200mg  ultram ER nightly. Sent to [facility].

## 2019-02-28 NOTE — Addendum Note (Signed)
Addended by: Billey Chang on: 02/28/2019 11:31 AM   Modules accepted: Orders

## 2019-02-28 NOTE — Telephone Encounter (Signed)
Pt called to change her pharmacy for the Tramadol 100 mg tab for cost reasons, from [id number].  She would like to use [id number], [place]. They have the 200 mg  Tab and she would be willing to cut it in half.

## 2019-02-28 NOTE — Telephone Encounter (Signed)
fyi

## 2019-03-01 ENCOUNTER — Ambulatory Visit: Payer: Medicare Other | Admitting: Family Medicine

## 2019-05-07 ENCOUNTER — Telehealth: Payer: Self-pay | Admitting: Physical Therapy

## 2019-05-07 NOTE — Telephone Encounter (Signed)
Copied from [place] [id number]. Topic: Appointment Scheduling - Scheduling Inquiry for Clinic >> May 07, 2019  2:59 PM Alanda Slim E wrote: Reason for CRM: Pt would like to schedule a follow up appt with Dr. Jonni Sanger / please advise

## 2019-05-24 ENCOUNTER — Encounter: Payer: Self-pay | Admitting: Family Medicine

## 2019-05-24 ENCOUNTER — Other Ambulatory Visit: Payer: Self-pay

## 2019-05-24 ENCOUNTER — Ambulatory Visit (INDEPENDENT_AMBULATORY_CARE_PROVIDER_SITE_OTHER): Payer: Medicare Other | Admitting: Family Medicine

## 2019-05-24 VITALS — BP 120/72 | HR 57 | Temp 97.8°F | Resp 16 | Wt 192.4 lb

## 2019-05-24 DIAGNOSIS — D3614 Benign neoplasm of peripheral nerves and autonomic nervous system of thorax: Secondary | ICD-10-CM | POA: Diagnosis not present

## 2019-05-24 DIAGNOSIS — M6740 Ganglion, unspecified site: Secondary | ICD-10-CM | POA: Diagnosis not present

## 2019-05-24 DIAGNOSIS — E1142 Type 2 diabetes mellitus with diabetic polyneuropathy: Secondary | ICD-10-CM | POA: Diagnosis not present

## 2019-05-24 DIAGNOSIS — G4700 Insomnia, unspecified: Secondary | ICD-10-CM | POA: Diagnosis not present

## 2019-05-24 DIAGNOSIS — I10 Essential (primary) hypertension: Secondary | ICD-10-CM

## 2019-05-24 DIAGNOSIS — E1140 Type 2 diabetes mellitus with diabetic neuropathy, unspecified: Secondary | ICD-10-CM

## 2019-05-24 DIAGNOSIS — D485 Neoplasm of uncertain behavior of skin: Secondary | ICD-10-CM | POA: Diagnosis not present

## 2019-05-24 DIAGNOSIS — M67471 Ganglion, right ankle and foot: Secondary | ICD-10-CM | POA: Diagnosis not present

## 2019-05-24 DIAGNOSIS — G4709 Other insomnia: Secondary | ICD-10-CM

## 2019-05-24 DIAGNOSIS — L720 Epidermal cyst: Secondary | ICD-10-CM | POA: Diagnosis not present

## 2019-05-24 DIAGNOSIS — D3617 Benign neoplasm of peripheral nerves and autonomic nervous system of trunk, unspecified: Secondary | ICD-10-CM | POA: Diagnosis not present

## 2019-05-24 LAB — POCT GLYCOSYLATED HEMOGLOBIN (HGB A1C): Hemoglobin A1C: 6.1 % — AB (ref 4.0–5.6)

## 2019-05-24 MED ORDER — HYDROCODONE-ACETAMINOPHEN 5-325 MG PO TABS: 1.0000 | ORAL_TABLET | Freq: Every day | ORAL | 0 refills | Status: DC

## 2019-05-24 NOTE — Patient Instructions (Signed)
Please return in 3 months to recheck neuropathy pain and sleep.   Diabetes looks great.  Please call to schedule your mammogram. Medicare covers this annually.   I have Rxd hydrocodone for the pain. Take one nightly. If it does well, call me for a refill next month. If it doesn't, then I will refer you to pain management.  Stop the ultram and ambien.   If you have any questions or concerns, please don't hesitate to send me a message via MyChart or call the office at [phone]. Thank you for visiting with [place] today! It's our pleasure caring for you.

## 2019-05-24 NOTE — Progress Notes (Signed)
Subjective  CC:  Chief Complaint  Patient presents with  . Diabetes  . Hypertension  . Insomnia    Taking 3 tramadol 50mg  at night with 1 ambien and reports working well    HPI: Marilyn Myers is a 68 y.o. female who presents to the office today for follow up of diabetes, hypertension and problems listed above in the chief complaint.   Diabetic f/u: Her diabetic control is reported as Unchanged. Feels well. Has been well controlled. Due eye exam in July.  She denies exertional CP or SOB or symptomatic hypoglycemia. She denies foot sores.    Hypertension f/u: Control is good . Pt reports she is doing well. taking medications as instructed, no medication side effects noted, no TIAs, no chest pain on exertion, no dyspnea on exertion, no swelling of ankles.  She denies adverse effects from his BP medications. Compliance with medication is good.    Neuropathy pain: continues to struggle. Pain interferes with sleep. Ultram ER was too expensive. Now taking ultram 150mg  with ambien 5mg  at night to get to sleep.    Assessment  1. Type 2 diabetes, controlled, with neuropathy ([place])   2. Essential hypertension   3. Diabetic peripheral neuropathy ([place])   4. Secondary insomnia      Plan   Diabetes is currently very well controlled. Continue same meds.   Hypertension is currently well controlled. Continue same meds  Peripheral neuropathy uncontrolled: again had discussion regarding tx options. Can't continue ambien with 150mg  ultram which is above recommended dosing. Offered neuro, pain mgt or trial of one nightly norco. We will see if norco nightly will control pain and allow for sleep. Continue high dose gabapentin. If not improving, needs neuro or pain mgt. She understands and agrees with careplan.   Diabetic education: ongoing education regarding chronic disease management for diabetes was given today. We continue to reinforce the ABC's of diabetic management: A1c (<7 or 8 dependent  upon patient), tight blood pressure control, and cholesterol management with goal LDL < 100 minimally. We discuss diet strategies, exercise recommendations, medication options and possible side effects. At each visit, we review recommended immunizations and preventive care recommendations for diabetics and stress that good diabetic control can prevent other problems. See below for this patient's data. Hypertension education: ongoing education regarding management of these chronic disease states was given. Management strategies discussed on successive visits include dietary and exercise recommendations, goals of achieving and maintaining IBW, and lifestyle modifications aiming for adequate sleep and minimizing stressors.   Follow up: 38months recheck pain and sleep..  Orders Placed This Encounter  Procedures  . Microalbumin / creatinine urine ratio  . POCT glycosylated hemoglobin (Hb A1C)   Meds ordered this encounter  Medications  . HYDROcodone-acetaminophen (NORCO) 5-325 MG tablet    Sig: Take 1 tablet by mouth at bedtime.    Dispense:  30 tablet    Refill:  0      I reviewed the patients updated PMH, FH, and SocHx.  Patient Active Problem List   Diagnosis Date Noted  . Type 2 diabetes mellitus, uncontrolled, with neuropathy ([place]) 01/28/2015    Priority: High  . Obesity (BMI 30-39.9) 02/22/2019  . Refusal of statin medication by patient 01/24/2018  . Seasonal allergic rhinitis 01/24/2018  . Moderate protein-calorie malnutrition ([place]) 12/03/2016  . PVC's (premature ventricular contractions) 10/08/2016  . Diabetic peripheral neuropathy ([place]) 09/28/2016  . S/P colostomy takedown 07/02/2015  . Osteopenia 05/19/2015  . Essential hypertension 01/28/2015  .  Diverticulitis of large intestine 08/19/2014  . Hydradenitis 08/19/2014  . Nephrolithiasis 08/19/2014  . Obstructive sleep apnea 08/01/2014  . Controlled type 2 diabetes mellitus with diabetic polyneuropathy, without long-term  current use of insulin ([place]) 06/10/2014   Immunization History  Administered Date(s) Administered  . Influenza, High Dose Seasonal PF 08/19/2014, 09/02/2015, 10/08/2016, 09/22/2017, 08/29/2018  . Pneumococcal Conjugate-13 10/18/2016  . Pneumococcal Polysaccharide-23 08/19/2014   Health Maintenance  Topic Date Due  . Hepatitis C Screening  12/08/2019 (Originally [birth year])  . HEMOGLOBIN A1C  05/29/2019  . MAMMOGRAM  05/31/2019  . INFLUENZA VACCINE  06/23/2019  . OPHTHALMOLOGY EXAM  06/29/2019  . FOOT EXAM  09/20/2019  . DEXA SCAN  07/04/2021  . COLONOSCOPY  06/28/2028  . PNA vac Low Risk Adult  Completed   Diabetes and HTN Related Lab Review: Lab Results  Component Value Date   HGBA1C 6.1 (A) 05/24/2019   HGBA1C 6.5 (A) 11/28/2018   HGBA1C 6.4 11/06/2018    No results found for: Derl Barrow Lab Results  Component Value Date   CREATININE 1.1 11/06/2018   BUN 18 11/06/2018   NA 139 11/06/2018   K 3.6 11/06/2018   CL 98 07/26/2014   CO2 21 07/26/2014   Lab Results  Component Value Date   CHOL 184 11/06/2018   Lab Results  Component Value Date   HDL 40 11/06/2018   Lab Results  Component Value Date   LDLCALC 87 11/06/2018   Lab Results  Component Value Date   TRIG 284 (A) 11/06/2018   No results found for: CHOLHDL No results found for: LDLDIRECT The 10-year ASCVD risk score Mikey Bussing DC Jr., et al., 2013) is: 21.2%*   Values used to calculate the score:     Age: 27 years     Sex: Female     Is Non-Hispanic African American: No     Diabetic: Yes     Tobacco smoker: No     Systolic Blood Pressure: 259 mmHg     Is BP treated: Yes     HDL Cholesterol: 40 mg/dL*     Total Cholesterol: 184 mg/dL     * - Cholesterol units were assumed for this score calculation  BP Readings from Last 3 Encounters:  05/24/19 120/72  02/22/19 116/66  01/11/19 136/84   Wt Readings from Last 3 Encounters:  05/24/19 192 lb 6.4 oz (87.3 kg)  02/22/19 192 lb 4.8 oz (87.2  kg)  01/11/19 195 lb 14.4 oz (88.9 kg)    Allergies: Patient is allergic to ace inhibitors; codeine; and flomax [tamsulosin hcl]. Family History: Patient family history includes Breast cancer in her mother; Diabetes in her maternal aunt and paternal grandmother; Heart disease in her mother; Renal Disease in her mother. Social History:  Patient  reports that she quit smoking about 22 years ago. Her smoking use included cigarettes. She has a 25.00 pack-year smoking history. She has never used smokeless tobacco. She reports current alcohol use. She reports that she does not use drugs.  Review of Systems: Ophthalmic: negative for eye pain, loss of vision or double vision Cardiovascular: negative for chest pain Respiratory: negative for SOB or persistent cough Gastrointestinal: negative for abdominal pain Genitourinary: negative for dysuria or gross hematuria MSK: negative for foot lesions Neurologic: negative for weakness or gait disturbance Current Meds  Medication Sig  . Biotin 5000 MCG CAPS Take by mouth.  . Calcium Carbonate-Vitamin D (CALCIUM-VITAMIN D3 PO) Take by mouth.  . Cetirizine HCl (ZYRTEC ALLERGY PO) Take  by mouth.  . Cholecalciferol (VITAMIN D3) 1000 UNITS CAPS Take 1 capsule by mouth daily.  . Cyanocobalamin (VITAMIN B 12 PO) Take by mouth.  . diltiazem (CARDIZEM CD) 120 MG 24 hr capsule TAKE ONE CAPSULE BY MOUTH EVERY DAY  . FA-B6-B12-Omega 3-Phytosterols (BP VIT 3 PO) Take by mouth.  . FIBER ADULT GUMMIES 2 g CHEW Chew by mouth.  . gabapentin (NEURONTIN) 600 MG tablet TAKE 1 TABLET 4 TIMES A DAY  . hydrochlorothiazide (HYDRODIURIL) 25 MG tablet Take 1 tablet (25 mg total) by mouth daily.  [name] Kitchen losartan (COZAAR) 100 MG tablet Take 1 tablet (100 mg total) by mouth daily.  . metFORMIN (GLUCOPHAGE) 1000 MG tablet TAKE 1 TABLET BY MOUTH TWICE A DAY  . Multiple Vitamin (MULTIVITAMIN) capsule Take by mouth.  . pantoprazole (PROTONIX) 20 MG tablet TAKE 1 TABLET BY MOUTH EVERY DAY   . [DISCONTINUED] traMADol (ULTRAM ER) 200 MG 24 hr tablet Take 1 tablet (200 mg total) by mouth at bedtime.  . [DISCONTINUED] traMADol (ULTRAM) 50 MG tablet Take 50 mg by mouth at bedtime as needed. 3 tabs at bedtime  . [DISCONTINUED] zolpidem (AMBIEN) 5 MG tablet Take 5 mg by mouth at bedtime as needed for sleep.    Objective  Vitals: BP 120/72   Pulse (!) 57   Temp 97.8 F (36.6 C) (Oral)   Resp 16   Wt 192 lb 6.4 oz (87.3 kg)   SpO2 97%   BMI 31.05 kg/m  General: well appearing, no acute distress  Psych:  Alert and oriented, normal mood and affect HEENT:  Normocephalic, atraumatic, moist mucous membranes, supple neck  Cardiovascular:  Nl S1 and S2, RRR without murmur, gallop or rub. no edema Respiratory:  Good breath sounds bilaterally, CTAB with normal effort, no rales Gastrointestinal: normal BS, soft, nontender Skin:  Warm, no rashes Neurologic:   Mental status is normal. normal gait Foot exam: no erythema, pallor, or cyanosis visible nl proprioception and sensation to monofilament testing bilaterally, +2 distal pulses bilaterally   Commons side effects, risks, benefits, and alternatives for medications and treatment plan prescribed today were discussed, and the patient expressed understanding of the given instructions. Patient is instructed to call or message via MyChart if he/she has any questions or concerns regarding our treatment plan. No barriers to understanding were identified. We discussed Red Flag symptoms and signs in detail. Patient expressed understanding regarding what to do in case of urgent or emergency type symptoms.   Medication list was reconciled, printed and provided to the patient in AVS. Patient instructions and summary information was reviewed with the patient as documented in the AVS. This note was prepared with assistance of Dragon voice recognition software. Occasional wrong-word or sound-a-like substitutions may have occurred due to the inherent  limitations of voice recognition software

## 2019-05-26 ENCOUNTER — Other Ambulatory Visit: Payer: Self-pay | Admitting: Family Medicine

## 2019-06-03 ENCOUNTER — Other Ambulatory Visit: Payer: Self-pay | Admitting: Family Medicine

## 2019-06-22 ENCOUNTER — Telehealth: Payer: Self-pay | Admitting: Family Medicine

## 2019-06-22 DIAGNOSIS — E1142 Type 2 diabetes mellitus with diabetic polyneuropathy: Secondary | ICD-10-CM

## 2019-06-22 MED ORDER — HYDROCODONE-ACETAMINOPHEN 5-325 MG PO TABS: 1.0000 | ORAL_TABLET | Freq: Every day | ORAL | 0 refills | Status: DC

## 2019-06-22 NOTE — Telephone Encounter (Signed)
Please call pt. Gabapentin can be 600mg  qid max. Can not increase it further. She is taking too much.  Will refill norco #20  to be used sparingly. She was given 30 earlier this month. If needs nightly dosing then needs pain mgt or neurology evaluation. She can be referred. I will not refill norco > 30 every 6 months.   Thanks.

## 2019-06-22 NOTE — Telephone Encounter (Signed)
See note

## 2019-06-22 NOTE — Addendum Note (Signed)
Addended by: Layla Barter on: 06/22/2019 01:44 PM   Modules accepted: Orders

## 2019-06-22 NOTE — Telephone Encounter (Signed)
Pt returned missed call from practice. FC line rang busy x3. Please advise.

## 2019-06-22 NOTE — Telephone Encounter (Signed)
Please advise 

## 2019-06-22 NOTE — Telephone Encounter (Signed)
Pt aware and agrees to pain management

## 2019-06-22 NOTE — Telephone Encounter (Signed)
Did you call pt?

## 2019-06-22 NOTE — Addendum Note (Signed)
Addended by: Billey Chang on: 06/22/2019 01:38 PM   Modules accepted: Orders

## 2019-06-22 NOTE — Telephone Encounter (Signed)
Patient is requesting a medication refill for:   - HYDROcodone-acetaminophen (NORCO) 5-325 MG tablet  - this was a 30 day supply last time   - pt has about 2 pills left   -[id number] - [place], [place] - [address] AT [address]; Patient wanted me to request that dr. Jonni Sanger increase her  gabapentin (NEURONTIN) 600 MG tablet  Pt says she is taking taking around 1 every 3 hours, about 1200 mg more than she was. She stops taking it at 6pm and then takes her hydrocodone at 9 at night and then takes a gabapentin again at 3am.   I let patient know we would be giving her a call back to schedule an appt if needed.

## 2019-06-26 NOTE — Telephone Encounter (Signed)
Please see below.

## 2019-06-26 NOTE — Telephone Encounter (Signed)
See note

## 2019-06-26 NOTE — Telephone Encounter (Signed)
Pt stated she want to go to [facility] on [address]. Please advise

## 2019-06-29 NOTE — Telephone Encounter (Signed)
Pt called to check status of new referral to [facility] [address]. Please advise.

## 2019-07-02 NOTE — Telephone Encounter (Signed)
I had to change it because it was not noted on the referral.  They have to review her chart.  If someone in the office can call her, I would really appreciate it.  I am at [facility] again today.

## 2019-07-03 ENCOUNTER — Other Ambulatory Visit: Payer: Self-pay | Admitting: *Deleted

## 2019-07-03 MED ORDER — GABAPENTIN 600 MG PO TABS: ORAL_TABLET | ORAL | 1 refills | Status: DC

## 2019-07-04 NOTE — Telephone Encounter (Signed)
Good Morning, I was out sick Monday and Tuesday and am just seeing this. Colletta [place] can you please update note and follow up with the patient if Amber has not already done so.

## 2019-07-05 DIAGNOSIS — L820 Inflamed seborrheic keratosis: Secondary | ICD-10-CM | POA: Diagnosis not present

## 2019-07-05 DIAGNOSIS — L308 Other specified dermatitis: Secondary | ICD-10-CM | POA: Diagnosis not present

## 2019-07-05 DIAGNOSIS — L821 Other seborrheic keratosis: Secondary | ICD-10-CM | POA: Diagnosis not present

## 2019-07-09 ENCOUNTER — Other Ambulatory Visit: Payer: Self-pay | Admitting: *Deleted

## 2019-07-09 MED ORDER — METFORMIN HCL 1000 MG PO TABS: 1000.0000 mg | ORAL_TABLET | Freq: Two times a day (BID) | ORAL | 3 refills | Status: DC

## 2019-07-12 ENCOUNTER — Telehealth: Payer: Self-pay | Admitting: Family Medicine

## 2019-07-12 MED ORDER — HYDROCODONE-ACETAMINOPHEN 5-325 MG PO TABS: 1.0000 | ORAL_TABLET | Freq: Every day | ORAL | 0 refills | Status: DC

## 2019-07-12 NOTE — Telephone Encounter (Signed)
Refill short term only.  Pt needs to keep appt with pain mgt.

## 2019-07-12 NOTE — Telephone Encounter (Signed)
Please advise 

## 2019-07-12 NOTE — Telephone Encounter (Signed)
Pt cannot get into the pain clinic for 2 months.  She would like a refill of the hydrocodone - she only has 3 pills left.  She said that if you cannot refill the hydrocodone, she will take Tramadol.  Pain has gotten worse and she is not able to sleep. She would like a call back to discuss her medication options.

## 2019-07-12 NOTE — Telephone Encounter (Signed)
Spoke with pt, she is aware

## 2019-07-12 NOTE — Addendum Note (Signed)
Addended by: Billey Chang on: 07/12/2019 03:20 PM   Modules accepted: Orders

## 2019-07-31 DIAGNOSIS — Z79891 Long term (current) use of opiate analgesic: Secondary | ICD-10-CM | POA: Diagnosis not present

## 2019-07-31 DIAGNOSIS — G894 Chronic pain syndrome: Secondary | ICD-10-CM | POA: Diagnosis not present

## 2019-07-31 DIAGNOSIS — M792 Neuralgia and neuritis, unspecified: Secondary | ICD-10-CM | POA: Diagnosis not present

## 2019-07-31 DIAGNOSIS — G629 Polyneuropathy, unspecified: Secondary | ICD-10-CM | POA: Diagnosis not present

## 2019-07-31 DIAGNOSIS — Z79899 Other long term (current) drug therapy: Secondary | ICD-10-CM | POA: Diagnosis not present

## 2019-08-07 DIAGNOSIS — R200 Anesthesia of skin: Secondary | ICD-10-CM | POA: Diagnosis not present

## 2019-08-24 ENCOUNTER — Ambulatory Visit: Payer: Medicare Other | Admitting: Family Medicine

## 2019-08-27 ENCOUNTER — Encounter: Payer: Self-pay | Admitting: Family Medicine

## 2019-08-27 ENCOUNTER — Other Ambulatory Visit: Payer: Self-pay

## 2019-08-27 ENCOUNTER — Ambulatory Visit (INDEPENDENT_AMBULATORY_CARE_PROVIDER_SITE_OTHER): Payer: Medicare Other | Admitting: Family Medicine

## 2019-08-27 VITALS — BP 120/82 | HR 71 | Temp 97.2°F | Resp 16 | Ht 66.0 in | Wt 202.0 lb

## 2019-08-27 DIAGNOSIS — Z5320 Procedure and treatment not carried out because of patient's decision for unspecified reasons: Secondary | ICD-10-CM

## 2019-08-27 DIAGNOSIS — Z23 Encounter for immunization: Secondary | ICD-10-CM

## 2019-08-27 DIAGNOSIS — E1142 Type 2 diabetes mellitus with diabetic polyneuropathy: Secondary | ICD-10-CM

## 2019-08-27 DIAGNOSIS — I10 Essential (primary) hypertension: Secondary | ICD-10-CM

## 2019-08-27 DIAGNOSIS — E669 Obesity, unspecified: Secondary | ICD-10-CM | POA: Diagnosis not present

## 2019-08-27 DIAGNOSIS — G4733 Obstructive sleep apnea (adult) (pediatric): Secondary | ICD-10-CM

## 2019-08-27 DIAGNOSIS — Z5329 Procedure and treatment not carried out because of patient's decision for other reasons: Secondary | ICD-10-CM

## 2019-08-27 LAB — POCT GLYCOSYLATED HEMOGLOBIN (HGB A1C): Hemoglobin A1C: 6.1 % — AB (ref 4.0–5.6)

## 2019-08-27 LAB — LIPID PANEL
Cholesterol: 200 mg/dL (ref 0–200)
HDL: 41.3 mg/dL (ref >39.00)
NonHDL: 158.51
Total CHOL/HDL Ratio: 5
Triglycerides: 392 mg/dL — ABNORMAL HIGH (ref 0.0–149.0)
VLDL: 78.4 mg/dL — ABNORMAL HIGH (ref 0.0–40.0)

## 2019-08-27 LAB — CBC WITH DIFFERENTIAL/PLATELET
Basophils Absolute: 0.1 10*3/uL (ref 0.0–0.1)
Basophils Relative: 0.6 % (ref 0.0–3.0)
Eosinophils Absolute: 0.6 10*3/uL (ref 0.0–0.7)
Eosinophils Relative: 5.9 % — ABNORMAL HIGH (ref 0.0–5.0)
HCT: 37.5 % (ref 36.0–46.0)
Hemoglobin: 12.3 g/dL (ref 12.0–15.0)
Lymphocytes Relative: 23.4 % (ref 12.0–46.0)
Lymphs Abs: 2.2 10*3/uL (ref 0.7–4.0)
MCHC: 33 g/dL (ref 30.0–36.0)
MCV: 93.1 fl (ref 78.0–100.0)
Monocytes Absolute: 0.9 10*3/uL (ref 0.1–1.0)
Monocytes Relative: 9.4 % (ref 3.0–12.0)
Neutro Abs: 5.7 10*3/uL (ref 1.4–7.7)
Neutrophils Relative %: 60.7 % (ref 43.0–77.0)
Platelets: 266 10*3/uL (ref 150.0–400.0)
RBC: 4.02 Mil/uL (ref 3.87–5.11)
RDW: 13.6 % (ref 11.5–15.5)
WBC: 9.4 10*3/uL (ref 4.0–10.5)

## 2019-08-27 LAB — TSH: TSH: 1.91 u[IU]/mL (ref 0.35–4.50)

## 2019-08-27 LAB — COMPREHENSIVE METABOLIC PANEL
ALT: 13 U/L (ref 0–35)
AST: 12 U/L (ref 0–37)
Albumin: 3.9 g/dL (ref 3.5–5.2)
Alkaline Phosphatase: 74 U/L (ref 39–117)
BUN: 23 mg/dL (ref 6–23)
CO2: 31 mEq/L (ref 19–32)
Calcium: 9.7 mg/dL (ref 8.4–10.5)
Chloride: 98 mEq/L (ref 96–112)
Creatinine, Ser: 1.06 mg/dL (ref 0.40–1.20)
GFR: 51.2 mL/min — ABNORMAL LOW (ref >60.00)
Glucose, Bld: 120 mg/dL — ABNORMAL HIGH (ref 70–99)
Potassium: 3.7 mEq/L (ref 3.5–5.1)
Sodium: 137 mEq/L (ref 135–145)
Total Bilirubin: 0.8 mg/dL (ref 0.2–1.2)
Total Protein: 6.8 g/dL (ref 6.0–8.3)

## 2019-08-27 LAB — LDL CHOLESTEROL, DIRECT: Direct LDL: 115 mg/dL

## 2019-08-27 NOTE — Progress Notes (Signed)
Subjective  CC:  Chief Complaint  Patient presents with  . Diabetes  . Hypertension  . Peripheral Neuropathy    Going to pain Clinic now    HPI: Marilyn Myers is a 68 y.o. female who presents to the office today for follow up of diabetes and problems listed above in the chief complaint.   Diabetes follow up: Her diabetic control is reported as Unchanged. Doing well. Has gained some weight. No sxs of hyperglycemia.  She denies exertional CP or SOB or symptomatic hypoglycemia. She is now seeing chronic pain for chronic narcotics for neuropathy pain and is sleeping better.  Remains on high dose gabapentin as well.  HTN and HLD but statin intolerant: non fasting for labs today. Feeling well. Taking medications w/o adverse effects. No symptoms of CHF, angina; no palpitations, sob, cp or lower extremity edema. Compliant with meds.   HM: due mammogram and eye exam: pt had put them off but is willing to schedule now. Goes to GYN office for mammo and [employer] for eyes.  Wt Readings from Last 3 Encounters:  08/27/19 202 lb (91.6 kg)  05/24/19 192 lb 6.4 oz (87.3 kg)  02/22/19 192 lb 4.8 oz (87.2 kg)    BP Readings from Last 3 Encounters:  08/27/19 120/82  05/24/19 120/72  02/22/19 116/66    Assessment  1. Controlled type 2 diabetes mellitus with diabetic polyneuropathy, without long-term current use of insulin ([place])   2. Essential hypertension   3. Diabetic peripheral neuropathy ([place])   4. Refusal of statin medication by patient   5. Obesity (BMI 30-39.9)   6. Obstructive sleep apnea   7. Need for immunization against influenza      Plan   Diabetes is currently very well controlled. Continue same meds. rec eye exam. Flu shot today. Check labs.   Neuropathy now managed by pain clinic  HTN is controlled.   Doing well overall.  HM: rec mammo  Follow up: No follow-ups on file.. Orders Placed This Encounter  Procedures  . Flu Vaccine QUAD High Dose(Fluad)  . CBC with  Differential/Platelet  . Comprehensive metabolic panel  . Lipid panel  . TSH  . POCT glycosylated hemoglobin (Hb A1C)   No orders of the defined types were placed in this encounter.     Immunization History  Administered Date(s) Administered  . Fluad Quad(high Dose 65+) 08/27/2019  . Influenza, High Dose Seasonal PF 08/19/2014, 09/02/2015, 10/08/2016, 09/22/2017, 08/29/2018  . Pneumococcal Conjugate-13 10/18/2016  . Pneumococcal Polysaccharide-23 08/19/2014    Diabetes Related Lab Review: Lab Results  Component Value Date   HGBA1C 6.1 (A) 08/27/2019   HGBA1C 6.1 (A) 05/24/2019   HGBA1C 6.5 (A) 11/28/2018    No results found for: Derl Barrow Lab Results  Component Value Date   CREATININE 1.1 11/06/2018   BUN 18 11/06/2018   NA 139 11/06/2018   K 3.6 11/06/2018   CL 98 07/26/2014   CO2 21 07/26/2014   Lab Results  Component Value Date   CHOL 184 11/06/2018   Lab Results  Component Value Date   HDL 40 11/06/2018   Lab Results  Component Value Date   LDLCALC 87 11/06/2018   Lab Results  Component Value Date   TRIG 284 (A) 11/06/2018   No results found for: CHOLHDL No results found for: LDLDIRECT The 10-year ASCVD risk score Mikey Bussing DC Jr., et al., 2013) is: 21.2%*   Values used to calculate the score:     Age:  21 years     Sex: Female     Is Non-Hispanic African American: No     Diabetic: Yes     Tobacco smoker: No     Systolic Blood Pressure: 123456 mmHg     Is BP treated: Yes     HDL Cholesterol: 40 mg/dL*     Total Cholesterol: 184 mg/dL     * - Cholesterol units were assumed for this score calculation I have reviewed the PMH, Fam and Soc history. Patient Active Problem List   Diagnosis Date Noted  . Type 2 diabetes mellitus, uncontrolled, with neuropathy ([place]) 01/28/2015    Priority: High  . Obesity (BMI 30-39.9) 02/22/2019  . Refusal of statin medication by patient 01/24/2018    Husband has problems with this medication for her   .  Seasonal allergic rhinitis 01/24/2018  . Moderate protein-calorie malnutrition ([place]) 12/03/2016  . PVC's (premature ventricular contractions) 10/08/2016  . Diabetic peripheral neuropathy ([place]) 09/28/2016    Failed lyrica   . S/P colostomy takedown 07/02/2015  . Osteopenia 05/19/2015    Overview:  T = -1.4 Dexa 2016.   [name] Kitchen Essential hypertension 01/28/2015  . Diverticulitis of large intestine 08/19/2014    Overview:  2015   . Hydradenitis 08/19/2014  . Nephrolithiasis 08/19/2014    Overview:  Dr. Kellie Simmering   . Obstructive sleep apnea 08/01/2014  . Controlled type 2 diabetes mellitus with diabetic polyneuropathy, without long-term current use of insulin ([place]) 06/10/2014    Social History: Patient  reports that she quit smoking about 22 years ago. Her smoking use included cigarettes. She has a 25.00 pack-year smoking history. She has never used smokeless tobacco. She reports current alcohol use. She reports that she does not use drugs.  Review of Systems: Ophthalmic: negative for eye pain, loss of vision or double vision Cardiovascular: negative for chest pain Respiratory: negative for SOB or persistent cough Gastrointestinal: negative for abdominal pain Genitourinary: negative for dysuria or gross hematuria MSK: negative for foot lesions Neurologic: negative for weakness or gait disturbance  Objective  Vitals: BP 120/82   Pulse 71   Temp (!) 97.2 F (36.2 C) (Tympanic)   Resp 16   Ht 5\' 6"  (1.676 m)   Wt 202 lb (91.6 kg)   SpO2 98%   BMI 32.60 kg/m  General: well appearing, no acute distress  Psych:  Alert and oriented, normal mood and affect HEENT:  Normocephalic, atraumatic, moist mucous membranes, supple neck  Cardiovascular:  Nl S1 and S2, RRR without murmur, gallop or rub. no edema Respiratory:  Good breath sounds bilaterally, CTAB with normal effort, no rales Gastrointestinal: normal BS, soft, nontender Skin:  Warm, no rashes     Diabetic education: ongoing  education regarding chronic disease management for diabetes was given today. We continue to reinforce the ABC's of diabetic management: A1c (<7 or 8 dependent upon patient), tight blood pressure control, and cholesterol management with goal LDL < 100 minimally. We discuss diet strategies, exercise recommendations, medication options and possible side effects. At each visit, we review recommended immunizations and preventive care recommendations for diabetics and stress that good diabetic control can prevent other problems. See below for this patient's data.    Commons side effects, risks, benefits, and alternatives for medications and treatment plan prescribed today were discussed, and the patient expressed understanding of the given instructions. Patient is instructed to call or message via MyChart if he/she has any questions or concerns regarding our treatment plan. No barriers to understanding were  identified. We discussed Red Flag symptoms and signs in detail. Patient expressed understanding regarding what to do in case of urgent or emergency type symptoms.   Medication list was reconciled, printed and provided to the patient in AVS. Patient instructions and summary information was reviewed with the patient as documented in the AVS. This note was prepared with assistance of Dragon voice recognition software. Occasional wrong-word or sound-a-like substitutions may have occurred due to the inherent limitations of voice recognition software

## 2019-08-27 NOTE — Progress Notes (Signed)
Please call patient: I have reviewed his/her lab results. nonfasting labs are all stable.  (statin intolerant)

## 2019-08-27 NOTE — Patient Instructions (Signed)
Please return in 3 months for diabetes follow up  Today you were given your flu vaccination.   If you have any questions or concerns, please don't hesitate to send me a message via MyChart or call the office at [phone]. Thank you for visiting with [place] today! It's our pleasure caring for you.  Please schedule your mammogram and have them send me the results.  Please set up an appointment for a diabetic eye exam and have the results sent to me.   Things look great. Take care of yourself!

## 2019-08-28 DIAGNOSIS — M792 Neuralgia and neuritis, unspecified: Secondary | ICD-10-CM | POA: Diagnosis not present

## 2019-08-28 DIAGNOSIS — G629 Polyneuropathy, unspecified: Secondary | ICD-10-CM | POA: Diagnosis not present

## 2019-08-28 DIAGNOSIS — Z79899 Other long term (current) drug therapy: Secondary | ICD-10-CM | POA: Diagnosis not present

## 2019-08-28 DIAGNOSIS — Z79891 Long term (current) use of opiate analgesic: Secondary | ICD-10-CM | POA: Diagnosis not present

## 2019-08-28 DIAGNOSIS — G894 Chronic pain syndrome: Secondary | ICD-10-CM | POA: Diagnosis not present

## 2019-08-29 ENCOUNTER — Encounter: Payer: Self-pay | Admitting: Gynecology

## 2019-08-30 ENCOUNTER — Other Ambulatory Visit: Payer: Self-pay | Admitting: *Deleted

## 2019-08-30 MED ORDER — LOSARTAN POTASSIUM 100 MG PO TABS: 100.0000 mg | ORAL_TABLET | Freq: Every day | ORAL | 3 refills | Status: DC

## 2019-09-17 ENCOUNTER — Other Ambulatory Visit: Payer: Self-pay | Admitting: *Deleted

## 2019-09-17 MED ORDER — HYDROCHLOROTHIAZIDE 25 MG PO TABS: 25.0000 mg | ORAL_TABLET | Freq: Every day | ORAL | 3 refills | Status: DC

## 2019-09-25 DIAGNOSIS — Z79899 Other long term (current) drug therapy: Secondary | ICD-10-CM | POA: Diagnosis not present

## 2019-09-25 DIAGNOSIS — G894 Chronic pain syndrome: Secondary | ICD-10-CM | POA: Diagnosis not present

## 2019-09-25 DIAGNOSIS — G629 Polyneuropathy, unspecified: Secondary | ICD-10-CM | POA: Diagnosis not present

## 2019-09-25 DIAGNOSIS — M792 Neuralgia and neuritis, unspecified: Secondary | ICD-10-CM | POA: Diagnosis not present

## 2019-09-25 DIAGNOSIS — Z79891 Long term (current) use of opiate analgesic: Secondary | ICD-10-CM | POA: Diagnosis not present

## 2019-10-02 ENCOUNTER — Ambulatory Visit: Payer: Self-pay

## 2019-10-02 NOTE — Telephone Encounter (Signed)
Pt wanting to non-expired tramadol for neuropathy. Seeing pain management but hand neuropathy is getting worse and today is a bad day.  Pt is wanting to know what else she can take for her pain- Office is closed. Pt stated that she doesn't want to get into trouble for taking the tramadol based on drug test.  Advised to call pain clinic for recommendations. Will forward to PCP.   Reason for Disposition . [1] Caller has URGENT medication question about med that PCP or specialist prescribed AND [2] triager unable to answer question  Answer Assessment - Initial Assessment Questions 1.   NAME of MEDICATION: "What medicine are you calling about?"     If she can take tramadol  2.   QUESTION: "What is your question?"     Can I take 2 tramadol for my hand pain from neuropathy? 3.   PRESCRIBING HCP: "Who prescribed it?" Reason: if prescribed by specialist, call should be referred to that group.     Not a current med prescribed by Dr Jonni Sanger 4. SYMPTOMS: "Do you have any symptoms?"     Hand pain like pins and needles 5. SEVERITY: If symptoms are present, ask "Are they mild, moderate or severe?"     Moderate  6.  PREGNANCY:  "Is there any chance that you are pregnant?" "When was your last menstrual period?"     n/a  Protocols used: MEDICATION QUESTION CALL-A-AH

## 2019-10-02 NOTE — Telephone Encounter (Signed)
See note

## 2019-10-03 NOTE — Telephone Encounter (Signed)
Please advise 

## 2019-10-03 NOTE — Telephone Encounter (Signed)
Agree, need to clarify with pain management.  I'd be fine with it.

## 2019-10-04 NOTE — Telephone Encounter (Signed)
Left message to return call to our office.  

## 2019-10-09 NOTE — Telephone Encounter (Signed)
Left message to return call to our office.  

## 2019-10-30 DIAGNOSIS — Z79899 Other long term (current) drug therapy: Secondary | ICD-10-CM | POA: Diagnosis not present

## 2019-10-30 DIAGNOSIS — G629 Polyneuropathy, unspecified: Secondary | ICD-10-CM | POA: Diagnosis not present

## 2019-10-30 DIAGNOSIS — G894 Chronic pain syndrome: Secondary | ICD-10-CM | POA: Diagnosis not present

## 2019-10-30 DIAGNOSIS — Z79891 Long term (current) use of opiate analgesic: Secondary | ICD-10-CM | POA: Diagnosis not present

## 2019-10-30 DIAGNOSIS — M792 Neuralgia and neuritis, unspecified: Secondary | ICD-10-CM | POA: Diagnosis not present

## 2019-10-31 ENCOUNTER — Other Ambulatory Visit: Payer: Self-pay

## 2019-11-01 ENCOUNTER — Ambulatory Visit (INDEPENDENT_AMBULATORY_CARE_PROVIDER_SITE_OTHER): Payer: Medicare Other | Admitting: Family Medicine

## 2019-11-01 ENCOUNTER — Encounter: Payer: Self-pay | Admitting: Family Medicine

## 2019-11-01 VITALS — BP 118/74 | HR 78 | Temp 97.2°F | Ht 66.0 in | Wt 205.0 lb

## 2019-11-01 DIAGNOSIS — E669 Obesity, unspecified: Secondary | ICD-10-CM | POA: Diagnosis not present

## 2019-11-01 DIAGNOSIS — E1142 Type 2 diabetes mellitus with diabetic polyneuropathy: Secondary | ICD-10-CM | POA: Diagnosis not present

## 2019-11-01 DIAGNOSIS — F1190 Opioid use, unspecified, uncomplicated: Secondary | ICD-10-CM

## 2019-11-01 DIAGNOSIS — Z20828 Contact with and (suspected) exposure to other viral communicable diseases: Secondary | ICD-10-CM | POA: Diagnosis not present

## 2019-11-01 DIAGNOSIS — I10 Essential (primary) hypertension: Secondary | ICD-10-CM | POA: Diagnosis not present

## 2019-11-01 NOTE — Progress Notes (Signed)
Subjective  CC:  Chief Complaint  Patient presents with  . Diabetes    HPI: Marilyn Myers is a 68 y.o. female who presents to the office today for follow up of diabetes and problems listed above in the chief complaint.   Diabetes follow up: Her diabetic control is reported as Unchanged. Doing fine.  She denies exertional CP or SOB or symptomatic hypoglycemia. On gabapentin and norco for peripheral neuropathy: she will be moving to [facility] and is trying to establish with a pain mgt speialist office there. She is excited about the transition; will live with her daughter and her family.   HTN is controlled  She has gained some weight back due to the business of moving.  She has been unable to schedule her mammogram yet.   Wt Readings from Last 3 Encounters:  11/01/19 205 lb (93 kg)  08/27/19 202 lb (91.6 kg)  05/24/19 192 lb 6.4 oz (87.3 kg)    BP Readings from Last 3 Encounters:  11/01/19 118/74  08/27/19 120/82  05/24/19 120/72    Assessment  1. Obesity (BMI 30-39.9)   2. Diabetic peripheral neuropathy ([place])   3. Essential hypertension   4. Controlled type 2 diabetes mellitus with diabetic polyneuropathy, without long-term current use of insulin ([place])   5. Chronic narcotic use      Plan   Diabetes is currently very well controlled. RX are up to date. No changes today. Will call when refills due if due before she is able to establish with new PCP in [place] quickly.   HTN and obesity: no new recs  Pain mgt: well controlled sxs on current regimen. Her use of pain meds has been limited, consistent and appropriate.   Follow up: f/u with new pcp in [place]. No orders of the defined types were placed in this encounter.  No orders of the defined types were placed in this encounter.     Immunization History  Administered Date(s) Administered  . Fluad Quad(high Dose 65+) 08/27/2019  . Influenza, High Dose Seasonal PF 08/19/2014, 09/02/2015, 10/08/2016, 09/22/2017,  08/29/2018  . Pneumococcal Conjugate-13 10/18/2016  . Pneumococcal Polysaccharide-23 08/19/2014    Diabetes Related Lab Review: Lab Results  Component Value Date   HGBA1C 6.1 (A) 08/27/2019   HGBA1C 6.1 (A) 05/24/2019   HGBA1C 6.5 (A) 11/28/2018    No results found for: Derl Barrow Lab Results  Component Value Date   CREATININE 1.06 08/27/2019   BUN 23 08/27/2019   NA 137 08/27/2019   K 3.7 08/27/2019   CL 98 08/27/2019   CO2 31 08/27/2019   Lab Results  Component Value Date   CHOL 200 08/27/2019   CHOL 184 11/06/2018   Lab Results  Component Value Date   HDL 41.30 08/27/2019   HDL 40 11/06/2018   Lab Results  Component Value Date   LDLCALC 87 11/06/2018   Lab Results  Component Value Date   TRIG 392.0 (H) 08/27/2019   TRIG 284 (A) 11/06/2018   Lab Results  Component Value Date   CHOLHDL 5 08/27/2019   Lab Results  Component Value Date   LDLDIRECT 115.0 08/27/2019   The 10-year ASCVD risk score Mikey Bussing DC Jr., et al., 2013) is: 20.9%   Values used to calculate the score:     Age: 67 years     Sex: Female     Is Non-Hispanic African American: No     Diabetic: Yes     Tobacco smoker: No  Systolic Blood Pressure: 123456 mmHg     Is BP treated: Yes     HDL Cholesterol: 41.3 mg/dL     Total Cholesterol: 200 mg/dL I have reviewed the PMH, Fam and Soc history. Patient Active Problem List   Diagnosis Date Noted  . Type 2 diabetes mellitus, uncontrolled, with neuropathy ([place]) 01/28/2015    Priority: High  . Obesity (BMI 30-39.9) 02/22/2019  . Refusal of statin medication by patient 01/24/2018    Husband has problems with this medication for her   . Seasonal allergic rhinitis 01/24/2018  . Moderate protein-calorie malnutrition ([place]) 12/03/2016  . PVC's (premature ventricular contractions) 10/08/2016  . Diabetic peripheral neuropathy ([place]) 09/28/2016    Failed lyrica   . S/P colostomy takedown 07/02/2015  . Osteopenia 05/19/2015    Overview:   T = -1.4 Dexa 2016.   [name] Kitchen Essential hypertension 01/28/2015  . Diverticulitis of large intestine 08/19/2014    Overview:  2015   . Hydradenitis 08/19/2014  . Nephrolithiasis 08/19/2014    Overview:  Dr. Kellie Simmering   . Obstructive sleep apnea 08/01/2014  . Controlled type 2 diabetes mellitus with diabetic polyneuropathy, without long-term current use of insulin ([place]) 06/10/2014    Social History: Patient  reports that she quit smoking about 23 years ago. Her smoking use included cigarettes. She has a 25.00 pack-year smoking history. She has never used smokeless tobacco. She reports current alcohol use. She reports that she does not use drugs.  Review of Systems: Ophthalmic: negative for eye pain, loss of vision or double vision Cardiovascular: negative for chest pain Respiratory: negative for SOB or persistent cough Gastrointestinal: negative for abdominal pain Genitourinary: negative for dysuria or gross hematuria MSK: negative for foot lesions Neurologic: negative for weakness or gait disturbance  Objective  Vitals: BP 118/74 (BP Location: Left Arm, Patient Position: Sitting, Cuff Size: Large)   Pulse 78   Temp (!) 97.2 F (36.2 C) (Temporal)   Ht 5\' 6"  (1.676 m)   Wt 205 lb (93 kg)   SpO2 98%   BMI 33.09 kg/m  General: well appearing, no acute distress  Psych:  Alert and oriented, normal mood and affect Skin:  Warm, no rashes Neurologic:   Mental status is normal. normal gait Foot exam: no erythema, pallor, or cyanosis visible nl proprioception and sensation to monofilament testing bilaterally, +2 distal pulses bilaterally    Diabetic education: ongoing education regarding chronic disease management for diabetes was given today. We continue to reinforce the ABC's of diabetic management: A1c (<7 or 8 dependent upon patient), tight blood pressure control, and cholesterol management with goal LDL < 100 minimally. We discuss diet strategies, exercise recommendations, medication  options and possible side effects. At each visit, we review recommended immunizations and preventive care recommendations for diabetics and stress that good diabetic control can prevent other problems. See below for this patient's data.    Commons side effects, risks, benefits, and alternatives for medications and treatment plan prescribed today were discussed, and the patient expressed understanding of the given instructions. Patient is instructed to call or message via MyChart if he/she has any questions or concerns regarding our treatment plan. No barriers to understanding were identified. We discussed Red Flag symptoms and signs in detail. Patient expressed understanding regarding what to do in case of urgent or emergency type symptoms.   Medication list was reconciled, printed and provided to the patient in AVS. Patient instructions and summary information was reviewed with the patient as documented in the AVS. This  note was prepared with assistance of Systems analyst. Occasional wrong-word or sound-a-like substitutions may have occurred due to the inherent limitations of voice recognition software  This visit occurred during the SARS-CoV-2 public health emergency.  Safety protocols were in place, including screening questions prior to the visit, additional usage of staff PPE, and extensive cleaning of exam room while observing appropriate contact time as indicated for disinfecting solutions.

## 2019-11-01 NOTE — Patient Instructions (Signed)
I wish you the best!! It has been a pleasure knowing you.   If you have any questions or concerns, please don't hesitate to send me a message via MyChart or call the office at [phone]. Thank you for visiting with [place] today! It's our pleasure caring for you.

## 2019-11-27 ENCOUNTER — Telehealth: Payer: Self-pay | Admitting: Family Medicine

## 2019-11-27 DIAGNOSIS — G894 Chronic pain syndrome: Secondary | ICD-10-CM | POA: Diagnosis not present

## 2019-11-27 DIAGNOSIS — Z79899 Other long term (current) drug therapy: Secondary | ICD-10-CM | POA: Diagnosis not present

## 2019-11-27 DIAGNOSIS — Z79891 Long term (current) use of opiate analgesic: Secondary | ICD-10-CM | POA: Diagnosis not present

## 2019-11-27 NOTE — Telephone Encounter (Signed)
Called pt and LVM to reschedule cancelled appt on 11/27/2019 with Dr. Jonni Sanger

## 2019-11-28 ENCOUNTER — Ambulatory Visit: Payer: Medicare Other | Admitting: Family Medicine

## 2019-12-10 ENCOUNTER — Ambulatory Visit (INDEPENDENT_AMBULATORY_CARE_PROVIDER_SITE_OTHER): Payer: Medicare Other

## 2019-12-10 VITALS — BP 126/72 | HR 86 | Temp 98.1°F | Ht 66.0 in | Wt 206.0 lb

## 2019-12-10 DIAGNOSIS — Z0000 Encounter for general adult medical examination without abnormal findings: Secondary | ICD-10-CM | POA: Diagnosis not present

## 2019-12-10 NOTE — Patient Instructions (Signed)
[name] , Thank you for taking time to come for your Medicare Wellness Visit. I appreciate your ongoing commitment to your health goals. Please review the following plan we discussed and let me know if I can assist you in the future.   Screening recommendations/referrals: Colorectal Screening: up to date; last colonoscopy 06/28/18  Mammogram: up to date; last 05/30/18 Bone Density: up to date; last 07/04/18  Vision and Dental Exams: Recommended annual ophthalmology exams for early detection of glaucoma and other disorders of the eye Recommended annual dental exams for proper oral hygiene  Vaccinations: Influenza vaccine: completed 08/27/19 Pneumococcal vaccine: up to date; last 10/18/16 Tdap vaccine: recommended; Please call your insurance company to determine your out of pocket expense. You also receive this vaccine at your local pharmacy or Health Dept. Shingles vaccine: Please call your insurance company to determine your out of pocket expense for the Shingrix vaccine. You may receive this vaccine at your local pharmacy.  Advanced directives: Please bring a copy of your POA (Power of Attorney) and/or Living Will to your next appointment.  Goals: Recommend to drink at least 6-8 8oz glasses of water per day and consume a balanced diet rich in fresh fruits and vegetables.   Next appointment: Please schedule your Annual Wellness Visit with your Nurse Health Advisor in one year.  Preventive Care 60 Years and Older, Female Preventive care refers to lifestyle choices and visits with your health care provider that can promote health and wellness. What does preventive care include?  A yearly physical exam. This is also called an annual well check.  Dental exams once or twice a year.  Routine eye exams. Ask your health care provider how often you should have your eyes checked.  Personal lifestyle choices, including:  Daily care of your teeth and gums.  Regular physical activity.  Eating  a healthy diet.  Avoiding tobacco and drug use.  Limiting alcohol use.  Practicing safe sex.  Taking low-dose aspirin every day if recommended by your health care provider.  Taking vitamin and mineral supplements as recommended by your health care provider. What happens during an annual well check? The services and screenings done by your health care provider during your annual well check will depend on your age, overall health, lifestyle risk factors, and family history of disease. Counseling  Your health care provider may ask you questions about your:  Alcohol use.  Tobacco use.  Drug use.  Emotional well-being.  Home and relationship well-being.  Sexual activity.  Eating habits.  History of falls.  Memory and ability to understand (cognition).  Work and work Statistician.  Reproductive health. Screening  You may have the following tests or measurements:  Height, weight, and BMI.  Blood pressure.  Lipid and cholesterol levels. These may be checked every 5 years, or more frequently if you are over 54 years old.  Skin check.  Lung cancer screening. You may have this screening every year starting at age 62 if you have a 30-pack-year history of smoking and currently smoke or have quit within the past 15 years.  Fecal occult blood test (FOBT) of the stool. You may have this test every year starting at age 68.  Flexible sigmoidoscopy or colonoscopy. You may have a sigmoidoscopy every 5 years or a colonoscopy every 10 years starting at age 84.  Hepatitis C blood test.  Hepatitis B blood test.  Sexually transmitted disease (STD) testing.  Diabetes screening. This is done by checking your blood sugar (glucose) after  you have not eaten for a while (fasting). You may have this done every 1-3 years.  Bone density scan. This is done to screen for osteoporosis. You may have this done starting at age 45.  Mammogram. This may be done every 1-2 years. Talk to your  health care provider about how often you should have regular mammograms. Talk with your health care provider about your test results, treatment options, and if necessary, the need for more tests. Vaccines  Your health care provider may recommend certain vaccines, such as:  Influenza vaccine. This is recommended every year.  Tetanus, diphtheria, and acellular pertussis (Tdap, Td) vaccine. You may need a Td booster every 10 years.  Zoster vaccine. You may need this after age 39.  Pneumococcal 13-valent conjugate (PCV13) vaccine. One dose is recommended after age 79.  Pneumococcal polysaccharide (PPSV23) vaccine. One dose is recommended after age 68. Talk to your health care provider about which screenings and vaccines you need and how often you need them. This information is not intended to replace advice given to you by your health care provider. Make sure you discuss any questions you have with your health care provider. Document Released: 12/05/2015 Document Revised: 07/28/2016 Document Reviewed: 09/09/2015 Elsevier Interactive Patient Education  2017 [employer] Prevention in the Home Falls can cause injuries. They can happen to people of all ages. There are many things you can do to make your home safe and to help prevent falls. What can I do on the outside of my home?  Regularly fix the edges of walkways and driveways and fix any cracks.  Remove anything that might make you trip as you walk through a door, such as a raised step or threshold.  Trim any bushes or trees on the path to your home.  Use bright outdoor lighting.  Clear any walking paths of anything that might make someone trip, such as rocks or tools.  Regularly check to see if handrails are loose or broken. Make sure that both sides of any steps have handrails.  Any raised decks and porches should have guardrails on the edges.  Have any leaves, snow, or ice cleared regularly.  Use sand or salt on walking  paths during winter.  Clean up any spills in your garage right away. This includes oil or grease spills. What can I do in the bathroom?  Use night lights.  Install grab bars by the toilet and in the tub and shower. Do not use towel bars as grab bars.  Use non-skid mats or decals in the tub or shower.  If you need to sit down in the shower, use a plastic, non-slip stool.  Keep the floor dry. Clean up any water that spills on the floor as soon as it happens.  Remove soap buildup in the tub or shower regularly.  Attach bath mats securely with double-sided non-slip rug tape.  Do not have throw rugs and other things on the floor that can make you trip. What can I do in the bedroom?  Use night lights.  Make sure that you have a light by your bed that is easy to reach.  Do not use any sheets or blankets that are too big for your bed. They should not hang down onto the floor.  Have a firm chair that has side arms. You can use this for support while you get dressed.  Do not have throw rugs and other things on the floor that can make you trip.  What can I do in the kitchen?  Clean up any spills right away.  Avoid walking on wet floors.  Keep items that you use a lot in easy-to-reach places.  If you need to reach something above you, use a strong step stool that has a grab bar.  Keep electrical cords out of the way.  Do not use floor polish or wax that makes floors slippery. If you must use wax, use non-skid floor wax.  Do not have throw rugs and other things on the floor that can make you trip. What can I do with my stairs?  Do not leave any items on the stairs.  Make sure that there are handrails on both sides of the stairs and use them. Fix handrails that are broken or loose. Make sure that handrails are as long as the stairways.  Check any carpeting to make sure that it is firmly attached to the stairs. Fix any carpet that is loose or worn.  Avoid having throw rugs at  the top or bottom of the stairs. If you do have throw rugs, attach them to the floor with carpet tape.  Make sure that you have a light switch at the top of the stairs and the bottom of the stairs. If you do not have them, ask someone to add them for you. What else can I do to help prevent falls?  Wear shoes that:  Do not have high heels.  Have rubber bottoms.  Are comfortable and fit you well.  Are closed at the toe. Do not wear sandals.  If you use a stepladder:  Make sure that it is fully opened. Do not climb a closed stepladder.  Make sure that both sides of the stepladder are locked into place.  Ask someone to hold it for you, if possible.  Clearly mark and make sure that you can see:  Any grab bars or handrails.  First and last steps.  Where the edge of each step is.  Use tools that help you move around (mobility aids) if they are needed. These include:  Canes.  Walkers.  Scooters.  Crutches.  Turn on the lights when you go into a dark area. Replace any light bulbs as soon as they burn out.  Set up your furniture so you have a clear path. Avoid moving your furniture around.  If any of your floors are uneven, fix them.  If there are any pets around you, be aware of where they are.  Review your medicines with your doctor. Some medicines can make you feel dizzy. This can increase your chance of falling. Ask your doctor what other things that you can do to help prevent falls. This information is not intended to replace advice given to you by your health care provider. Make sure you discuss any questions you have with your health care provider. Document Released: 09/04/2009 Document Revised: 04/15/2016 Document Reviewed: 12/13/2014 Elsevier Interactive Patient Education  2017 [employer].

## 2019-12-10 NOTE — Progress Notes (Addendum)
Subjective:   Marilyn Myers is a 69 y.o. female who presents for Medicare Annual (Subsequent) preventive examination.  Review of Systems:   Cardiac Risk Factors include: advanced age (>20men, >53 women);dyslipidemia;diabetes mellitus;hypertension    Objective:     Vitals: BP 126/72   Pulse 86   Temp 98.1 F (36.7 C) (Temporal)   Ht 5\' 6"  (1.676 m)   Wt 206 lb (93.4 kg)   SpO2 96%   BMI 33.25 kg/m   Body mass index is 33.25 kg/m.  Advanced Directives 12/10/2019 12/07/2018 12/01/2017 04/26/2016 07/26/2014 01/24/2012 10/08/2011  Does Patient Have a Medical Advance Directive? Yes Yes Yes Yes No Patient does not have advance directive Patient does not have advance directive  Type of Advance Directive Living will;Healthcare Power of Attorney Living will;Healthcare Power of [place];Living will [place];Living will - - -  Does patient want to make changes to medical advance directive? No - Patient declined - - No - Patient declined - - -  Copy of [place] in Chart? No - copy requested No - copy requested No - copy requested No - copy requested - - -  Would patient like information on creating a medical advance directive? - - - - No - patient declined information - -  Pre-existing out of facility DNR order (yellow form or pink MOST form) - - - - - No No    Tobacco Social History   Tobacco Use  Smoking Status Former Smoker  . Packs/day: 1.00  . Years: 25.00  . Pack years: 25.00  . Types: Cigarettes  . Quit date: 10/07/1996  . Years since quitting: 23.1  Smokeless Tobacco Never Used     Counseling given: Not Answered   Clinical Intake:  Pre-visit preparation completed: Yes  Pain : No/denies pain  Diabetes: Yes CBG done?: No Did pt. bring in CBG monitor from home?: No  How often do you need to have someone help you when you read instructions, pamphlets, or other written materials from your doctor or  pharmacy?: 1 - Never  Interpreter Needed?: No  Information entered by :: Denman George LPN  Past Medical History:  Diagnosis Date  . Anxiety   . Diverticulitis of intestine without perforation or abscess   . Diverticulosis, sigmoid   . GERD (gastroesophageal reflux disease)   . History of diverticulitis of colon    09/ 2015  RESOLVED   . History of kidney stones   . Hypertension   . Nephrolithiasis    BILATERAL  . OSA on CPAP    wears cpap  . Right ureteral stone   . Seasonal allergic rhinitis 01/24/2018  . Type 2 diabetes mellitus ([place])    Past Surgical History:  Procedure Laterality Date  . BARIATRIC SURGERY    . CERVICAL CONE BIOPSY  1976  . COLON RESECTION     colostomy x 6 months  . CYSTO/  BILATERAL RETROGRADE PYELOGRAM/  PLACEMENT BILATERAL URETERAL STENTS  07-10-2011  . CYSTOSCOPY WITH RETROGRADE PYELOGRAM, URETEROSCOPY AND STENT PLACEMENT Right 10/11/2014   Procedure: CYSTOSCOPY WITH RETROGRADE PYELOGRAM, URETEROSCOPY, right ureteral biopsy, AND STENT PLACEMENT;  Surgeon: Arvil Persons, MD;  Location: [facility];  Service: Urology;  Laterality: Right;  . CYSTOSCOPY/RETROGRADE/URETEROSCOPY  10/08/2011   Procedure: CYSTOSCOPY/RETROGRADE/URETEROSCOPY;  Surgeon: Hanley Ben, MD;  Location: [facility];  Service: Urology;  Laterality: N/A;  CYSTOSCOPY, RIGHT  RETROGRADE, RIGHT URETEROSCOPY HOLMIUM LASER WITH BASKET STONE  EXTRACTION AND RIGHT URETERAL STENT PLACEMENT  . EXTRACORPOREAL SHOCK WAVE LITHOTRIPSY  x3  last one 01-24-2012  . TUBAL LIGATION  1987  . URETEROSCPIC STONE EXTRACTION/ STENT PLACEMENT  left 08-20-2011   Family History  Problem Relation Age of Onset  . Breast cancer Mother        57  . Renal Disease Mother   . Heart disease Mother   . Diabetes Paternal Grandmother   . Diabetes Maternal Aunt    Social History   Socioeconomic History  . Marital status: Widowed    Spouse name: Not on file  . Number of children: 3    . Years of education: Not on file  . Highest education level: Not on file  Occupational History  . Occupation: Retired   Tobacco Use  . Smoking status: Former Smoker    Packs/day: 1.00    Years: 25.00    Pack years: 25.00    Types: Cigarettes    Quit date: 10/07/1996    Years since quitting: 23.1  . Smokeless tobacco: Never Used  Substance and Sexual Activity  . Alcohol use: Yes    Comment: rare  . Drug use: No  . Sexual activity: Never  Other Topics Concern  . Not on file  Social History Narrative  . Not on file   Social Determinants of Health   Financial Resource Strain:   . Difficulty of Paying Living Expenses: Not on file  Food Insecurity:   . Worried About Charity fundraiser in the Last Year: Not on file  . Ran Out of Food in the Last Year: Not on file  Transportation Needs:   . Lack of Transportation (Medical): Not on file  . Lack of Transportation (Non-Medical): Not on file  Physical Activity:   . Days of Exercise per Week: Not on file  . Minutes of Exercise per Session: Not on file  Stress:   . Feeling of Stress : Not on file  Social Connections:   . Frequency of Communication with Friends and Family: Not on file  . Frequency of Social Gatherings with Friends and Family: Not on file  . Attends Religious Services: Not on file  . Active Member of Clubs or Organizations: Not on file  . Attends Archivist Meetings: Not on file  . Marital Status: Not on file    Outpatient Encounter Medications as of 12/10/2019  Medication Sig  . Biotin 5000 MCG CAPS Take by mouth.  . Calcium Carbonate-Vitamin D (CALCIUM-VITAMIN D3 PO) Take by mouth.  . Cetirizine HCl (ZYRTEC ALLERGY PO) Take by mouth.  . Cholecalciferol (VITAMIN D3) 1000 UNITS CAPS Take 1 capsule by mouth daily.  . Cyanocobalamin (VITAMIN B 12 PO) Take by mouth.  . diltiazem (CARDIZEM CD) 120 MG 24 hr capsule TAKE 1 CAPSULE BY MOUTH EVERY DAY  . FA-B6-B12-Omega 3-Phytosterols (BP VIT 3 PO) Take by  mouth.  . FIBER ADULT GUMMIES 2 g CHEW Chew by mouth.  . gabapentin (NEURONTIN) 600 MG tablet TAKE 1 TABLET 4 TIMES A DAY  . hydrochlorothiazide (HYDRODIURIL) 25 MG tablet Take 1 tablet (25 mg total) by mouth daily.  [name] Kitchen HYDROcodone-acetaminophen (NORCO) 10-325 MG tablet TAKE 1 TABLET BY MOUTH EVERYDAY AT BEDTIME  . losartan (COZAAR) 100 MG tablet Take 1 tablet (100 mg total) by mouth daily.  . metFORMIN (GLUCOPHAGE) 1000 MG tablet Take 1 tablet (1,000 mg total) by mouth 2 (two) times daily.  . Multiple Vitamin (MULTIVITAMIN) capsule Take by mouth.  [name] Kitchen  pantoprazole (PROTONIX) 20 MG tablet TAKE 1 TABLET BY MOUTH EVERY DAY (Patient not taking: Reported on 12/10/2019)   No facility-administered encounter medications on file as of 12/10/2019.    Activities of Daily Living In your present state of health, do you have any difficulty performing the following activities: 12/10/2019  Hearing? N  Vision? N  Difficulty concentrating or making decisions? N  Walking or climbing stairs? N  Dressing or bathing? N  Doing errands, shopping? N  Preparing Food and eating ? N  Using the Toilet? N  In the past six months, have you accidently leaked urine? N  Do you have problems with loss of bowel control? N  Managing your Medications? N  Managing your Finances? N  Housekeeping or managing your Housekeeping? N  Some recent data might be hidden    Patient Care Team: [name], MD as PCP - General (Family Medicine) Dasher, Jeneen Rinks, MD (Surgery) Princess Bruins, MD as Consulting Physician (Obstetrics and Gynecology) Idolina Primer [facility]) Harriett Sine, MD as Consulting Physician (Dermatology) Roche, Christian Mate, PA-C as Physician Assistant (Pain Medicine)    Assessment:   This is a routine wellness examination for [place].  Exercise Activities and Dietary recommendations Current Exercise Habits: The patient does not participate in regular exercise at present  Goals    . Patient Stated       Follow diet and organize/clean house    . Weight (lb) < 200 lb (90.7 kg)     Begin "bariatric diet" again.        Fall Risk Fall Risk  12/10/2019 02/22/2019 12/07/2018 01/24/2018 12/01/2017  Falls in the past year? 0 0 0 No No  Number falls in past yr: 0 0 - - -  Injury with Fall? 0 0 - - -  Follow up Falls evaluation completed;Education provided;Falls prevention discussed Falls evaluation completed - - -   Is the patient's home free of loose throw rugs in walkways, pet beds, electrical cords, etc?   yes      Grab bars in the bathroom? yes      Handrails on the stairs?   yes      Adequate lighting?   yes  Timed Get Up and Go performed: completed and within normal timeframe; no gait abnormalities noted   Depression Screen PHQ 2/9 Scores 02/22/2019 12/07/2018 01/24/2018 12/01/2017  PHQ - 2 Score 0 0 0 0     Cognitive Function MMSE - Mini Mental State Exam 12/07/2018 12/01/2017  Orientation to time 5 5  Orientation to Place 5 5  Registration 3 3  Attention/ Calculation 5 5  Recall 3 3  Language- name 2 objects 2 2  Language- repeat 1 1  Language- follow 3 step command 3 3  Language- read & follow direction 1 1  Write a sentence 1 1  Copy design 1 1  Total score 30 30     6CIT Screen 12/10/2019  What Year? 0 points  What month? 0 points  What time? 0 points  Count back from 20 0 points  Months in reverse 0 points  Repeat phrase 0 points  Total Score 0    Immunization History  Administered Date(s) Administered  . Fluad Quad(high Dose 65+) 08/27/2019  . Influenza, High Dose Seasonal PF 08/19/2014, 09/02/2015, 10/08/2016, 09/22/2017, 08/29/2018  . Pneumococcal Conjugate-13 10/18/2016  . Pneumococcal Polysaccharide-23 08/19/2014    Qualifies for Shingles Vaccine?Discussed and patient will check with pharmacy for coverage.  Patient education handout provided  Screening Tests Health Maintenance  Topic Date Due  . Hepatitis C Screening  04-08-1951  . FOOT EXAM  09/20/2019   . MAMMOGRAM  04/22/2020 (Originally 05/31/2019)  . OPHTHALMOLOGY EXAM  11/22/2020 (Originally 06/29/2019)  . HEMOGLOBIN A1C  02/25/2020  . DEXA SCAN  07/04/2021  . COLONOSCOPY  06/28/2028  . INFLUENZA VACCINE  Completed  . PNA vac Low Risk Adult  Completed    Cancer Screenings: Lung: Low Dose CT Chest recommended if Age 22-80 years, 30 pack-year currently smoking OR have quit w/in 15years. Patient does not qualify. Breast:  Up to date on Mammogram? Yes   Up to date of Bone Density/Dexa? Yes Colorectal: colonoscopy 06/28/18 with Dr. Shary Key    Plan:  I have personally reviewed and addressed the Medicare Annual Wellness questionnaire and have noted the following in the patient's chart:  A. Medical and social history B. Use of alcohol, tobacco or illicit drugs  C. Current medications and supplements D. Functional ability and status E.  Nutritional status F.  Physical activity G. Advance directives H. List of other physicians I.  Hospitalizations, surgeries, and ER visits in previous 12 months J.  [place] such as hearing and vision if needed, cognitive and depression L. Referrals, records requested, and appointments- none   In addition, I have reviewed and discussed with patient certain preventive protocols, quality metrics, and best practice recommendations. A written personalized care plan for preventive services as well as general preventive health recommendations were provided to patient.   Signed,  Denman George, LPN  Nurse Health Advisor   Nurse Notes: see telephone message   I have reviewed documentation for AWV and Advance Care planning provided by [facility], I agree with documentation, I was immediately available for any questions. Inda Coke, [place]

## 2019-12-13 ENCOUNTER — Ambulatory Visit: Payer: Medicare Other

## 2019-12-25 ENCOUNTER — Other Ambulatory Visit: Payer: Self-pay | Admitting: Family Medicine

## 2019-12-25 DIAGNOSIS — G629 Polyneuropathy, unspecified: Secondary | ICD-10-CM | POA: Diagnosis not present

## 2019-12-25 DIAGNOSIS — G894 Chronic pain syndrome: Secondary | ICD-10-CM | POA: Diagnosis not present

## 2019-12-25 DIAGNOSIS — M792 Neuralgia and neuritis, unspecified: Secondary | ICD-10-CM | POA: Diagnosis not present

## 2019-12-25 NOTE — Telephone Encounter (Signed)
LAST APPOINTMENT DATE: 11/01/19  NEXT APPOINTMENT DATE:@Visit  date not found   LAST REFILL: 07/03/19  QTY: 360 tablets

## 2019-12-26 ENCOUNTER — Telehealth: Payer: Self-pay

## 2019-12-26 NOTE — Telephone Encounter (Signed)
Please verify this refill request. I believe the patient moved to [place].

## 2019-12-26 NOTE — Telephone Encounter (Signed)
Patient has not moved to [facility] yet. She will be moving sometime around March. She also wants to come in for an OV with you before she leaves for a DM/routine f/u appointment. Will get this appointment set up. Thanks.

## 2019-12-26 NOTE — Telephone Encounter (Signed)
Patient will be moving to [facility] sometime around March and would like to come in for a follow up appointment to see Dr. Jonni Sanger for Diabetes, etc before she leaves. Please schedule. Thanks.

## 2020-01-01 ENCOUNTER — Ambulatory Visit: Payer: Medicare Other

## 2020-01-03 ENCOUNTER — Ambulatory Visit: Payer: Medicare Other

## 2020-01-07 ENCOUNTER — Encounter: Payer: Self-pay | Admitting: Family Medicine

## 2020-01-07 ENCOUNTER — Other Ambulatory Visit: Payer: Self-pay

## 2020-01-07 ENCOUNTER — Ambulatory Visit (INDEPENDENT_AMBULATORY_CARE_PROVIDER_SITE_OTHER): Payer: Medicare Other | Admitting: Family Medicine

## 2020-01-07 VITALS — BP 132/70 | HR 60 | Temp 95.8°F | Ht 66.0 in | Wt 208.4 lb

## 2020-01-07 DIAGNOSIS — F1190 Opioid use, unspecified, uncomplicated: Secondary | ICD-10-CM | POA: Insufficient documentation

## 2020-01-07 DIAGNOSIS — K219 Gastro-esophageal reflux disease without esophagitis: Secondary | ICD-10-CM

## 2020-01-07 DIAGNOSIS — E669 Obesity, unspecified: Secondary | ICD-10-CM | POA: Diagnosis not present

## 2020-01-07 DIAGNOSIS — I10 Essential (primary) hypertension: Secondary | ICD-10-CM

## 2020-01-07 DIAGNOSIS — E1142 Type 2 diabetes mellitus with diabetic polyneuropathy: Secondary | ICD-10-CM

## 2020-01-07 LAB — POCT GLYCOSYLATED HEMOGLOBIN (HGB A1C): Hemoglobin A1C: 6.7 % — AB (ref 4.0–5.6)

## 2020-01-07 NOTE — Patient Instructions (Signed)
Please return in 3-6 months for recheck of diabetes and hypertension.   Please get your mammogram done.  You are also due for a diabetic eye exam.   If you have any questions or concerns, please don't hesitate to send me a message via MyChart or call the office at [phone]. Thank you for visiting with [place] today! It's our pleasure caring for you.

## 2020-01-07 NOTE — Progress Notes (Signed)
Subjective  CC:  Chief Complaint  Patient presents with  . Diabetes    Does not check DM at home. 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 Hypertension    Takes HCTZ daily. no side effects. No HA, dizziness, or visual changes    HPI: Marilyn Myers is a 69 y.o. female who presents to the office today for follow up of diabetes and problems listed above in the chief complaint.   Diabetes follow up: Her diabetic control is reported as Unchanged. Has been controlled. On metformin.  She denies exertional CP or SOB or symptomatic hypoglycemia. She denies foot sores. Chronic severe peripheral neuropathy is controlled on gabapentingand nightly hydrocodone.  Due eye exam.   Hm: due mammo: made an appt next week at [facility].    HTN remains well controlled on dilt, hctz and ARB. Feeling well. Taking medications w/o adverse effects. No symptoms of CHF, angina; no palpitations, sob, cp or lower extremity edema. Compliant with meds.   Obesity: weight is up a few pounds due to weather and being home more.   Relocating to [place] in next 1-2 months.  Wt Readings from Last 3 Encounters:  01/07/20 208 lb 6.4 oz (94.5 kg)  12/10/19 206 lb (93.4 kg)  11/01/19 205 lb (93 kg)    BP Readings from Last 3 Encounters:  01/07/20 132/70  12/10/19 126/72  11/01/19 118/74    Assessment  1. Controlled type 2 diabetes mellitus with diabetic polyneuropathy, without long-term current use of insulin ([place])   2. Essential hypertension   3. Chronic narcotic use   4. Obesity (BMI 30-39.9)   5. Gastroesophageal reflux disease without esophagitis      Plan   Diabetes is currently well controlled. Continue medications as is.   Neuropathy is fairly well controlled.   HTN is controlled.   Will work on diet again for weight loss  GERd is controlled.   Mild OA in hands.   Follow up: 3-6 months for f/u if hasn't relocated to [facility] yet. Orders Placed This Encounter  Procedures  . POCT glycosylated hemoglobin  (Hb A1C)   No orders of the defined types were placed in this encounter.     Immunization History  Administered Date(s) Administered  . Fluad Quad(high Dose 65+) 08/27/2019  . Influenza, High Dose Seasonal PF 08/19/2014, 09/02/2015, 10/08/2016, 09/22/2017, 08/29/2018  . Pneumococcal Conjugate-13 10/18/2016  . Pneumococcal Polysaccharide-23 08/19/2014    Diabetes Related Lab Review: Lab Results  Component Value Date   HGBA1C 6.7 (A) 01/07/2020   HGBA1C 6.1 (A) 08/27/2019   HGBA1C 6.1 (A) 05/24/2019    No results found for: Derl Barrow Lab Results  Component Value Date   CREATININE 1.06 08/27/2019   BUN 23 08/27/2019   NA 137 08/27/2019   K 3.7 08/27/2019   CL 98 08/27/2019   CO2 31 08/27/2019   Lab Results  Component Value Date   CHOL 200 08/27/2019   CHOL 184 11/06/2018   Lab Results  Component Value Date   HDL 41.30 08/27/2019   HDL 40 11/06/2018   Lab Results  Component Value Date   LDLCALC 87 11/06/2018   Lab Results  Component Value Date   TRIG 392.0 (H) 08/27/2019   TRIG 284 (A) 11/06/2018   Lab Results  Component Value Date   CHOLHDL 5 08/27/2019   Lab Results  Component Value Date   LDLDIRECT 115.0 08/27/2019   The 10-year ASCVD risk score Mikey Bussing DC Jr., et al., 2013) is:  25.5%   Values used to calculate the score:     Age: 85 years     Sex: Female     Is Non-Hispanic African American: No     Diabetic: Yes     Tobacco smoker: No     Systolic Blood Pressure: [id number] mmHg     Is BP treated: Yes     HDL Cholesterol: 41.3 mg/dL     Total Cholesterol: 200 mg/dL I have reviewed the PMH, Fam and Soc history. Patient Active Problem List   Diagnosis Date Noted  . Gastroesophageal reflux disease without esophagitis 01/07/2020  . Chronic narcotic use 01/07/2020  . Obesity (BMI 30-39.9) 02/22/2019  . Refusal of statin medication by patient 01/24/2018    Husband has problems with this medication for her   . Seasonal allergic rhinitis  01/24/2018  . PVC's (premature ventricular contractions) 10/08/2016  . Diabetic peripheral neuropathy ([place]) 09/28/2016    Failed lyrica   . S/P colostomy takedown 07/02/2015  . Osteopenia 05/19/2015    Overview:  T = -1.4 Dexa 2016.   [name] Kitchen Essential hypertension 01/28/2015  . Diverticulitis of large intestine 08/19/2014    Overview:  2015   . Hydradenitis 08/19/2014  . Nephrolithiasis 08/19/2014    Overview:  Dr. Kellie Simmering   . Obstructive sleep apnea 08/01/2014  . Controlled type 2 diabetes mellitus with diabetic polyneuropathy, without long-term current use of insulin ([place]) 06/10/2014    Social History: Patient  reports that she quit smoking about 23 years ago. Her smoking use included cigarettes. She has a 25.00 pack-year smoking history. She has never used smokeless tobacco. She reports current alcohol use. She reports that she does not use drugs.  Review of Systems: Ophthalmic: negative for eye pain, loss of vision or double vision Cardiovascular: negative for chest pain Respiratory: negative for SOB or persistent cough Gastrointestinal: negative for abdominal pain Genitourinary: negative for dysuria or gross hematuria MSK: negative for foot lesions Neurologic: negative for weakness or gait disturbance  Objective  Vitals: BP 132/70 (BP Location: Left Arm, Patient Position: Sitting, Cuff Size: Large)   Pulse 60   Temp (!) 95.8 F (35.4 C) (Temporal)   Ht 5\' 6"  (1.676 m)   Wt 208 lb 6.4 oz (94.5 kg)   SpO2 97%   BMI 33.64 kg/m  General: well appearing, no acute distress  Psych:  Alert and oriented, normal mood and affect HEENT:  Normocephalic, atraumatic,  Cardiovascular:  Nl S1 and S2, RRR without murmur, gallop or rub. no edema Respiratory:  Good breath sounds bilaterally, CTAB with normal effort, no rales Foot exam: no erythema, pallor, or cyanosis visible nl proprioception and sensation to monofilament testing bilaterally, +2 distal pulses bilaterally    Diabetic  education: ongoing education regarding chronic disease management for diabetes was given today. We continue to reinforce the ABC's of diabetic management: A1c (<7 or 8 dependent upon patient), tight blood pressure control, and cholesterol management with goal LDL < 100 minimally. We discuss diet strategies, exercise recommendations, medication options and possible side effects. At each visit, we review recommended immunizations and preventive care recommendations for diabetics and stress that good diabetic control can prevent other problems. See below for this patient's data.    Commons side effects, risks, benefits, and alternatives for medications and treatment plan prescribed today were discussed, and the patient expressed understanding of the given instructions. Patient is instructed to call or message via MyChart if he/she has any questions or concerns regarding our treatment plan. No barriers  to understanding were identified. We discussed Red Flag symptoms and signs in detail. Patient expressed understanding regarding what to do in case of urgent or emergency type symptoms.   Medication list was reconciled, printed and provided to the patient in AVS. Patient instructions and summary information was reviewed with the patient as documented in the AVS. This note was prepared with assistance of Dragon voice recognition software. Occasional wrong-word or sound-a-like substitutions may have occurred due to the inherent limitations of voice recognition software  This visit occurred during the SARS-CoV-2 public health emergency.  Safety protocols were in place, including screening questions prior to the visit, additional usage of staff PPE, and extensive cleaning of exam room while observing appropriate contact time as indicated for disinfecting solutions.

## 2020-01-15 DIAGNOSIS — Z1231 Encounter for screening mammogram for malignant neoplasm of breast: Secondary | ICD-10-CM | POA: Diagnosis not present

## 2020-01-24 DIAGNOSIS — Z79899 Other long term (current) drug therapy: Secondary | ICD-10-CM | POA: Diagnosis not present

## 2020-01-24 DIAGNOSIS — M792 Neuralgia and neuritis, unspecified: Secondary | ICD-10-CM | POA: Diagnosis not present

## 2020-01-24 DIAGNOSIS — G894 Chronic pain syndrome: Secondary | ICD-10-CM | POA: Diagnosis not present

## 2020-01-24 DIAGNOSIS — G629 Polyneuropathy, unspecified: Secondary | ICD-10-CM | POA: Diagnosis not present

## 2020-01-24 DIAGNOSIS — Z79891 Long term (current) use of opiate analgesic: Secondary | ICD-10-CM | POA: Diagnosis not present

## 2020-02-21 DIAGNOSIS — M792 Neuralgia and neuritis, unspecified: Secondary | ICD-10-CM | POA: Diagnosis not present

## 2020-02-21 DIAGNOSIS — G629 Polyneuropathy, unspecified: Secondary | ICD-10-CM | POA: Diagnosis not present

## 2020-02-21 DIAGNOSIS — G894 Chronic pain syndrome: Secondary | ICD-10-CM | POA: Diagnosis not present

## 2020-03-25 ENCOUNTER — Other Ambulatory Visit: Payer: Self-pay

## 2020-03-25 ENCOUNTER — Telehealth: Payer: Self-pay | Admitting: Family Medicine

## 2020-03-25 MED ORDER — METFORMIN HCL 1000 MG PO TABS: 1000.0000 mg | ORAL_TABLET | Freq: Two times a day (BID) | ORAL | 3 refills | Status: DC

## 2020-03-25 MED ORDER — GABAPENTIN 600 MG PO TABS: ORAL_TABLET | ORAL | 1 refills | Status: DC

## 2020-03-25 NOTE — Telephone Encounter (Signed)
MEDICATION:  gabapentin (NEURONTIN) 600 MG tablet  metFORMIN (GLUCOPHAGE) 1000 MG tablet  PHARMACY:  [id number] - [place], [place] - [id number]   Comments:  Patient will not be able to see her new PCP till July.     **Let patient know to contact pharmacy at the end of the day to make sure medication is ready. **  ** Please notify patient to allow 48-72 hours to process**  **Encourage patient to contact the pharmacy for refills or they can request refills through [facility]**

## 2020-03-25 NOTE — Telephone Encounter (Signed)
Script sent to pharmacy.

## 2020-03-27 DIAGNOSIS — M792 Neuralgia and neuritis, unspecified: Secondary | ICD-10-CM | POA: Diagnosis not present

## 2020-03-27 DIAGNOSIS — G629 Polyneuropathy, unspecified: Secondary | ICD-10-CM | POA: Diagnosis not present

## 2020-03-27 DIAGNOSIS — G894 Chronic pain syndrome: Secondary | ICD-10-CM | POA: Diagnosis not present

## 2020-03-27 DIAGNOSIS — Z79899 Other long term (current) drug therapy: Secondary | ICD-10-CM | POA: Diagnosis not present

## 2020-03-27 DIAGNOSIS — Z79891 Long term (current) use of opiate analgesic: Secondary | ICD-10-CM | POA: Diagnosis not present

## 2020-04-03 ENCOUNTER — Other Ambulatory Visit: Payer: Self-pay | Admitting: Family Medicine

## 2020-04-03 DIAGNOSIS — G894 Chronic pain syndrome: Secondary | ICD-10-CM | POA: Diagnosis not present

## 2020-04-03 DIAGNOSIS — Z79899 Other long term (current) drug therapy: Secondary | ICD-10-CM | POA: Diagnosis not present

## 2020-04-03 DIAGNOSIS — G629 Polyneuropathy, unspecified: Secondary | ICD-10-CM | POA: Diagnosis not present

## 2020-04-03 DIAGNOSIS — Z79891 Long term (current) use of opiate analgesic: Secondary | ICD-10-CM | POA: Diagnosis not present

## 2020-04-03 DIAGNOSIS — M792 Neuralgia and neuritis, unspecified: Secondary | ICD-10-CM | POA: Diagnosis not present

## 2020-04-23 ENCOUNTER — Telehealth: Payer: Self-pay | Admitting: Family Medicine

## 2020-04-23 NOTE — Telephone Encounter (Signed)
Patient has been scheduled

## 2020-04-23 NOTE — Telephone Encounter (Signed)
Patient is calling in this afternoon stating she has not left yet for [facility] and is wanting to follow up with Dr.Andy one more time before she goes, possibly wanting something before the 10th, but there is no openings, could we schedule for Same Day?

## 2020-04-23 NOTE — Telephone Encounter (Signed)
4:30 on 04/30/20 is the only available appt

## 2020-04-29 ENCOUNTER — Other Ambulatory Visit: Payer: Self-pay

## 2020-04-30 ENCOUNTER — Ambulatory Visit (INDEPENDENT_AMBULATORY_CARE_PROVIDER_SITE_OTHER): Payer: Medicare Other | Admitting: Family Medicine

## 2020-04-30 ENCOUNTER — Encounter: Payer: Self-pay | Admitting: Family Medicine

## 2020-04-30 VITALS — BP 112/70 | HR 85 | Temp 97.7°F | Resp 16 | Ht 66.0 in | Wt 209.6 lb

## 2020-04-30 DIAGNOSIS — Z5329 Procedure and treatment not carried out because of patient's decision for other reasons: Secondary | ICD-10-CM

## 2020-04-30 DIAGNOSIS — E1142 Type 2 diabetes mellitus with diabetic polyneuropathy: Secondary | ICD-10-CM | POA: Diagnosis not present

## 2020-04-30 DIAGNOSIS — I10 Essential (primary) hypertension: Secondary | ICD-10-CM | POA: Diagnosis not present

## 2020-04-30 DIAGNOSIS — F1190 Opioid use, unspecified, uncomplicated: Secondary | ICD-10-CM

## 2020-04-30 DIAGNOSIS — K219 Gastro-esophageal reflux disease without esophagitis: Secondary | ICD-10-CM

## 2020-04-30 DIAGNOSIS — E669 Obesity, unspecified: Secondary | ICD-10-CM

## 2020-04-30 DIAGNOSIS — Z5320 Procedure and treatment not carried out because of patient's decision for unspecified reasons: Secondary | ICD-10-CM

## 2020-04-30 LAB — POCT GLYCOSYLATED HEMOGLOBIN (HGB A1C): Hemoglobin A1C: 6.7 % — AB (ref 4.0–5.6)

## 2020-04-30 MED ORDER — PANTOPRAZOLE SODIUM 20 MG PO TBEC: 20.0000 mg | DELAYED_RELEASE_TABLET | Freq: Every day | ORAL | 1 refills | Status: DC

## 2020-04-30 NOTE — Progress Notes (Signed)
Subjective  CC:  Chief Complaint  Patient presents with  . Diabetes    HPI: Marilyn Myers is a 69 y.o. female who presents to the office today for follow up of diabetes and problems listed above in the chief complaint. Pt has returned from [place]; living with daughter did not work out. Looking for a condo; staying with a friend in the interim. Adjusting well.   Diabetes follow up: Her diabetic control is reported as Unchanged. Doing fine.  She denies exertional CP or SOB or symptomatic hypoglycemia. She denies foot sores or paresthesias.   GERD: having some breakthough sxs since decreasing pepcid back down to 20 from 40. Uses tums. Has been under increased stress.  HTN is controlled on meds.   Refusal of statin due to pain  Neuropathy: pain mgt increased norco to tid : "has changed my life".   Has gained some weight: will work on it.   Wt Readings from Last 3 Encounters:  04/30/20 209 lb 9.6 oz (95.1 kg)  01/07/20 208 lb 6.4 oz (94.5 kg)  12/10/19 206 lb (93.4 kg)    BP Readings from Last 3 Encounters:  04/30/20 112/70  01/07/20 132/70  12/10/19 126/72    Assessment  1. Controlled type 2 diabetes mellitus with diabetic polyneuropathy, without long-term current use of insulin ([place])   2. Essential hypertension   3. Gastroesophageal reflux disease without esophagitis   4. Chronic narcotic use   5. Refusal of statin medication by patient   6. Diabetic peripheral neuropathy (HCC)   7. Obesity (BMI 30-39.9)      Plan   Diabetes is currently very well controlled. No changes  HLD but statin intolerant   bp is good  Continue pepcid and increase to 40daily if needed. Refilled  cpe in 4 months. With labs  Follow up: Return in about 4 months (around 08/30/2020) for complete physical, follow up of diabetes and hypertension.. Orders Placed This Encounter  Procedures  . POCT HgB A1C   Meds ordered this encounter  Medications  . pantoprazole (PROTONIX) 20 MG tablet     Sig: Take 1-2 tablets (20-40 mg total) by mouth daily.    Dispense:  180 tablet    Refill:  1      Immunization History  Administered Date(s) Administered  . Fluad Quad(high Dose 65+) 08/27/2019  . Influenza, High Dose Seasonal PF 08/19/2014, 09/02/2015, 10/08/2016, 09/22/2017, 08/29/2018  . PFIZER SARS-COV-2 Vaccination 12/29/2019, 01/18/2020  . Pneumococcal Conjugate-13 10/18/2016  . Pneumococcal Polysaccharide-23 08/19/2014    Diabetes Related Lab Review: Lab Results  Component Value Date   HGBA1C 6.7 (A) 04/30/2020   HGBA1C 6.7 (A) 01/07/2020   HGBA1C 6.1 (A) 08/27/2019    No results found for: Derl Barrow Lab Results  Component Value Date   CREATININE 1.06 08/27/2019   BUN 23 08/27/2019   NA 137 08/27/2019   K 3.7 08/27/2019   CL 98 08/27/2019   CO2 31 08/27/2019   Lab Results  Component Value Date   CHOL 200 08/27/2019   CHOL 184 11/06/2018   Lab Results  Component Value Date   HDL 41.30 08/27/2019   HDL 40 11/06/2018   Lab Results  Component Value Date   LDLCALC 87 11/06/2018   Lab Results  Component Value Date   TRIG 392.0 (H) 08/27/2019   TRIG 284 (A) 11/06/2018   Lab Results  Component Value Date   CHOLHDL 5 08/27/2019   Lab Results  Component Value Date  LDLDIRECT 115.0 08/27/2019   The 10-year ASCVD risk score Mikey Bussing DC Jr., et al., 2013) is: 20.8%   Values used to calculate the score:     Age: 50 years     Sex: Female     Is Non-Hispanic African American: No     Diabetic: Yes     Tobacco smoker: No     Systolic Blood Pressure: 244 mmHg     Is BP treated: Yes     HDL Cholesterol: 41.3 mg/dL     Total Cholesterol: 200 mg/dL I have reviewed the PMH, Fam and Soc history. Patient Active Problem List   Diagnosis Date Noted  . Gastroesophageal reflux disease without esophagitis 01/07/2020  . Chronic narcotic use 01/07/2020  . Obesity (BMI 30-39.9) 02/22/2019  . Refusal of statin medication by patient 01/24/2018     Husband has problems with this medication for her   . Seasonal allergic rhinitis 01/24/2018  . PVC's (premature ventricular contractions) 10/08/2016  . Diabetic peripheral neuropathy ([place]) 09/28/2016    Failed lyrica   . S/P colostomy takedown 07/02/2015  . Osteopenia 05/19/2015    Overview:  T = -1.4 Dexa 2016.   [name] Kitchen Essential hypertension 01/28/2015  . Diverticulitis of large intestine 08/19/2014    Overview:  2015   . Hydradenitis 08/19/2014  . Nephrolithiasis 08/19/2014    Overview:  Dr. Kellie Simmering   . Obstructive sleep apnea 08/01/2014  . Controlled type 2 diabetes mellitus with diabetic polyneuropathy, without long-term current use of insulin ([place]) 06/10/2014    Social History: Patient  reports that she quit smoking about 23 years ago. Her smoking use included cigarettes. She has a 25.00 pack-year smoking history. She has never used smokeless tobacco. She reports current alcohol use. She reports that she does not use drugs.  Review of Systems: Ophthalmic: negative for eye pain, loss of vision or double vision Cardiovascular: negative for chest pain Respiratory: negative for SOB or persistent cough Gastrointestinal: negative for abdominal pain Genitourinary: negative for dysuria or gross hematuria MSK: negative for foot lesions Neurologic: negative for weakness or gait disturbance  Objective  Vitals: BP 112/70   Pulse 85   Temp 97.7 F (36.5 C) (Temporal)   Resp 16   Ht 5\' 6"  (1.676 m)   Wt 209 lb 9.6 oz (95.1 kg)   SpO2 98%   BMI 33.83 kg/m  General: well appearing, no acute distress  Psych:  Alert and oriented, normal mood and affect HEENT:  Normocephalic, atraumatic, moist mucous membranes, supple neck  Cardiovascular:  Nl S1 and S2, RRR without murmur, gallop or rub. no edema Respiratory:  Good breath sounds bilaterally, CTAB with normal effort, no rales Gastrointestinal: normal BS, soft, nontender Skin:  Warm, no rashes Neurologic:   Mental status is normal.  normal gait Foot exam: no erythema, pallor, or cyanosis visible nl proprioception and sensation to monofilament testing bilaterally, +2 distal pulses bilaterally    Diabetic education: ongoing education regarding chronic disease management for diabetes was given today. We continue to reinforce the ABC's of diabetic management: A1c (<7 or 8 dependent upon patient), tight blood pressure control, and cholesterol management with goal LDL < 100 minimally. We discuss diet strategies, exercise recommendations, medication options and possible side effects. At each visit, we review recommended immunizations and preventive care recommendations for diabetics and stress that good diabetic control can prevent other problems. See below for this patient's data.    Commons side effects, risks, benefits, and alternatives for medications and treatment plan prescribed  today were discussed, and the patient expressed understanding of the given instructions. Patient is instructed to call or message via MyChart if he/she has any questions or concerns regarding our treatment plan. No barriers to understanding were identified. We discussed Red Flag symptoms and signs in detail. Patient expressed understanding regarding what to do in case of urgent or emergency type symptoms.   Medication list was reconciled, printed and provided to the patient in AVS. Patient instructions and summary information was reviewed with the patient as documented in the AVS. This note was prepared with assistance of Dragon voice recognition software. Occasional wrong-word or sound-a-like substitutions may have occurred due to the inherent limitations of voice recognition software  This visit occurred during the SARS-CoV-2 public health emergency.  Safety protocols were in place, including screening questions prior to the visit, additional usage of staff PPE, and extensive cleaning of exam room while observing appropriate contact time as indicated for  disinfecting solutions.

## 2020-04-30 NOTE — Patient Instructions (Signed)
Please return in 3-4 months for your annual complete physical; please come fasting. And diabetes follow up.  I've refilled your protonix for your GERD. Your other medications look like they are good for now.   If you have any questions or concerns, please don't hesitate to send me a message via MyChart or call the office at [phone]. Thank you for visiting with [place] today! It's our pleasure caring for you.

## 2020-05-01 DIAGNOSIS — G629 Polyneuropathy, unspecified: Secondary | ICD-10-CM | POA: Diagnosis not present

## 2020-05-01 DIAGNOSIS — M792 Neuralgia and neuritis, unspecified: Secondary | ICD-10-CM | POA: Diagnosis not present

## 2020-05-01 DIAGNOSIS — G894 Chronic pain syndrome: Secondary | ICD-10-CM | POA: Diagnosis not present

## 2020-06-11 ENCOUNTER — Ambulatory Visit (INDEPENDENT_AMBULATORY_CARE_PROVIDER_SITE_OTHER): Payer: Medicare Other | Admitting: Physician Assistant

## 2020-06-11 ENCOUNTER — Encounter: Payer: Self-pay | Admitting: Physician Assistant

## 2020-06-11 ENCOUNTER — Telehealth: Payer: Self-pay

## 2020-06-11 ENCOUNTER — Other Ambulatory Visit: Payer: Self-pay

## 2020-06-11 VITALS — BP 126/70 | HR 87 | Temp 97.9°F | Ht 66.0 in | Wt 208.0 lb

## 2020-06-11 DIAGNOSIS — G8929 Other chronic pain: Secondary | ICD-10-CM

## 2020-06-11 MED ORDER — KETOROLAC TROMETHAMINE 60 MG/2ML IM SOLN
60.0000 mg | Freq: Once | INTRAMUSCULAR | Status: AC
  Administered 2020-06-11: 60 mg via INTRAMUSCULAR

## 2020-06-11 NOTE — Telephone Encounter (Signed)
FYI   Spoke with patient, states pain in hands and feet are worsening. Patient states she is unable to sleep/function. She is fearful that Gabapentin is causing memory loss. Expressed interest in restarting Tegretol. Aware PCP is out of office, requesting to be seen ASAP. Scheduled with Inda Coke, PA this afternoon.

## 2020-06-11 NOTE — Patient Instructions (Signed)
It was great to see you!  We gave you a toradol injection today for your pain  May take an additional 500 mg extra strength tylenol two times daily.  Please follow-up with PCP and pain management.  Take care,  Inda Coke PA-C

## 2020-06-11 NOTE — Telephone Encounter (Signed)
Pt called and stated her pain medication is no longer working and that she would like to get off her Gabapentin 600 mg, because she believes it is causing memory issues. However, she would like to talk to Dr. Jonni Sanger or team about potentially starting a new medication for her pain.

## 2020-06-11 NOTE — Progress Notes (Signed)
Marilyn Myers is a 69 y.o. female here for a follow up of a pre-existing problem.  I acted as a Education administrator for [employer], PA-C Anselmo Pickler, LPN   History of Present Illness:   Chief Complaint  Patient presents with  . Bilateral hand and foot pain    HPI   Hand & Foot pain Pt c/o bilateral increased hand and foot pain x 4 days, and states that her pain medication is not helping. She sees pain mgmt and states that her pain doctor is interested in starting some sort of electrical stimulation for her pain. She doesn't take oral ibuprofen due to history of gastric sleeve surgery.  Has been on 600 mg gabapentin QID for "years." Currently doing 600 mg BID -- states that over the past few months she has tapered herself to this because she has noticed brain fog and thought this was causing it.  15-20 years ago had trigeminal neuralgia and took tegretol. She is interested in possibly switching to this.  Hx of gastric sleeve in 2015 (per patient).  Past Medical History:  Diagnosis Date  . Anxiety   . Diverticulitis of intestine without perforation or abscess   . Diverticulosis, sigmoid   . GERD (gastroesophageal reflux disease)   . History of diverticulitis of colon    09/ 2015  RESOLVED   . History of kidney stones   . Hypertension   . Nephrolithiasis    BILATERAL  . OSA on CPAP    wears cpap  . Right ureteral stone   . Seasonal allergic rhinitis 01/24/2018  . Type 2 diabetes mellitus (HCC)      Social History   Tobacco Use  . Smoking status: Former Smoker    Packs/day: 1.00    Years: 25.00    Pack years: 25.00    Types: Cigarettes    Quit date: 10/07/1996    Years since quitting: 23.6  . Smokeless tobacco: Never Used  Vaping Use  . Vaping Use: Never used  Substance Use Topics  . Alcohol use: Yes    Comment: rare  . Drug use: No    Past Surgical History:  Procedure Laterality Date  . BARIATRIC SURGERY    . CERVICAL CONE BIOPSY  1976  . COLON RESECTION      colostomy x 6 months  . CYSTO/  BILATERAL RETROGRADE PYELOGRAM/  PLACEMENT BILATERAL URETERAL STENTS  07-10-2011  . CYSTOSCOPY WITH RETROGRADE PYELOGRAM, URETEROSCOPY AND STENT PLACEMENT Right 10/11/2014   Procedure: CYSTOSCOPY WITH RETROGRADE PYELOGRAM, URETEROSCOPY, right ureteral biopsy, AND STENT PLACEMENT;  Surgeon: Arvil Persons, MD;  [facility];  Service: Urology;  Laterality: Right;  . CYSTOSCOPY/RETROGRADE/URETEROSCOPY  10/08/2011   Procedure: CYSTOSCOPY/RETROGRADE/URETEROSCOPY;  Surgeon: Hanley Ben, MD;  Location[facility];  Service: Urology;  Laterality: N/A;  CYSTOSCOPY, RIGHT  RETROGRADE, RIGHT URETEROSCOPY HOLMIUM LASER WITH BASKET STONE EXTRACTION AND RIGHT URETERAL STENT PLACEMENT  . EXTRACORPOREAL SHOCK WAVE LITHOTRIPSY  x3  last one 01-24-2012  . TUBAL LIGATION  1987  . URETEROSCPIC STONE EXTRACTION/ STENT PLACEMENT  left 08-20-2011    Family History  Problem Relation Age of Onset  . Breast cancer Mother        46  . Renal Disease Mother   . Heart disease Mother   . Diabetes Paternal Grandmother   . Diabetes Maternal Aunt     Allergies  Allergen Reactions  . Ace Inhibitors Cough  . Codeine Nausea And Vomiting  . Flomax [Tamsulosin Hcl]  Itching    Current Medications:   Current Outpatient Medications:  .  Biotin 5000 MCG CAPS, Take by mouth., Disp: , Rfl:  .  Calcium Carbonate-Vitamin D (CALCIUM-VITAMIN D3 PO), Take by mouth., Disp: , Rfl:  .  Cetirizine HCl (ZYRTEC ALLERGY PO), Take by mouth., Disp: , Rfl:  .  Cholecalciferol (VITAMIN D3) 1000 UNITS CAPS, Take 1 capsule by mouth daily., Disp: , Rfl:  .  Cyanocobalamin (VITAMIN B 12 PO), Take by mouth., Disp: , Rfl:  .  diltiazem (CARDIZEM CD) 120 MG 24 hr capsule, TAKE 1 CAPSULE BY MOUTH EVERY DAY, Disp: 90 capsule, Rfl: 3 .  FA-B6-B12-Omega 3-Phytosterols (BP VIT 3 PO), Take by mouth., Disp: , Rfl:  .  FIBER ADULT GUMMIES 2 g CHEW, Chew by mouth., Disp: , Rfl:  .   gabapentin (NEURONTIN) 600 MG tablet, TAKE 1 TABLET BY MOUTH 4 TIMES A DAY (Patient taking differently: Take 600 mg by mouth 2 (two) times daily. TAKE 1 TABLET BY MOUTH 4 TIMES A DAY), Disp: 360 tablet, Rfl: 1 .  hydrochlorothiazide (HYDRODIURIL) 25 MG tablet, Take 1 tablet (25 mg total) by mouth daily., Disp: 90 tablet, Rfl: 3 .  HYDROcodone-acetaminophen (NORCO) 10-325 MG tablet, TAKE 1 TABLET BY MOUTH EVERYDAY AT BEDTIME, Disp: , Rfl:  .  losartan (COZAAR) 100 MG tablet, Take 1 tablet (100 mg total) by mouth daily., Disp: 90 tablet, Rfl: 3 .  metFORMIN (GLUCOPHAGE) 1000 MG tablet, Take 1 tablet (1,000 mg total) by mouth 2 (two) times daily., Disp: 180 tablet, Rfl: 3 .  Multiple Vitamin (MULTIVITAMIN) capsule, Take by mouth., Disp: , Rfl:  .  pantoprazole (PROTONIX) 20 MG tablet, Take 1-2 tablets (20-40 mg total) by mouth daily., Disp: 180 tablet, Rfl: 1  Current Facility-Administered Medications:  .  ketorolac (TORADOL) injection 60 mg, 60 mg, Intramuscular, Once, [place], [place], [place]   Review of Systems:   ROS  Negative unless otherwise specified per HPI.  Vitals:   Vitals:   06/11/20 1411  BP: 126/70  Pulse: 87  Temp: 97.9 F (36.6 C)  TempSrc: Temporal  SpO2: 98%  Weight: 208 lb (94.3 kg)  Height: 5\' 6"  (1.676 m)     Body mass index is 33.57 kg/m.  Physical Exam:   Physical Exam Vitals and nursing note reviewed.  Constitutional:      General: She is not in acute distress.    Appearance: She is well-developed. She is not ill-appearing or toxic-appearing.  Cardiovascular:     Rate and Rhythm: Normal rate and regular rhythm.     Pulses: Normal pulses.     Heart sounds: Normal heart sounds, S1 normal and S2 normal.     Comments: No LE edema Pulmonary:     Effort: Pulmonary effort is normal.     Breath sounds: Normal breath sounds.  Skin:    General: Skin is warm and dry.  Neurological:     Mental Status: She is alert.     GCS: GCS eye subscore is 4. GCS verbal  subscore is 5. GCS motor subscore is 6.  Psychiatric:        Speech: Speech normal.        Behavior: Behavior normal. Behavior is cooperative.     Results for orders placed or performed in visit on 04/30/20  POCT HgB A1C  Result Value Ref Range   Hemoglobin A1C 6.7 (A) 4.0 - 5.6 %    Assessment and Plan:   Audrina was seen today for bilateral hand  and foot pain.  Diagnoses and all orders for this visit:  Other chronic pain -     ketorolac (TORADOL) injection 60 mg   Uncontrolled. Offered 1 time toradol injection for inflammation. She is agreeable to this. Received in office and tolerated well. Recommended further changes to her pain regimen be with her pain management provider. Patient agreeable.  . Reviewed expectations re: course of current medical issues. . Discussed self-management of symptoms. . Outlined signs and symptoms indicating need for more acute intervention. . Patient verbalized understanding and all questions were answered. . See orders for this visit as documented in the electronic medical record. . Patient received an After-Visit Summary.  CMA or LPN served as scribe during this visit. History, Physical, and Plan performed by medical provider. The above documentation has been reviewed and is accurate and complete.  Inda Coke, PA-C

## 2020-06-20 ENCOUNTER — Telehealth: Payer: Self-pay | Admitting: Family Medicine

## 2020-06-20 NOTE — Progress Notes (Signed)
  Chronic Care Management   Outreach Note  06/20/2020 Name: Marilyn Myers MRN: [id number] DOB: 1951-02-22  Referred by: [name], MD Reason for referral : Chronic Care Management (INITIAL CCM OUTREACH)   An unsuccessful telephone outreach was attempted today. The patient was referred to the pharmacist for assistance with care management and care coordination.   Follow Up Plan:   Earney [place] [employer] Scheduler

## 2020-06-27 ENCOUNTER — Encounter: Payer: Self-pay | Admitting: Family Medicine

## 2020-06-27 ENCOUNTER — Other Ambulatory Visit: Payer: Self-pay

## 2020-06-27 ENCOUNTER — Telehealth: Payer: Self-pay | Admitting: Family Medicine

## 2020-06-27 ENCOUNTER — Ambulatory Visit (INDEPENDENT_AMBULATORY_CARE_PROVIDER_SITE_OTHER): Payer: Medicare Other | Admitting: Family Medicine

## 2020-06-27 VITALS — BP 130/68 | HR 81 | Temp 97.9°F | Resp 18 | Ht 66.0 in | Wt 206.6 lb

## 2020-06-27 DIAGNOSIS — F1190 Opioid use, unspecified, uncomplicated: Secondary | ICD-10-CM

## 2020-06-27 DIAGNOSIS — G8929 Other chronic pain: Secondary | ICD-10-CM

## 2020-06-27 DIAGNOSIS — E1142 Type 2 diabetes mellitus with diabetic polyneuropathy: Secondary | ICD-10-CM | POA: Diagnosis not present

## 2020-06-27 DIAGNOSIS — R413 Other amnesia: Secondary | ICD-10-CM

## 2020-06-27 NOTE — Telephone Encounter (Signed)
Patient stated she needed to make a medicare wellness visit but had one on 12/20/19. So I scheduled her for jan 2022 for her wellness visit

## 2020-06-27 NOTE — Patient Instructions (Signed)
Please schedule annual wellness visit with our nurse educator so we can test your memory.  Please follow up as scheduled for your next visit with me: 10/03/2020 - this is for your complete physical with me.  Please ask your pain specialists if a longer acting pain medication is an option.  We will discuss with the pharmacist the effects of gabapentin and use of tegretol.   If you have any questions or concerns, please don't hesitate to send me a message via MyChart or call the office at [phone]. Thank you for visiting with [place] today! It's our pleasure caring for you.

## 2020-06-27 NOTE — Progress Notes (Signed)
Subjective  CC:  Chief Complaint  Patient presents with  . bilateral hand and foot pain    patient concerned over memory loss that may be caused by Gabapentin, she pain has gotten better,     HPI: ADALYND DONAHOE is a 69 y.o. female who presents to the office today to address the problems listed above in the chief complaint.  69 year old female with diabetes and severe diabetic peripheral neuropathy/lumbar neuritis.  She is currently managed on high-dose gabapentin and chronic hydrocodone per pain management.  She is noticing that at times she has a little bit more trouble than usual recalling certain things.  She soon will be moving into a townhome and will be living independently.  She is widowed.  She is anxious about making sure she will be stable and independent.  She has read that gabapentin can affect her memory.  She has read that Tegretol can be used for peripheral neuropathy.  She would like to know if she can be changed.  She  has not has seen a neurologist for seen or consultation.  She is currently using her hydrocodone but only last 4 hours.  She will alternate hydrocodone and gabapentin for pain relief.  Her anxiety is due to a recent pain flare that lasted 3 to 4 days.  I reviewed her most recent note she was treated with Toradol IM which was beneficial.  Assessment  1. Diabetic peripheral neuropathy (HCC)   2. Other chronic pain   3. Chronic narcotic use   4. Memory problem      Plan   Chronic pain due to peripheral neuropathy on chronic narcotics: Has upcoming appointment with clinical pharmacist.  Will ask his opinion regarding memory changes due to gabapentin versus chronic narcotics.  Recommend annual wellness visit for memory screening.  She has been normal in the past.  Will ask pharmacist opinion regarding Tegretol use for peripheral neuropathy.  Could consider neurology appointment.  Recommend patient follow-up with pain management and discussed use of longer acting  pain medication.  She will follow-up with me after we get more data and decide what to do.  Patient stands and agrees with care plan  Follow up: Return for AWV at patient's convenience, as scheduled.  07/08/2020  No orders of the defined types were placed in this encounter.  No orders of the defined types were placed in this encounter.     I reviewed the patients updated PMH, FH, and SocHx.    Patient Active Problem List   Diagnosis Date Noted  . Gastroesophageal reflux disease without esophagitis 01/07/2020  . Chronic narcotic use 01/07/2020  . Obesity (BMI 30-39.9) 02/22/2019  . Refusal of statin medication by patient 01/24/2018  . Seasonal allergic rhinitis 01/24/2018  . PVC's (premature ventricular contractions) 10/08/2016  . Diabetic peripheral neuropathy ([place]) 09/28/2016  . S/P colostomy takedown 07/02/2015  . Osteopenia 05/19/2015  . Essential hypertension 01/28/2015  . Diverticulitis of large intestine 08/19/2014  . Hydradenitis 08/19/2014  . Nephrolithiasis 08/19/2014  . Obstructive sleep apnea 08/01/2014  . Controlled type 2 diabetes mellitus with diabetic polyneuropathy, without long-term current use of insulin ([place]) 06/10/2014   Current Meds  Medication Sig  . Biotin 5000 MCG CAPS Take by mouth.  . Calcium Carbonate-Vitamin D (CALCIUM-VITAMIN D3 PO) Take by mouth.  . Cholecalciferol (VITAMIN D3) 1000 UNITS CAPS Take 1 capsule by mouth daily.  . Cyanocobalamin (VITAMIN B 12 PO) Take by mouth.  . diltiazem (CARDIZEM CD) 120 MG 24 hr  capsule TAKE 1 CAPSULE BY MOUTH EVERY DAY  . FA-B6-B12-Omega 3-Phytosterols (BP VIT 3 PO) Take by mouth.  . FIBER ADULT GUMMIES 2 g CHEW Chew by mouth.  . gabapentin (NEURONTIN) 600 MG tablet TAKE 1 TABLET BY MOUTH 4 TIMES A DAY (Patient taking differently: Take 600 mg by mouth 3 (three) times daily. )  . hydrochlorothiazide (HYDRODIURIL) 25 MG tablet Take 1 tablet (25 mg total) by mouth daily.  [name] Kitchen HYDROcodone-acetaminophen (NORCO) 10-325  MG tablet Take 1 tablet by mouth in the morning, at noon, and at bedtime.   [name] Kitchen losartan (COZAAR) 100 MG tablet Take 1 tablet (100 mg total) by mouth daily.  . metFORMIN (GLUCOPHAGE) 1000 MG tablet Take 1 tablet (1,000 mg total) by mouth 2 (two) times daily.  . Multiple Vitamin (MULTIVITAMIN) capsule Take by mouth.  . pantoprazole (PROTONIX) 20 MG tablet Take 1-2 tablets (20-40 mg total) by mouth daily.    Allergies: Patient is allergic to ace inhibitors, codeine, and flomax [tamsulosin hcl]. Family History: Patient family history includes Breast cancer in her mother; Diabetes in her maternal aunt and paternal grandmother; Heart disease in her mother; Renal Disease in her mother. Social History:  Patient  reports that she quit smoking about 23 years ago. Her smoking use included cigarettes. She has a 25.00 pack-year smoking history. She has never used smokeless tobacco. She reports current alcohol use. She reports that she does not use drugs.  Review of Systems: Constitutional: Negative for fever malaise or anorexia Cardiovascular: negative for chest pain Respiratory: negative for SOB or persistent cough Gastrointestinal: negative for abdominal pain  Objective  Vitals: BP 130/68   Pulse 81   Temp 97.9 F (36.6 C) (Temporal)   Resp 18   Ht 5\' 6"  (1.676 m)   Wt 206 lb 9.6 oz (93.7 kg)   SpO2 98%   BMI 33.35 kg/m  General: no acute distress , A&Ox3   Commons side effects, risks, benefits, and alternatives for medications and treatment plan prescribed today were discussed, and the patient expressed understanding of the given instructions. Patient is instructed to call or message via MyChart if he/she has any questions or concerns regarding our treatment plan. No barriers to understanding were identified. We discussed Red Flag symptoms and signs in detail. Patient expressed understanding regarding what to do in case of urgent or emergency type symptoms.   Medication list was reconciled,  printed and provided to the patient in AVS. Patient instructions and summary information was reviewed with the patient as documented in the AVS. This note was prepared with assistance of Dragon voice recognition software. Occasional wrong-word or sound-a-like substitutions may have occurred due to the inherent limitations of voice recognition software  This visit occurred during the SARS-CoV-2 public health emergency.  Safety protocols were in place, including screening questions prior to the visit, additional usage of staff PPE, and extensive cleaning of exam room while observing appropriate contact time as indicated for disinfecting solutions.

## 2020-07-02 DIAGNOSIS — G894 Chronic pain syndrome: Secondary | ICD-10-CM | POA: Diagnosis not present

## 2020-07-02 DIAGNOSIS — M79671 Pain in right foot: Secondary | ICD-10-CM | POA: Diagnosis not present

## 2020-07-02 DIAGNOSIS — G629 Polyneuropathy, unspecified: Secondary | ICD-10-CM | POA: Diagnosis not present

## 2020-07-02 DIAGNOSIS — R200 Anesthesia of skin: Secondary | ICD-10-CM | POA: Diagnosis not present

## 2020-07-08 ENCOUNTER — Ambulatory Visit: Payer: Medicare Other

## 2020-07-08 DIAGNOSIS — E1142 Type 2 diabetes mellitus with diabetic polyneuropathy: Secondary | ICD-10-CM

## 2020-07-08 DIAGNOSIS — I10 Essential (primary) hypertension: Secondary | ICD-10-CM

## 2020-07-08 DIAGNOSIS — K219 Gastro-esophageal reflux disease without esophagitis: Secondary | ICD-10-CM

## 2020-07-08 NOTE — Progress Notes (Signed)
Chronic Care Management Pharmacy  Name: Marilyn Myers  MRN: [id number] DOB: [birth year]  Chief Complaint/ HPI  Marilyn Myers,  69 y.o. , female presents for their Initial CCM visit with the clinical pharmacist via telephone due to COVID-19 Pandemic.  Patient has decided to stay on current dose of gabapentin and avoid tegretol after discussion PCP, pain mgmt, and RPH. Some side effects related to Tegretol use including dizziness/drowsiness, potential for rash, blood effects are concerning to patient.   Recently reduced gabapentin dose from 600 mg four times daily to 600 mg three times daily. She does not want to consider further dose reduction and feels comfortable with current management. She has not discussed ER opioid with pain mgmt and may consider further discussing in 3 months when she is next seen there.   Tolerating metformin well as long as it is taken with meals.  PCP : [name], MD  Chronic conditions include:  Encounter Diagnoses  Name Primary?  . Controlled type 2 diabetes mellitus with diabetic polyneuropathy, without long-term current use of insulin ([place]) Yes  . Essential hypertension   . Gastroesophageal reflux disease without esophagitis    Patient Active Problem List   Diagnosis Date Noted  . Gastroesophageal reflux disease without esophagitis 01/07/2020  . Chronic narcotic use 01/07/2020  . Obesity (BMI 30-39.9) 02/22/2019  . Refusal of statin medication by patient 01/24/2018  . Seasonal allergic rhinitis 01/24/2018  . PVC's (premature ventricular contractions) 10/08/2016  . Diabetic peripheral neuropathy ([place]) 09/28/2016  . S/P colostomy takedown 07/02/2015  . Osteopenia 05/19/2015  . Essential hypertension 01/28/2015  . Diverticulitis of large intestine 08/19/2014  . Hydradenitis 08/19/2014  . Nephrolithiasis 08/19/2014  . Obstructive sleep apnea 08/01/2014  . Controlled type 2 diabetes mellitus with diabetic polyneuropathy, without long-term  current use of insulin ([place]) 06/10/2014   Past Surgical History:  Procedure Laterality Date  . BARIATRIC SURGERY    . CERVICAL CONE BIOPSY  1976  . COLON RESECTION     colostomy x 6 months  . CYSTO/  BILATERAL RETROGRADE PYELOGRAM/  PLACEMENT BILATERAL URETERAL STENTS  07-10-2011  . CYSTOSCOPY WITH RETROGRADE PYELOGRAM, URETEROSCOPY AND STENT PLACEMENT Right 10/11/2014   Procedure: CYSTOSCOPY WITH RETROGRADE PYELOGRAM, URETEROSCOPY, right ureteral biopsy, AND STENT PLACEMENT;  Surgeon: Arvil Persons, MD;  Location: [facility];  Service: Urology;  Laterality: Right;  . CYSTOSCOPY/RETROGRADE/URETEROSCOPY  10/08/2011   Procedure: CYSTOSCOPY/RETROGRADE/URETEROSCOPY;  Surgeon: Hanley Ben, MD;  Location: [facility];  Service: Urology;  Laterality: N/A;  CYSTOSCOPY, RIGHT  RETROGRADE, RIGHT URETEROSCOPY HOLMIUM LASER WITH BASKET STONE EXTRACTION AND RIGHT URETERAL STENT PLACEMENT  . EXTRACORPOREAL SHOCK WAVE LITHOTRIPSY  x3  last one 01-24-2012  . TUBAL LIGATION  1987  . URETEROSCPIC STONE EXTRACTION/ STENT PLACEMENT  left 08-20-2011   Social History   Socioeconomic History  . Marital status: Widowed    Spouse name: Not on file  . Number of children: 3  . Years of education: Not on file  . Highest education level: Not on file  Occupational History  . Occupation: Retired   Tobacco Use  . Smoking status: Former Smoker    Packs/day: 1.00    Years: 25.00    Pack years: 25.00    Types: Cigarettes    Quit date: 10/07/1996    Years since quitting: 23.7  . Smokeless tobacco: Never Used  Vaping Use  . Vaping Use: Never used  Substance and Sexual Activity  . Alcohol use: Yes  Comment: rare  . Drug use: No  . Sexual activity: Never  Other Topics Concern  . Not on file  Social History Narrative   In process of selling home and moving to [place] with daughter   Social Determinants of Health   Financial Resource Strain:   . Difficulty of Paying  Living Expenses:   Food Insecurity: No Food Insecurity  . Worried About Charity fundraiser in the Last Year: Never true  . Ran Out of Food in the Last Year: Never true  Transportation Needs: No Transportation Needs  . Lack of Transportation (Medical): No  . Lack of Transportation (Non-Medical): No  Physical Activity:   . Days of Exercise per Week:   . Minutes of Exercise per Session:   Stress:   . Feeling of Stress :   Social Connections:   . Frequency of Communication with Friends and Family:   . Frequency of Social Gatherings with Friends and Family:   . Attends Religious Services:   . Active Member of Clubs or Organizations:   . Attends Archivist Meetings:   [name] Kitchen Marital Status:   SDOH (Social Determinants of Health) assessments performed: Yes. Family History  Problem Relation Age of Onset  . Breast cancer Mother        62  . Renal Disease Mother   . Heart disease Mother   . Diabetes Paternal Grandmother   . Diabetes Maternal Aunt    Allergies  Allergen Reactions  . Ace Inhibitors Cough  . Codeine Nausea And Vomiting  . Flomax [Tamsulosin Hcl] Itching   Outpatient Encounter Medications as of 07/08/2020  Medication Sig  . Biotin 5000 MCG CAPS Take by mouth.  . Calcium Carbonate-Vitamin D (CALCIUM-VITAMIN D3 PO) Take by mouth.  . Cholecalciferol (VITAMIN D3) 1000 UNITS CAPS Take 1 capsule by mouth daily.  . Cyanocobalamin (VITAMIN B 12 PO) Take by mouth.  . diltiazem (CARDIZEM CD) 120 MG 24 hr capsule TAKE 1 CAPSULE BY MOUTH EVERY DAY  . FA-B6-B12-Omega 3-Phytosterols (BP VIT 3 PO) Take by mouth.  . FIBER ADULT GUMMIES 2 g CHEW Chew by mouth.  . gabapentin (NEURONTIN) 600 MG tablet TAKE 1 TABLET BY MOUTH 4 TIMES A DAY (Patient taking differently: Take 600 mg by mouth 3 (three) times daily. )  . hydrochlorothiazide (HYDRODIURIL) 25 MG tablet Take 1 tablet (25 mg total) by mouth daily.  [name] Kitchen HYDROcodone-acetaminophen (NORCO) 10-325 MG tablet Take 1 tablet by mouth in  the morning, at noon, and at bedtime.   [name] Kitchen losartan (COZAAR) 100 MG tablet Take 1 tablet (100 mg total) by mouth daily.  . metFORMIN (GLUCOPHAGE) 1000 MG tablet Take 1 tablet (1,000 mg total) by mouth 2 (two) times daily.  . Multiple Vitamin (MULTIVITAMIN) capsule Take by mouth.  . pantoprazole (PROTONIX) 20 MG tablet Take 1-2 tablets (20-40 mg total) by mouth daily.  . Cetirizine HCl (ZYRTEC ALLERGY PO) Take by mouth. (Patient not taking: Reported on 06/27/2020)   No facility-administered encounter medications on file as of 07/08/2020.   Patient Care Team    Relationship Specialty Notifications Start End  [name], MD PCP - General Family Medicine  07/26/14   Heron Nay, MD  Surgery  12/01/17   Princess Bruins, MD Consulting Physician Obstetrics and Gynecology  12/01/17   Juluis Rainier  Optometry  08/27/19   Harriett Sine, MD Consulting Physician Dermatology  12/10/19   Roche, Christian Mate, PA-C Physician Assistant Pain Medicine  12/10/19   Madelin Rear,  [facility] Pharmacist Pharmacist  06/20/20    Comment: [phone]   Current Diagnosis/Assessment: Goals Addressed            This Visit's Progress   . PharmD Care Plan       CARE PLAN ENTRY (see longitudinal plan of care for additional care plan information)  Current Barriers:  . Chronic Disease Management support, education, and care coordination needs related to Hypertension, Hyperlipidemia, and Diabetes   Hypertension BP Readings from Last 3 Encounters:  06/27/20 130/68  06/11/20 126/70  04/30/20 112/70   . Pharmacist Clinical Goal(s): o Over the next 180 days, patient will work with PharmD and providers to maintain BP goal <130/80 . Current regimen:  . Losartan 100 mg once daily . Diltiazem 120 mg 120 mg once daily . Hydrochlorothiazide 25 mg once daily . Interventions: o Continue current management . Patient self care activities - Over the next 180 days, patient will: o Check BP at least once every 1-2 weeks,  document, and provide at future appointments o Ensure daily salt intake < 2300 mg/day  Hyperlipidemia Lab Results  Component Value Date/Time   LDLCALC 87 11/06/2018 12:00 AM   LDLDIRECT 115.0 08/27/2019 09:27 AM   . Pharmacist Clinical Goal(s): o Over the next 180 days, patient will work with PharmD and providers to achieve LDL goal < 100 . Current regimen:  o Continue current management . Interventions: o Diet counseling . Patient self care activities - Over the next 180 days, patient will: o Continue current management  Diabetes  Lab Results  Component Value Date/Time   HGBA1C 6.7 (A) 04/30/2020 04:44 PM   HGBA1C 6.7 (A) 01/07/2020 09:48 AM   HGBA1C 6.4 11/06/2018 12:00 AM   . Pharmacist Clinical Goal(s): o Over the next 180 days, patient will work with PharmD and providers to maintain A1c goal <7% . Current regimen:  o Metformin 1000 mg twice daily  . Interventions: o Continue current management . Patient self care activities - Over the next 180 days, patient will: o Check blood sugar as directed, document, and provide at future appointments o Contact provider with any episodes of hypoglycemia  GERD . Pharmacist Clinical Goal(s) o Over the next 180 days, patient will work with PharmD and providers to minimize acid reflux symptoms . Current regimen:  o Pantoprazole 20-40 mg daily . Interventions: o Discussed acid reflux food triggers . Patient self care activities - Over the next 180 days, patient will: o Continue current management with emphasis on food trigger avoidance  Medication management . Pharmacist Clinical Goal(s): o Over the next 180 days, patient will work with PharmD and providers to maintain optimal medication adherence . Current pharmacy: [facility] . Interventions o Comprehensive medication review performed. o Continue current medication management strategy . Patient self care activities - Over the next 180 days, patient will: o Take medications as  prescribed o Report any questions or concerns to PharmD and/or provider(s)  Initial goal documentation.      Hypertension   BP goal <130/80  BP Readings from Last 3 Encounters:  06/27/20 130/68  06/11/20 126/70  04/30/20 112/70   Patient checks BP at home infrequently. Patient home BP readings are ranging: n/a.  Patient is currently controlled on the following medications:  . Losartan 100 mg once daily . Diltiazem 120 mg 120 mg once daily . Hydrochlorothiazide 25 mg once daily  We discussed diet and exercise extensively.  Plan  Continue current medications and control with diet and exercise.  . Diabetes  A1c goal <7%  Lab Results  Component Value Date/Time   HGBA1C 6.7 (A) 04/30/2020 04:44 PM   HGBA1C 6.7 (A) 01/07/2020 09:48 AM   HGBA1C 6.4 11/06/2018 12:00 AM    Denies any GI effects. Not testing at home. Patient is currently controlled on the following medications:   Metformin 1000 mg twice daily  We discussed: diet and exercise extensively  Plan  Continue current medications and control with diet and exercise.  Hyperlipidemia/ASCVD Risk Reduction   LDL goal < 100  Lipid Panel     Component Value Date/Time   CHOL 200 08/27/2019 0927   TRIG 392.0 (H) 08/27/2019 0927   HDL 41.30 08/27/2019 0927   LDLCALC 87 11/06/2018 0000   LDLDIRECT 115.0 08/27/2019 0927    Hepatic Function Latest Ref Rng & Units 08/27/2019 11/06/2018 07/26/2014  Total Protein 6.0 - 8.3 g/dL 6.8 - 8.6(H)  Albumin 3.5 - 5.2 g/dL 3.9 - 4.3  AST 0 - 37 U/L _0 ALT 0 - 35 U/L _1 Alk Phosphatase 39 - 117 U/L 74 - 87  Total Bilirubin 0.2 - 1.2 mg/dL 0.8 - 1.0   Patient has failed these meds in past: statins (refusal/intolerance) Patient is currently uncontrolled on the following medications:  . No current medications   We discussed:  diet and exercise extensively.  Plan  Continue current medications.  Pain   Patient denies current pain, currently  taking:  Gabapentin 600 mg three times daily  Hydrocodone 10-325 mg - take 1 tablet by mouth in the morning, at noon, and at bedtime.   We discussed:  Reviewed side effects of pain medications at length. Counseled on appropriate use of each.   Plan  Continue current medications   OP Screening    Last DEXA Scan: 2019. Normal, low risk. Vit D, 25-Hydroxy  Date Value Ref Range Status  11/06/2018 43.9  Final    Patient is not a candidate for pharmacologic treatment. Patient has failed these meds in past: n/a. Patient is currently controlled on the following medications:  . Calcium-Vitamin d3 supplementation   We discussed:  Recommend [phone] units of vitamin D daily. Recommend 1200 mg of calcium daily from dietary and supplemental sources. Recommend weight-bearing and muscle strengthening exercises for building and maintaining bone density.  Plan  Continue current medications and control with diet and exercise.  Vaccines   Immunization History  Administered Date(s) Administered  . Fluad Quad(high Dose 65+) 08/27/2019  . Influenza, High Dose Seasonal PF 08/19/2014, 09/02/2015, 10/08/2016, 09/22/2017, 08/29/2018  . PFIZER SARS-COV-2 Vaccination 12/29/2019, 01/18/2020  . Pneumococcal Conjugate-13 10/18/2016  . Pneumococcal Polysaccharide-23 08/19/2014   Reviewed and discussed patient's vaccination history.  Reports recent tetanus shot at pharmacy, pt will contact pharmacy to have them send in record.  Due for Shingrix, has not received due to cost. Does not meet requirements for shingrix PAP. Patient states she is too proud to receive extra help.   Plan  Recommended patient receive shingrix vaccine if not cost prohibitive Ref[facility].   GERD   Patient is currently controlled on the following medications:  . Pantoprazole 20-40 mg daily  We discussed:  Acid reflux trigger avoidance at length.   Plan  Continue current medications and  control with diet.  Medication Management Coordination   [facility]ce[facility] medications [facility]:  [id number] [id number]: [phone]:  [phone]  Denies any issues with current medication management.   Plan  Continue current medication management strategy. ___________________________ Future Appointments  Date Time Provider [place]  10/03/2020 11:00 AM [name], MD [facility]  12/11/2020  3:15 PM [facility] HEALTH COACH [facility]  01/07/2021  1:00 PM [facility] CCM PHARMACIST [facility]   Visit follow-up:  . CMA follow-up: n/a. [name] Kitchen RPH follow-up: 6 month phone visit.  Madelin Rear, Pharm.D., BCGP Clinical Pharmacist [place] [phone]

## 2020-07-08 NOTE — Patient Instructions (Signed)
Please call me at [phone] (direct line) with any questions - thank you!  - Edyth Gunnels., Clinical Pharmacist  Goals Addressed            This Visit's Progress   . PharmD Care Plan       CARE PLAN ENTRY (see longitudinal plan of care for additional care plan information)  Current Barriers:  . Chronic Disease Management support, education, and care coordination needs related to GERD, Hypertension, Hyperlipidemia, and Diabetes   Hypertension BP Readings from Last 3 Encounters:  06/27/20 130/68  06/11/20 126/70  04/30/20 112/70   . Pharmacist Clinical Goal(s): o Over the next 180 days, patient will work with PharmD and providers to maintain BP goal <130/80 . Current regimen:  . Losartan 100 mg once daily . Diltiazem 120 mg 120 mg once daily . Hydrochlorothiazide 25 mg once daily . Interventions: o Continue current management . Patient self care activities - Over the next 180 days, patient will: o Check BP at least once every 1-2 weeks, document, and provide at future appointments o Ensure daily salt intake < 2300 mg/day  Hyperlipidemia Lab Results  Component Value Date/Time   LDLCALC 87 11/06/2018 12:00 AM   LDLDIRECT 115.0 08/27/2019 09:27 AM   . Pharmacist Clinical Goal(s): o Over the next 180 days, patient will work with PharmD and providers to achieve LDL goal < 100 . Current regimen:  o Continue current management . Interventions: o Diet counseling . Patient self care activities - Over the next 180 days, patient will: o Continue current management  Diabetes  Lab Results  Component Value Date/Time   HGBA1C 6.7 (A) 04/30/2020 04:44 PM   HGBA1C 6.7 (A) 01/07/2020 09:48 AM   HGBA1C 6.4 11/06/2018 12:00 AM   . Pharmacist Clinical Goal(s): o Over the next 180 days, patient will work with PharmD and providers to maintain A1c goal <7% . Current regimen:  o Metformin 1000 mg twice daily  . Interventions: o Continue current management . Patient self care  activities - Over the next 180 days, patient will: o Check blood sugar as directed, document, and provide at future appointments o Contact provider with any episodes of hypoglycemia  GERD . Pharmacist Clinical Goal(s) o Over the next 180 days, patient will work with PharmD and providers to minimize acid reflux symptoms . Current regimen:  o Pantoprazole 20-40 mg daily . Interventions: o Discussed acid reflux food triggers . Patient self care activities - Over the next 180 days, patient will: o Continue current management with emphasis on food trigger avoidance  Medication management . Pharmacist Clinical Goal(s): o Over the next 180 days, patient will work with PharmD and providers to maintain optimal medication adherence . Current pharmacy: [facility] . Interventions o Comprehensive medication review performed. o Continue current medication management strategy . Patient self care activities - Over the next 180 days, patient will: o Take medications as prescribed o Report any questions or concerns to PharmD and/or provider(s)  Initial goal documentation.      Ms. Atkin was given information about Chronic Care Management services today including:  1. CCM service includes personalized support from designated clinical staff supervised by her physician, including individualized plan of care and coordination with other care providers 2. 24/7 contact phone numbers for assistance for urgent and routine care needs. 3. Standard insurance, coinsurance, copays and deductibles apply for chronic care management only during months in which we provide at least 20 minutes of these services. Most insurances cover these services at  100%, however patients may be responsible for any copay, coinsurance and/or deductible if applicable. This service may help you avoid the need for more expensive face-to-face services. 4. Only one practitioner may furnish and bill the service in a calendar month. 5. The patient  may stop CCM services at any time (effective at the end of the month) by phone call to the office staff.  Patient agreed to services and verbal consent obtained.   The patient verbalized understanding of instructions provided today and agreed to receive a mailed copy of patient instruction and/or educational materials. Telephone follow up appointment with pharmacy team member scheduled for: See next appointment with "Care Management Staff" under "What's Next" below.   Madelin Rear, Pharm.D., BCGP Clinical Pharmacist [facility] [place] [phone]    Diabetes Mellitus and Nutrition, Adult When you have diabetes (diabetes mellitus), it is very important to have healthy eating habits because your blood sugar (glucose) levels are greatly affected by what you eat and drink. Eating healthy foods in the appropriate amounts, at about the same times every day, can help you:  Control your blood glucose.  Lower your risk of heart disease.  Improve your blood pressure.  Reach or maintain a healthy weight. Every person with diabetes is different, and each person has different needs for a meal plan. Your health care provider may recommend that you work with a diet and nutrition specialist (dietitian) to make a meal plan that is best for you. Your meal plan may vary depending on factors such as:  The calories you need.  The medicines you take.  Your weight.  Your blood glucose, blood pressure, and cholesterol levels.  Your activity level.  Other health conditions you have, such as heart or kidney disease. How do carbohydrates affect me? Carbohydrates, also called carbs, affect your blood glucose level more than any other type of food. Eating carbs naturally raises the amount of glucose in your blood. Carb counting is a method for keeping track of how many carbs you eat. Counting carbs is important to keep your blood glucose at a healthy level,  especially if you use insulin or take certain oral diabetes medicines. It is important to know how many carbs you can safely have in each meal. This is different for every person. Your dietitian can help you calculate how many carbs you should have at each meal and for each snack. Foods that contain carbs include:  Bread, cereal, rice, pasta, and crackers.  Potatoes and corn.  Peas, beans, and lentils.  Milk and yogurt.  Fruit and juice.  Desserts, such as cakes, cookies, ice cream, and candy. How does alcohol affect me? Alcohol can cause a sudden decrease in blood glucose (hypoglycemia), especially if you use insulin or take certain oral diabetes medicines. Hypoglycemia can be a life-threatening condition. Symptoms of hypoglycemia (sleepiness, dizziness, and confusion) are similar to symptoms of having too much alcohol. If your health care provider says that alcohol is safe for you, follow these guidelines:  Limit alcohol intake to no more than 1 drink per day for nonpregnant women and 2 drinks per day for men. One drink equals 12 oz of beer, 5 oz of wine, or 1 oz of hard liquor.  Do not drink on an empty stomach.  Keep yourself hydrated with water, diet soda, or unsweetened iced tea.  Keep in mind that regular soda, juice, and other mixers may contain a lot of sugar and must be counted as carbs.  What are tips for following this plan?  Reading food labels  Start by checking the serving size on the "Nutrition Facts" label of packaged foods and drinks. The amount of calories, carbs, fats, and other nutrients listed on the label is based on one serving of the item. Many items contain more than one serving per package.  Check the total grams (g) of carbs in one serving. You can calculate the number of servings of carbs in one serving by dividing the total carbs by 15. For example, if a food has 30 g of total carbs, it would be equal to 2 servings of carbs.  Check the number of grams  (g) of saturated and trans fats in one serving. Choose foods that have low or no amount of these fats.  Check the number of milligrams (mg) of salt (sodium) in one serving. Most people should limit total sodium intake to less than 2,300 mg per day.  Always check the nutrition information of foods labeled as "low-fat" or "nonfat". These foods may be higher in added sugar or refined carbs and should be avoided.  Talk to your dietitian to identify your daily goals for nutrients listed on the label. Shopping  Avoid buying canned, premade, or processed foods. These foods tend to be high in fat, sodium, and added sugar.  Shop around the outside edge of the grocery store. This includes fresh fruits and vegetables, bulk grains, fresh meats, and fresh dairy. Cooking  Use low-heat cooking methods, such as baking, instead of high-heat cooking methods like deep frying.  Cook using healthy oils, such as olive, canola, or sunflower oil.  Avoid cooking with butter, cream, or high-fat meats. Meal planning  Eat meals and snacks regularly, preferably at the same times every day. Avoid going long periods of time without eating.  Eat foods high in fiber, such as fresh fruits, vegetables, beans, and whole grains. Talk to your dietitian about how many servings of carbs you can eat at each meal.  Eat 4-6 ounces (oz) of lean protein each day, such as lean meat, chicken, fish, eggs, or tofu. One oz of lean protein is equal to: ? 1 oz of meat, chicken, or fish. ? 1 egg. ?  cup of tofu.  Eat some foods each day that contain healthy fats, such as avocado, nuts, seeds, and fish. Lifestyle  Check your blood glucose regularly.  Exercise regularly as told by your health care provider. This may include: ? 150 minutes of moderate-intensity or vigorous-intensity exercise each week. This could be brisk walking, biking, or water aerobics. ? Stretching and doing strength exercises, such as yoga or weightlifting, at  least 2 times a week.  Take medicines as told by your health care provider.  Do not use any products that contain nicotine or tobacco, such as cigarettes and e-cigarettes. If you need help quitting, ask your health care provider.  Work with a Social worker or diabetes educator to identify strategies to manage stress and any emotional and social challenges. Questions to ask a health care provider  Do I need to meet with a diabetes educator?  Do I need to meet with a dietitian?  What number can I call if I have questions?  When are the best times to check my blood glucose? Where to find more information:  American Diabetes Association: diabetes.org  Academy of Nutrition and Dietetics: www.eatright.[employer] of Diabetes and Digestive and Kidney Diseases (NIH): [employer] Summary  A healthy meal plan will help you  control your blood glucose and maintain a healthy lifestyle.  Working with a diet and nutrition specialist (dietitian) can help you make a meal plan that is best for you.  Keep in mind that carbohydrates (carbs) and alcohol have immediate effects on your blood glucose levels. It is important to count carbs and to use alcohol carefully. This information is not intended to replace advice given to you by your health care provider. Make sure you discuss any questions you have with your health care provider. Document Revised: 10/21/2017 Document Reviewed: 12/13/2016 Elsevier Patient Education  2020 [employer].

## 2020-07-11 ENCOUNTER — Telehealth: Payer: Self-pay

## 2020-08-04 ENCOUNTER — Other Ambulatory Visit: Payer: Self-pay | Admitting: Family Medicine

## 2020-08-12 DIAGNOSIS — E119 Type 2 diabetes mellitus without complications: Secondary | ICD-10-CM | POA: Diagnosis not present

## 2020-08-27 DIAGNOSIS — Z79899 Other long term (current) drug therapy: Secondary | ICD-10-CM | POA: Diagnosis not present

## 2020-08-27 DIAGNOSIS — M79671 Pain in right foot: Secondary | ICD-10-CM | POA: Diagnosis not present

## 2020-08-27 DIAGNOSIS — Z79891 Long term (current) use of opiate analgesic: Secondary | ICD-10-CM | POA: Diagnosis not present

## 2020-08-27 DIAGNOSIS — R200 Anesthesia of skin: Secondary | ICD-10-CM | POA: Diagnosis not present

## 2020-08-27 DIAGNOSIS — G629 Polyneuropathy, unspecified: Secondary | ICD-10-CM | POA: Diagnosis not present

## 2020-08-27 DIAGNOSIS — G894 Chronic pain syndrome: Secondary | ICD-10-CM | POA: Diagnosis not present

## 2020-10-03 ENCOUNTER — Encounter: Payer: Medicare Other | Admitting: Family Medicine

## 2020-10-08 ENCOUNTER — Encounter: Payer: Medicare Other | Admitting: Family Medicine

## 2020-10-09 ENCOUNTER — Other Ambulatory Visit: Payer: Self-pay

## 2020-10-09 ENCOUNTER — Telehealth (INDEPENDENT_AMBULATORY_CARE_PROVIDER_SITE_OTHER): Payer: Medicare Other | Admitting: Family Medicine

## 2020-10-09 DIAGNOSIS — R0981 Nasal congestion: Secondary | ICD-10-CM | POA: Diagnosis not present

## 2020-10-09 DIAGNOSIS — R519 Headache, unspecified: Secondary | ICD-10-CM | POA: Diagnosis not present

## 2020-10-09 MED ORDER — AMOXICILLIN-POT CLAVULANATE 875-125 MG PO TABS: 1.0000 | ORAL_TABLET | Freq: Two times a day (BID) | ORAL | 0 refills | Status: DC

## 2020-10-09 NOTE — Progress Notes (Signed)
Virtual Visit via Telephone Note  I connected with Marilyn Myers on 10/09/20 at  3:00 PM EST by telephone and verified that I am speaking with the correct person using two identifiers.   I discussed the limitations, risks, security and privacy concerns of performing an evaluation and management service by telephone and the availability of in person appointments. I also discussed with the patient that there may be a patient responsible charge related to this service. The patient expressed understanding and agreed to proceed.  Location patient: home, [place] Location provider: work or home office Participants present for the call: patient, provider Patient did not have a visit with me in the prior 7 days to address this/these issue(s).   History of Present Illness:  Acute telemedicine visit for Sinus Issues: -Onset: for 1-2 weeks, but has chronic sinus congestion -Symptoms include: started with nasal congestion, sinus issues but now some facial pain in the L maxillary, whitish nasal discharge -Denies: fevers, CP, SOB, NVD, body aches -no known sick contact -Has tried:nothing -Pertinent past medical history: -Pertinent medication allergies:  -COVID-19 vaccine status:fully vaccinated and reports had he flu shot   Observations/Objective: Patient sounds cheerful and well on the phone. I do not appreciate any SOB. Speech and thought processing are grossly intact. Patient reported vitals:  Assessment and Plan:  Nasal congestion  Facial pain  -we discussed possible serious and likely etiologies, options for evaluation and workup, limitations of telemedicine visit vs in person visit, treatment, treatment risks and precautions. Pt prefers to treat via telemedicine empirically rather than in person at this moment.  Possible bacterial sinusitis given chronic underlying congestion and worsening over the last week or so now with sinus pain and discolored discharge.  She opted to try empiric  treatment with Augmentin 875 twice daily for 7 to 10 days.  Also discussed nasal saline, humidifier, etc. and the possibility of acute respiratory illness.  Advised to seek prompt in person care if worsening, new symptoms arise, or if is not improving with treatment. Advised of options for inperson care in case PCP office not available. Did let the patient know that I only do telemedicine shifts for [facility] on Tuesdays and Thursdays and advised a follow up visit with PCP or at an [facility] if has further questions or concerns.   Follow Up Instructions:  I did not refer this patient for an OV with me in the next 24 hours for this/these issue(s).  I discussed the assessment and treatment plan with the patient. The patient was provided an opportunity to ask questions and all were answered. The patient agreed with the plan and demonstrated an understanding of the instructions.   I spent 21 minutes on this encounter.   Lucretia Kern, DO

## 2020-10-09 NOTE — Patient Instructions (Signed)
-  I sent the medication(s) we discussed to your pharmacy: Meds ordered this encounter  Medications  . amoxicillin-clavulanate (AUGMENTIN) 875-125 MG tablet    Sig: Take 1 tablet by mouth 2 (two) times daily.    Dispense:  20 tablet    Refill:  0    Please stay home while sick.   I hope you are feeling better soon!  Seek in person care promptly if your symptoms worsen, new concerns arise or you are not improving with treatment.  It was nice to meet you today. I help [facility] out with telemedicine visits on Tuesdays and Thursdays and am available for visits on those days. If you have any concerns or questions following this visit please schedule a follow up visit with your Primary Care doctor or seek care at a local urgent care clinic to avoid delays in care.

## 2020-10-13 DIAGNOSIS — Z23 Encounter for immunization: Secondary | ICD-10-CM | POA: Diagnosis not present

## 2020-10-28 DIAGNOSIS — M792 Neuralgia and neuritis, unspecified: Secondary | ICD-10-CM | POA: Diagnosis not present

## 2020-10-28 DIAGNOSIS — G894 Chronic pain syndrome: Secondary | ICD-10-CM | POA: Diagnosis not present

## 2020-10-28 DIAGNOSIS — G629 Polyneuropathy, unspecified: Secondary | ICD-10-CM | POA: Diagnosis not present

## 2020-10-29 ENCOUNTER — Other Ambulatory Visit: Payer: Self-pay | Admitting: Family Medicine

## 2020-11-03 ENCOUNTER — Emergency Department (HOSPITAL_COMMUNITY)
Admission: EM | Admit: 2020-11-03 | Discharge: 2020-11-03 | Disposition: A | Discharge status: Home / Self Care | Payer: Medicare Other | Attending: Emergency Medicine | Admitting: Emergency Medicine

## 2020-11-03 ENCOUNTER — Emergency Department (HOSPITAL_COMMUNITY): Payer: Medicare Other

## 2020-11-03 ENCOUNTER — Other Ambulatory Visit: Payer: Self-pay

## 2020-11-03 DIAGNOSIS — Z794 Long term (current) use of insulin: Secondary | ICD-10-CM | POA: Insufficient documentation

## 2020-11-03 DIAGNOSIS — N201 Calculus of ureter: Secondary | ICD-10-CM

## 2020-11-03 DIAGNOSIS — I10 Essential (primary) hypertension: Secondary | ICD-10-CM | POA: Diagnosis not present

## 2020-11-03 DIAGNOSIS — Z79899 Other long term (current) drug therapy: Secondary | ICD-10-CM | POA: Diagnosis not present

## 2020-11-03 DIAGNOSIS — R1011 Right upper quadrant pain: Secondary | ICD-10-CM | POA: Diagnosis present

## 2020-11-03 DIAGNOSIS — R110 Nausea: Secondary | ICD-10-CM | POA: Diagnosis not present

## 2020-11-03 DIAGNOSIS — R8281 Pyuria: Secondary | ICD-10-CM | POA: Insufficient documentation

## 2020-11-03 DIAGNOSIS — Z87891 Personal history of nicotine dependence: Secondary | ICD-10-CM | POA: Insufficient documentation

## 2020-11-03 DIAGNOSIS — R1084 Generalized abdominal pain: Secondary | ICD-10-CM | POA: Diagnosis not present

## 2020-11-03 DIAGNOSIS — M792 Neuralgia and neuritis, unspecified: Secondary | ICD-10-CM | POA: Insufficient documentation

## 2020-11-03 DIAGNOSIS — R5381 Other malaise: Secondary | ICD-10-CM | POA: Diagnosis not present

## 2020-11-03 DIAGNOSIS — R112 Nausea with vomiting, unspecified: Secondary | ICD-10-CM | POA: Diagnosis not present

## 2020-11-03 DIAGNOSIS — K8020 Calculus of gallbladder without cholecystitis without obstruction: Secondary | ICD-10-CM

## 2020-11-03 DIAGNOSIS — E0842 Diabetes mellitus due to underlying condition with diabetic polyneuropathy: Secondary | ICD-10-CM | POA: Insufficient documentation

## 2020-11-03 DIAGNOSIS — R197 Diarrhea, unspecified: Secondary | ICD-10-CM | POA: Diagnosis not present

## 2020-11-03 DIAGNOSIS — Z95828 Presence of other vascular implants and grafts: Secondary | ICD-10-CM | POA: Diagnosis not present

## 2020-11-03 DIAGNOSIS — R7401 Elevation of levels of liver transaminase levels: Secondary | ICD-10-CM | POA: Diagnosis not present

## 2020-11-03 DIAGNOSIS — N202 Calculus of kidney with calculus of ureter: Secondary | ICD-10-CM | POA: Diagnosis not present

## 2020-11-03 LAB — CBC WITH DIFFERENTIAL/PLATELET
Abs Immature Granulocytes: 0.03 10*3/uL (ref 0.00–0.07)
Basophils Absolute: 0 10*3/uL (ref 0.0–0.1)
Basophils Relative: 0 %
Eosinophils Absolute: 0.3 10*3/uL (ref 0.0–0.5)
Eosinophils Relative: 3 %
HCT: 36.7 % (ref 36.0–46.0)
Hemoglobin: 11.7 g/dL — ABNORMAL LOW (ref 12.0–15.0)
Immature Granulocytes: 0 %
Lymphocytes Relative: 18 %
Lymphs Abs: 1.6 10*3/uL (ref 0.7–4.0)
MCH: 29.8 pg (ref 26.0–34.0)
MCHC: 31.9 g/dL (ref 30.0–36.0)
MCV: 93.4 fL (ref 80.0–100.0)
Monocytes Absolute: 0.7 10*3/uL (ref 0.1–1.0)
Monocytes Relative: 8 %
Neutro Abs: 6.3 10*3/uL (ref 1.7–7.7)
Neutrophils Relative %: 71 %
Platelets: 254 10*3/uL (ref 150–400)
RBC: 3.93 MIL/uL (ref 3.87–5.11)
RDW: 13.2 % (ref 11.5–15.5)
WBC: 9 10*3/uL (ref 4.0–10.5)
nRBC: 0 % (ref 0.0–0.2)

## 2020-11-03 LAB — COMPREHENSIVE METABOLIC PANEL
ALT: 59 U/L — ABNORMAL HIGH (ref 0–44)
AST: 194 U/L — ABNORMAL HIGH (ref 15–41)
Albumin: 3.4 g/dL — ABNORMAL LOW (ref 3.5–5.0)
Alkaline Phosphatase: 78 U/L (ref 38–126)
Anion gap: 12 (ref 5–15)
BUN: 20 mg/dL (ref 8–23)
CO2: 24 mmol/L (ref 22–32)
Calcium: 8.5 mg/dL — ABNORMAL LOW (ref 8.9–10.3)
Chloride: 101 mmol/L (ref 98–111)
Creatinine, Ser: 0.96 mg/dL (ref 0.44–1.00)
GFR, Estimated: >60 mL/min (ref >60)
Glucose, Bld: 191 mg/dL — ABNORMAL HIGH (ref 70–99)
Potassium: 3.3 mmol/L — ABNORMAL LOW (ref 3.5–5.1)
Sodium: 137 mmol/L (ref 135–145)
Total Bilirubin: 0.9 mg/dL (ref 0.3–1.2)
Total Protein: 6.9 g/dL (ref 6.5–8.1)

## 2020-11-03 LAB — URINALYSIS, ROUTINE W REFLEX MICROSCOPIC
Bacteria, UA: NONE SEEN
Bilirubin Urine: NEGATIVE
Glucose, UA: NEGATIVE mg/dL
Hgb urine dipstick: NEGATIVE
Ketones, ur: NEGATIVE mg/dL
Nitrite: NEGATIVE
Protein, ur: NEGATIVE mg/dL
Specific Gravity, Urine: >1.046 — ABNORMAL HIGH (ref 1.005–1.030)
WBC, UA: >50 WBC/hpf — ABNORMAL HIGH (ref 0–5)
pH: 5 (ref 5.0–8.0)

## 2020-11-03 LAB — LIPASE, BLOOD: Lipase: 58 U/L — ABNORMAL HIGH (ref 11–51)

## 2020-11-03 MED ORDER — SODIUM CHLORIDE (PF) 0.9 % IJ SOLN
INTRAMUSCULAR | Status: AC
  Filled 2020-11-03: qty 50

## 2020-11-03 MED ORDER — SODIUM CHLORIDE 0.9 % IV SOLN
2.0000 g | Freq: Once | INTRAVENOUS | Status: AC
  Administered 2020-11-03: 06:00:00 2 g via INTRAVENOUS
  Filled 2020-11-03: qty 20

## 2020-11-03 MED ORDER — CEPHALEXIN 500 MG PO CAPS: 500.0000 mg | ORAL_CAPSULE | Freq: Four times a day (QID) | ORAL | 0 refills | Status: AC

## 2020-11-03 MED ORDER — GABAPENTIN 300 MG PO CAPS
600.0000 mg | ORAL_CAPSULE | Freq: Once | ORAL | Status: AC
  Administered 2020-11-03: 05:00:00 600 mg via ORAL
  Filled 2020-11-03: qty 2

## 2020-11-03 MED ORDER — IOHEXOL 300 MG/ML  SOLN
100.0000 mL | Freq: Once | INTRAMUSCULAR | Status: AC | PRN
  Administered 2020-11-03: 03:00:00 100 mL via INTRAVENOUS

## 2020-11-03 MED ORDER — ONDANSETRON 4 MG PO TBDP: 4.0000 mg | ORAL_TABLET | Freq: Three times a day (TID) | ORAL | 0 refills | Status: DC | PRN

## 2020-11-03 NOTE — ED Triage Notes (Signed)
Patient here from home with c/o right sided abdominal pain for 2 days with n/v/d.  140/88,62,18,99% ra, cbg 167.

## 2020-11-03 NOTE — Discharge Instructions (Addendum)
Your found to have an obstructing kidney stone in your right ureter.  You have some white blood cells in your urine which may be suggestive of infection.  For this reason, you have been started on Keflex.  Continue Norco as needed for management of pain.  Call [facility] in the morning to schedule close follow-up in their office to ensure resolution of your obstructing stone.  You were also found to have gallstones without evidence of infection or irritation to your gallbladder.  You did have slight elevation of your liver tests.  These should be rechecked by your primary care doctor within the week.  Try to avoid fatty, fried, greasy foods as this may cause your stones to move around and cause you pain.  It would be beneficial to schedule an appointment with general surgery to discuss elective cholecystectomy (gallbladder removal surgery).  You may return to the emergency department for new or concerning symptoms.

## 2020-11-03 NOTE — ED Notes (Signed)
Pt stated she could not use the restroom at this time to provide a urine sample.

## 2020-11-03 NOTE — ED Provider Notes (Signed)
[facility] DEPT Provider Note   CSN: [id number] Arrival date & time: 11/03/20  0107     History Chief Complaint  Patient presents with  . Abdominal Pain    Marilyn Myers is a 69 y.o. female.  69 year old female with a history of hypertension, diabetes, anxiety presents to the emergency department for evaluation of abdominal pain.  Abdominal pain has been intermittent over the past 2 days.  It is primarily in her epigastrium and right upper quadrant, under her right breast.  Initially had symptoms after lunch yesterday.  Experienced nausea as well as vomiting and multiple episodes of watery diarrhea.  Describes her diarrhea to appear the consistency of "dirt".  Her symptoms spontaneously subsided and recurred this evening.  She took a tablet of Norco for her pain.  Reports the pain being more severe tonight with some localized distention to her upper abdomen making it uncomfortable for her to lean forward.  Episode tonight was not specifically postprandial.  No associated sick contacts, fevers, dysuria, hematuria, hematemesis, melena, hematochezia.  Presently pain-free.  Complicated abdominal SHx including colonic resection with colostomy and reversal, sleeve gastrectomy, tubal ligation.   Abdominal Pain      Past Medical History:  Diagnosis Date  . Anxiety   . Diverticulitis of intestine without perforation or abscess   . Diverticulosis, sigmoid   . GERD (gastroesophageal reflux disease)   . History of diverticulitis of colon    09/ 2015  RESOLVED   . History of kidney stones   . Hypertension   . Nephrolithiasis    BILATERAL  . OSA on CPAP    wears cpap  . Right ureteral stone   . Seasonal allergic rhinitis 01/24/2018  . Type 2 diabetes mellitus [facility])     Patient Active Problem List   Diagnosis Date Noted  . Gastroesophageal reflux disease without esophagitis 01/07/2020  . Chronic narcotic use 01/07/2020  . Obesity (BMI 30-39.9)  02/22/2019  . Refusal of statin medication by patient 01/24/2018  . Seasonal allergic rhinitis 01/24/2018  . PVC's (premature ventricular contractions) 10/08/2016  . Diabetic peripheral neuropathy ([place]) 09/28/2016  . S/P colostomy takedown 07/02/2015  . Osteopenia 05/19/2015  . Essential hypertension 01/28/2015  . Diverticulitis of large intestine 08/19/2014  . Hydradenitis 08/19/2014  . Nephrolithiasis 08/19/2014  . Obstructive sleep apnea 08/01/2014  . Controlled type 2 diabetes mellitus with diabetic polyneuropathy, without long-term current use of insulin ([place]) 06/10/2014    Past Surgical History:  Procedure Laterality Date  . BARIATRIC SURGERY    . CERVICAL CONE BIOPSY  1976  . COLON RESECTION     colostomy x 6 months  . CYSTO/  BILATERAL RETROGRADE PYELOGRAM/  PLACEMENT BILATERAL URETERAL STENTS  07-10-2011  . CYSTOSCOPY WITH RETROGRADE PYELOGRAM, URETEROSCOPY AND STENT PLACEMENT Right 10/11/2014   Procedure: CYSTOSCOPY WITH RETROGRADE PYELOGRAM, URETEROSCOPY, right ureteral biopsy, AND STENT PLACEMENT;  Surgeon: Arvil Persons, MD;  Location[facility];  Service: Urology;  Laterality: Right;  . CYSTOSCOPY/RETROGRADE/URETEROSCOPY  10/08/2011   Procedure: CYSTOSCOPY/RETROGRADE/URETEROSCOPY;  Surgeon: Hanley Ben, MD;  Location: [facility];  Service: Urology;  Laterality: N/A;  CYSTOSCOPY, RIGHT  RETROGRADE, RIGHT URETEROSCOPY HOLMIUM LASER WITH BASKET STONE EXTRACTION AND RIGHT URETERAL STENT PLACEMENT  . EXTRACORPOREAL SHOCK WAVE LITHOTRIPSY  x3  last one 01-24-2012  . TUBAL LIGATION  1987  . URETEROSCPIC STONE EXTRACTION/ STENT PLACEMENT  left 08-20-2011     OB History    Gravida  3   Para  3   Term      Preterm      AB      Living  3     SAB      IAB      Ectopic      Multiple      Live Births              Family History  Problem Relation Age of Onset  . Breast cancer Mother        37  . Renal Disease Mother    . Heart disease Mother   . Diabetes Paternal Grandmother   . Diabetes Maternal Aunt     Social History   Tobacco Use  . Smoking status: Former Smoker    Packs/day: 1.00    Years: 25.00    Pack years: 25.00    Types: Cigarettes    Quit date: 10/07/1996    Years since quitting: 24.0  . Smokeless tobacco: Never Used  Vaping Use  . Vaping Use: Never used  Substance Use Topics  . Alcohol use: Yes    Comment: rare  . Drug use: No    Home Medications Prior to Admission medications   Medication Sig Start Date End Date Taking? Authorizing Provider  Biotin 5000 MCG CAPS Take by mouth.    [provider]  Calcium Carbonate-Vitamin D (CALCIUM-VITAMIN D3 PO) Take by mouth.    [provider]  cephALEXin (KEFLEX) 500 MG capsule Take 1 capsule (500 mg total) by mouth 4 (four) times daily for 7 days. 11/03/20 11/10/20  Antonietta Breach, PA-C  Cetirizine HCl (ZYRTEC ALLERGY PO) Take by mouth. Patient not taking: Reported on 06/27/2020    [provider]  Cholecalciferol (VITAMIN D3) 1000 UNITS CAPS Take 1 capsule by mouth daily.    [provider]  Cyanocobalamin (VITAMIN B 12 PO) Take by mouth.    [provider]  diltiazem (CARDIZEM CD) 120 MG 24 hr capsule TAKE 1 CAPSULE BY MOUTH EVERY DAY 04/03/20   [name], MD  FA-B6-B12-Omega 3-Phytosterols (BP VIT 3 PO) Take by mouth.    [provider]  FIBER ADULT GUMMIES 2 g CHEW Chew by mouth.    [provider]  gabapentin (NEURONTIN) 600 MG tablet TAKE 1 TABLET BY MOUTH 4 TIMES A DAY Patient taking differently: Take 600 mg by mouth 3 (three) times daily.  03/25/20   [name], MD  hydrochlorothiazide (HYDRODIURIL) 25 MG tablet TAKE 1 TABLET BY MOUTH EVERY DAY 08/04/20   [name], MD  HYDROcodone-acetaminophen [facility]) 10-325 MG tablet Take 1 tablet by mouth in the morning, at noon, and at bedtime.  07/31/19   [provider]  losartan (COZAAR) 100 MG tablet TAKE 1  TABLET BY MOUTH EVERY DAY 08/04/20   [name], MD  metFORMIN (GLUCOPHAGE) 1000 MG tablet TAKE 1 TABLET BY MOUTH TWICE A DAY 08/04/20   [name], MD  Multiple Vitamin (MULTIVITAMIN) capsule Take by mouth.    [provider]  ondansetron (ZOFRAN ODT) 4 MG disintegrating tablet Take 1 tablet (4 mg total) by mouth every 8 (eight) hours as needed for nausea or vomiting. 11/03/20   Antonietta Breach, PA-C  pantoprazole (PROTONIX) 20 MG tablet TAKE 1 TO 2 TABLETS (20-40 MG TOTAL) BY MOUTH DAILY. 10/29/20   [name], MD    Allergies    Ace inhibitors, Codeine, and Flomax [tamsulosin hcl]  Review of Systems   Review of Systems  Gastrointestinal: Positive for abdominal pain.  Ten systems reviewed and are negative for acute change, except as noted in the HPI.    Physical Exam Updated Vital Signs BP 115/64   Pulse 65   Temp 98 F (36.7 C) (Oral)   Resp 16   Ht 5' 6.5" (1.689 m)   Wt 93 kg   SpO2 97%   BMI 32.59 kg/m   Physical Exam Vitals and nursing note reviewed.  Constitutional:      General: She is not in acute distress.    Appearance: She is well-developed and well-nourished. She is not diaphoretic.     Comments: Nontoxic appearing and in NAD  HENT:     Head: Normocephalic and atraumatic.  Eyes:     General: No scleral icterus.    Extraocular Movements: EOM normal.     Conjunctiva/sclera: Conjunctivae normal.  Pulmonary:     Effort: Pulmonary effort is normal. No respiratory distress.     Comments: Respirations even and unlabored Abdominal:       Comments: Abdomen obese, soft, nontender. No palpable masses or peritoneal signs.  Musculoskeletal:        General: Normal range of motion.     Cervical back: Normal range of motion.  Skin:    General: Skin is warm and dry.     Coloration: Skin is not pale.     Findings: No erythema or rash.  Neurological:     Mental Status: She is alert and oriented to person, place, and time.     Coordination:  Coordination normal.  Psychiatric:        Mood and Affect: Mood and affect normal.        Behavior: Behavior normal.     ED Results / Procedures / Treatments   Labs (all labs ordered are listed, but only abnormal results are displayed) Labs Reviewed  CBC WITH DIFFERENTIAL/PLATELET - Abnormal; Notable for the following components:      Result Value   Hemoglobin 11.7 (*)    All other components within normal limits  COMPREHENSIVE METABOLIC PANEL - Abnormal; Notable for the following components:   Potassium 3.3 (*)    Glucose, Bld 191 (*)    Calcium 8.5 (*)    Albumin 3.4 (*)    AST 194 (*)    ALT 59 (*)    All other components within normal limits  LIPASE, BLOOD - Abnormal; Notable for the following components:   Lipase 58 (*)    All other components within normal limits  URINALYSIS, ROUTINE W REFLEX MICROSCOPIC - Abnormal; Notable for the following components:   APPearance HAZY (*)    Specific Gravity, Urine >1.046 (*)    Leukocytes,Ua LARGE (*)    WBC, UA >50 (*)    All other components within normal limits  URINE CULTURE    EKG None  Radiology CT ABDOMEN PELVIS W CONTRAST  Result Date: 11/03/2020 CLINICAL DATA:  Right abdominal pain, nausea, vomiting, diarrhea EXAM: CT ABDOMEN AND PELVIS WITH CONTRAST TECHNIQUE: Multidetector CT imaging of the abdomen and pelvis was performed using the standard protocol following bolus administration of intravenous contrast. CONTRAST:  129mL OMNIPAQUE IOHEXOL 300 MG/ML  SOLN COMPARISON:  04/05/2016 FINDINGS: Lower chest: Visualized lung bases are clear bilaterally. The visualized heart and pericardium are unremarkable. Hepatobiliary: Cholelithiasis noted without pericholecystic inflammatory change identified. Liver unremarkable. No intra or extrahepatic biliary ductal dilation. Pancreas: Unremarkable Spleen: Unremarkable Adrenals/Urinary Tract: The adrenal glands are unremarkable. The kidneys are normal in position. Mild bilateral  renal  cortical atrophy is present, right greater than left. There is mild right hydronephrosis secondary to an obstructing 3 mm x 4 mm calculus within the mid right ureter at the level of L5-S1. Superimposed mild bilateral nonobstructing nephrolithiasis is present. No urolithiasis on the left. No hydronephrosis on the left. Multiple simple cortical cysts are seen within the kidneys bilaterally. The bladder is unremarkable. Stomach/Bowel: Large ventral hernia is present containing multiple loops of unremarkable large and small bowel as well as the distal stomach. Surgical changes of probable gastric sleeve resection, partial small bowel resection, and sigmoid colectomy are identified. The stomach, small bowel, and large bowel are otherwise unremarkable. Appendix normal. No free intraperitoneal gas or fluid. Vascular/Lymphatic: Moderate aortoiliac atherosclerotic calcification. No aortic aneurysm. No pathologic adenopathy within the abdomen and pelvis. Reproductive: 3.6 cm left ovarian simple appearing cyst is present, new since prior examination and indeterminate. The pelvic organs are otherwise unremarkable. Other: Rectum unremarkable. Musculoskeletal: No acute bone abnormality. Degenerative changes are seen within the lumbar spine and hips bilaterally. IMPRESSION: Obstructing 3 mm x 4 mm calculus within the mid right ureter resulting in mild right hydronephrosis. Superimposed bilateral nonobstructing nephrolithiasis. Bilateral renal cortical atrophy, right greater than left. Cholelithiasis. Postsurgical changes as described above. Large ventral hernia containing multiple loops of bowel as well as the distal stomach without evidence of obstruction or incarceration. 3.6 cm left ovarian simple appearing cyst, new since prior examination. Recommend follow-up US in 6-12 months. Note: This recommendation does not apply to premenarchal patients and to those with increased risk (genetic, family history, elevated tumor markers or  other high-risk factors) of ovarian cancer. Reference: JACR 2020 Feb; 17(2):248-254 Aortic Atherosclerosis (ICD10-I70.0). Electronically Signed   By: Fidela Salisbury MD   On: 11/03/2020 03:13   US Abdomen Limited  Result Date: 11/03/2020 CLINICAL DATA:  Transaminitis. Right upper quadrant pain. Gallstones. EXAM: ULTRASOUND ABDOMEN LIMITED RIGHT UPPER QUADRANT COMPARISON:  CT abdomen and pelvis 11/03/2020 FINDINGS: Gallbladder: Gallstones measuring up to 5 mm. No gallbladder wall thickening. No sonographic Murphy sign noted by sonographer. Common bile duct: Diameter: 3 mm Liver: No focal lesion identified. Within normal limits in parenchymal echogenicity. Portal vein is patent on color Doppler imaging with normal direction of blood flow towards the liver. Other: None. IMPRESSION: Cholelithiasis without evidence of cholecystitis. Electronically Signed   By: Logan Bores M.D.   On: 11/03/2020 04:06    Procedures Procedures (including critical care time)  Medications Ordered in ED Medications  sodium chloride (PF) 0.9 % injection (has no administration in time range)  cefTRIAXone (ROCEPHIN) 2 g in sodium chloride 0.9 % 100 mL IVPB (2 g Intravenous New Bag/Given 11/03/20 0552)  iohexol (OMNIPAQUE) 300 MG/ML solution 100 mL (100 mLs Intravenous Contrast Given 11/03/20 0247)  gabapentin (NEURONTIN) capsule 600 mg (600 mg Oral Given 11/03/20 0450)    ED Course  I have reviewed the triage vital signs and the nursing notes.  Pertinent labs & imaging results that were available during my care of the patient were reviewed by me and considered in my medical decision making (see chart for details).  Clinical Course as of 11/03/20 0555  Mon Nov 03, 2020  6160 Patient with gallstones noted on CT scan.  She does have a new transaminitis.  Will further investigate with right upper quadrant ultrasound.  However, CT also notable for obstructing ureterolithiasis on the right.  This may account for patient's  pain.  She is presently comfortable.  We will continue to monitor.  UA  pending [KH]  [phone] Patient complaining of some burning in her bilateral lower extremities and feet associated with her chronic neuropathy.  She has been ordered to receive her prescribed gabapentin.  Denies any recurrence of her abdominal pain.  Conveyed results of her imaging.  She verbalizes understanding.  Attempting to provide urine sample to ensure no associated UTI. [KH]    Clinical Course User Index [KH] Beverely Pace   MDM Rules/Calculators/A&P                          69 year old female presents to the emergency department for evaluation of right upper quadrant abdominal pain with nausea and vomiting.  Symptoms have been intermittent over the past 2 days, the patient has been pain free throughout the entirety of her ED visit.  Her work-up today is notable for gallstones without evidence of cholecystitis.  She has mild transaminitis.  This can be trended by her primary care doctor.  Will give referral to general surgery to discuss elective cholecystectomy.  Also, however, found to have obstructing right ureterolithiasis on CT scan.  This may also be contributing to the patient's intermittent, waxing and waning pain.  Her urinalysis is positive for pyuria without bacteriuria or nitrites.  Urine culture ordered and is pending.  As a precaution, was started on IV Rocephin in the emergency department.  Will discharge on a 1 week course of Keflex.  Given referral to urology for close outpatient follow-up and definitive management.  Patient prescribed chronic Norco which should be adequate for outpatient pain control of the aforementioned findings.  A prescription for Zofran was sent to her pharmacy for management of nausea.  Return precautions discussed and provided. Patient discharged in stable condition with no unaddressed concerns.   Final Clinical Impression(s) / ED Diagnoses Final diagnoses:  Ureterolithiasis   Pyuria  Calculus of gallbladder without cholecystitis without obstruction  Transaminitis    Rx / DC Orders ED Discharge Orders         Ordered    ondansetron (ZOFRAN ODT) 4 MG disintegrating tablet  Every 8 hours PRN        11/03/20 0552    cephALEXin (KEFLEX) 500 MG capsule  4 times daily        11/03/20 0553           Antonietta Breach, PA-C 62/26/33 [id number]    Delora Fuel, MD 62/56/38 [id number]    Delora Fuel, MD 42/87/68 [phone]

## 2020-11-04 LAB — URINE CULTURE: Culture: <10000 — AB

## 2020-12-11 ENCOUNTER — Other Ambulatory Visit: Payer: Self-pay

## 2020-12-11 ENCOUNTER — Ambulatory Visit (INDEPENDENT_AMBULATORY_CARE_PROVIDER_SITE_OTHER): Payer: Medicare Other

## 2020-12-11 DIAGNOSIS — Z0000 Encounter for general adult medical examination without abnormal findings: Secondary | ICD-10-CM

## 2020-12-11 NOTE — Progress Notes (Signed)
Virtual Visit via Telephone Note  I connected with  Marilyn Myers on 12/11/20 at  3:15 PM EST by telephone and verified that I am speaking with the correct person using two identifiers.  Medicare Annual Wellness visit completed telephonically due to Covid-19 pandemic.   Persons participating in this call: This Health Coach and this patient.   Location: Patient: Home Provider: Office   I discussed the limitations, risks, security and privacy concerns of performing an evaluation and management service by telephone and the availability of in person appointments. The patient expressed understanding and agreed to proceed.  Unable to perform video visit due to video visit attempted and failed and/or patient does not have video capability.   Some vital signs may be absent or patient reported.   Marzella Schlein, LPN    Subjective:   Marilyn Myers is a 70 y.o. female who presents for Medicare Annual (Subsequent) preventive examination.  Review of Systems     Cardiac Risk Factors include: advanced age (>44men, >93 women);diabetes mellitus;obesity (BMI >30kg/m2);hypertension     Objective:    There were no vitals filed for this visit. There is no height or weight on file to calculate BMI.  Advanced Directives 12/11/2020 12/10/2019 12/07/2018 12/01/2017 04/26/2016 07/26/2014 01/24/2012  Does Patient Have a Medical Advance Directive? Yes Yes Yes Yes Yes No Patient does not have advance directive  Type of Advance Directive Healthcare Power of [place];Living will Living will;Healthcare Power of Attorney Living will;Healthcare Power of [employer] Power of [place];Living will Healthcare Power of [place];Living will - -  Does patient want to make changes to medical advance directive? - No - Patient declined - - No - Patient declined - -  Copy of Healthcare Power of Attorney in Chart? No - copy requested No - copy requested No - copy requested No - copy requested No - copy requested - -   Would patient like information on creating a medical advance directive? - - - - - No - patient declined information -  Pre-existing out of facility DNR order (yellow form or pink MOST form) - - - - - - No    Current Medications (verified) Outpatient Encounter Medications as of 12/11/2020  Medication Sig  . Biotin 5000 MCG CAPS Take by mouth.  . Calcium Carbonate-Vitamin D (CALCIUM-VITAMIN D3 PO) Take by mouth.  . Cholecalciferol (VITAMIN D3) 1000 UNITS CAPS Take 1 capsule by mouth daily.  . Cyanocobalamin (VITAMIN B 12 PO) Take by mouth.  . diltiazem (CARDIZEM CD) 120 MG 24 hr capsule TAKE 1 CAPSULE BY MOUTH EVERY DAY  . FA-B6-B12-Omega 3-Phytosterols (BP VIT 3 PO) Take by mouth.  . FIBER ADULT GUMMIES 2 g CHEW Chew by mouth.  . gabapentin (NEURONTIN) 600 MG tablet TAKE 1 TABLET BY MOUTH 4 TIMES A DAY (Patient taking differently: Take 600 mg by mouth 3 (three) times daily.)  . hydrochlorothiazide (HYDRODIURIL) 25 MG tablet TAKE 1 TABLET BY MOUTH EVERY DAY  . HYDROcodone-acetaminophen (NORCO) 10-325 MG tablet Take 1 tablet by mouth in the morning, at noon, and at bedtime.   [name] Kitchen losartan (COZAAR) 100 MG tablet TAKE 1 TABLET BY MOUTH EVERY DAY  . metFORMIN (GLUCOPHAGE) 1000 MG tablet TAKE 1 TABLET BY MOUTH TWICE A DAY  . Multiple Vitamin (MULTIVITAMIN) capsule Take by mouth.  . pantoprazole (PROTONIX) 20 MG tablet TAKE 1 TO 2 TABLETS (20-40 MG TOTAL) BY MOUTH DAILY.  [name] Kitchen ondansetron (ZOFRAN ODT) 4 MG disintegrating tablet Take 1 tablet (4 mg total) by  mouth every 8 (eight) hours as needed for nausea or vomiting. (Patient not taking: Reported on 12/11/2020)  . [DISCONTINUED] Cetirizine HCl (ZYRTEC ALLERGY PO) Take by mouth. (Patient not taking: No sig reported)   No facility-administered encounter medications on file as of 12/11/2020.    Allergies (verified) Ace inhibitors, Codeine, and Flomax [tamsulosin hcl]   History: Past Medical History:  Diagnosis Date  . Anxiety   . Diverticulitis of  intestine without perforation or abscess   . Diverticulosis, sigmoid   . GERD (gastroesophageal reflux disease)   . History of diverticulitis of colon    09/ 2015  RESOLVED   . History of kidney stones   . Hypertension   . Nephrolithiasis    BILATERAL  . OSA on CPAP    wears cpap  . Right ureteral stone   . Seasonal allergic rhinitis 01/24/2018  . Type 2 diabetes mellitus ([place])    Past Surgical History:  Procedure Laterality Date  . BARIATRIC SURGERY    . CERVICAL CONE BIOPSY  1976  . COLON RESECTION     colostomy x 6 months  . CYSTO/  BILATERAL RETROGRADE PYELOGRAM/  PLACEMENT BILATERAL URETERAL STENTS  07-10-2011  . CYSTOSCOPY WITH RETROGRADE PYELOGRAM, URETEROSCOPY AND STENT PLACEMENT Right 10/11/2014   Procedure: CYSTOSCOPY WITH RETROGRADE PYELOGRAM, URETEROSCOPY, right ureteral biopsy, AND STENT PLACEMENT;  Surgeon: Arvil Persons, MD;  [facility];  Service: Urology;  Laterality: Right;  . CYSTOSCOPY/RETROGRADE/URETEROSCOPY  10/08/2011   Procedure: CYSTOSCOPY/RETROGRADE/URETEROSCOPY;  Surgeon: Hanley Ben, MD;  [facility];  Service: Urology;  Laterality: N/A;  CYSTOSCOPY, RIGHT  RETROGRADE, RIGHT URETEROSCOPY HOLMIUM LASER WITH BASKET STONE EXTRACTION AND RIGHT URETERAL STENT PLACEMENT  . EXTRACORPOREAL SHOCK WAVE LITHOTRIPSY  x3  last one 01-24-2012  . TUBAL LIGATION  1987  . URETEROSCPIC STONE EXTRACTION/ STENT PLACEMENT  left 08-20-2011   Family History  Problem Relation Age of Onset  . Breast cancer Mother        68  . Renal Disease Mother   . Heart disease Mother   . Diabetes Paternal Grandmother   . Diabetes Maternal Aunt    Social History   Socioeconomic History  . Marital status: Widowed    Spouse name: Not on file  . Number of children: 3  . Years of education: Not on file  . Highest education level: Not on file  Occupational History  . Occupation: Retired   Tobacco Use  . Smoking status: Former Smoker     Packs/day: 1.00    Years: 25.00    Pack years: 25.00    Types: Cigarettes    Quit date: 10/07/1996    Years since quitting: 24.1  . Smokeless tobacco: Never Used  Vaping Use  . Vaping Use: Never used  Substance and Sexual Activity  . Alcohol use: Not Currently  . Drug use: No  . Sexual activity: Never  Other Topics Concern  . Not on file  Social History Narrative   In process of selling home and moving to [place] with daughter   Social Determinants of Health   Financial Resource Strain: Low Risk   . Difficulty of Paying Living Expenses: Not hard at all  Food Insecurity: No Food Insecurity  . Worried About Charity fundraiser in the Last Year: Never true  . Ran Out of Food in the Last Year: Never true  Transportation Needs: No Transportation Needs  . Lack of Transportation (Medical): No  . Lack of Transportation (Non-Medical):  No  Physical Activity: Inactive  . Days of Exercise per Week: 0 days  . Minutes of Exercise per Session: 0 min  Stress: No Stress Concern Present  . Feeling of Stress : Not at all  Social Connections: Moderately Isolated  . Frequency of Communication with Friends and Family: More than three times a week  . Frequency of Social Gatherings with Friends and Family: Once a week  . Attends Religious Services: More than 4 times per year  . Active Member of Clubs or Organizations: No  . Attends Banker Meetings: Never  . Marital Status: Widowed    Tobacco Counseling Counseling given: Not Answered   Clinical Intake:  Pre-visit preparation completed: Yes  Pain : No/denies pain     BMI - recorded: 32.6 Nutritional Status: BMI > 30  Obese Nutritional Risks: None Diabetes: Yes CBG done?: No Did pt. bring in CBG monitor from home?: No  How often do you need to have someone help you when you read instructions, pamphlets, or other written materials from your doctor or pharmacy?: 1 - Never  Diabetic?Nutrition Risk Assessment:  Has  the patient had any N/V/D within the last 2 months?  No  Does the patient have any non-healing wounds?  No  Has the patient had any unintentional weight loss or weight gain?  No   Diabetes:  Is the patient diabetic?  Yes  If diabetic, was a CBG obtained today?  No  Did the patient bring in their glucometer from home?  No  How often do you monitor your CBG's? N/A.   Financial Strains and Diabetes Management:  Are you having any financial strains with the device, your supplies or your medication? No .  Does the patient want to be seen by Chronic Care Management for management of their diabetes?  No  Would the patient like to be referred to a Nutritionist or for Diabetic Management?  No   Diabetic Exams:  Diabetic Eye Exam: Completed per pt in 08/2020 Diabetic Foot Exam: Completed 01/07/20   Interpreter Needed?: No  Information entered by :: Lanier Ensign , LPN   Activities of Daily Living In your present state of health, do you have any difficulty performing the following activities: 12/11/2020  Hearing? N  Vision? N  Difficulty concentrating or making decisions? Y  Comment recall at times  Walking or climbing stairs? N  Dressing or bathing? N  Doing errands, shopping? N  Preparing Food and eating ? N  Using the Toilet? N  In the past six months, have you accidently leaked urine? N  Do you have problems with loss of bowel control? N  Managing your Medications? N  Managing your Finances? N  Housekeeping or managing your Housekeeping? N  Some recent data might be hidden    Patient Care Team: Willow Ora, MD as PCP - General (Family Medicine) Dasher, Fayrene Fearing, MD (Surgery) Genia Del, MD as Consulting Physician (Obstetrics and Gynecology) Shea Evans [facility]) Aris Lot, MD as Consulting Physician (Dermatology) Roche, Nolon Bussing, PA-C as Physician Assistant (Pain Medicine) Dahlia Byes, [facility] as Pharmacist (Pharmacist)  Indicate any recent Medical  Services you may have received from other than [facility] providers in the past year (date may be approximate).     Assessment:   This is a routine wellness examination for [place].  Hearing/Vision screen  Hearing Screening   125Hz  250Hz  500Hz  1000Hz  2000Hz  3000Hz  4000Hz  6000Hz  8000Hz   Right ear:  Left ear:           Comments: No hearing issues   Vision Screening Comments: Pt follows up with Dr Alois Cliche  Dietary issues and exercise activities discussed: Current Exercise Habits: The patient does not participate in regular exercise at present  Goals    . Patient Stated     Follow diet and organize/clean house    . Patient Stated     Not at this time    . PharmD Care Plan     CARE PLAN ENTRY (see longitudinal plan of care for additional care plan information)  Current Barriers:  . Chronic Disease Management support, education, and care coordination needs related to GERD, Hypertension, Hyperlipidemia, and Diabetes   Hypertension BP Readings from Last 3 Encounters:  06/27/20 130/68  06/11/20 126/70  04/30/20 112/70   . Pharmacist Clinical Goal(s): o Over the next 180 days, patient will work with PharmD and providers to maintain BP goal <130/80 . Current regimen:  . Losartan 100 mg once daily . Diltiazem 120 mg 120 mg once daily . Hydrochlorothiazide 25 mg once daily . Interventions: o Continue current management . Patient self care activities - Over the next 180 days, patient will: o Check BP at least once every 1-2 weeks, document, and provide at future appointments o Ensure daily salt intake < 2300 mg/day  Hyperlipidemia Lab Results  Component Value Date/Time   LDLCALC 87 11/06/2018 12:00 AM   LDLDIRECT 115.0 08/27/2019 09:27 AM   . Pharmacist Clinical Goal(s): o Over the next 180 days, patient will work with PharmD and providers to achieve LDL goal < 100 . Current regimen:  o Continue current management . Interventions: o Diet counseling . Patient self  care activities - Over the next 180 days, patient will: o Continue current management  Diabetes  Lab Results  Component Value Date/Time   HGBA1C 6.7 (A) 04/30/2020 04:44 PM   HGBA1C 6.7 (A) 01/07/2020 09:48 AM   HGBA1C 6.4 11/06/2018 12:00 AM   . Pharmacist Clinical Goal(s): o Over the next 180 days, patient will work with PharmD and providers to maintain A1c goal <7% . Current regimen:  o Metformin 1000 mg twice daily  . Interventions: o Continue current management . Patient self care activities - Over the next 180 days, patient will: o Check blood sugar as directed, document, and provide at future appointments o Contact provider with any episodes of hypoglycemia  GERD . Pharmacist Clinical Goal(s) o Over the next 180 days, patient will work with PharmD and providers to minimize acid reflux symptoms . Current regimen:  o Pantoprazole 20-40 mg daily . Interventions: o Discussed acid reflux food triggers . Patient self care activities - Over the next 180 days, patient will: o Continue current management with emphasis on food trigger avoidance  Medication management . Pharmacist Clinical Goal(s): o Over the next 180 days, patient will work with PharmD and providers to maintain optimal medication adherence . Current pharmacy: [facility] . Interventions o Comprehensive medication review performed. o Continue current medication management strategy . Patient self care activities - Over the next 180 days, patient will: o Take medications as prescribed o Report any questions or concerns to PharmD and/or provider(s)  Initial goal documentation.    . Weight (lb) < 200 lb (90.7 kg)     Begin "bariatric diet" again.       Depression Screen PHQ 2/9 Scores 12/11/2020 06/27/2020 02/22/2019 12/07/2018 01/24/2018 12/01/2017  PHQ - 2 Score 0 0 0 0 0  0    Fall Risk Fall Risk  12/11/2020 12/10/2019 02/22/2019 12/07/2018 01/24/2018  Falls in the past year? 0 0 0 0 No  Number falls in past yr: 0 0 0 - -   Injury with Fall? 0 0 0 - -  Risk for fall due to : Impaired vision - - - -  Follow up Falls prevention discussed Falls evaluation completed;Education provided;Falls prevention discussed Falls evaluation completed - -    FALL RISK PREVENTION PERTAINING TO THE HOME:  Any stairs in or around the home? Yes  If so, are there any without handrails? No  Home free of loose throw rugs in walkways, pet beds, electrical cords, etc? Yes  Adequate lighting in your home to reduce risk of falls? Yes   ASSISTIVE DEVICES UTILIZED TO PREVENT FALLS:  Life alert? No  Use of a cane, walker or w/c? No  Grab bars in the bathroom? Yes  Shower chair or bench in shower? No  Elevated toilet seat or a handicapped toilet? No   TIMED UP AND GO:  Was the test performed? No .     Cognitive Function: MMSE - Mini Mental State Exam 12/07/2018 12/01/2017  Orientation to time 5 5  Orientation to Place 5 5  Registration 3 3  Attention/ Calculation 5 5  Recall 3 3  Language- name 2 objects 2 2  Language- repeat 1 1  Language- follow 3 step command 3 3  Language- read & follow direction 1 1  Write a sentence 1 1  Copy design 1 1  Total score 30 30     6CIT Screen 12/11/2020 12/10/2019  What Year? 0 points 0 points  What month? 0 points 0 points  What time? - 0 points  Count back from 20 0 points 0 points  Months in reverse 0 points 0 points  Repeat phrase 0 points 0 points  Total Score - 0    Immunizations Immunization History  Administered Date(s) Administered  . Fluad Quad(high Dose 65+) 08/27/2019  . Influenza, High Dose Seasonal PF 08/19/2014, 09/02/2015, 10/08/2016, 09/22/2017, 08/29/2018  . Influenza-Unspecified 10/13/2020  . PFIZER(Purple Top)SARS-COV-2 Vaccination 12/29/2019, 01/18/2020, 10/13/2020  . Pneumococcal Conjugate-13 10/18/2016  . Pneumococcal Polysaccharide-23 08/19/2014    TDAP status: Due, Education has been provided regarding the importance of this vaccine. Advised may  receive this vaccine at local pharmacy or Health Dept. Aware to provide a copy of the vaccination record if obtained from local pharmacy or Health Dept. Verbalized acceptance and understanding.  Flu Vaccine status: Up to date done 10/13/20  Pneumococcal vaccine status: Up to date  Covid-19 vaccine status: Completed vaccines  Qualifies for Shingles Vaccine? Yes   Zostavax completed No   Shingrix Completed?: No.    Education has been provided regarding the importance of this vaccine. Patient has been advised to call insurance company to determine out of pocket expense if they have not yet received this vaccine. Advised may also receive vaccine at local pharmacy or Health Dept. Verbalized acceptance and understanding.  Screening Tests Health Maintenance  Topic Date Due  . Hepatitis C Screening  Never done  . HEMOGLOBIN A1C  10/30/2020  . MAMMOGRAM  12/11/2021 (Originally 05/31/2019)  . FOOT EXAM  01/06/2021  . DEXA SCAN  07/04/2021  . OPHTHALMOLOGY EXAM  08/25/2021  . COLONOSCOPY (Pts 45-61yrs Insurance coverage will need to be confirmed)  06/28/2028  . INFLUENZA VACCINE  Completed  . COVID-19 Vaccine  Completed  . PNA vac Low Risk Adult  Completed  Health Maintenance  Health Maintenance Due  Topic Date Due  . Hepatitis C Screening  Never done  . HEMOGLOBIN A1C  10/30/2020    Colorectal cancer screening: Type of screening: Colonoscopy. Completed 06/28/18. Repeat every 10 years  Mammogram status: Completed 05/30/18. Repeat every year  Bone Density status: Completed 07/04/18. Results reflect: Bone density results: OSTEOPENIA. Repeat every 2 years.   Additional Screening:  Hepatitis C Screening: does qualify;   Vision Screening: Recommended annual ophthalmology exams for early detection of glaucoma and other disorders of the eye. Is the patient up to date with their annual eye exam?  Yes  Who is the provider or what is the name of the office in which the patient attends annual  eye exams? Dr Alois Cliche    Dental Screening: Recommended annual dental exams for proper oral hygiene  Community Resource Referral / Chronic Care Management: CRR required this visit?  No   CCM required this visit?  No      Plan:     I have personally reviewed and noted the following in the patient's chart:   . Medical and social history . Use of alcohol, tobacco or illicit drugs  . Current medications and supplements . Functional ability and status . Nutritional status . Physical activity . Advanced directives . List of other physicians . Hospitalizations, surgeries, and ER visits in previous 12 months . Vitals . Screenings to include cognitive, depression, and falls . Referrals and appointments  In addition, I have reviewed and discussed with patient certain preventive protocols, quality metrics, and best practice recommendations. A written personalized care plan for preventive services as well as general preventive health recommendations were provided to patient.     Willette Brace, LPN   [id number]/89/[id number]   Nurse Notes: None

## 2020-12-11 NOTE — Patient Instructions (Addendum)
[name] , Thank you for taking time to come for your Medicare Wellness Visit. I appreciate your ongoing commitment to your health goals. Please review the following plan we discussed and let me know if I can assist you in the future.   Screening recommendations/referrals: Colonoscopy: 06/28/18 Mammogram: Done 05/30/18 Bone Density: Done 07/04/18 Recommended yearly ophthalmology/optometry visit for glaucoma screening and checkup Recommended yearly dental visit for hygiene and checkup  Vaccinations: Influenza vaccine: Done 10/13/20 Up to date Pneumococcal vaccine: Up to date Tdap vaccine: Due and discussed Shingles vaccine: Shingrix discussed. Please contact your pharmacy for coverage information.    Covid-19:Completed 2/6, 2/26, & 10/13/20  Advanced directives: Please bring a copy of your health care power of attorney and living will to the office at your convenience.  Conditions/risks identified: None at this time  Next appointment: Follow up in one year for your annual wellness visit    Preventive Care 65 Years and Older, Female Preventive care refers to lifestyle choices and visits with your health care provider that can promote health and wellness. What does preventive care include?  A yearly physical exam. This is also called an annual well check.  Dental exams once or twice a year.  Routine eye exams. Ask your health care provider how often you should have your eyes checked.  Personal lifestyle choices, including:  Daily care of your teeth and gums.  Regular physical activity.  Eating a healthy diet.  Avoiding tobacco and drug use.  Limiting alcohol use.  Practicing safe sex.  Taking low-dose aspirin every day.  Taking vitamin and mineral supplements as recommended by your health care provider. What happens during an annual well check? The services and screenings done by your health care provider during your annual well check will depend on your age, overall  health, lifestyle risk factors, and family history of disease. Counseling  Your health care provider may ask you questions about your:  Alcohol use.  Tobacco use.  Drug use.  Emotional well-being.  Home and relationship well-being.  Sexual activity.  Eating habits.  History of falls.  Memory and ability to understand (cognition).  Work and work Statistician.  Reproductive health. Screening  You may have the following tests or measurements:  Height, weight, and BMI.  Blood pressure.  Lipid and cholesterol levels. These may be checked every 5 years, or more frequently if you are over 78 years old.  Skin check.  Lung cancer screening. You may have this screening every year starting at age 83 if you have a 30-pack-year history of smoking and currently smoke or have quit within the past 15 years.  Fecal occult blood test (FOBT) of the stool. You may have this test every year starting at age 63.  Flexible sigmoidoscopy or colonoscopy. You may have a sigmoidoscopy every 5 years or a colonoscopy every 10 years starting at age 72.  Hepatitis C blood test.  Hepatitis B blood test.  Sexually transmitted disease (STD) testing.  Diabetes screening. This is done by checking your blood sugar (glucose) after you have not eaten for a while (fasting). You may have this done every 1-3 years.  Bone density scan. This is done to screen for osteoporosis. You may have this done starting at age 73.  Mammogram. This may be done every 1-2 years. Talk to your health care provider about how often you should have regular mammograms. Talk with your health care provider about your test results, treatment options, and if necessary, the need for more  tests. Vaccines  Your health care provider may recommend certain vaccines, such as:  Influenza vaccine. This is recommended every year.  Tetanus, diphtheria, and acellular pertussis (Tdap, Td) vaccine. You may need a Td booster every 10  years.  Zoster vaccine. You may need this after age 66.  Pneumococcal 13-valent conjugate (PCV13) vaccine. One dose is recommended after age 62.  Pneumococcal polysaccharide (PPSV23) vaccine. One dose is recommended after age 22. Talk to your health care provider about which screenings and vaccines you need and how often you need them. This information is not intended to replace advice given to you by your health care provider. Make sure you discuss any questions you have with your health care provider. Document Released: 12/05/2015 Document Revised: 07/28/2016 Document Reviewed: 09/09/2015 Elsevier Interactive Patient Education  2017 [place] Prevention in the Home Falls can cause injuries. They can happen to people of all ages. There are many things you can do to make your home safe and to help prevent falls. What can I do on the outside of my home?  Regularly fix the edges of walkways and driveways and fix any cracks.  Remove anything that might make you trip as you walk through a door, such as a raised step or threshold.  Trim any bushes or trees on the path to your home.  Use bright outdoor lighting.  Clear any walking paths of anything that might make someone trip, such as rocks or tools.  Regularly check to see if handrails are loose or broken. Make sure that both sides of any steps have handrails.  Any raised decks and porches should have guardrails on the edges.  Have any leaves, snow, or ice cleared regularly.  Use sand or salt on walking paths during winter.  Clean up any spills in your garage right away. This includes oil or grease spills. What can I do in the bathroom?  Use night lights.  Install grab bars by the toilet and in the tub and shower. Do not use towel bars as grab bars.  Use non-skid mats or decals in the tub or shower.  If you need to sit down in the shower, use a plastic, non-slip stool.  Keep the floor dry. Clean up any water that  spills on the floor as soon as it happens.  Remove soap buildup in the tub or shower regularly.  Attach bath mats securely with double-sided non-slip rug tape.  Do not have throw rugs and other things on the floor that can make you trip. What can I do in the bedroom?  Use night lights.  Make sure that you have a light by your bed that is easy to reach.  Do not use any sheets or blankets that are too big for your bed. They should not hang down onto the floor.  Have a firm chair that has side arms. You can use this for support while you get dressed.  Do not have throw rugs and other things on the floor that can make you trip. What can I do in the kitchen?  Clean up any spills right away.  Avoid walking on wet floors.  Keep items that you use a lot in easy-to-reach places.  If you need to reach something above you, use a strong step stool that has a grab bar.  Keep electrical cords out of the way.  Do not use floor polish or wax that makes floors slippery. If you must use wax, use non-skid floor  wax.  Do not have throw rugs and other things on the floor that can make you trip. What can I do with my stairs?  Do not leave any items on the stairs.  Make sure that there are handrails on both sides of the stairs and use them. Fix handrails that are broken or loose. Make sure that handrails are as long as the stairways.  Check any carpeting to make sure that it is firmly attached to the stairs. Fix any carpet that is loose or worn.  Avoid having throw rugs at the top or bottom of the stairs. If you do have throw rugs, attach them to the floor with carpet tape.  Make sure that you have a light switch at the top of the stairs and the bottom of the stairs. If you do not have them, ask someone to add them for you. What else can I do to help prevent falls?  Wear shoes that:  Do not have high heels.  Have rubber bottoms.  Are comfortable and fit you well.  Are closed at the  toe. Do not wear sandals.  If you use a stepladder:  Make sure that it is fully opened. Do not climb a closed stepladder.  Make sure that both sides of the stepladder are locked into place.  Ask someone to hold it for you, if possible.  Clearly mark and make sure that you can see:  Any grab bars or handrails.  First and last steps.  Where the edge of each step is.  Use tools that help you move around (mobility aids) if they are needed. These include:  Canes.  Walkers.  Scooters.  Crutches.  Turn on the lights when you go into a dark area. Replace any light bulbs as soon as they burn out.  Set up your furniture so you have a clear path. Avoid moving your furniture around.  If any of your floors are uneven, fix them.  If there are any pets around you, be aware of where they are.  Review your medicines with your doctor. Some medicines can make you feel dizzy. This can increase your chance of falling. Ask your doctor what other things that you can do to help prevent falls. This information is not intended to replace advice given to you by your health care provider. Make sure you discuss any questions you have with your health care provider. Document Released: 09/04/2009 Document Revised: 04/15/2016 Document Reviewed: 12/13/2014 Elsevier Interactive Patient Education  2017 [employer].

## 2021-01-01 DIAGNOSIS — Z79899 Other long term (current) drug therapy: Secondary | ICD-10-CM | POA: Diagnosis not present

## 2021-01-01 DIAGNOSIS — R200 Anesthesia of skin: Secondary | ICD-10-CM | POA: Diagnosis not present

## 2021-01-01 DIAGNOSIS — M79671 Pain in right foot: Secondary | ICD-10-CM | POA: Diagnosis not present

## 2021-01-01 DIAGNOSIS — G629 Polyneuropathy, unspecified: Secondary | ICD-10-CM | POA: Diagnosis not present

## 2021-01-01 DIAGNOSIS — Z79891 Long term (current) use of opiate analgesic: Secondary | ICD-10-CM | POA: Diagnosis not present

## 2021-01-01 DIAGNOSIS — G894 Chronic pain syndrome: Secondary | ICD-10-CM | POA: Diagnosis not present

## 2021-01-06 NOTE — Progress Notes (Unsigned)
Chronic Care Management Pharmacy Note  01/06/2021 Name:  Marilyn Myers MRN:  [id number] DOB:  [birth year]  Subjective: Marilyn Myers is an 70 y.o. year old female who is a primary patient of [name], MD.  The CCM team was consulted for assistance with disease management and care coordination needs.   Engaged with patient by telephone for follow up visit in response to provider referral for pharmacy case management and/or care coordination services.   Consent to Services:  The patient was given information about Chronic Care Management services, agreed to services, and gave verbal consent prior to initiation of services.  Please see initial visit note for detailed documentation.  Patient Care Team: [name], MD as PCP - General (Family Medicine) Dasher, Jeneen Rinks, MD (Surgery) Princess Bruins, MD as Consulting Physician (Obstetrics and Gynecology) Idolina Primer [facility]) Harriett Sine, MD as Consulting Physician (Dermatology) Roche, Christian Mate, PA-C as Physician Assistant (Pain Medicine) Madelin Rear, [facility] as Pharmacist (Pharmacist)  Recent consult visits: 11/03/2020 (ED): ureterolithiasis. Schedule an appointment with Alyson Ingles Candee Furbish, MD (Urology); Follow up with urology for further evaluation of your kidney stone. 1 Follow up with [name], MD (Family Medicine); Have your liver tests rechecked by your primary care doctor within the week 2 Follow up with Surgery, [place] (General Surgery); Follow up with general surgery to discuss elective gallbladder removal (cholecystectomy) 3 Follow up with [place] DEPT (Emergency Medicine); As needed, if symptoms worsen  Objective: Lab Results  Component Value Date   CREATININE 0.96 11/03/2020   BUN 20 11/03/2020   GFR 51.20 (L) 08/27/2019   GFRNONAA >60 11/03/2020   GFRAA 77 (L) 07/26/2014   NA 137 11/03/2020   K 3.3 (L) 11/03/2020   CALCIUM 8.5 (L) 11/03/2020   CO2  24 11/03/2020   Lab Results  Component Value Date/Time   HGBA1C 6.7 (A) 04/30/2020 04:44 PM   HGBA1C 6.7 (A) 01/07/2020 09:48 AM   HGBA1C 6.4 11/06/2018 12:00 AM   GFR 51.20 (L) 08/27/2019 09:27 AM    Last diabetic Eye exam: No results found for: HMDIABEYEEXA  Last diabetic Foot exam: No results found for: HMDIABFOOTEX  Lab Results  Component Value Date   CHOL 200 08/27/2019   HDL 41.30 08/27/2019   LDLCALC 87 11/06/2018   LDLDIRECT 115.0 08/27/2019   TRIG 392.0 (H) 08/27/2019   CHOLHDL 5 08/27/2019   Hepatic Function Latest Ref Rng & Units 11/03/2020 08/27/2019 11/06/2018  Total Protein 6.5 - 8.1 g/dL 6.9 6.8 -  Albumin 3.5 - 5.0 g/dL 3.4(L) 3.9 -  AST 15 - 41 U/L 194(H) 12 18  ALT 0 - 44 U/L 59(H) 13 15  Alk Phosphatase 38 - 126 U/L 78 74 -  Total Bilirubin 0.3 - 1.2 mg/dL 0.9 0.8 -   Lab Results  Component Value Date/Time   TSH 1.91 08/27/2019 09:27 AM   CBC Latest Ref Rng & Units 11/03/2020 08/27/2019 11/06/2018  WBC 4.0 - 10.5 K/uL 9.0 9.4 9.1  Hemoglobin 12.0 - 15.0 g/dL 11.7(L) 12.3 12.2  Hematocrit 36.0 - 46.0 % 36.7 37.5 37  Platelets 150 - 400 K/uL 254 266.0 -   Lab Results  Component Value Date/Time   VD25OH 43.9 11/06/2018 12:00 AM   Clinical ASCVD: No  The 10-year ASCVD risk score Mikey Bussing DC Jr., et al., 2013) is: 21.1%   Values used to calculate the score:     Age: 66 years     Sex: Female  Is Non-Hispanic African American: No     Diabetic: Yes     Tobacco smoker: No     Systolic Blood Pressure: 315 mmHg     Is BP treated: Yes     HDL Cholesterol: 41.3 mg/dL     Total Cholesterol: 200 mg/dL    Depression screen [facility] 2/9 12/11/2020 06/27/2020 02/22/2019  Decreased Interest 0 0 0  Down, Depressed, Hopeless 0 0 0  PHQ - 2 Score 0 0 0    Social History   Tobacco Use  Smoking Status Former Smoker  . Packs/day: 1.00  . Years: 25.00  . Pack years: 25.00  . Types: Cigarettes  . Quit date: 10/07/1996  . Years since quitting: 24.2  Smokeless Tobacco  Never Used   BP Readings from Last 3 Encounters:  11/03/20 113/69  06/27/20 130/68  06/11/20 126/70   Pulse Readings from Last 3 Encounters:  11/03/20 63  06/27/20 81  06/11/20 87   Wt Readings from Last 3 Encounters:  11/03/20 205 lb (93 kg)  06/27/20 206 lb 9.6 oz (93.7 kg)  06/11/20 208 lb (94.3 kg)   Assessment/Interventions: Review of patient past medical history, allergies, medications, health status, including review of consultants reports, laboratory and other test data, was performed as part of comprehensive evaluation and provision of chronic care management services.   SDOH:  (Social Determinants of Health) assessments and interventions performed: Yes  CCM Care Plan Allergies  Allergen Reactions  . Ace Inhibitors Cough  . Codeine Nausea And Vomiting  . Flomax [Tamsulosin Hcl] Itching   Medications Reviewed Today    Reviewed by Madelin Rear, [facility] (Pharmacist) on 07/08/20 at 1125  Med List Status: <None>  Medication Order Taking? Sig Documenting Provider Last Dose Status Informant  Biotin 5000 MCG CAPS [id number] Yes Take by mouth. [provider] Taking Active   Calcium Carbonate-Vitamin D (CALCIUM-VITAMIN D3 PO) [id number] Yes Take by mouth. [provider] Taking Active   Cetirizine HCl (ZYRTEC ALLERGY PO) [id number]  Take by mouth.  Patient not taking: Reported on 06/27/2020   [provider]  Consider Medication Status and Discontinue (Patient Preference)   Cholecalciferol (VITAMIN D3) 1000 UNITS CAPS [id number] Yes Take 1 capsule by mouth daily. [provider] Taking Active   Cyanocobalamin (VITAMIN B 12 PO) [id number] Yes Take by mouth. [provider] Taking Active   diltiazem (CARDIZEM CD) 120 MG 24 hr capsule [id number] Yes TAKE 1 CAPSULE BY MOUTH EVERY DAY [name], MD Taking Active   FA-B6-B12-Omega 3-Phytosterols (BP VIT 3 PO) [id number] Yes Take by mouth. [provider] Taking Active   FIBER ADULT  GUMMIES 2 g CHEW [id number] Yes Chew by mouth. [provider] Taking Active   gabapentin (NEURONTIN) 600 MG tablet [id number] Yes TAKE 1 TABLET BY MOUTH 4 TIMES A DAY  Patient taking differently: Take 600 mg by mouth 3 (three) times daily.    [name], MD Taking Active   hydrochlorothiazide (HYDRODIURIL) 25 MG tablet [id number] Yes Take 1 tablet (25 mg total) by mouth daily. [name], MD Taking Active   HYDROcodone-acetaminophen [facility]) 10-325 MG tablet [id number] Yes Take 1 tablet by mouth in the morning, at noon, and at bedtime.  [provider] Taking Active   losartan (COZAAR) 100 MG tablet [id number] Yes Take 1 tablet (100 mg total) by mouth daily. [name], MD Taking Active   metFORMIN (GLUCOPHAGE) 1000 MG tablet [id number] Yes Take 1 tablet (1,000 mg total) by mouth 2 (  two) times daily. [name], MD Taking Active   Multiple Vitamin (MULTIVITAMIN) capsule [id number] Yes Take by mouth. [provider] Taking Active   pantoprazole (PROTONIX) 20 MG tablet [id number] Yes Take 1-2 tablets (20-40 mg total) by mouth daily. [name], MD Taking Active          Patient Active Problem List   Diagnosis Date Noted  . Gastroesophageal reflux disease without esophagitis 01/07/2020  . Chronic narcotic use 01/07/2020  . Obesity (BMI 30-39.9) 02/22/2019  . Refusal of statin medication by patient 01/24/2018  . Seasonal allergic rhinitis 01/24/2018  . PVC's (premature ventricular contractions) 10/08/2016  . Diabetic peripheral neuropathy ([place]) 09/28/2016  . S/P colostomy takedown 07/02/2015  . Osteopenia 05/19/2015  . Essential hypertension 01/28/2015  . Diverticulitis of large intestine 08/19/2014  . Hydradenitis 08/19/2014  . Nephrolithiasis 08/19/2014  . Obstructive sleep apnea 08/01/2014  . Controlled type 2 diabetes mellitus with diabetic polyneuropathy, without long-term current use of insulin ([place]) 06/10/2014   Immunization History   Administered Date(s) Administered  . Fluad Quad(high Dose 65+) 08/27/2019  . Influenza, High Dose Seasonal PF 08/19/2014, 09/02/2015, 10/08/2016, 09/22/2017, 08/29/2018  . Influenza-Unspecified 10/13/2020  . PFIZER(Purple Top)SARS-COV-2 Vaccination 12/29/2019, 01/18/2020, 10/13/2020  . Pneumococcal Conjugate-13 10/18/2016  . Pneumococcal Polysaccharide-23 08/19/2014   Conditions to be addressed/monitored:  Hypertension, Diabetes and GERD Care Plan : [place]  Updates made by Madelin Rear, [facility] since 01/07/2021 12:00 AM    Problem: CHL AMB "PATIENT-SPECIFIC PROBLEM"   Note:   HTN DM Osteoporosis   Long-Range Goal: Patient-Specific Goal   Start Date: 01/07/2021  Expected End Date: 01/07/2022  This Visit's Progress: Not on track  Priority: High  Note:     Medication Assistance: None required.  Patient affirms current coverage meets needs. Patient's preferred pharmacy is:  [id number] [place] - [address]Phone: [phone]: [address] [place]Phone: [phone]: [phone] [id number] [id number]: [phone]: [address]one: [phone]: [phone] pill box? Yes. Pt endorses 100% compliance. We discussed: Current pharmacy is preferred with insurance plan and patient is satisfied with pharmacy services. Patient decided to: Continue current medication management strategy Patient agrees to Care Plan and Follow-up.  Current Barriers:  . n/a  Pharmacist Clinical Goal(s):  . [name] Kitchenver the next 365 days,  patient will achieve adherence to monitoring guidelines and medication adherence to achieve therapeutic efficacy . contact provider office for questions/concerns as evidenced notation of same in electronic health record through collaboration with PharmD and provider.   Interventions: . 1:1 collaboration with AnLeamon ArntMD regarding development and update of comprehensive plan of care as evidenced by provider attestation and co-signature . Inter-disciplinary care team collaboration (see longitudinal plan of care) . Comprehensive medication review performed; medication list updated in electronic medical record  Hypertension (BP goal <130/80) -controlled -Current treatment: . Losartan 100 mg once daily . Diltiazem 120 mg 120 mg once daily . Hydrochlorothiazide 25 mg once daily -Current home readings: n/a from home, reports 119/70s from clinic.  -Current dietary habits: see DM -Current exercise  habits: see DM -Denies hypotensive/hypertensive symptoms -Educated on BP goals and benefits of medications for prevention of heart attack, stroke and kidney damage; Daily salt intake goal < 2300 mg; Exercise goal of 150 minutes per week; -Counseled to monitor BP at home, document, and provide log at future appointments -Counseled on diet and exercise extensively Recommended to continue current medication  Osteopenia -controlled -last dexa 2019 with rec 3-5 yr f/u. -Current treatment: Calcium-vitamin D3 545m-25mcg twice daily -Counseled on diet and exercise extensively Recommended to continue current medication  GERD -controlled -Current treatment: . Pantoprazole 20-40 mg once daily -Medications previously tried: rosuvastatin 10 mg once daily (intolerant) -Current dietary patterns: identifies lemon juice, chocolate and late night eating as potential triggers -Current exercise habits: see DM -Educated on acid reflux triggers -Counseled on diet and exercise extensively Recommended to  continue current medication  Diabetes (A1c goal <7%) -controlled -Current medications: .[name] KitchenMetformin 1000 mg twice daily  -Medications previously tried: n/a  -Current home glucose readings . fasting glucose: n/a . post prandial glucose: n/a -Denies hypoglycemic/hyperglycemic symptoms -Current meal patterns: grazing more than   . drinks: crystal light, minimal coffee -Current exercise: limited due to pain in feet, walk out at stores.  -Educated onA1c and blood sugar goals; -Counseled to check feet daily and get yearly eye exams -Counseled on diet and exercise extensively Recommended to continue current medication  Patient Goals/Self-Care Activities . Over the next 365 days, patient will:  - take medications as prescribed target a minimum of 150 minutes of moderate intensity exercise weekly engage in dietary modifications by avoidance of reflux triggers as discussed    Future Appointments  Date Time Provider [place] 01/07/2021  1:00 PM [facility] CCM PHARMACIST [facility]  01/16/2021 10:30 AM ALeamon Arnt MD [facility]  12/17/2021  3:15 PM [facility] HEALTH COACH [facility]   Follow-up plan with Care Management Team: . CPA: 1 month gen/adh, ask about reflux-discussed potential avoidance of triggers including chocolate, eating late at night, lemon juice. . RPH: 3 month telephone visit  JMadelin Rear Pharm.D., BCGP Clinical Pharmacist [place]([phone]

## 2021-01-07 ENCOUNTER — Ambulatory Visit: Payer: Medicare Other

## 2021-01-07 DIAGNOSIS — E1142 Type 2 diabetes mellitus with diabetic polyneuropathy: Secondary | ICD-10-CM

## 2021-01-07 DIAGNOSIS — K219 Gastro-esophageal reflux disease without esophagitis: Secondary | ICD-10-CM

## 2021-01-07 DIAGNOSIS — I10 Essential (primary) hypertension: Secondary | ICD-10-CM

## 2021-01-07 NOTE — Patient Instructions (Signed)
Marilyn Myers,  Thank you for taking the time to review your medications with me today.  I have included our care plan/goals in the following pages. Please review and call me at [phone] with any questions!  Thanks! Ellin Mayhew, Pharm.D., BCGP Clinical Pharmacist [facility] [place] [phone] Patient Care Plan: CCM Pharmacy Care Plan    Problem Identified: HTN DM Osteoporosis   Note:   HTN DM Osteoporosis   Long-Range Goal: Disease Management   Start Date: 01/07/2021  Expected End Date: 01/07/2022  This Visit's Progress: Not on track  Priority: High  Note:     Medication Assistance: None required.  Patient affirms current coverage meets needs. Patient's preferred pharmacy is:  [id number] - [place], [address] AT [address] [place] [address] Phone: [phone] Fax: [place] [address], [place] [address] Phone: [phone] Fax: [phone]  [id number] - [place], [place] [address] AT [place] [address] Phone: [phone] Fax: [place], [place] [address] Phone: [phone] Fax: [phone]  Uses pill box? Yes. Pt endorses 100% compliance. We discussed: Current pharmacy is preferred with insurance plan and patient is satisfied with pharmacy services. Patient decided to: Continue current medication management strategy Patient agrees to Care Plan and Follow-up.  Current Barriers:  . n/a  Pharmacist Clinical Goal(s):  [name] Kitchen Over the next 365 days, patient will achieve adherence to monitoring guidelines and medication adherence to achieve therapeutic efficacy . contact provider office for  questions/concerns as evidenced notation of same in electronic health record through collaboration with PharmD and provider.   Interventions: . 1:1 collaboration with [name], MD regarding development and update of comprehensive plan of care as evidenced by provider attestation and co-signature . Inter-disciplinary care team collaboration (see longitudinal plan of care) . Comprehensive medication review performed; medication list updated in electronic medical record  Hypertension (BP goal <130/80) -controlled -Current treatment: . Losartan 100 mg once daily . Diltiazem 120 mg 120 mg once daily . Hydrochlorothiazide 25 mg once daily -Current home readings: n/a from home, reports 119/70s from clinic.  -Current dietary habits: see DM -Current exercise habits: see DM -Denies hypotensive/hypertensive symptoms -Educated on BP goals and benefits of medications for prevention of heart attack, stroke and kidney damage; Daily salt intake goal < 2300 mg; Exercise goal of 150 minutes per week; -Counseled to monitor BP at home, document, and provide log at future appointments -Counseled on diet and exercise extensively Recommended to continue current medication  Osteopenia -controlled -last dexa 2019 with rec 3-5 yr f/u. -Current treatment: Calcium-vitamin D3 500mg -33mcg twice daily -Counseled on diet and exercise extensively Recommended to continue current medication  GERD -controlled -Current treatment: . Pantoprazole 20-40 mg once daily -Medications previously tried: rosuvastatin 10 mg once daily (intolerant) -Current dietary patterns: identifies lemon juice, chocolate and late night eating as potential triggers -Current exercise habits: see DM -Educated on acid reflux triggers -Counseled on diet and exercise extensively Recommended to continue current medication  Diabetes (A1c goal <7%) -controlled -Current medications: [name] Kitchen Metformin 1000 mg twice daily  -Medications  previously tried: n/a  -Current home glucose readings . fasting glucose: n/a . post prandial glucose: n/a -Denies hypoglycemic/hyperglycemic symptoms -Current  meal patterns: grazing more than   . drinks: crystal light, minimal coffee -Current exercise: limited due to pain in feet, walk out at stores.  -Educated onA1c and blood sugar goals; -Counseled to check feet daily and get yearly eye exams -Counseled on diet and exercise extensively Recommended to continue current medication  Patient Goals/Self-Care Activities . Over the next 365 days, patient will:  - take medications as prescribed target a minimum of 150 minutes of moderate intensity exercise weekly engage in dietary modifications by avoidance of reflux triggers as discussed      The patient verbalized understanding of instructions provided today and agreed to receive a mailed copy of patient instruction and/or educational materials. Telephone follow up appointment with pharmacy team member scheduled for: See next appointment with "Care Management Staff" under "What's Next" below.

## 2021-01-16 ENCOUNTER — Encounter: Payer: Self-pay | Admitting: Family Medicine

## 2021-01-16 ENCOUNTER — Other Ambulatory Visit: Payer: Self-pay

## 2021-01-16 ENCOUNTER — Ambulatory Visit (INDEPENDENT_AMBULATORY_CARE_PROVIDER_SITE_OTHER): Payer: Medicare Other | Admitting: Family Medicine

## 2021-01-16 VITALS — BP 122/78 | HR 70 | Temp 97.9°F | Resp 16 | Ht 66.0 in | Wt 207.4 lb

## 2021-01-16 DIAGNOSIS — T466X5A Adverse effect of antihyperlipidemic and antiarteriosclerotic drugs, initial encounter: Secondary | ICD-10-CM | POA: Diagnosis not present

## 2021-01-16 DIAGNOSIS — I10 Essential (primary) hypertension: Secondary | ICD-10-CM

## 2021-01-16 DIAGNOSIS — Z8739 Personal history of other diseases of the musculoskeletal system and connective tissue: Secondary | ICD-10-CM

## 2021-01-16 DIAGNOSIS — E1142 Type 2 diabetes mellitus with diabetic polyneuropathy: Secondary | ICD-10-CM

## 2021-01-16 DIAGNOSIS — K219 Gastro-esophageal reflux disease without esophagitis: Secondary | ICD-10-CM | POA: Diagnosis not present

## 2021-01-16 DIAGNOSIS — Z1231 Encounter for screening mammogram for malignant neoplasm of breast: Secondary | ICD-10-CM | POA: Diagnosis not present

## 2021-01-16 DIAGNOSIS — Z5329 Procedure and treatment not carried out because of patient's decision for other reasons: Secondary | ICD-10-CM | POA: Diagnosis not present

## 2021-01-16 DIAGNOSIS — G720 Drug-induced myopathy: Secondary | ICD-10-CM | POA: Diagnosis not present

## 2021-01-16 DIAGNOSIS — Z5320 Procedure and treatment not carried out because of patient's decision for unspecified reasons: Secondary | ICD-10-CM

## 2021-01-16 DIAGNOSIS — Z789 Other specified health status: Secondary | ICD-10-CM | POA: Insufficient documentation

## 2021-01-16 LAB — COMPREHENSIVE METABOLIC PANEL
ALT: 12 U/L (ref 0–35)
AST: 15 U/L (ref 0–37)
Albumin: 4 g/dL (ref 3.5–5.2)
Alkaline Phosphatase: 72 U/L (ref 39–117)
BUN: 21 mg/dL (ref 6–23)
CO2: 31 mEq/L (ref 19–32)
Calcium: 9.8 mg/dL (ref 8.4–10.5)
Chloride: 101 mEq/L (ref 96–112)
Creatinine, Ser: 1.07 mg/dL (ref 0.40–1.20)
GFR: 52.19 mL/min — ABNORMAL LOW (ref >60.00)
Glucose, Bld: 123 mg/dL — ABNORMAL HIGH (ref 70–99)
Potassium: 3.9 mEq/L (ref 3.5–5.1)
Sodium: 139 mEq/L (ref 135–145)
Total Bilirubin: 0.9 mg/dL (ref 0.2–1.2)
Total Protein: 7.4 g/dL (ref 6.0–8.3)

## 2021-01-16 LAB — CBC WITH DIFFERENTIAL/PLATELET
Basophils Absolute: 0 10*3/uL (ref 0.0–0.1)
Basophils Relative: 0.4 % (ref 0.0–3.0)
Eosinophils Absolute: 0.3 10*3/uL (ref 0.0–0.7)
Eosinophils Relative: 3.6 % (ref 0.0–5.0)
HCT: 36.8 % (ref 36.0–46.0)
Hemoglobin: 12.1 g/dL (ref 12.0–15.0)
Lymphocytes Relative: 27.3 % (ref 12.0–46.0)
Lymphs Abs: 2 10*3/uL (ref 0.7–4.0)
MCHC: 32.8 g/dL (ref 30.0–36.0)
MCV: 89.2 fl (ref 78.0–100.0)
Monocytes Absolute: 0.7 10*3/uL (ref 0.1–1.0)
Monocytes Relative: 10.2 % (ref 3.0–12.0)
Neutro Abs: 4.3 10*3/uL (ref 1.4–7.7)
Neutrophils Relative %: 58.5 % (ref 43.0–77.0)
Platelets: 285 10*3/uL (ref 150.0–400.0)
RBC: 4.12 Mil/uL (ref 3.87–5.11)
RDW: 14 % (ref 11.5–15.5)
WBC: 7.3 10*3/uL (ref 4.0–10.5)

## 2021-01-16 LAB — LIPID PANEL
Cholesterol: 197 mg/dL (ref 0–200)
HDL: 41.6 mg/dL (ref >39.00)
NonHDL: 155.47
Total CHOL/HDL Ratio: 5
Triglycerides: 285 mg/dL — ABNORMAL HIGH (ref 0.0–149.0)
VLDL: 57 mg/dL — ABNORMAL HIGH (ref 0.0–40.0)

## 2021-01-16 LAB — HEMOGLOBIN A1C: Hgb A1c MFr Bld: 7.2 % — ABNORMAL HIGH (ref 4.6–6.5)

## 2021-01-16 LAB — LDL CHOLESTEROL, DIRECT: Direct LDL: 117 mg/dL

## 2021-01-16 MED ORDER — METFORMIN HCL 1000 MG PO TABS
1000.0000 mg | ORAL_TABLET | Freq: Two times a day (BID) | ORAL | 3 refills | Status: DC
Start: 2021-01-16 — End: 2021-04-21

## 2021-01-16 MED ORDER — GABAPENTIN 600 MG PO TABS: 600.0000 mg | ORAL_TABLET | Freq: Two times a day (BID) | ORAL | 3 refills | Status: DC

## 2021-01-16 MED ORDER — DILTIAZEM HCL ER COATED BEADS 120 MG PO CP24: 120.0000 mg | ORAL_CAPSULE | Freq: Every day | ORAL | 3 refills | Status: DC

## 2021-01-16 MED ORDER — HYDROCHLOROTHIAZIDE 25 MG PO TABS: 25.0000 mg | ORAL_TABLET | Freq: Every day | ORAL | 3 refills | Status: DC

## 2021-01-16 MED ORDER — LOSARTAN POTASSIUM 100 MG PO TABS: 100.0000 mg | ORAL_TABLET | Freq: Every day | ORAL | 3 refills | Status: DC

## 2021-01-16 MED ORDER — GABAPENTIN 600 MG PO TABS: 600.0000 mg | ORAL_TABLET | Freq: Two times a day (BID) | ORAL | Status: DC

## 2021-01-16 NOTE — Progress Notes (Signed)
Subjective  Chief Complaint  Patient presents with  . Annual Exam    Fasting   . Diabetes  . Hypertension  . Peripheral Neuropathy  . Health Maintenance    Refused Shingrix script, stating vaccines are "too expensive"     HPI: Marilyn Myers is a 70 y.o. female who presents to [facility] [place] today for a Female Wellness Visit. She also has the concerns and/or needs as listed above in the chief complaint. These will be addressed in addition to the Health Maintenance Visit.   Wellness Visit: annual visit with health maintenance review and exam without Pap   HM: sees gyn. Reviewed last dexa: h/o osteopenia now normalized by last reading. Doing well overall.   Chronic disease f/u and/or acute problem visit: (deemed necessary to be done in addition to the wellness visit):  Diabetes follow up: Her diabetic control is reported as Unchanged. Denies sxs of hyperglycemia.  She denies exertional CP or SOB or symptomatic hypoglycemia. She denies foot sores and controls painful paresthesias with pain mgt medications.tolerating metformin  HTN: Feeling well. Taking medications w/o adverse effects. No symptoms of CHF, angina; no palpitations, sob, cp or lower extremity edema. Compliant with meds. On ARB and hctz  HLD: statin intolerant and pt refusal.   GERD is controlled on chronic PPI   Immunization History  Administered Date(s) Administered  . Fluad Quad(high Dose 65+) 08/27/2019  . Influenza, High Dose Seasonal PF 08/19/2014, 09/02/2015, 10/08/2016, 09/22/2017, 08/29/2018  . Influenza-Unspecified 10/13/2020  . PFIZER(Purple Top)SARS-COV-2 Vaccination 12/29/2019, 01/18/2020, 10/13/2020  . Pneumococcal Conjugate-13 10/18/2016  . Pneumococcal Polysaccharide-23 08/19/2014    Diabetes Related Lab Review: Lab Results  Component Value Date   HGBA1C 7.2 (H) 01/16/2021   HGBA1C 6.7 (A) 04/30/2020   HGBA1C 6.7 (A) 01/07/2020    No results found for: Derl Barrow Lab Results  Component Value Date   CREATININE 1.05 (H) 01/22/2021   BUN 18 01/22/2021   NA 138 01/22/2021   K 3.4 (L) 01/22/2021   CL 101 01/22/2021   CO2 24 01/22/2021   Lab Results  Component Value Date   CHOL 197 01/16/2021   CHOL 200 08/27/2019   CHOL 184 11/06/2018   Lab Results  Component Value Date   HDL 41.60 01/16/2021   HDL 41.30 08/27/2019   HDL 40 11/06/2018   Lab Results  Component Value Date   LDLCALC 87 11/06/2018   Lab Results  Component Value Date   TRIG 285.0 (H) 01/16/2021   TRIG 392.0 (H) 08/27/2019   TRIG 284 (A) 11/06/2018   Lab Results  Component Value Date   CHOLHDL 5 01/16/2021   CHOLHDL 5 08/27/2019   Lab Results  Component Value Date   LDLDIRECT 117.0 01/16/2021   LDLDIRECT 115.0 08/27/2019   The 10-year ASCVD risk score Mikey Bussing DC Jr., et al., 2013) is: 24.4%   Values used to calculate the score:     Age: 29 years     Sex: Female     Is Non-Hispanic African American: No     Diabetic: Yes     Tobacco smoker: No     Systolic Blood Pressure: 448 mmHg     Is BP treated: Yes     HDL Cholesterol: 41.6 mg/dL     Total Cholesterol: 197 mg/dL  BP Readings from Last 3 Encounters:  01/22/21 123/78  01/16/21 122/78  11/03/20 113/69   Wt Readings from Last 3 Encounters:  01/22/21 205 lb (93 kg)  01/16/21 207 lb 6.4 oz (94.1 kg)  11/03/20 205 lb (93 kg)    Health Maintenance  Topic Date Due  . MAMMOGRAM  12/11/2021 (Originally 05/31/2019)  . HEMOGLOBIN A1C  07/16/2021  . OPHTHALMOLOGY EXAM  08/25/2021  . FOOT EXAM  01/16/2022  . DEXA SCAN  07/05/2023  . COLONOSCOPY (Pts 45-41yrs Insurance coverage will need to be confirmed)  06/28/2028  . INFLUENZA VACCINE  Completed  . COVID-19 Vaccine  Completed  . PNA vac Low Risk Adult  Completed  . HPV VACCINES  Aged Out  . Hepatitis C Screening  Discontinued     Assessment  1. Controlled type 2 diabetes mellitus with diabetic polyneuropathy, without long-term current use of  insulin ([place])   2. Diabetic peripheral neuropathy ([place])   3. Essential hypertension   4. Gastroesophageal reflux disease without esophagitis   5. Statin myopathy   6. Refusal of statin medication by patient   7. History of osteopenia      Plan  Female Wellness Visit:  Age appropriate Health Maintenance and Prevention measures were discussed with patient. Included topics are cancer screening recommendations, ways to keep healthy (see AVS) including dietary and exercise recommendations, regular eye and dental care, use of seat belts, and avoidance of moderate alcohol use and tobacco use.  Defers mammogram.  BMI: discussed patient's BMI and encouraged positive lifestyle modifications to help get to or maintain a target BMI.  HM needs and immunizations were addressed and ordered. See below for orders. See HM and immunization section for updates.  Routine labs and screening tests ordered including cmp, cbc and lipids where appropriate.  Discussed recommendations regarding Vit D and calcium supplementation (see AVS)  Chronic disease management visit and/or acute problem visit:  HTN: well controlled. Continue losartan 100 and hctz 25 daily  DM: recheck a1c; will need to improve diet - recheck 3 months and adjust medications at that time if not improved: goal A1c < 7.0  HLD elevated above goal and statin intolerant. Doesn't want medications. Doubt repatha candidate  gerd on chronic PPI.    Follow up: 3 months for diabetes recheck.   Orders Placed This Encounter  Procedures  . CBC with Differential/Platelet  . Comprehensive metabolic panel  . Lipid panel  . Hemoglobin A1c  . LDL cholesterol, direct   Meds ordered this encounter  Medications  . DISCONTD: gabapentin (NEURONTIN) 600 MG tablet    Sig: Take 1 tablet (600 mg total) by mouth 2 (two) times daily.  [name] Kitchen diltiazem (CARDIZEM CD) 120 MG 24 hr capsule    Sig: Take 1 capsule (120 mg total) by mouth daily.    Dispense:  90  capsule    Refill:  3  . gabapentin (NEURONTIN) 600 MG tablet    Sig: Take 1 tablet (600 mg total) by mouth 2 (two) times daily.    Dispense:  180 tablet    Refill:  3  . hydrochlorothiazide (HYDRODIURIL) 25 MG tablet    Sig: Take 1 tablet (25 mg total) by mouth daily.    Dispense:  90 tablet    Refill:  3  . losartan (COZAAR) 100 MG tablet    Sig: Take 1 tablet (100 mg total) by mouth daily.    Dispense:  90 tablet    Refill:  3  . metFORMIN (GLUCOPHAGE) 1000 MG tablet    Sig: Take 1 tablet (1,000 mg total) by mouth 2 (two) times daily.    Dispense:  180 tablet    Refill:  3      Body mass index is 33.48 kg/m. Wt Readings from Last 3 Encounters:  01/22/21 205 lb (93 kg)  01/16/21 207 lb 6.4 oz (94.1 kg)  11/03/20 205 lb (93 kg)     Patient Active Problem List   Diagnosis Date Noted  . Gastroesophageal reflux disease without esophagitis 01/07/2020  . Chronic narcotic use 01/07/2020  . Obesity (BMI 30-39.9) 02/22/2019  . Refusal of statin medication by patient 01/24/2018    Husband has problems with this medication for her   . Seasonal allergic rhinitis 01/24/2018  . PVC's (premature ventricular contractions) 10/08/2016  . Diabetic peripheral neuropathy ([place]) 09/28/2016    Failed lyrica   . S/P colostomy takedown 07/02/2015  . History of osteopenia 05/19/2015     T = -1.4 Dexa 2016.  Dexa 2019 at GYN: normal at all sites. See records   . Essential hypertension 01/28/2015  . Diverticulitis of large intestine 08/19/2014    Overview:  2015   . Hydradenitis 08/19/2014  . Nephrolithiasis 08/19/2014    Overview:  Dr. Kellie Simmering   . Obstructive sleep apnea 08/01/2014  . Controlled type 2 diabetes mellitus with diabetic polyneuropathy, without long-term current use of insulin ([place]) 06/10/2014   Health Maintenance  Topic Date Due  . MAMMOGRAM  12/11/2021 (Originally 05/31/2019)  . HEMOGLOBIN A1C  07/16/2021  . OPHTHALMOLOGY EXAM  08/25/2021  . FOOT EXAM  01/16/2022   . DEXA SCAN  07/05/2023  . COLONOSCOPY (Pts 45-84yrs Insurance coverage will need to be confirmed)  06/28/2028  . INFLUENZA VACCINE  Completed  . COVID-19 Vaccine  Completed  . PNA vac Low Risk Adult  Completed  . HPV VACCINES  Aged Out  . Hepatitis C Screening  Discontinued   Immunization History  Administered Date(s) Administered  . Fluad Quad(high Dose 65+) 08/27/2019  . Influenza, High Dose Seasonal PF 08/19/2014, 09/02/2015, 10/08/2016, 09/22/2017, 08/29/2018  . Influenza-Unspecified 10/13/2020  . PFIZER(Purple Top)SARS-COV-2 Vaccination 12/29/2019, 01/18/2020, 10/13/2020  . Pneumococcal Conjugate-13 10/18/2016  . Pneumococcal Polysaccharide-23 08/19/2014   We updated and reviewed the patient's past history in detail and it is documented below. Allergies: Patient is allergic to ace inhibitors, codeine, and flomax [tamsulosin hcl]. Past Medical History Patient  has a past medical history of Anxiety, Diverticulitis of intestine without perforation or abscess, Diverticulosis, sigmoid, GERD (gastroesophageal reflux disease), History of diverticulitis of colon, History of kidney stones, Hypertension, Nephrolithiasis, OSA on CPAP, Right ureteral stone, Seasonal allergic rhinitis (01/24/2018), and Type 2 diabetes mellitus ([place]). Past Surgical History Patient  has a past surgical history that includes Cervical cone biopsy (1976); Extracorporeal shock wave lithotripsy (x3  last one 01-24-2012); Tubal ligation (1987); Cystoscopy/retrograde/ureteroscopy (10/08/2011); URETEROSCPIC STONE EXTRACTION/ STENT PLACEMENT (left 08-20-2011); CYSTO/  BILATERAL RETROGRADE PYELOGRAM/  PLACEMENT BILATERAL URETERAL STENTS (07-10-2011); Cystoscopy with retrograde pyelogram, ureteroscopy and stent placement (Right, 10/11/2014); Colon resection; and Bariatric Surgery. Family History: Patient family history includes Breast cancer in her mother; Diabetes in her maternal aunt and paternal grandmother; Heart disease in  her mother; Renal Disease in her mother. Social History:  Patient  reports that she quit smoking about 24 years ago. Her smoking use included cigarettes. She has a 25.00 pack-year smoking history. She has never used smokeless tobacco. She reports previous alcohol use. She reports that she does not use drugs.  Review of Systems: Constitutional: negative for fever or malaise Ophthalmic: negative for photophobia, double vision or loss of vision Cardiovascular: negative for chest pain, dyspnea on exertion, or new LE swelling Respiratory:  negative for SOB or persistent cough Gastrointestinal: negative for abdominal pain, change in bowel habits or melena Genitourinary: negative for dysuria or gross hematuria, no abnormal uterine bleeding or disharge Musculoskeletal: negative for new gait disturbance or muscular weakness Integumentary: negative for new or persistent rashes, no breast lumps Neurological: negative for TIA or stroke symptoms Psychiatric: negative for SI or delusions Allergic/Immunologic: negative for hives  Patient Care Team    Relationship Specialty Notifications Start End  [name], MD PCP - General Family Medicine  07/26/14   Dasher, Jeneen Rinks, MD  Surgery  12/01/17   Princess Bruins, MD Consulting Physician Obstetrics and Gynecology  12/01/17   Juluis Rainier  Optometry  08/27/19   Harriett Sine, MD Consulting Physician Dermatology  12/10/19   Roche, Christian Mate, PA-C Physician Assistant Pain Medicine  12/10/19   Madelin Rear, [facility] Pharmacist Pharmacist  06/20/20    Comment: [phone]    Objective  Vitals: BP 122/78   Pulse 70   Temp 97.9 F (36.6 C) (Temporal)   Resp 16   Ht 5\' 6"  (1.676 m)   Wt 207 lb 6.4 oz (94.1 kg)   SpO2 97%   BMI 33.48 kg/m  General:  Well developed, well nourished, no acute distress  Psych:  Alert and orientedx3,normal mood and affect HEENT:  Normocephalic, atraumatic, non-icteric sclera,  supple neck without adenopathy, mass or  thyromegaly Cardiovascular:  Normal S1, S2, RRR without gallop, rub or murmur Respiratory:  Good breath sounds bilaterally, CTAB with normal respiratory effort Gastrointestinal: normal bowel sounds, soft, non-tender, no noted masses. No HSM MSK: no deformities, contusions. Joints are without erythema or swelling.  Skin:  Warm, no rashes or suspicious lesions noted Neurologic:    Mental status is normal. CN 2-11 are normal. Gross motor and sensory exams are normal. Normal gait. No tremor Diabetic foot: no lesions, decreased sensation nl pulses   Commons side effects, risks, benefits, and alternatives for medications and treatment plan prescribed today were discussed, and the patient expressed understanding of the given instructions. Patient is instructed to call or message via MyChart if he/she has any questions or concerns regarding our treatment plan. No barriers to understanding were identified. We discussed Red Flag symptoms and signs in detail. Patient expressed understanding regarding what to do in case of urgent or emergency type symptoms.   Medication list was reconciled, printed and provided to the patient in AVS. Patient instructions and summary information was reviewed with the patient as documented in the AVS. This note was prepared with assistance of Dragon voice recognition software. Occasional wrong-word or sound-a-like substitutions may have occurred due to the inherent limitations of voice recognition software  This visit occurred during the SARS-CoV-2 public health emergency.  Safety protocols were in place, including screening questions prior to the visit, additional usage of staff PPE, and extensive cleaning of exam room while observing appropriate contact time as indicated for disinfecting solutions.

## 2021-01-16 NOTE — Patient Instructions (Signed)
Please return in 6 months for recheck diabetes and hypertension.  I will send your RXs to [facility].  If you have any questions or concerns, please don't hesitate to send me a message via MyChart or call the office at [phone]. Thank you for visiting with [place] today! It's our pleasure caring for you.

## 2021-01-21 ENCOUNTER — Telehealth: Payer: Self-pay

## 2021-01-21 NOTE — Chronic Care Management (AMB) (Signed)
Chronic Care Management Pharmacy Assistant   Name: Marilyn Myers  MRN: [id number] DOB: 10-Jul-1951  Reason for Encounter: Disease State/ General Adherence Call  PCP : [name], MD  Allergies:   Allergies  Allergen Reactions  . Ace Inhibitors Cough  . Codeine Nausea And Vomiting  . Flomax [Tamsulosin Hcl] Itching    Medications: Outpatient Encounter Medications as of 01/21/2021  Medication Sig  . Biotin 5000 MCG CAPS Take by mouth.  . Calcium Carbonate-Vitamin D (CALCIUM-VITAMIN D3 PO) Take by mouth. 500-25 mg-mcg twice daily  . Cholecalciferol (VITAMIN D3) 1000 UNITS CAPS Take 1 capsule by mouth daily.  . Cyanocobalamin (VITAMIN B 12 PO) Take by mouth.  . diltiazem (CARDIZEM CD) 120 MG 24 hr capsule Take 1 capsule (120 mg total) by mouth daily.  [name] Kitchen FA-B6-B12-Omega 3-Phytosterols (BP VIT 3 PO) Take by mouth.  . FIBER ADULT GUMMIES 2 g CHEW Chew by mouth.  . gabapentin (NEURONTIN) 600 MG tablet Take 1 tablet (600 mg total) by mouth 2 (two) times daily.  . hydrochlorothiazide (HYDRODIURIL) 25 MG tablet Take 1 tablet (25 mg total) by mouth daily.  [name] Kitchen HYDROcodone-acetaminophen (NORCO) 10-325 MG tablet Take 1 tablet by mouth every 6 (six) hours as needed.  [name] Kitchen losartan (COZAAR) 100 MG tablet Take 1 tablet (100 mg total) by mouth daily.  . metFORMIN (GLUCOPHAGE) 1000 MG tablet Take 1 tablet (1,000 mg total) by mouth 2 (two) times daily.  . Multiple Vitamin (MULTIVITAMIN) capsule Take by mouth.  . ondansetron (ZOFRAN ODT) 4 MG disintegrating tablet Take 1 tablet (4 mg total) by mouth every 8 (eight) hours as needed for nausea or vomiting.  . pantoprazole (PROTONIX) 20 MG tablet TAKE 1 TO 2 TABLETS (20-40 MG TOTAL) BY MOUTH DAILY.   No facility-administered encounter medications on file as of 01/21/2021.    Current Diagnosis: Patient Active Problem List   Diagnosis Date Noted  . Gastroesophageal reflux disease without esophagitis 01/07/2020  . Chronic narcotic use 01/07/2020  .  Obesity (BMI 30-39.9) 02/22/2019  . Refusal of statin medication by patient 01/24/2018  . Seasonal allergic rhinitis 01/24/2018  . PVC's (premature ventricular contractions) 10/08/2016  . Diabetic peripheral neuropathy ([place]) 09/28/2016  . S/P colostomy takedown 07/02/2015  . History of osteopenia 05/19/2015  . Essential hypertension 01/28/2015  . Diverticulitis of large intestine 08/19/2014  . Hydradenitis 08/19/2014  . Nephrolithiasis 08/19/2014  . Obstructive sleep apnea 08/01/2014  . Controlled type 2 diabetes mellitus with diabetic polyneuropathy, without long-term current use of insulin ([place]) 06/10/2014    Reviewed chart for medication changes ahead of disease state call.  01/16/2021 OV PCP Dr. Jonni Sanger; chronic follow up, no medication changes indicated.  Have you had any problems recently with your health? Patient states she is currently having "a kidney stone problem right now". Patient states "I am not passing much urine either".  How is your acid reflux? Are you avoiding triggers such as chocolate, lemon juice, eating late at night? Patient states she is avoiding these triggers. However, patient states her acid reflux is still not well controlled with Pantoprazole alone. Patient states she takes Tums occasionally and that seems to help some.  Have you had any problems with your pharmacy? Patient states she has not had any problems recently with her pharmacy.  What issues or side effects are you having with your medications? Patient states she is not currently having any issues or side effects from any of her medications at this time.  What would you  like me to pass along to Madelin Rear, CPP for him to help you with?  Patient states she is having a lot of pain in her "right kidney". Patient states she has a history of kidney stones and is sure she has one right now. Patient would like for Madelin Rear, CPP to send a message to Dr. Jonni Sanger to see if she can get an appointment for  further evaluation of her kidney problem. Patient states she is in so much pain she may go to the ER.  What can we do to take care of you better? Patient states right now she would like for [place] to help her get an appointment with Dr. Jonni Sanger for her kidney pain. Patient states she did have this pain during her last follow-up appointment on 01/16/2021. Patient states she thought she had pulled a muscle at the time. Patient states since then her pain has worsened significantly.  Future Appointments  Date Time Provider [place]  12/17/2021  3:15 PM [facility]    Patient declines to schedule a follow-up with clinical pharmacist Madelin Rear, CPP at this time.  April D Calhoun, [place] Pharmacist Assistant [phone]    Follow-Up:  Pharmacist Review

## 2021-01-22 ENCOUNTER — Emergency Department (HOSPITAL_COMMUNITY): Payer: Medicare Other

## 2021-01-22 ENCOUNTER — Encounter (HOSPITAL_COMMUNITY): Payer: Self-pay

## 2021-01-22 ENCOUNTER — Emergency Department (HOSPITAL_COMMUNITY)
Admission: EM | Admit: 2021-01-22 | Discharge: 2021-01-22 | Disposition: A | Discharge status: Home / Self Care | Payer: Medicare Other | Attending: Emergency Medicine | Admitting: Emergency Medicine

## 2021-01-22 ENCOUNTER — Other Ambulatory Visit: Payer: Self-pay

## 2021-01-22 DIAGNOSIS — N202 Calculus of kidney with calculus of ureter: Secondary | ICD-10-CM | POA: Insufficient documentation

## 2021-01-22 DIAGNOSIS — N200 Calculus of kidney: Secondary | ICD-10-CM

## 2021-01-22 DIAGNOSIS — Z7984 Long term (current) use of oral hypoglycemic drugs: Secondary | ICD-10-CM | POA: Diagnosis not present

## 2021-01-22 DIAGNOSIS — N390 Urinary tract infection, site not specified: Secondary | ICD-10-CM | POA: Diagnosis not present

## 2021-01-22 DIAGNOSIS — Z79899 Other long term (current) drug therapy: Secondary | ICD-10-CM | POA: Diagnosis not present

## 2021-01-22 DIAGNOSIS — L905 Scar conditions and fibrosis of skin: Secondary | ICD-10-CM | POA: Diagnosis not present

## 2021-01-22 DIAGNOSIS — K219 Gastro-esophageal reflux disease without esophagitis: Secondary | ICD-10-CM | POA: Diagnosis not present

## 2021-01-22 DIAGNOSIS — N281 Cyst of kidney, acquired: Secondary | ICD-10-CM | POA: Diagnosis not present

## 2021-01-22 DIAGNOSIS — Z87891 Personal history of nicotine dependence: Secondary | ICD-10-CM | POA: Insufficient documentation

## 2021-01-22 DIAGNOSIS — K8020 Calculus of gallbladder without cholecystitis without obstruction: Secondary | ICD-10-CM | POA: Diagnosis not present

## 2021-01-22 DIAGNOSIS — E1140 Type 2 diabetes mellitus with diabetic neuropathy, unspecified: Secondary | ICD-10-CM | POA: Diagnosis not present

## 2021-01-22 DIAGNOSIS — I10 Essential (primary) hypertension: Secondary | ICD-10-CM | POA: Diagnosis not present

## 2021-01-22 DIAGNOSIS — N201 Calculus of ureter: Secondary | ICD-10-CM | POA: Diagnosis not present

## 2021-01-22 DIAGNOSIS — R109 Unspecified abdominal pain: Secondary | ICD-10-CM | POA: Diagnosis present

## 2021-01-22 LAB — CBC WITH DIFFERENTIAL/PLATELET
Abs Immature Granulocytes: 0.02 10*3/uL (ref 0.00–0.07)
Basophils Absolute: 0 10*3/uL (ref 0.0–0.1)
Basophils Relative: 0 %
Eosinophils Absolute: 0.3 10*3/uL (ref 0.0–0.5)
Eosinophils Relative: 3 %
HCT: 38.3 % (ref 36.0–46.0)
Hemoglobin: 12.1 g/dL (ref 12.0–15.0)
Immature Granulocytes: 0 %
Lymphocytes Relative: 16 %
Lymphs Abs: 1.6 10*3/uL (ref 0.7–4.0)
MCH: 29 pg (ref 26.0–34.0)
MCHC: 31.6 g/dL (ref 30.0–36.0)
MCV: 91.8 fL (ref 80.0–100.0)
Monocytes Absolute: 0.8 10*3/uL (ref 0.1–1.0)
Monocytes Relative: 8 %
Neutro Abs: 7.1 10*3/uL (ref 1.7–7.7)
Neutrophils Relative %: 73 %
Platelets: 283 10*3/uL (ref 150–400)
RBC: 4.17 MIL/uL (ref 3.87–5.11)
RDW: 13.3 % (ref 11.5–15.5)
WBC: 9.8 10*3/uL (ref 4.0–10.5)
nRBC: 0 % (ref 0.0–0.2)

## 2021-01-22 LAB — URINALYSIS, ROUTINE W REFLEX MICROSCOPIC
Bilirubin Urine: NEGATIVE
Glucose, UA: NEGATIVE mg/dL
Ketones, ur: NEGATIVE mg/dL
Nitrite: POSITIVE — AB
Protein, ur: NEGATIVE mg/dL
Specific Gravity, Urine: 1.017 (ref 1.005–1.030)
WBC, UA: >50 WBC/hpf — ABNORMAL HIGH (ref 0–5)
pH: 5 (ref 5.0–8.0)

## 2021-01-22 LAB — BASIC METABOLIC PANEL
Anion gap: 13 (ref 5–15)
BUN: 18 mg/dL (ref 8–23)
CO2: 24 mmol/L (ref 22–32)
Calcium: 9.2 mg/dL (ref 8.9–10.3)
Chloride: 101 mmol/L (ref 98–111)
Creatinine, Ser: 1.05 mg/dL — ABNORMAL HIGH (ref 0.44–1.00)
GFR, Estimated: 57 mL/min — ABNORMAL LOW (ref >60)
Glucose, Bld: 153 mg/dL — ABNORMAL HIGH (ref 70–99)
Potassium: 3.4 mmol/L — ABNORMAL LOW (ref 3.5–5.1)
Sodium: 138 mmol/L (ref 135–145)

## 2021-01-22 MED ORDER — ONDANSETRON HCL 4 MG/2ML IJ SOLN
4.0000 mg | Freq: Once | INTRAMUSCULAR | Status: AC
Start: 2021-01-22 — End: 2021-01-22
  Administered 2021-01-22: 4 mg via INTRAVENOUS
  Filled 2021-01-22: qty 2

## 2021-01-22 MED ORDER — SODIUM CHLORIDE 0.9 % IV SOLN
1.0000 g | Freq: Once | INTRAVENOUS | Status: AC
  Administered 2021-01-22: 1 g via INTRAVENOUS
  Filled 2021-01-22: qty 10

## 2021-01-22 MED ORDER — CEPHALEXIN 500 MG PO CAPS: 500.0000 mg | ORAL_CAPSULE | Freq: Four times a day (QID) | ORAL | 0 refills | Status: AC

## 2021-01-22 MED ORDER — MORPHINE SULFATE (PF) 4 MG/ML IV SOLN
4.0000 mg | Freq: Once | INTRAVENOUS | Status: AC
  Administered 2021-01-22: 4 mg via INTRAVENOUS
  Filled 2021-01-22: qty 1

## 2021-01-22 MED ORDER — KETOROLAC TROMETHAMINE 30 MG/ML IJ SOLN
30.0000 mg | Freq: Once | INTRAMUSCULAR | Status: AC
  Administered 2021-01-22: 30 mg via INTRAVENOUS
  Filled 2021-01-22: qty 1

## 2021-01-22 NOTE — ED Notes (Signed)
Gave pt heating pack for flank/back pain

## 2021-01-22 NOTE — Discharge Instructions (Signed)
You were evaluated in the emergency department today for your right-sided flank pain and your urinary symptoms.  You are found to have urinary tract infection as well as stones in both of your kidneys and in your right lower abdomen in your ureter, where you are having pain.  Fortunately, the stones are not obstructing the flow of your kidneys and therefore do not require any emergent intervention.  You were administered your first dose of antibiotics in the ED and have been prescribed antibiotics at home for the next week. You should take these in their entirety as prescribed.  Below the contact information for urologist, Dr. Abner Greenspan, who is on-call today.  He would like to see you in the office for ER follow-up.  It is possible you will require lithotripsy again given there is been no movement of the stone in your right ureter (your lower abdomen) since 10/2020.  Return the emergency department immediately if you develop any fevers at home, as you may require more urgent urology intervention.   Return to the emergency department develop any worsening abdominal pain, nausea or vomiting that does not stop, or any other new severe symptoms.  Please contact the provider with whom you have a pain contract should you require any further pain medication at home.

## 2021-01-22 NOTE — ED Triage Notes (Signed)
Patient c/o constant right flank pain x 1 week. Patient reports a history of kidney stones.

## 2021-01-22 NOTE — Progress Notes (Signed)
Labs reviewed. Stable labs to be discussed at next visit. No med changes.  Visit date not found

## 2021-01-22 NOTE — ED Provider Notes (Signed)
[facility] DEPT Provider Note   CSN: [id number] Arrival date & time: 01/22/21  0854     History Chief Complaint  Patient presents with  . Flank Pain    Marilyn Myers is a 70 y.o. female who presents with concern for 1 week of right-sided flank pain and decreased urine.  Patient has history of recurrent nephrolithiasis, and states this pain feels similar.  She also feels that she has pain in her right lower quadrant she feels like has been a stone that has been passing for last few days.  Denies any hematuria, dysuria, urinary urgency, but does endorse frequency on her HCTZ.  Most recent bowel movement was 2 days ago, denies melena or hematochezia. Denies fevers or chills.   Denies any chest pain or shortness of breath, denies fevers or chills at home.  She does endorse history of UTI as well.  Personally reviewed this patient's medical records.  She is history of hypertension, type 2 diabetes, diverticulosis with diverticulitis, GERD, nephrolithiasis, and OSA.  She is extensive history of nephrolithiasis requiring multiple procedures including lithotripsy in the past, most notably in 2012.  She has not followed with a urologist in quite some time, and currently has not established with a urology practice.  HPI     Past Medical History:  Diagnosis Date  . Anxiety   . Diverticulitis of intestine without perforation or abscess   . Diverticulosis, sigmoid   . GERD (gastroesophageal reflux disease)   . History of diverticulitis of colon    09/ 2015  RESOLVED   . History of kidney stones   . Hypertension   . Nephrolithiasis    BILATERAL  . OSA on CPAP    wears cpap  . Right ureteral stone   . Seasonal allergic rhinitis 01/24/2018  . Type 2 diabetes mellitus [facility])     Patient Active Problem List   Diagnosis Date Noted  . Gastroesophageal reflux disease without esophagitis 01/07/2020  . Chronic narcotic use 01/07/2020  . Obesity (BMI 30-39.9)  02/22/2019  . Refusal of statin medication by patient 01/24/2018  . Seasonal allergic rhinitis 01/24/2018  . PVC's (premature ventricular contractions) 10/08/2016  . Diabetic peripheral neuropathy ([place]) 09/28/2016  . S/P colostomy takedown 07/02/2015  . History of osteopenia 05/19/2015  . Essential hypertension 01/28/2015  . Diverticulitis of large intestine 08/19/2014  . Hydradenitis 08/19/2014  . Nephrolithiasis 08/19/2014  . Obstructive sleep apnea 08/01/2014  . Controlled type 2 diabetes mellitus with diabetic polyneuropathy, without long-term current use of insulin ([place]) 06/10/2014    Past Surgical History:  Procedure Laterality Date  . BARIATRIC SURGERY    . CERVICAL CONE BIOPSY  1976  . COLON RESECTION     colostomy x 6 months  . CYSTO/  BILATERAL RETROGRADE PYELOGRAM/  PLACEMENT BILATERAL URETERAL STENTS  07-10-2011  . CYSTOSCOPY WITH RETROGRADE PYELOGRAM, URETEROSCOPY AND STENT PLACEMENT Right 10/11/2014   Procedure: CYSTOSCOPY WITH RETROGRADE PYELOGRAM, URETEROSCOPY, right ureteral biopsy, AND STENT PLACEMENT;  Surgeon: Arvil Persons, MD;  Location: [facility];  Service: Urology;  Laterality: Right;  . CYSTOSCOPY/RETROGRADE/URETEROSCOPY  10/08/2011   Procedure: CYSTOSCOPY/RETROGRADE/URETEROSCOPY;  Surgeon: Hanley Ben, MD;  Location[facility];  Service: Urology;  Laterality: N/A;  CYSTOSCOPY, RIGHT  RETROGRADE, RIGHT URETEROSCOPY HOLMIUM LASER WITH BASKET STONE EXTRACTION AND RIGHT URETERAL STENT PLACEMENT  . EXTRACORPOREAL SHOCK WAVE LITHOTRIPSY  x3  last one 01-24-2012  . TUBAL LIGATION  1987  . [place]  left 08-20-2011     OB History    Gravida  3   Para  3   Term      Preterm      AB      Living  3     SAB      IAB      Ectopic      Multiple      Live Births              Family History  Problem Relation Age of Onset  . Breast cancer Mother        108  . Renal Disease  Mother   . Heart disease Mother   . Diabetes Paternal Grandmother   . Diabetes Maternal Aunt     Social History   Tobacco Use  . Smoking status: Former Smoker    Packs/day: 1.00    Years: 25.00    Pack years: 25.00    Types: Cigarettes    Quit date: 10/07/1996    Years since quitting: 24.3  . Smokeless tobacco: Never Used  Vaping Use  . Vaping Use: Never used  Substance Use Topics  . Alcohol use: Not Currently  . Drug use: No    Home Medications Prior to Admission medications   Medication Sig Start Date End Date Taking? Authorizing Provider  Ascorbic Acid (VITAMIN C PO) Take 1 tablet by mouth daily.   Yes [provider]  Biotin 5000 MCG CAPS Take 5,000 mcg by mouth daily.   Yes [provider]  Calcium Carbonate-Vitamin D (CALCIUM-VITAMIN D3 PO) Take 1 tablet by mouth 2 (two) times daily.   Yes [provider]  cephALEXin (KEFLEX) 500 MG capsule Take 1 capsule (500 mg total) by mouth 4 (four) times daily for 7 days. 01/22/21 01/29/21 Yes Crescencio Jozwiak, Gypsy Balsam, PA-C  Cholecalciferol (VITAMIN D3) 1000 UNITS CAPS Take 1,000 Units by mouth daily.   Yes [provider]  Cyanocobalamin (VITAMIN B 12 PO) Take 1 tablet by mouth daily.   Yes [provider]  diltiazem (CARDIZEM CD) 120 MG 24 hr capsule Take 1 capsule (120 mg total) by mouth daily. 01/16/21  Yes [name], MD  FIBER ADULT GUMMIES 2 g CHEW Chew 2 tablets by mouth daily.   Yes [provider]  gabapentin (NEURONTIN) 600 MG tablet Take 1 tablet (600 mg total) by mouth 2 (two) times daily. 01/16/21  Yes [name], MD  hydrochlorothiazide (HYDRODIURIL) 25 MG tablet Take 1 tablet (25 mg total) by mouth daily. 01/16/21  Yes [name], MD  HYDROcodone-acetaminophen [facility]) 10-325 MG tablet Take 1 tablet by mouth 4 (four) times daily. 07/31/19  Yes [provider]  losartan (COZAAR) 100 MG tablet Take 1 tablet (100 mg total) by mouth daily. 01/16/21  Yes  [name], MD  metFORMIN (GLUCOPHAGE) 1000 MG tablet Take 1 tablet (1,000 mg total) by mouth 2 (two) times daily. 01/16/21  Yes [name], MD  Multiple Vitamin (MULTIVITAMIN) capsule Take 1 capsule by mouth daily.   Yes [provider]  pantoprazole (PROTONIX) 20 MG tablet TAKE 1 TO 2 TABLETS (20-40 MG TOTAL) BY MOUTH DAILY. Patient taking differently: Take 20 mg by mouth daily. 10/29/20  Yes [name], MD  ondansetron (ZOFRAN ODT) 4 MG disintegrating tablet Take 1 tablet (4 mg total) by mouth every 8 (eight) hours as needed for nausea or vomiting. Patient not taking: Reported on 01/22/2021 11/03/20   Antonietta Breach, PA-C  Allergies    Ace inhibitors, Codeine, and Flomax [tamsulosin hcl]  Review of Systems   Review of Systems  Constitutional: Positive for appetite change. Negative for activity change, chills, diaphoresis, fatigue and fever.  HENT: Negative.   Respiratory: Negative.   Cardiovascular: Negative.   Gastrointestinal: Positive for abdominal pain and nausea. Negative for blood in stool, constipation, diarrhea and vomiting.  Genitourinary: Positive for decreased urine volume, flank pain and frequency. Negative for difficulty urinating, dysuria, hematuria, pelvic pain, urgency, vaginal bleeding, vaginal discharge and vaginal pain.  Allergic/Immunologic: Positive for immunocompromised state.       DM type 2  Neurological: Negative.   Hematological: Negative.     Physical Exam Updated Vital Signs BP 123/78   Pulse 78   Temp 98.2 F (36.8 C) (Oral)   Resp (!) 23   Ht 5\' 6"  (1.676 m)   Wt 93 kg   SpO2 95%   BMI 33.09 kg/m   Physical Exam Vitals and nursing note reviewed.  Constitutional:      Appearance: She is obese.  HENT:     Head: Normocephalic and atraumatic.     Nose: Nose normal.     Mouth/Throat:     Mouth: Mucous membranes are moist.     Pharynx: Oropharynx is clear. Uvula midline. No oropharyngeal exudate or posterior oropharyngeal  erythema.  Eyes:     General:        Right eye: No discharge.        Left eye: No discharge.     Extraocular Movements: Extraocular movements intact.     Conjunctiva/sclera: Conjunctivae normal.     Pupils: Pupils are equal, round, and reactive to light.  Neck:     Trachea: Trachea and phonation normal.  Cardiovascular:     Rate and Rhythm: Normal rate and regular rhythm.     Pulses: Normal pulses.          Radial pulses are 2+ on the right side and 2+ on the left side.       Dorsalis pedis pulses are 2+ on the right side and 2+ on the left side.     Heart sounds: Normal heart sounds. No murmur heard.   Pulmonary:     Effort: Pulmonary effort is normal. No respiratory distress.     Breath sounds: Normal breath sounds. No wheezing or rales.  Chest:     Chest wall: No lacerations, deformity, swelling, tenderness or crepitus.  Abdominal:     General: A surgical scar is present. Bowel sounds are normal. There is no distension.     Palpations: Abdomen is soft.     Tenderness: There is abdominal tenderness in the right lower quadrant. There is right CVA tenderness. There is no left CVA tenderness, guarding or rebound. Negative signs include Murphy's sign.    Musculoskeletal:        General: No deformity.     Cervical back: Neck supple. No tenderness or crepitus. No pain with movement or spinous process tenderness.     Right lower leg: No edema.     Left lower leg: No edema.  Lymphadenopathy:     Cervical: No cervical adenopathy.  Skin:    General: Skin is warm and dry.     Capillary Refill: Capillary refill takes less than 2 seconds.  Neurological:     General: No focal deficit present.     Mental Status: She is alert and oriented to person, place, and time. Mental status is at baseline.  Cranial Nerves: Cranial nerves are intact.     Sensory: Sensation is intact.     Motor: Motor function is intact.     Gait: Gait is intact.  Psychiatric:        Mood and Affect: Mood  normal.     ED Results / Procedures / Treatments   Labs (all labs ordered are listed, but only abnormal results are displayed) Labs Reviewed  BASIC METABOLIC PANEL - Abnormal; Notable for the following components:      Result Value   Potassium 3.4 (*)    Glucose, Bld 153 (*)    Creatinine, Ser 1.05 (*)    GFR, Estimated 57 (*)    All other components within normal limits  URINALYSIS, ROUTINE W REFLEX MICROSCOPIC - Abnormal; Notable for the following components:   APPearance HAZY (*)    Hgb urine dipstick SMALL (*)    Nitrite POSITIVE (*)    Leukocytes,Ua LARGE (*)    WBC, UA >50 (*)    Bacteria, UA MANY (*)    All other components within normal limits  URINE CULTURE  CBC WITH DIFFERENTIAL/PLATELET    EKG None  Radiology CT RENAL STONE STUDY  Result Date: 01/22/2021 CLINICAL DATA:  Right flank pain EXAM: CT ABDOMEN AND PELVIS WITHOUT CONTRAST TECHNIQUE: Multidetector CT imaging of the abdomen and pelvis was performed following the standard protocol without IV contrast. COMPARISON:  11/03/2020 FINDINGS: Lower chest: No acute abnormality. Hepatobiliary: Layering gallstones within the gallbladder. No focal hepatic abnormality. Pancreas: No focal abnormality or ductal dilatation. Spleen: No focal abnormality.  Normal size. Adrenals/Urinary Tract: Bilateral nonobstructing renal stones. Nonobstructing 3 mm mid right ureteral stone at the pelvic brim, unchanged in position since prior study. Mild fullness of the left renal collecting system and ureter, decreased since prior study. No ureteral stones on the left. Bilateral renal cysts are unchanged. Urinary bladder is unremarkable. Stomach/Bowel: Prior gastric sleeve. No evidence of bowel obstruction. Moderate stool throughout the colon. Very large ventral hernia noted containing the distal stomach and numerous small bowel loops as well as the right colon and transverse colon. This is unchanged. Vascular/Lymphatic: Diffuse aortic  atherosclerosis. No evidence of aneurysm or adenopathy. Reproductive: Uterus and right adnexa unremarkable. 3.4 cm cyst in the left ovary, unchanged since prior study. Other: No free fluid or free air. Musculoskeletal: No acute bony abnormality. IMPRESSION: Stable position of the 3 mm mid right ureteral stone at the pelvic brim. Previously seen right hydronephrosis has decreased since prior study. Bilateral nephrolithiasis. Stable bilateral renal cysts. Cholelithiasis. Large ventral hernia containing portions of the stomach, small bowel and colon. Diffuse aortic atherosclerosis. Stable left ovarian cyst. Electronically Signed   By: Rolm Baptise M.D.   On: 01/22/2021 11:09    Procedures Procedures   Medications Ordered in ED Medications  morphine 4 MG/ML injection 4 mg (has no administration in time range)  ketorolac (TORADOL) 30 MG/ML injection 30 mg (30 mg Intravenous Given 01/22/21 0946)  ondansetron (ZOFRAN) injection 4 mg (4 mg Intravenous Given 01/22/21 0946)  morphine 4 MG/ML injection 4 mg (4 mg Intravenous Given 01/22/21 1021)  cefTRIAXone (ROCEPHIN) 1 g in sodium chloride 0.9 % 100 mL IVPB (0 g Intravenous Stopped 01/22/21 1115)    ED Course  I have reviewed the triage vital signs and the nursing notes.  Pertinent labs & imaging results that were available during my care of the patient were reviewed by me and considered in my medical decision making (see chart for details).  Clinical  Course as of 01/22/21 1235  Thu Jan 22, 2021  1201 Consult placed to urologist, Dr. Abner Greenspan, who suggested this patient may need lithotripsy given no movement of ureteral stone since December and pain at the pelvic brim.  However given patient currently has infected urine and stones are nonobstructing, she is not a candidate for stenting at this time.  He recommends close outpatient follow-up with him in the office and return to the ED should for urgent stenting should she develop any fevers in the interim. [RS]     Clinical Course User Index [RS] Lutie Pickler, Sharlene Dory   MDM Rules/Calculators/A&P                         70 year old female with history of recurrent nephrolithiasis who presents with 1 week of right-sided flank pain and decreased urine.  The differential diagnosis of emergent flank pain includes, but is not limited to: Nephrolithiasis/ Renal Colic, Pyelonephritis, Abdominal aortic aneurysm, Aortic dissection, Renal artery embolism, Renal vein thrombosis, Renal infarction, Renal hemorrhage, Mesenteric ischemia, Bladder tumor, Cystitis, Biliary colic, Pancreatitis, Perforated peptic ulcer,  Appendicitis, Inguinal Hernia, Diverticulitis, Bowel obstruction. Shingles, Lower lobe pneumonia, Retroperitoneal hematoma/abscess/tumor, Epidural abscess, Epidural hematoma.  Particularly in females it is important to consider Ectopic Pregnancy,PID/TOA,Ovarian cyst, Ovarian torsion, STD. Patient is postmenopausal.   Hypertensive on intake, vital signs otherwise normal.  Cardiopulmonary exam is normal, abdominal exam revealed right-sided CVA tenderness and right lower abdominal tenderness to palpation without guarding or rebound.  Patient is neurovascularly intact in all 4 extremities.  We'll proceed with basic laboratory studies, urine test, and CT renal study. Analgesia and antiemetic offered.   CBC without leukocytosis, BMP with mild hypokalemia 3.4, creatinine 1.05 at patient baseline.  Urinalysis suggestive of infection at this time.  Will proceed with dose of IV Rocephin while awaiting CT scan.  CT scan revealed bilateral nonobstructing nephrolithiasis as well as right-sided 3 mm ureteral stone at the pelvic brim, unchanged in its position since December.  Consult to urologist, Dr. Abner Greenspan, as above; I appreciate his collaboration in the care of this patient.  Given reassuring vital signs, blood work, and imaging no further work-up is warranted in the ED at this time.  Patient's symptoms are secondary to  nephrolithiasis and ureteral stone.  Will discharge with antibiotic course for UTI outpatient.  She will need to contact her provider with whom she has a pain contract to receive refill of her outpatient pain medication.  Strict return precautions should she develop fevers at home, as she may require more emergent stent placement given infected urine.   Marilyn Myers voiced understanding of her medical evaluation and treatment plan.  Each of her questions was answered to her expressed satisfaction.  Strict return cautions given.  Patient is stable and appropriate for discharge at this time.  This chart was dictated using voice recognition software, Dragon. Despite the best efforts of this provider to proofread and correct errors, errors may still occur which can change documentation meaning.  Final Clinical Impression(s) / ED Diagnoses Final diagnoses:  Lower urinary tract infectious disease  Kidney stone  Ureterolithiasis    Rx / DC Orders ED Discharge Orders         Ordered    cephALEXin (KEFLEX) 500 MG capsule  4 times daily        01/22/21 [address] Gypsy Balsam, PA-C 01/22/21 1235    Lajean Saver, MD  01/23/21 1310  

## 2021-01-22 NOTE — ED Notes (Signed)
RN entered room to medicate pt and go over discharge instructions. Pt insisted on speaking with MD. Attempted several times to reassure pt I would ask the MD to come speak w her. Pt called this RN a "smartass" and was very rude towards nursing staff. Provider made aware.

## 2021-01-24 LAB — URINE CULTURE: Culture: >=100000 — AB

## 2021-01-25 ENCOUNTER — Encounter (HOSPITAL_COMMUNITY): Payer: Self-pay

## 2021-01-25 ENCOUNTER — Emergency Department (HOSPITAL_COMMUNITY)
Admission: EM | Admit: 2021-01-25 | Discharge: 2021-01-25 | Disposition: A | Discharge status: Home / Self Care | Payer: Medicare Other | Attending: Emergency Medicine | Admitting: Emergency Medicine

## 2021-01-25 ENCOUNTER — Other Ambulatory Visit: Payer: Self-pay

## 2021-01-25 ENCOUNTER — Telehealth: Payer: Self-pay | Admitting: Emergency Medicine

## 2021-01-25 DIAGNOSIS — M5431 Sciatica, right side: Secondary | ICD-10-CM | POA: Diagnosis not present

## 2021-01-25 DIAGNOSIS — R1030 Lower abdominal pain, unspecified: Secondary | ICD-10-CM | POA: Insufficient documentation

## 2021-01-25 DIAGNOSIS — I10 Essential (primary) hypertension: Secondary | ICD-10-CM | POA: Insufficient documentation

## 2021-01-25 DIAGNOSIS — E1142 Type 2 diabetes mellitus with diabetic polyneuropathy: Secondary | ICD-10-CM | POA: Diagnosis not present

## 2021-01-25 DIAGNOSIS — Z79899 Other long term (current) drug therapy: Secondary | ICD-10-CM | POA: Insufficient documentation

## 2021-01-25 DIAGNOSIS — M5441 Lumbago with sciatica, right side: Secondary | ICD-10-CM | POA: Diagnosis not present

## 2021-01-25 DIAGNOSIS — R110 Nausea: Secondary | ICD-10-CM | POA: Diagnosis not present

## 2021-01-25 DIAGNOSIS — M545 Low back pain, unspecified: Secondary | ICD-10-CM | POA: Diagnosis present

## 2021-01-25 DIAGNOSIS — Z7984 Long term (current) use of oral hypoglycemic drugs: Secondary | ICD-10-CM | POA: Diagnosis not present

## 2021-01-25 DIAGNOSIS — Z87891 Personal history of nicotine dependence: Secondary | ICD-10-CM | POA: Insufficient documentation

## 2021-01-25 DIAGNOSIS — M25551 Pain in right hip: Secondary | ICD-10-CM | POA: Diagnosis not present

## 2021-01-25 LAB — BASIC METABOLIC PANEL
Anion gap: 14 (ref 5–15)
BUN: 16 mg/dL (ref 8–23)
CO2: 24 mmol/L (ref 22–32)
Calcium: 10 mg/dL (ref 8.9–10.3)
Chloride: 96 mmol/L — ABNORMAL LOW (ref 98–111)
Creatinine, Ser: 1.08 mg/dL — ABNORMAL HIGH (ref 0.44–1.00)
GFR, Estimated: 55 mL/min — ABNORMAL LOW (ref >60)
Glucose, Bld: 137 mg/dL — ABNORMAL HIGH (ref 70–99)
Potassium: 3.8 mmol/L (ref 3.5–5.1)
Sodium: 134 mmol/L — ABNORMAL LOW (ref 135–145)

## 2021-01-25 LAB — CBC WITH DIFFERENTIAL/PLATELET
Abs Immature Granulocytes: 0.05 10*3/uL (ref 0.00–0.07)
Basophils Absolute: 0 10*3/uL (ref 0.0–0.1)
Basophils Relative: 0 %
Eosinophils Absolute: 0.1 10*3/uL (ref 0.0–0.5)
Eosinophils Relative: 1 %
HCT: 41.4 % (ref 36.0–46.0)
Hemoglobin: 13.5 g/dL (ref 12.0–15.0)
Immature Granulocytes: 1 %
Lymphocytes Relative: 19 %
Lymphs Abs: 2 10*3/uL (ref 0.7–4.0)
MCH: 29.3 pg (ref 26.0–34.0)
MCHC: 32.6 g/dL (ref 30.0–36.0)
MCV: 89.8 fL (ref 80.0–100.0)
Monocytes Absolute: 1 10*3/uL (ref 0.1–1.0)
Monocytes Relative: 9 %
Neutro Abs: 7.6 10*3/uL (ref 1.7–7.7)
Neutrophils Relative %: 70 %
Platelets: 337 10*3/uL (ref 150–400)
RBC: 4.61 MIL/uL (ref 3.87–5.11)
RDW: 13.2 % (ref 11.5–15.5)
WBC: 10.7 10*3/uL — ABNORMAL HIGH (ref 4.0–10.5)
nRBC: 0 % (ref 0.0–0.2)

## 2021-01-25 MED ORDER — FENTANYL CITRATE (PF) 100 MCG/2ML IJ SOLN
100.0000 ug | Freq: Once | INTRAMUSCULAR | Status: AC
Start: 2021-01-25 — End: 2021-01-25
  Administered 2021-01-25: 100 ug via INTRAVENOUS
  Filled 2021-01-25: qty 2

## 2021-01-25 MED ORDER — FENTANYL CITRATE (PF) 100 MCG/2ML IJ SOLN
100.0000 ug | Freq: Once | INTRAMUSCULAR | Status: AC
  Administered 2021-01-25: 100 ug via INTRAVENOUS
  Filled 2021-01-25: qty 2

## 2021-01-25 MED ORDER — PREDNISONE 20 MG PO TABS
60.0000 mg | ORAL_TABLET | Freq: Once | ORAL | Status: AC
  Administered 2021-01-25: 60 mg via ORAL
  Filled 2021-01-25: qty 3

## 2021-01-25 MED ORDER — DIAZEPAM 5 MG PO TABS: 5.0000 mg | ORAL_TABLET | Freq: Three times a day (TID) | ORAL | 0 refills | Status: DC | PRN

## 2021-01-25 MED ORDER — SODIUM CHLORIDE 0.9 % IV BOLUS
500.0000 mL | Freq: Once | INTRAVENOUS | Status: AC
  Administered 2021-01-25: 500 mL via INTRAVENOUS

## 2021-01-25 MED ORDER — LORAZEPAM 1 MG PO TABS
1.0000 mg | ORAL_TABLET | Freq: Once | ORAL | Status: AC
  Administered 2021-01-25: 1 mg via ORAL
  Filled 2021-01-25: qty 1

## 2021-01-25 MED ORDER — PREDNISONE 20 MG PO TABS: 20.0000 mg | ORAL_TABLET | Freq: Two times a day (BID) | ORAL | 0 refills | Status: DC

## 2021-01-25 MED ORDER — ONDANSETRON HCL 4 MG/2ML IJ SOLN
4.0000 mg | Freq: Once | INTRAMUSCULAR | Status: AC
  Administered 2021-01-25: 4 mg via INTRAVENOUS
  Filled 2021-01-25: qty 2

## 2021-01-25 NOTE — Discharge Instructions (Addendum)
Use heat on the sore area 3-4 times a day.  Start the new prescription medication, tomorrow.  See the urologist this week as scheduled.

## 2021-01-25 NOTE — ED Provider Notes (Signed)
[place] DEPT Provider Note   CSN: [id number] Arrival date & time: 01/25/21  1648     History Chief Complaint  Patient presents with  . Hip Pain  . Nausea    Marilyn Myers is a 70 y.o. female.  HPI She presents for evaluation of right lumbar pain radiating to the right buttock, several days ago and persistent despite taking her daily high-dose narcotic analgesia.  She is currently being treated for UTI and has a chronic ureter stone, but did not show complication, when evaluated, 3 days ago.  She is due to follow-up with urology in 2 days time.  She denies fever, vomiting or dizziness.  Taking her usual medicines, without relief.  There are no other known active modifying factors.    Past Medical History:  Diagnosis Date  . Anxiety   . Diverticulitis of intestine without perforation or abscess   . Diverticulosis, sigmoid   . GERD (gastroesophageal reflux disease)   . History of diverticulitis of colon    09/ 2015  RESOLVED   . History of kidney stones   . Hypertension   . Nephrolithiasis    BILATERAL  . OSA on CPAP    wears cpap  . Right ureteral stone   . Seasonal allergic rhinitis 01/24/2018  . Type 2 diabetes mellitus [facility])     Patient Active Problem List   Diagnosis Date Noted  . Gastroesophageal reflux disease without esophagitis 01/07/2020  . Chronic narcotic use 01/07/2020  . Obesity (BMI 30-39.9) 02/22/2019  . Refusal of statin medication by patient 01/24/2018  . Seasonal allergic rhinitis 01/24/2018  . PVC's (premature ventricular contractions) 10/08/2016  . Diabetic peripheral neuropathy ([place]) 09/28/2016  . S/P colostomy takedown 07/02/2015  . History of osteopenia 05/19/2015  . Essential hypertension 01/28/2015  . Diverticulitis of large intestine 08/19/2014  . Hydradenitis 08/19/2014  . Nephrolithiasis 08/19/2014  . Obstructive sleep apnea 08/01/2014  . Controlled type 2 diabetes mellitus with diabetic polyneuropathy,  without long-term current use of insulin ([place]) 06/10/2014    Past Surgical History:  Procedure Laterality Date  . BARIATRIC SURGERY    . CERVICAL CONE BIOPSY  1976  . COLON RESECTION     colostomy x 6 months  . CYSTO/  BILATERAL RETROGRADE PYELOGRAM/  PLACEMENT BILATERAL URETERAL STENTS  07-10-2011  . CYSTOSCOPY WITH RETROGRADE PYELOGRAM, URETEROSCOPY AND STENT PLACEMENT Right 10/11/2014   Procedure: CYSTOSCOPY WITH RETROGRADE PYELOGRAM, URETEROSCOPY, right ureteral biopsy, AND STENT PLACEMENT;  Surgeon: Arvil Persons, MD;  Location[facility];  Service: Urology;  Laterality: Right;  . CYSTOSCOPY/RETROGRADE/URETEROSCOPY  10/08/2011   Procedure: CYSTOSCOPY/RETROGRADE/URETEROSCOPY;  Surgeon: Hanley Ben, MD;  Location: [facility];  Service: Urology;  Laterality: N/A;  CYSTOSCOPY, RIGHT  RETROGRADE, RIGHT URETEROSCOPY HOLMIUM LASER WITH BASKET STONE EXTRACTION AND RIGHT URETERAL STENT PLACEMENT  . EXTRACORPOREAL SHOCK WAVE LITHOTRIPSY  x3  last one 01-24-2012  . TUBAL LIGATION  1987  . URETEROSCPIC STONE EXTRACTION/ STENT PLACEMENT  left 08-20-2011     OB History    Gravida  3   Para  3   Term      Preterm      AB      Living  3     SAB      IAB      Ectopic      Multiple      Live Births              Family History  Problem Relation Age of Onset  . Breast cancer Mother        65  . Renal Disease Mother   . Heart disease Mother   . Diabetes Paternal Grandmother   . Diabetes Maternal Aunt     Social History   Tobacco Use  . Smoking status: Former Smoker    Packs/day: 1.00    Years: 25.00    Pack years: 25.00    Types: Cigarettes    Quit date: 10/07/1996    Years since quitting: 24.3  . Smokeless tobacco: Never Used  Vaping Use  . Vaping Use: Never used  Substance Use Topics  . Alcohol use: Not Currently  . Drug use: No    Home Medications Prior to Admission medications   Medication Sig Start Date End Date  Taking? Authorizing Provider  Ascorbic Acid (VITAMIN C PO) Take 1 tablet by mouth at bedtime.   Yes [provider]  BIOTIN PO Take 1 tablet by mouth at bedtime.   Yes [provider]  Calcium Carbonate-Vitamin D (CALCIUM-VITAMIN D3 PO) Take 1 tablet by mouth at bedtime.   Yes [provider]  cephALEXin (KEFLEX) 500 MG capsule Take 1 capsule (500 mg total) by mouth 4 (four) times daily for 7 days. 01/22/21 01/29/21 Yes Sponseller, Gypsy Balsam, PA-C  Cyanocobalamin (VITAMIN B 12 PO) Take 1 tablet by mouth at bedtime.   Yes [provider]  diazepam (VALIUM) 5 MG tablet Take 1 tablet (5 mg total) by mouth every 8 (eight) hours as needed for muscle spasms. 01/25/21  Yes Daleen Bo, MD  diltiazem (CARDIZEM CD) 120 MG 24 hr capsule Take 1 capsule (120 mg total) by mouth daily. Patient taking differently: Take 120 mg by mouth every evening. 01/16/21  Yes [name], MD  FIBER ADULT GUMMIES PO Take 1 tablet by mouth at bedtime.   Yes [provider]  gabapentin (NEURONTIN) 600 MG tablet Take 1 tablet (600 mg total) by mouth 2 (two) times daily. Patient taking differently: Take 600 mg by mouth See admin instructions. Take one tablet (600 mg) by mouth twice daily - 5am and 1pm 01/16/21  Yes [name], MD  hydrochlorothiazide (HYDRODIURIL) 25 MG tablet Take 1 tablet (25 mg total) by mouth daily. Patient taking differently: Take 25 mg by mouth every morning. 01/16/21  Yes [name], MD  HYDROcodone-acetaminophen [facility]) 10-325 MG tablet Take 1 tablet by mouth 4 (four) times daily. 07/31/19  Yes [provider]  losartan (COZAAR) 100 MG tablet Take 1 tablet (100 mg total) by mouth daily. Patient taking differently: Take 100 mg by mouth every morning. 01/16/21  Yes [name], MD  metFORMIN (GLUCOPHAGE) 1000 MG tablet Take 1 tablet (1,000 mg total) by mouth 2 (two) times daily. Patient taking differently: Take 1,000 mg by mouth 2 (two) times  daily with a meal. 01/16/21  Yes [name], MD  Multiple Vitamins-Minerals (ADULT ONE DAILY GUMMIES) CHEW Chew 1 tablet by mouth at bedtime.   Yes [provider]  pantoprazole (PROTONIX) 20 MG tablet TAKE 1 TO 2 TABLETS (20-40 MG TOTAL) BY MOUTH DAILY. Patient taking differently: Take 20 mg by mouth every evening. 10/29/20  Yes [name], MD  predniSONE (DELTASONE) 20 MG tablet Take 1 tablet (20 mg total) by mouth 2 (two) times daily. 01/25/21  Yes Daleen Bo, MD    Allergies    Ace inhibitors, Codeine, and Flomax [tamsulosin hcl]  Review of Systems  Review of Systems  All other systems reviewed and are negative.   Physical Exam Updated Vital Signs BP (!) 168/95   Pulse 74   Temp 98.1 F (36.7 C) (Oral)   Resp 16   Ht 5\' 6"  (1.676 m)   Wt 93 kg   SpO2 98%   BMI 33.09 kg/m   Physical Exam Vitals and nursing note reviewed.  Constitutional:      General: She is not in acute distress.    Appearance: She is well-developed and well-nourished. She is not ill-appearing, toxic-appearing or diaphoretic.  HENT:     Head: Normocephalic and atraumatic.     Right Ear: External ear normal.     Left Ear: External ear normal.  Eyes:     Extraocular Movements: EOM normal.     Conjunctiva/sclera: Conjunctivae normal.     Pupils: Pupils are equal, round, and reactive to light.  Neck:     Trachea: Phonation normal.  Cardiovascular:     Rate and Rhythm: Normal rate and regular rhythm.     Heart sounds: Normal heart sounds.  Pulmonary:     Effort: Pulmonary effort is normal.     Breath sounds: Normal breath sounds.  Chest:     Chest wall: No bony tenderness.  Abdominal:     General: There is no distension.     Palpations: Abdomen is soft.     Tenderness: There is abdominal tenderness (Lower mid abdomen, moderate). There is no guarding.  Musculoskeletal:     Cervical back: Normal range of motion and neck supple.     Comments: Tender right lower lumbar and right  buttocks region.  Skin:    General: Skin is warm, dry and intact.  Neurological:     Mental Status: She is alert and oriented to person, place, and time.     Cranial Nerves: No cranial nerve deficit.     Sensory: No sensory deficit.     Motor: No abnormal muscle tone.     Coordination: Coordination normal.  Psychiatric:        Mood and Affect: Mood and affect and mood normal.        Behavior: Behavior normal.        Thought Content: Thought content normal.        Judgment: Judgment normal.     ED Results / Procedures / Treatments   Labs (all labs ordered are listed, but only abnormal results are displayed) Labs Reviewed  BASIC METABOLIC PANEL - Abnormal; Notable for the following components:      Result Value   Sodium 134 (*)    Chloride 96 (*)    Glucose, Bld 137 (*)    Creatinine, Ser 1.08 (*)    GFR, Estimated 55 (*)    All other components within normal limits  CBC WITH DIFFERENTIAL/PLATELET - Abnormal; Notable for the following components:   WBC 10.7 (*)    All other components within normal limits    EKG None  Radiology No results found.  Procedures Procedures   Medications Ordered in ED Medications  predniSONE (DELTASONE) tablet 60 mg (has no administration in time range)  LORazepam (ATIVAN) tablet 1 mg (has no administration in time range)  ondansetron (ZOFRAN) injection 4 mg (4 mg Intravenous Given 01/25/21 1836)  fentaNYL (SUBLIMAZE) injection 100 mcg (100 mcg Intravenous Given 01/25/21 1836)  sodium chloride 0.9 % bolus 500 mL (0 mLs Intravenous Stopped 01/25/21 1950)  fentaNYL (SUBLIMAZE) injection 100 mcg (100 mcg Intravenous Given 01/25/21  1949)    ED Course  I have reviewed the triage vital signs and the nursing notes.  Pertinent labs & imaging results that were available during my care of the patient were reviewed by me and considered in my medical decision making (see chart for details).    MDM Rules/Calculators/A&P                            Patient Vitals for the past 24 hrs:  BP Temp Temp src Pulse Resp SpO2 Height Weight  01/25/21 1945 (!) 168/95 -- -- 74 16 98 % -- --  01/25/21 1930 (!) 154/99 -- -- 69 14 98 % -- --  01/25/21 1915 119/78 -- -- 79 20 96 % -- --  01/25/21 1900 (!) 146/40 -- -- 77 20 97 % -- --  01/25/21 1847 (!) 151/80 -- -- 81 17 93 % -- --  01/25/21 1657 -- -- -- -- -- -- 5\' 6"  (1.676 m) 93 kg  01/25/21 1655 (!) 192/104 98.1 F (36.7 C) Oral 86 20 100 % -- --    8:18 PM Reevaluation with update and discussion. After initial assessment and treatment, an updated evaluation reveals sitting up comfortably eating soup.  She states she feels better.  Findings discussed with the patient.  We will treat for sciatica and send medications to her pharmacy.  First doses will be given here.  Findings discussed and questions answered. Daleen Bo   Medical Decision Making:  This patient is presenting for evaluation of lower back pain rating to right buttock, and ongoing lower abdominal pain, which does require a range of treatment options, and is a complaint that involves a moderate risk of morbidity and mortality. The differential diagnoses include muscle strain, sciatica, complications from UTI. I decided to review old records, and in summary elderly female being managed as outpatient for UTI with retained right ureter stone for several months..  I did not require additional historical information from anyone.  Clinical Laboratory Tests Ordered, included CBC and Metabolic panel. Review indicates sodium and chloride, slightly low.  Glucose mildly elevated.  Creatinine slightly high.  White count minimally elevated.   Critical Interventions-clinical evaluation, laboratory testing, medication treatment, observation and reassessment  After These Interventions, the Patient was reevaluated and was found stable for discharge.  Suspect pain related to sciatica, not UTI or renal stones.  Patient nontoxic, stable for  discharge.  Doubt lumbar myelopathy  CRITICAL CARE-no Performed by: Daleen Bo  Nursing Notes Reviewed/ Care Coordinated Applicable Imaging Reviewed Interpretation of Laboratory Data incorporated into ED treatment  The patient appears reasonably screened and/or stabilized for discharge and I doubt any other medical condition or other [facility] requiring further screening, evaluation, or treatment in the ED at this time prior to discharge.  Plan: Home Medications-continue usual; Home Treatments-heat to affected area; return here if the recommended treatment, does not improve the symptoms; Recommended follow up-PCP, as needed.  Urology this week as scheduled.     Final Clinical Impression(s) / ED Diagnoses Final diagnoses:  Sciatica of right side    Rx / DC Orders ED Discharge Orders         Ordered    diazepam (VALIUM) 5 MG tablet  Every 8 hours PRN        01/25/21 2026    predniSONE (DELTASONE) 20 MG tablet  2 times daily        01/25/21 2026  Daleen Bo, MD 01/25/21 2028

## 2021-01-25 NOTE — ED Triage Notes (Signed)
Patient c/o right hip pain. Patient denies any radiation of the pain. Patient was seen on 3/3 /22, but states the pain is lowerr down than the pain she had on 01/22/21.  Patient also c/o nausea.

## 2021-01-25 NOTE — Telephone Encounter (Signed)
Post ED Visit - Positive Culture Follow-up  Culture report reviewed by antimicrobial stewardship pharmacist: [place] Team []  Elenor Quinones, Pharm.D. []  Heide Guile, Pharm.D., BCPS AQ-ID []  Parks Neptune, Pharm.D., BCPS []  Alycia Rossetti, Pharm.D., BCPS []  [place], Pharm.D., BCPS, AAHIVP []  Legrand Como, Pharm.D., BCPS, AAHIVP []  Salome Arnt, PharmD, BCPS []  Johnnette Gourd, PharmD, BCPS []  Hughes Better, PharmD, BCPS []  Leeroy Cha, PharmD []  Laqueta Linden, PharmD, BCPS []  Albertina Parr, PharmD  [place] Team []  Leodis Sias, PharmD []  Lindell Spar, PharmD []  Royetta Asal, PharmD []  Graylin Shiver, Rph []  Rema Fendt) Glennon Mac, PharmD []  Arlyn Dunning, PharmD []  Netta Cedars, PharmD []  Dia Sitter, PharmD []  Leone Haven, PharmD []  Gretta Arab, PharmD []  Theodis Shove, PharmD []  Peggyann Juba, PharmD [x]  Reuel Boom, PharmD   Positive urine culture Treated with Cephalexin, organism sensitive to the same and no further patient follow-up is required at this time.  Sandi Raveling Nonnie Pickney 01/25/2021, 4:15 PM

## 2021-01-26 DIAGNOSIS — N281 Cyst of kidney, acquired: Secondary | ICD-10-CM | POA: Diagnosis not present

## 2021-01-26 DIAGNOSIS — N201 Calculus of ureter: Secondary | ICD-10-CM | POA: Diagnosis not present

## 2021-01-26 DIAGNOSIS — N202 Calculus of kidney with calculus of ureter: Secondary | ICD-10-CM | POA: Diagnosis not present

## 2021-01-28 ENCOUNTER — Other Ambulatory Visit: Payer: Self-pay | Admitting: Urology

## 2021-01-29 DIAGNOSIS — G629 Polyneuropathy, unspecified: Secondary | ICD-10-CM | POA: Diagnosis not present

## 2021-01-29 DIAGNOSIS — R200 Anesthesia of skin: Secondary | ICD-10-CM | POA: Diagnosis not present

## 2021-01-29 DIAGNOSIS — M7060 Trochanteric bursitis, unspecified hip: Secondary | ICD-10-CM | POA: Diagnosis not present

## 2021-01-29 DIAGNOSIS — G894 Chronic pain syndrome: Secondary | ICD-10-CM | POA: Diagnosis not present

## 2021-02-10 DIAGNOSIS — M7060 Trochanteric bursitis, unspecified hip: Secondary | ICD-10-CM | POA: Diagnosis not present

## 2021-02-12 NOTE — Progress Notes (Signed)
DUE TO COVID-19 ONLY ONE VISITOR IS ALLOWED TO COME WITH YOU AND STAY IN THE WAITING ROOM ONLY DURING PRE OP AND PROCEDURE DAY OF SURGERY. THE 1 VISITOR  MAY VISIT WITH YOU AFTER SURGERY IN YOUR PRIVATE ROOM DURING VISITING HOURS ONLY!  YOU NEED TO HAVE A COVID 19 TEST ON__3/29/2022_____ @_______ , THIS TEST MUST BE DONE BEFORE SURGERY,  COVID TESTING SITE [address] [place] [address], IT IS ON THE RIGHT GOING OUT [address] APPROXIMATELY  2 MINUTES PAST [employer] ON THE RIGHT. ONCE YOUR COVID TEST IS COMPLETED,  PLEASE BEGIN THE QUARANTINE INSTRUCTIONS AS OUTLINED IN YOUR HANDOUT.                Marilyn Myers  02/12/2021   Your procedure is scheduled on:  02/20/2021   Report to [facility] Main  Entrance   Report to admitting at     0930 AM     Call this number if you have problems the morning of surgery [phone]    Remember: Do not eat food , candy gum or mints :After Midnight. You may have clear liquids from midnight until 0830 am     CLEAR LIQUID DIET   Foods Allowed                                                                       Coffee and tea, regular and decaf                              Plain Jell-O any favor except red or purple                                            Fruit ices (not with fruit pulp)                                      Iced Popsicles                                     Carbonated beverages, regular and diet                                    Cranberry, grape and apple juices Sports drinks like Gatorade Lightly seasoned clear broth or consume(fat free) Sugar, honey syrup   _____________________________________________________________________    BRUSH YOUR TEETH MORNING OF SURGERY AND RINSE YOUR MOUTH OUT, NO CHEWING GUM CANDY OR MINTS.     Take these medicines the morning of surgery with A SIP OF WATER: gabapentin, prednisone  DO NOT TAKE ANY DIABETIC MEDICATIONS DAY OF YOUR SURGERY                                You may not have any metal on  your body including hair pins and              piercings  Do not wear jewelry, make-up, lotions, powders or perfumes, deodorant             Do not wear nail polish on your fingernails.  Do not shave  48 hours prior to surgery.              Men may shave face and neck.   Do not bring valuables to the hospital. [place].  Contacts, dentures or bridgework may not be worn into surgery.  Leave suitcase in the car. After surgery it may be brought to your room.     Patients discharged the day of surgery will not be allowed to drive home. IF YOU ARE HAVING SURGERY AND GOING HOME THE SAME DAY, YOU MUST HAVE AN ADULT TO DRIVE YOU HOME AND BE WITH YOU FOR 24 HOURS. YOU MAY GO HOME BY TAXI OR UBER OR ORTHERWISE, BUT AN ADULT MUST ACCOMPANY YOU HOME AND STAY WITH YOU FOR 24 HOURS.  Name and phone number of your driver:  Special Instructions: N/A              Please read over the following fact sheets you were given: _____________________________________________________________________  [facility] - Preparing for Surgery Before surgery, you can play an important role.  Because skin is not sterile, your skin needs to be as free of germs as possible.  You can reduce the number of germs on your skin by washing with CHG (chlorahexidine gluconate) soap before surgery.  CHG is an antiseptic cleaner which kills germs and bonds with the skin to continue killing germs even after washing. Please DO NOT use if you have an allergy to CHG or antibacterial soaps.  If your skin becomes reddened/irritated stop using the CHG and inform your nurse when you arrive at Short Stay. Do not shave (including legs and underarms) for at least 48 hours prior to the first CHG shower.  You may shave your face/neck. Please follow these instructions carefully:  1.  Shower with CHG Soap the night before surgery and the  morning of  Surgery.  2.  If you choose to wash your hair, wash your hair first as usual with your  normal  shampoo.  3.  After you shampoo, rinse your hair and body thoroughly to remove the  shampoo.                           4.  Use CHG as you would any other liquid soap.  You can apply chg directly  to the skin and wash                       Gently with a scrungie or clean washcloth.  5.  Apply the CHG Soap to your body ONLY FROM THE NECK DOWN.   Do not use on face/ open                           Wound or open sores. Avoid contact with eyes, ears mouth and genitals (private parts).                       Wash face,  Genitals (  private parts) with your normal soap.             6.  Wash thoroughly, paying special attention to the area where your surgery  will be performed.  7.  Thoroughly rinse your body with warm water from the neck down.  8.  DO NOT shower/wash with your normal soap after using and rinsing off  the CHG Soap.                9.  Pat yourself dry with a clean towel.            10.  Wear clean pajamas.            11.  Place clean sheets on your bed the night of your first shower and do not  sleep with pets. Day of Surgery : Do not apply any lotions/deodorants the morning of surgery.  Please wear clean clothes to the hospital/surgery center.  FAILURE TO FOLLOW THESE INSTRUCTIONS MAY RESULT IN THE CANCELLATION OF YOUR SURGERY PATIENT SIGNATURE_________________________________  NURSE SIGNATURE__________________________________  ________________________________________________________________________

## 2021-02-17 ENCOUNTER — Encounter (HOSPITAL_COMMUNITY)
Admission: RE | Admit: 2021-02-17 | Discharge: 2021-02-17 | Disposition: A | Discharge status: Home / Self Care | Payer: Medicare Other | Source: Ambulatory Visit | Attending: Urology | Admitting: Urology

## 2021-02-17 ENCOUNTER — Encounter (HOSPITAL_COMMUNITY): Payer: Self-pay

## 2021-02-17 ENCOUNTER — Other Ambulatory Visit: Payer: Self-pay

## 2021-02-17 ENCOUNTER — Other Ambulatory Visit (HOSPITAL_COMMUNITY)
Admission: RE | Admit: 2021-02-17 | Discharge: 2021-02-17 | Disposition: A | Discharge status: Home / Self Care | Payer: Medicare Other | Source: Ambulatory Visit | Attending: Urology | Admitting: Urology

## 2021-02-17 DIAGNOSIS — I491 Atrial premature depolarization: Secondary | ICD-10-CM | POA: Insufficient documentation

## 2021-02-17 DIAGNOSIS — Z01818 Encounter for other preprocedural examination: Secondary | ICD-10-CM | POA: Insufficient documentation

## 2021-02-17 DIAGNOSIS — Z20822 Contact with and (suspected) exposure to covid-19: Secondary | ICD-10-CM | POA: Diagnosis not present

## 2021-02-17 LAB — BASIC METABOLIC PANEL
Anion gap: 9 (ref 5–15)
BUN: 26 mg/dL — ABNORMAL HIGH (ref 8–23)
CO2: 27 mmol/L (ref 22–32)
Calcium: 9.3 mg/dL (ref 8.9–10.3)
Chloride: 103 mmol/L (ref 98–111)
Creatinine, Ser: 1.13 mg/dL — ABNORMAL HIGH (ref 0.44–1.00)
GFR, Estimated: 52 mL/min — ABNORMAL LOW (ref >60)
Glucose, Bld: 152 mg/dL — ABNORMAL HIGH (ref 70–99)
Potassium: 3.9 mmol/L (ref 3.5–5.1)
Sodium: 139 mmol/L (ref 135–145)

## 2021-02-17 LAB — CBC
HCT: 37.1 % (ref 36.0–46.0)
Hemoglobin: 11.9 g/dL — ABNORMAL LOW (ref 12.0–15.0)
MCH: 30.1 pg (ref 26.0–34.0)
MCHC: 32.1 g/dL (ref 30.0–36.0)
MCV: 93.7 fL (ref 80.0–100.0)
Platelets: 232 10*3/uL (ref 150–400)
RBC: 3.96 MIL/uL (ref 3.87–5.11)
RDW: 14 % (ref 11.5–15.5)
WBC: 7.2 10*3/uL (ref 4.0–10.5)
nRBC: 0 % (ref 0.0–0.2)

## 2021-02-17 LAB — GLUCOSE, CAPILLARY: Glucose-Capillary: 154 mg/dL — ABNORMAL HIGH (ref 70–99)

## 2021-02-17 LAB — SARS CORONAVIRUS 2 (TAT 6-24 HRS): SARS Coronavirus 2: NEGATIVE

## 2021-02-17 NOTE — Progress Notes (Signed)
Anesthesia Review:  PCP: DR Billey Chang  Cardiologist : Chest x-ray : EKG : 02/17/21 Echo : Stress test: Cardiac Cath :  Activity level: can do a flight of stairs without difficulty  Sleep Study/ CPAP :no      / Checks Blood Sugar -- times a day:   Blood Thinner/ Instructions /Last Dose: ASA / Instructions/ Last Dose :  DM- type 2  hgba1c- 01/16/21-7.2

## 2021-02-20 ENCOUNTER — Encounter (HOSPITAL_COMMUNITY): Payer: Self-pay | Admitting: Urology

## 2021-02-20 ENCOUNTER — Ambulatory Visit (HOSPITAL_COMMUNITY): Payer: Medicare Other | Admitting: Certified Registered Nurse Anesthetist

## 2021-02-20 ENCOUNTER — Encounter (HOSPITAL_COMMUNITY)
Admission: RE | Disposition: A | Discharge status: Home / Self Care | Payer: Self-pay | Source: Home / Self Care | Attending: Urology

## 2021-02-20 ENCOUNTER — Ambulatory Visit (HOSPITAL_COMMUNITY): Payer: Medicare Other

## 2021-02-20 ENCOUNTER — Ambulatory Visit (HOSPITAL_COMMUNITY)
Admission: RE | Admit: 2021-02-20 | Discharge: 2021-02-20 | Disposition: A | Discharge status: Home / Self Care | Payer: Medicare Other | Attending: Urology | Admitting: Urology

## 2021-02-20 DIAGNOSIS — N132 Hydronephrosis with renal and ureteral calculous obstruction: Secondary | ICD-10-CM | POA: Diagnosis not present

## 2021-02-20 DIAGNOSIS — Z87891 Personal history of nicotine dependence: Secondary | ICD-10-CM | POA: Diagnosis not present

## 2021-02-20 DIAGNOSIS — N200 Calculus of kidney: Secondary | ICD-10-CM

## 2021-02-20 DIAGNOSIS — N281 Cyst of kidney, acquired: Secondary | ICD-10-CM | POA: Diagnosis not present

## 2021-02-20 DIAGNOSIS — Z885 Allergy status to narcotic agent status: Secondary | ICD-10-CM | POA: Insufficient documentation

## 2021-02-20 DIAGNOSIS — Z888 Allergy status to other drugs, medicaments and biological substances status: Secondary | ICD-10-CM | POA: Diagnosis not present

## 2021-02-20 DIAGNOSIS — Z79899 Other long term (current) drug therapy: Secondary | ICD-10-CM | POA: Insufficient documentation

## 2021-02-20 DIAGNOSIS — N202 Calculus of kidney with calculus of ureter: Secondary | ICD-10-CM | POA: Diagnosis not present

## 2021-02-20 DIAGNOSIS — I10 Essential (primary) hypertension: Secondary | ICD-10-CM | POA: Diagnosis not present

## 2021-02-20 DIAGNOSIS — Z7984 Long term (current) use of oral hypoglycemic drugs: Secondary | ICD-10-CM | POA: Diagnosis not present

## 2021-02-20 DIAGNOSIS — G4733 Obstructive sleep apnea (adult) (pediatric): Secondary | ICD-10-CM | POA: Diagnosis not present

## 2021-02-20 DIAGNOSIS — K219 Gastro-esophageal reflux disease without esophagitis: Secondary | ICD-10-CM | POA: Diagnosis not present

## 2021-02-20 HISTORY — PX: CYSTOSCOPY/URETEROSCOPY/HOLMIUM LASER/STENT PLACEMENT: SHX6546

## 2021-02-20 LAB — GLUCOSE, CAPILLARY
Glucose-Capillary: 135 mg/dL — ABNORMAL HIGH (ref 70–99)
Glucose-Capillary: 136 mg/dL — ABNORMAL HIGH (ref 70–99)

## 2021-02-20 SURGERY — CYSTOSCOPY/URETEROSCOPY/HOLMIUM LASER/STENT PLACEMENT
Anesthesia: General | Laterality: Right

## 2021-02-20 MED ORDER — FENTANYL CITRATE (PF) 100 MCG/2ML IJ SOLN: 25.0000 ug | INTRAMUSCULAR | Status: DC | PRN

## 2021-02-20 MED ORDER — PROPOFOL 10 MG/ML IV BOLUS
INTRAVENOUS | Status: DC | PRN
  Administered 2021-02-20: 200 mg via INTRAVENOUS

## 2021-02-20 MED ORDER — FENTANYL CITRATE (PF) 100 MCG/2ML IJ SOLN
INTRAMUSCULAR | Status: AC
  Filled 2021-02-20: qty 2

## 2021-02-20 MED ORDER — OXYCODONE-ACETAMINOPHEN 5-325 MG PO TABS: 1.0000 | ORAL_TABLET | ORAL | 0 refills | Status: AC | PRN

## 2021-02-20 MED ORDER — CEFAZOLIN SODIUM-DEXTROSE 2-4 GM/100ML-% IV SOLN
2.0000 g | Freq: Once | INTRAVENOUS | Status: AC
  Administered 2021-02-20: 2 g via INTRAVENOUS

## 2021-02-20 MED ORDER — CHLORHEXIDINE GLUCONATE 0.12 % MT SOLN
15.0000 mL | Freq: Once | OROMUCOSAL | Status: AC
  Administered 2021-02-20: 15 mL via OROMUCOSAL

## 2021-02-20 MED ORDER — DEXAMETHASONE SODIUM PHOSPHATE 10 MG/ML IJ SOLN
INTRAMUSCULAR | Status: AC
  Filled 2021-02-20: qty 1

## 2021-02-20 MED ORDER — CEFAZOLIN SODIUM-DEXTROSE 2-4 GM/100ML-% IV SOLN
INTRAVENOUS | Status: AC
  Filled 2021-02-20: qty 100

## 2021-02-20 MED ORDER — LIDOCAINE 2% (20 MG/ML) 5 ML SYRINGE
INTRAMUSCULAR | Status: DC | PRN
  Administered 2021-02-20: 100 mg via INTRAVENOUS

## 2021-02-20 MED ORDER — ONDANSETRON HCL 4 MG/2ML IJ SOLN
INTRAMUSCULAR | Status: AC
  Filled 2021-02-20: qty 2

## 2021-02-20 MED ORDER — KETOROLAC TROMETHAMINE 30 MG/ML IJ SOLN: 15.0000 mg | Freq: Once | INTRAMUSCULAR | Status: DC | PRN

## 2021-02-20 MED ORDER — IOHEXOL 300 MG/ML  SOLN
INTRAMUSCULAR | Status: DC | PRN
  Administered 2021-02-20: 29 mL

## 2021-02-20 MED ORDER — ORAL CARE MOUTH RINSE: 15.0000 mL | Freq: Once | OROMUCOSAL | Status: AC

## 2021-02-20 MED ORDER — PROPOFOL 10 MG/ML IV BOLUS
INTRAVENOUS | Status: AC
  Filled 2021-02-20: qty 20

## 2021-02-20 MED ORDER — CEPHALEXIN 500 MG PO CAPS: 500.0000 mg | ORAL_CAPSULE | Freq: Two times a day (BID) | ORAL | 0 refills | Status: AC

## 2021-02-20 MED ORDER — FENTANYL CITRATE (PF) 100 MCG/2ML IJ SOLN
INTRAMUSCULAR | Status: DC | PRN
  Administered 2021-02-20 (×4): 25 ug via INTRAVENOUS

## 2021-02-20 MED ORDER — HYDROCODONE-ACETAMINOPHEN 10-325 MG PO TABS
1.0000 | ORAL_TABLET | Freq: Once | ORAL | Status: AC
  Administered 2021-02-20: 1 via ORAL
  Filled 2021-02-20: qty 1

## 2021-02-20 MED ORDER — ONDANSETRON HCL 4 MG/2ML IJ SOLN: 4.0000 mg | Freq: Once | INTRAMUSCULAR | Status: DC | PRN

## 2021-02-20 MED ORDER — ONDANSETRON HCL 4 MG/2ML IJ SOLN
INTRAMUSCULAR | Status: DC | PRN
  Administered 2021-02-20: 4 mg via INTRAVENOUS

## 2021-02-20 MED ORDER — SODIUM CHLORIDE 0.9 % IR SOLN
Status: DC | PRN
  Administered 2021-02-20: 6000 mL via INTRAVESICAL

## 2021-02-20 MED ORDER — LACTATED RINGERS IV SOLN: INTRAVENOUS | Status: DC

## 2021-02-20 MED ORDER — LIDOCAINE 2% (20 MG/ML) 5 ML SYRINGE
INTRAMUSCULAR | Status: AC
  Filled 2021-02-20: qty 5

## 2021-02-20 MED ORDER — EPHEDRINE SULFATE-NACL 50-0.9 MG/10ML-% IV SOSY
PREFILLED_SYRINGE | INTRAVENOUS | Status: DC | PRN
  Administered 2021-02-20 (×4): 5 mg via INTRAVENOUS

## 2021-02-20 MED ORDER — EPHEDRINE 5 MG/ML INJ
INTRAVENOUS | Status: AC
  Filled 2021-02-20: qty 10

## 2021-02-20 MED ORDER — DEXAMETHASONE SODIUM PHOSPHATE 10 MG/ML IJ SOLN
INTRAMUSCULAR | Status: DC | PRN
  Administered 2021-02-20: 5 mg via INTRAVENOUS

## 2021-02-20 MED ORDER — ONDANSETRON HCL 4 MG PO TABS: 4.0000 mg | ORAL_TABLET | Freq: Every day | ORAL | 0 refills | Status: AC | PRN

## 2021-02-20 MED ORDER — DOCUSATE SODIUM 100 MG PO CAPS: 100.0000 mg | ORAL_CAPSULE | Freq: Every day | ORAL | 0 refills | Status: AC | PRN

## 2021-02-20 SURGICAL SUPPLY — 21 items
BAG URO CATCHER STRL LF (MISCELLANEOUS) ×2 IMPLANT
BASKET ZERO TIP NITINOL 2.4FR (BASKET) ×2 IMPLANT
CATH URET 5FR 28IN OPEN ENDED (CATHETERS) ×2 IMPLANT
CLOTH BEACON ORANGE TIMEOUT ST (SAFETY) ×2 IMPLANT
FIBER LASER MOSES 200 DFL (Laser) IMPLANT
GLOVE SURG ENC TEXT LTX SZ7 (GLOVE) ×6 IMPLANT
GOWN STRL REUS W/TWL LRG LVL3 (GOWN DISPOSABLE) ×4 IMPLANT
GUIDEWIRE STR DUAL SENSOR (WIRE) ×2 IMPLANT
GUIDEWIRE ZIPWRE .038 STRAIGHT (WIRE) ×2 IMPLANT
IV NS 1000ML (IV SOLUTION) ×2
IV NS 1000ML BAXH (IV SOLUTION) ×1 IMPLANT
KIT TURNOVER KIT A (KITS) ×2 IMPLANT
LASER FIB FLEXIVA PULSE ID 365 (Laser) IMPLANT
MANIFOLD NEPTUNE II (INSTRUMENTS) ×2 IMPLANT
PACK CYSTO (CUSTOM PROCEDURE TRAY) ×2 IMPLANT
SHEATH URETERAL 12FRX35CM (MISCELLANEOUS) IMPLANT
STENT URET 6FRX24 CONTOUR (STENTS) ×2 IMPLANT
TRACTIP FLEXIVA PULS ID 200XHI (Laser) ×1 IMPLANT
TRACTIP FLEXIVA PULSE ID 200 (Laser) ×2
TUBING CONNECTING 10 (TUBING) ×2 IMPLANT
TUBING UROLOGY SET (TUBING) IMPLANT

## 2021-02-20 NOTE — Anesthesia Postprocedure Evaluation (Signed)
Anesthesia Post Note  Patient: Marilyn Myers  Procedure(s) Performed: CYSTOSCOPY RETROGRADE/URETEROSCOPY/HOLMIUM LASER/STENT PLACEMENT/BASKET STONE EXTRACTION (Right )     Patient location during evaluation: PACU Anesthesia Type: General Level of consciousness: awake and alert Pain management: pain level controlled Vital Signs Assessment: post-procedure vital signs reviewed and stable Respiratory status: spontaneous breathing, nonlabored ventilation, respiratory function stable and patient connected to nasal cannula oxygen Cardiovascular status: blood pressure returned to baseline and stable Postop Assessment: no apparent nausea or vomiting Anesthetic complications: no   No complications documented.  Last Vitals:  Vitals:   02/20/21 1345 02/20/21 1353  BP: (!) 154/81   Pulse: 74 74  Resp: 18 16  Temp:  36.5 C  SpO2: 95% 95%    Last Pain:  Vitals:   02/20/21 1353  TempSrc:   PainSc: 0-No pain                 [name]

## 2021-02-20 NOTE — Progress Notes (Signed)
Dr. Kalman Shan aware pt had coffee with creamer today @ 0600. Ok to proceed , no further orders received

## 2021-02-20 NOTE — Anesthesia Procedure Notes (Signed)
Procedure Name: LMA Insertion Date/Time: 02/20/2021 12:11 PM Performed by: Maxwell Caul, CRNA Pre-anesthesia Checklist: Patient identified, Emergency Drugs available, Suction available and Patient being monitored Patient Re-evaluated:Patient Re-evaluated prior to induction Oxygen Delivery Method: Circle system utilized Preoxygenation: Pre-oxygenation with 100% oxygen Induction Type: IV induction LMA: LMA inserted LMA Size: 4.0 Number of attempts: 1 Placement Confirmation: positive ETCO2 and breath sounds checked- equal and bilateral Tube secured with: Tape Dental Injury: Teeth and Oropharynx as per pre-operative assessment

## 2021-02-20 NOTE — Anesthesia Preprocedure Evaluation (Signed)
Anesthesia Evaluation  Patient identified by MRN, date of birth, ID band Patient awake    Reviewed: Allergy & Precautions, NPO status , Patient's Chart, lab work & pertinent test results  Airway Mallampati: II  TM Distance: >3 FB Neck ROM: Full    Dental no notable dental hx.    Pulmonary sleep apnea , former smoker,    Pulmonary exam normal breath sounds clear to auscultation       Cardiovascular hypertension, Normal cardiovascular exam Rhythm:Regular Rate:Normal     Neuro/Psych negative neurological ROS  negative psych ROS   GI/Hepatic negative GI ROS, Neg liver ROS,   Endo/Other  diabetes, Type 2  Renal/GU negative Renal ROS  negative genitourinary   Musculoskeletal negative musculoskeletal ROS (+)   Abdominal   Peds negative pediatric ROS (+)  Hematology negative hematology ROS (+)   Anesthesia Other Findings   Reproductive/Obstetrics negative OB ROS                             Anesthesia Physical Anesthesia Plan  ASA: II  Anesthesia Plan: General   Post-op Pain Management:    Induction: Intravenous  PONV Risk Score and Plan: 3 and Ondansetron, Dexamethasone and Treatment may vary due to age or medical condition  Airway Management Planned: LMA  Additional Equipment:   Intra-op Plan:   Post-operative Plan: Extubation in OR  Informed Consent: I have reviewed the patients History and Physical, chart, labs and discussed the procedure including the risks, benefits and alternatives for the proposed anesthesia with the patient or authorized representative who has indicated his/her understanding and acceptance.     Dental advisory given  Plan Discussed with: CRNA and Surgeon  Anesthesia Plan Comments:         Anesthesia Quick Evaluation

## 2021-02-20 NOTE — Discharge Instructions (Signed)
[facility] [phone] Post Ureteroscopy With or Without Stent Instructions  Definitions:  Ureter: The duct that transports urine from the kidney to the bladder. Stent:   A plastic hollow tube that is placed into the ureter, from the kidney to the bladder to prevent the ureter from swelling shut.  GENERAL INSTRUCTIONS:  Despite the fact that no skin incisions were used, the area around the ureter and bladder is raw and irritated. The stent is a foreign body which will further irritate the bladder wall. This irritation is manifested by increased frequency of urination, both day and night, and by an increase in the urge to urinate. In some, the urge to urinate is present almost always. Sometimes the urge is strong enough that you may not be able to stop yourself from urinating. The only real cure is to remove the stent and then give time for the bladder wall to heal which can't be done until the danger of the ureter swelling shut has passed, which varies.  You may see some blood in your urine while the stent is in place and a few days afterwards. Do not be alarmed, even if the urine was clear for a while. Get off your feet and drink lots of fluids until clearing occurs. If you start to pass clots or don't improve, call us.  DIET: You may return to your normal diet immediately. Because of the raw surface of your bladder, alcohol, spicy foods, acid type foods and drinks with caffeine may cause irritation or frequency and should be used in moderation. To keep your urine flowing freely and to avoid constipation, drink plenty of fluids during the day ( 8-10 glasses ). Tip: Avoid cranberry juice because it is very acidic.  ACTIVITY: Your physical activity doesn't need to be restricted. However, if you are very active, you may see some blood in your urine. We suggest that you reduce your activity under these circumstances until the bleeding has stopped.  BOWELS: It is important to  keep your bowels regular during the postoperative period. Straining with bowel movements can cause bleeding. A bowel movement every other day is reasonable. Use a mild laxative if needed, such as Milk of Magnesia 2-3 tablespoons, or 2 Dulcolax tablets. Call if you continue to have problems. If you have been taking narcotics for pain, before, during or after your surgery, you may be constipated. Take a laxative if necessary.   MEDICATION: You should resume your pre-surgery medications unless told not to. In addition you will often be given an antibiotic to prevent infection. These should be taken as prescribed until the bottles are finished unless you are having an unusual reaction to one of the drugs.  PROBLEMS YOU SHOULD REPORT TO [place]:  Fevers over 100.5 Fahrenheit.  Heavy bleeding, or clots ( See above notes about blood in urine ).  Inability to urinate.  Drug reactions ( hives, rash, nausea, vomiting, diarrhea ).  Severe burning or pain with urination that is not improving.  FOLLOW-UP: You will need a follow-up appointment to monitor your progress. Call for this appointment at the number listed above. Usually the first appointment will be about three to fourteen days after your surgery.  Remove your stent by pulling on attached string on Monday morning.

## 2021-02-20 NOTE — Transfer of Care (Signed)
Immediate Anesthesia Transfer of Care Note  Patient: Marilyn Myers  Procedure(s) Performed: CYSTOSCOPY RETROGRADE/URETEROSCOPY/HOLMIUM LASER/STENT PLACEMENT/BASKET STONE EXTRACTION (Right )  Patient Location: PACU  Anesthesia Type:General  Level of Consciousness: awake, alert  and oriented  Airway & Oxygen Therapy: Patient Spontanous Breathing and Patient connected to face mask oxygen  Post-op Assessment: Report given to RN and Post -op Vital signs reviewed and stable  Post vital signs: Reviewed and stable  Last Vitals:  Vitals Value Taken Time  BP 152/88 02/20/21 1319  Temp    Pulse 77 02/20/21 1320  Resp 18 02/20/21 1320  SpO2 100 % 02/20/21 1320  Vitals shown include unvalidated device data.  Last Pain:  Vitals:   02/20/21 1024  TempSrc:   PainSc: 0-No pain         Complications: No complications documented.

## 2021-02-20 NOTE — H&P (Addendum)
Office Visit Report     01/26/2021   --------------------------------------------------------------------------------   Marilyn Myers  MRN: [id number]  DOB: 06-01-1951, 70 year old Female  SSN: [id number]   PRIMARY CARE:  [place]. Jonni Sanger, MD  REFERRING:    PROVIDER:  Rexene Alberts, M.D.  LOCATION:  [facility] [phone]     --------------------------------------------------------------------------------   CC/HPI: Marilyn Myers is a 70 year old female seen in consultation today for a distal right ureteral stone.   She has a history of urolithiasis with first episode in 06/2011 has had at least 6 stones prior to this episode. He has a history of ESWL, ureteroscopy with laser lithotripsy.   Per chart review, CT A/P 11/03/2020 demonstrated an obstructing 3 x 4 mm stone at the mid right ureter with mild right hydronephrosis. He is also found to have superimposed bilateral nonobstructing stones which are noted to be small up to 3 mm.   She presented to the ED on 01/21/2021 with right-sided flank pain. CT A/P 01/22/2021 revealed nonobstructing 3 mm right mid ureteral stone with mild fullness of the collecting system which was decreased since prior study.   She also has bilateral simple cortical cysts which have no suspicion for malignancy.   She states she still has some intermittent right flank pain that is well controlled. She denies fevers or chills. She denies dysuria or hematuria.     ALLERGIES: ACE Inhibitors Codeine Derivatives Tamsulosin HCl CAPS    MEDICATIONS: Hydrochlorothiazide  Biotin 5 mg capsule Oral  Calcium + Vitamin D3  Cartia Xt 120 mg capsule, ext release 24 hr Oral  Cephalexin  Diazepam  Fiber TABS Oral  Gabapentin 300 mg capsule Oral  Hydrocodone-Acetaminophen  Losartan Potassium-HCTZ 100-25 MG Oral Tablet Oral  MetFORMIN HCl - 1000 MG Oral Tablet Oral  Multiple Vitamins tablet Oral  Pantoprazole Sodium  Prednisone  Vitamin B12  Vitamin C      GU PSH: Cysto Uretero Biopsy Fulgura - 2015 Cysto Uretero Lithotripsy - 2012, 2012 Cystoscopy Insert Stent - 2015, 2012, 2012, 2012, 2012 ESWL - 2013, 2012 Ureteroscopic stone removal - 2012       [place] Notes: Colon Surgery, Cystoscopy With Insertion Of Ureteral Stent Right, Cystoscopy With Ureteroscopy For Biopsy Right, Lithotripsy, Cystoscopy With Ureteroscopy With Lithotripsy, Cystoscopy With Insertion Of Ureteral Stent Right, Lithotripsy, Cystoscopy With Ureteroscopy With Lithotripsy, Cystoscopy With Insertion Of Ureteral Stent Right, Cystoscopy With Ureteroscopy With Manipulation Of Calculus, Cystoscopy With Insertion Of Ureteral Stent, Cystoscopy With Insertion Of Ureteral Stent Bilateral   NON-GU PSH: None   GU PMH: Renal calculus - 2017, Nephrolithiasis, - 2017, Kidney stone on right side, - 2014, Kidney stone on left side, - 2014 Other microscopic hematuria, Microscopic hematuria - 2017 Ureteral calculus, Calculus of right ureter - 2017, Calculus of ureter, - 2015, Calculus of ureter, - 2014 Urinary Tract Inf, Unspec site, Suspected urinary tract infection - 2017, Urinary tract infection, - 2015 Urinary Frequency, Urinary frequency - 2017 Dysuria, Dysuria - 2016 Hydronephrosis Unspec, Hydronephrosis - 2014    NON-GU PMH: Enterocolitis due to Clostridium difficile, C. difficile colitis - 2016 Encounter for general adult medical examination without abnormal findings, Encounter for preventive health examination - 2015 Personal history of other diseases of the circulatory system, History of hypertension - 2014 Personal history of other endocrine, nutritional and metabolic disease, History of diabetes mellitus - 2014 Diabetes Type 2 GERD Hypertension    FAMILY HISTORY: 3 Son's - Runs in Family 4 daughters - Runs in Family Father  Deceased At [place] ___ - Runs In [place] - Father Mother Deceased At Age 64 from diabetic complicati - Runs In Family   SOCIAL HISTORY:  Marital Status: Widowed Preferred Language: English; Ethnicity: Not Hispanic Or Latino; Race: White Current Smoking Status: Patient does not smoke anymore. Has not smoked since 07/15/1994.  Has never drank.  Does not use drugs. Does not drink caffeine.     Notes: Former smoker, Caffeine Use, Alcohol Use, Marital History - Currently Married   REVIEW OF SYSTEMS:    GU Review Female:   Patient reports frequent urination, get up at night to urinate, stream starts and stops, trouble starting your stream, and have to strain to urinate. Patient denies hard to postpone urination, burning /pain with urination, leakage of urine, and being pregnant.  Gastrointestinal (Upper):   Patient reports nausea. Patient denies vomiting and indigestion/ heartburn.  Gastrointestinal (Lower):   Patient reports constipation. Patient denies diarrhea.  Constitutional:   Patient denies fever, night sweats, weight loss, and fatigue.  Skin:   Patient reports itching. Patient denies skin rash/ lesion.  Eyes:   Patient denies blurred vision and double vision.  Ears/ Nose/ Throat:   Patient denies sore throat and sinus problems.  Hematologic/Lymphatic:   Patient denies swollen glands and easy bruising.  Cardiovascular:   Patient denies leg swelling and chest pains.  Respiratory:   Patient denies cough and shortness of breath.  Endocrine:   Patient denies excessive thirst.  Musculoskeletal:   Patient reports back pain. Patient denies joint pain.  Neurological:   Patient reports headaches. Patient denies dizziness.  Psychologic:   Patient reports anxiety. Patient denies depression.   VITAL SIGNS:      01/26/2021 10:33 AM  Weight 205 lb / 92.99 kg  Height 66.5 in / 168.91 cm  BP 127/84 mmHg  Pulse 85 /min  Temperature 97.5 F / 36.3 C  BMI 32.6 kg/m   MULTI-SYSTEM PHYSICAL EXAMINATION:    Constitutional: Well-nourished. No physical deformities. Normally developed. Good grooming.  Respiratory: No labored breathing, no  use of accessory muscles.   Cardiovascular: Normal temperature, normal extremity pulses, no swelling, no varicosities.  Gastrointestinal: No mass, no tenderness, no rigidity, obese with large lower abdominal fat Right > left, central abdominal scar; NO CVA T b/l     Complexity of Data:  Source Of History:  Patient, Medical Record Summary  Records Review:   Previous Doctor Records, Previous Hospital Records  Urine Test Review:   Urinalysis  X-Ray Review: C.T. Abdomen/Pelvis: Reviewed Films. Reviewed Report. Discussed With Patient.    Notes:                     CLINICAL DATA: Right flank pain   EXAM:  CT ABDOMEN AND PELVIS WITHOUT CONTRAST   TECHNIQUE:  Multidetector CT imaging of the abdomen and pelvis was performed  following the standard protocol without IV contrast.   COMPARISON: 11/03/2020   FINDINGS:  Lower chest: No acute abnormality.   Hepatobiliary: Layering gallstones within the gallbladder. No focal  hepatic abnormality.   Pancreas: No focal abnormality or ductal dilatation.   Spleen: No focal abnormality. Normal size.   Adrenals/Urinary Tract: Bilateral nonobstructing renal stones.  Nonobstructing 3 mm mid right ureteral stone at the pelvic brim,  unchanged in position since prior study. Mild fullness of the left  renal collecting system and ureter, decreased since prior study. No  ureteral stones on the left. Bilateral renal cysts are unchanged.  Urinary bladder  is unremarkable.   Stomach/Bowel: Prior gastric sleeve. No evidence of bowel  obstruction. Moderate stool throughout the colon. Very large ventral  hernia noted containing the distal stomach and numerous small bowel  loops as well as the right colon and transverse colon. This is  unchanged.   Vascular/Lymphatic: Diffuse aortic atherosclerosis. No evidence of  aneurysm or adenopathy.   Reproductive: Uterus and right adnexa unremarkable. 3.4 cm cyst in  the left ovary, unchanged since prior study.    Other: No free fluid or free air.   Musculoskeletal: No acute bony abnormality.   IMPRESSION:  Stable position of the 3 mm mid right ureteral stone at the pelvic  brim. Previously seen right hydronephrosis has decreased since prior  study.   Bilateral nephrolithiasis.   Stable bilateral renal cysts.   Cholelithiasis.   Large ventral hernia containing portions of the stomach, small bowel  and colon.   Diffuse aortic atherosclerosis.   Stable left ovarian cyst.    Electronically Signed  By: Rolm Baptise M.D.  On: 01/22/2021 11:09   PROCEDURES:          Urinalysis w/Scope Dipstick Dipstick Cont'd Micro  Color: Amber Bilirubin: Neg mg/dL WBC/hpf: 10 - 20/hpf  Appearance: Cloudy Ketones: Neg mg/dL RBC/hpf: 0 - 2/hpf  Specific Gravity: 1.025 Blood: Neg ery/uL Bacteria: Rare (0-9/hpf)  pH: 6.0 Protein: 1+ mg/dL Cystals: Amorph Urates  Glucose: Neg mg/dL Urobilinogen: 1.0 mg/dL Casts: NS (Not Seen)    Nitrites: Neg Trichomonas: Not Present    Leukocyte Esterase: 1+ leu/uL Mucous: Not Present      Epithelial Cells: 0 - 5/hpf      Yeast: Few (1 - 5/hpf)      Sperm: Not Present    ASSESSMENT:      ICD-10 Details  1 GU:   Ureteral calculus - N20.1   2   Renal calculus - N20.0   3   Renal cyst - N28.1    PLAN:           Orders Labs CULTURE, URINE          Document Letter(s):  Created for Patient: Clinical Summary         Notes:    #1. Right ureteral stone: Measuring 3 mm on CT A/P 01/22/2021. Is been present since his CT A/P 11/03/2020. Recommend cystoscopy with ureteroscopy with laser lithotripsy and basket extraction of stone. Obtain preoperative urine culture today. Surgery letter sent.   #2. Bilateral renal stones: Very small bilaterally. Recommend continued observation at this time.   3. Bilateral renal cysts: No suspicion of malignancy. No need for further surveillance.   CC: Billey Chang, MD        Next Appointment:      Next Appointment: 02/24/2021  01:00 PM    Appointment Type: 59 Minute New Patient    Location: [facility] [phone]    Provider: Rexene Alberts, M.D.    Reason for Visit: NP / self appt. right kidney discomfort      Signed by Rexene Alberts, M.D. on 01/26/21 at 4:11 PM (EST)  Urology Preoperative H&P   Chief Complaint: Right ureteral stone  History of Present Illness: Marilyn Myers is a 70 y.o. female with right ureteral stone here for cysto, R RPG, R URS/LL, R stent placement. Denies fevers or chills.    Past Medical History:  Diagnosis Date  . Anxiety   . Diverticulitis of intestine without perforation or abscess   . Diverticulosis, sigmoid   .  GERD (gastroesophageal reflux disease)   . History of diverticulitis of colon    09/ 2015  RESOLVED   . History of kidney stones   . Hypertension   . Nephrolithiasis    BILATERAL  . OSA on CPAP    no longer uses   . Right ureteral stone   . Seasonal allergic rhinitis 01/24/2018  . Type 2 diabetes mellitus ([place])    type 2     Past Surgical History:  Procedure Laterality Date  . BARIATRIC SURGERY    . CERVICAL CONE BIOPSY  1976  . COLON RESECTION     colostomy x 6 months  . CYSTO/  BILATERAL RETROGRADE PYELOGRAM/  PLACEMENT BILATERAL URETERAL STENTS  07-10-2011  . CYSTOSCOPY WITH RETROGRADE PYELOGRAM, URETEROSCOPY AND STENT PLACEMENT Right 10/11/2014   Procedure: CYSTOSCOPY WITH RETROGRADE PYELOGRAM, URETEROSCOPY, right ureteral biopsy, AND STENT PLACEMENT;  Surgeon: Arvil Persons, MD;  Location: [facility];  Service: Urology;  Laterality: Right;  . CYSTOSCOPY/RETROGRADE/URETEROSCOPY  10/08/2011   Procedure: CYSTOSCOPY/RETROGRADE/URETEROSCOPY;  Surgeon: Hanley Ben, MD;  Location[facility];  Service: Urology;  Laterality: N/A;  CYSTOSCOPY, RIGHT  RETROGRADE, RIGHT URETEROSCOPY HOLMIUM LASER WITH BASKET STONE EXTRACTION AND RIGHT URETERAL STENT PLACEMENT  . EXTRACORPOREAL SHOCK WAVE LITHOTRIPSY  x3  last  one 01-24-2012  . TUBAL LIGATION  1987  . URETEROSCPIC STONE EXTRACTION/ STENT PLACEMENT  left 08-20-2011    Allergies:  Allergies  Allergen Reactions  . Ace Inhibitors Cough  . Codeine Nausea And Vomiting  . Flomax [Tamsulosin Hcl] Itching    Family History  Problem Relation Age of Onset  . Breast cancer Mother        43  . Renal Disease Mother   . Heart disease Mother   . Diabetes Paternal Grandmother   . Diabetes Maternal Aunt     Social History:  reports that she quit smoking about 24 years ago. Her smoking use included cigarettes. She has a 25.00 pack-year smoking history. She has never used smokeless tobacco. She reports that she does not drink alcohol and does not use drugs.  ROS: A complete review of systems was performed.  All systems are negative except for pertinent findings as noted.  Physical Exam:  Vital signs in last 24 hours: Temp:  [98.1 F (36.7 C)] 98.1 F (36.7 C) (04/01 1002) Pulse Rate:  [70] 70 (04/01 1002) Resp:  [18] 18 (04/01 1002) BP: (141)/(75) 141/75 (04/01 1002) SpO2:  [100 %] 100 % (04/01 1002) Weight:  [91.6 kg] 91.6 kg (04/01 1002) Constitutional:  Alert and oriented, No acute distress Cardiovascular: Regular rate and rhythm Respiratory: Normal respiratory effort, Lungs clear bilaterally GI: Abdomen is soft, nontender, nondistended, no abdominal masses GU: No CVA tenderness Lymphatic: No lymphadenopathy Neurologic: Grossly intact, no focal deficits Psychiatric: Normal mood and affect  Laboratory Data:  No results for input(s): WBC, HGB, HCT, PLT in the last 72 hours.  No results for input(s): NA, K, CL, GLUCOSE, BUN, CALCIUM, CREATININE in the last 72 hours.  Invalid input(s): CO3   Results for orders placed or performed during the hospital encounter of 02/20/21 (from the past 24 hour(s))  Glucose, capillary     Status: Abnormal   Collection Time: 02/20/21  9:57 AM  Result Value Ref Range   Glucose-Capillary 135 (H) 70 - 99  mg/dL   Comment 1 Notify RN    Recent Results (from the past 240 hour(s))  SARS CORONAVIRUS 2 (TAT 6-24 HRS) Nasopharyngeal Nasopharyngeal Swab  Status: None   Collection Time: 02/17/21 10:43 AM   Specimen: Nasopharyngeal Swab  Result Value Ref Range Status   SARS Coronavirus 2 NEGATIVE NEGATIVE Final    Comment: (NOTE) SARS-CoV-2 target nucleic acids are NOT DETECTED.  The SARS-CoV-2 RNA is generally detectable in upper and lower respiratory specimens during the acute phase of infection. Negative results do not preclude SARS-CoV-2 infection, do not rule out co-infections with other pathogens, and should not be used as the sole basis for treatment or other patient management decisions. Negative results must be combined with clinical observations, patient history, and epidemiological information. The expected result is Negative.  Fact Sheet for Patients: SugarRoll.be  Fact Sheet for Healthcare Providers: https://www.woods-mathews.com/  This test is not yet approved or cleared by the [place] FDA and  has been authorized for detection and/or diagnosis of SARS-CoV-2 by FDA under an Emergency Use Authorization (EUA). This EUA will remain  in effect (meaning this test can be used) for the duration of the COVID-19 declaration under Se ction 564(b)(1) of the Act, 21 U.S.C. section 360bbb-3(b)(1), unless the authorization is terminated or revoked sooner.  Performed at [facility], [place] [address] [place], [place] [address]     Renal Function: Recent Labs    02/17/21 0370  CREATININE 1.13*   Estimated Creatinine Clearance: 52.6 mL/min (A) (by C-G formula based on SCr of 1.13 mg/dL (H)).  Radiologic Imaging: No results found.  I independently reviewed the above imaging studies.  Assessment and Plan Marilyn Myers is a 70 y.o. female with right ureteral stone here for cysto, R RPG, R URS/LL, R stent placement. Denies  fevers or chills. Preop urine cx 01/26/2021 negative.  -The risks, benefits and alternatives of cystoscopy with right URS/LL, right JJ stent placement was discussed with the patient.  Risks include, but are not limited to: bleeding, urinary tract infection, ureteral injury, ureteral stricture disease, chronic pain, urinary symptoms, bladder injury, stent migration, the need for nephrostomy tube placement, MI, CVA, DVT, PE and the inherent risks with general anesthesia.  The patient voices understanding and wishes to proceed.    Matt R. Mohamud Mrozek MD 02/20/2021, 10:13 AM  [facility] Pager: [phone]): [phone]

## 2021-02-20 NOTE — Op Note (Signed)
Operative Note  Preoperative diagnosis:  1.  Right ureteral and renal stones  Postoperative diagnosis: 1.  Right ureteral and renal stones  Procedure(s): 1.  Cystoscopy 2.  Right retrograde pyelogram 3.  Right ureteroscopy with laser lithotripsy and basket traction of stone 4.  Right ureteral stent placement 5.  Fluoroscopy less than 1 hour  Surgeon: Rexene Alberts, MD  Assistants:  None  Anesthesia:  General  Complications:  None  EBL:  Minimal  Specimens: 1.  Stones for stone analysis  Drains/Catheters: 1.  6 French by 24 cm right ureteral stent attached to a tether string  Intraoperative findings:   1. Cystoscopy demonstrated no suspicious bladder lesions.  Bilateral ureteral orifices were in their normal position. 2. Right semirigid ureteroscopy demonstrated a 3 mm right mid ureteral stone which was basket extracted without difficulty. 3. Ureteroscopy also demonstrated 2 separate well-circumscribed herniating mucosa into the right ureter.  These did not appear to be suspicious for cancer or cause of obstruction..  They were not papillary appearing.  I did unroof the most distal lesion with no drainage of fluid and no extravasation of contrast.  The most proximal hernia mucosa was left intact as this did not appear to be obstructing. 4. Right retrograde pyelogram demonstrated chronic severe right hydroureteronephrosis to the level of the bladder. 5. Successful fragmentation of right renal stones and basket extraction of the stones.  The remaining fragments were tiny and irrigated away less than 1 mm. 6. Some chronic debris within the severely distended right renal pelvis. 7. Successful right ureteral stent placement  Indication:  Marilyn Myers is a 70 y.o. female seen to have a right distal ureteral stone seen on CT A/P 11/03/2020 which was 4 mm at the right mid ureter with mild right hydronephrosis.  This was again seen on CT A/P 01/22/2021.  After thorough discussion  including all relevant risk benefits alternatives, she presents the operating today for definitive treatment of her right sided ureteral and renal stones.  Description of procedure: After informed consent was obtained from the patient, the patient was identified and taken to the operating room and placed in the supine position.  General anesthesia was administered as well as perioperative IV antibiotics.  At the beginning of the case, a time-out was performed to properly identify the patient, the surgery to be performed, and the surgical site.  Sequential compression devices were applied to the lower extremities at the beginning of the case for DVT prophylaxis.  The patient was then placed in the dorsal lithotomy supine position, prepped and draped in sterile fashion.  Preliminary scout fluoroscopy revealed that there was a 63mm calcification area at the mid right ureter, which corresponds to the stone found on the preoperative CT scan. We then passed the 21-French rigid cystoscope through the urethra and into the bladder under vision without any difficulty , noting a normal urethra.  A systematic evaluation of the bladder revealed no evidence of any suspicious bladder lesions.  Ureteral orifices were in normal position.    Under cystoscopic and flouroscopic guidance, we cannulated the right ureteral orifice with a 5-French open-ended ureteral catheter and a gentle retrograde pyelogram was performed, revealing a normal caliber ureter without any filling defects. There was chronic right hydroureteronephrosis from the level of the bladder to the collecting system. A 0.038 sensor wire was then passed up to the level of the renal pelvis and secured to the drape as a safety wire. The ureteral catheter and cystoscope were removed, leaving  the safety wire in place.   A semi-rigid ureteroscope was passed alongside the wire up the distal ureter which appeared normal.  There we encountered a 3 mm ureteral stone.  As  is quite small in the ureter was quite patent, I used a 0 tip basket to grab this and remove this without difficulty.  I then replaced my semirigid ureteroscope where I encountered ballooning herniating mucosa in the mid right ureter as well as the proximal ureter.  I did unroofed the most distal area mucosa with no drainage of fluid and no extravasation of contrast following this.  I left the most proximal one intact as this did not appear to be obstructing.  Both of these herniating mucosal areas were not concerning for malignancy.  They were not papillary and however very smooth normal-appearing mucosa.   A second 0.038 zip wire was passed under direct vision and the semirigid scope was removed. The flexible ureteroscope was advanced into the collecting system. The collecting system was inspected.  Identified 3 separate stones within the collecting system.  Using the 200 micron holmium laser fiber, the stones were fragmented completely. A 2.2 Fr zero tip basket was used to remove the fragments under visual guidance. These were sent for chemical analysis. With the ureteroscope in the kidney, a gentle pyelogram was performed to delineate the calyceal system and we evaluated the calyces systematically. We encountered no further large stone fragments. The rest of the stone fragments were very tiny and these were  irrigated away gently. The calyces were re-inspected and there were no significant stone fragment residual.  There was some chronic appearing infectious debris material within the collecting system that irrigated away easily.  We then withdrew the ureteroscope back down the ureter noting no evidence of any stones along the course of the ureter. Once the ureteroscope was removed, the Glidewire was backloaded through the rigid cystoscope, which was then advanced down the urethra and into the bladder. We then used the Glidewire under direct vision through the rigid cystoscope and under fluoroscopic guidance  and passed up a 6-French, 24 cm double-pigtail ureteral stent up ureter, making sure that the proximal and distal ends coiled within the kidney and bladder respectively.  Note that we left a long tether string attached to the distal end of the ureteral stent and it exited the urethral meatus and was secured to the inner thigh with a tegaderm adhesive.  The cystoscope was then advanced back into the bladder under vision.  We were able to see the distal stent coiling nicely within the bladder.  The bladder was then emptied with irrigation solution.  The cystoscope was then removed.    The patient tolerated the procedure well and there was no complication. Patient was awoken from anesthesia and taken to the recovery room in stable condition. I was present and scrubbed for the entirety of the case.  Plan:  Patient will be discharged home.  She will remove her ureteral stent in approximately 3 days by pulling attached string.  [facility]  Pager: [phone]

## 2021-02-21 ENCOUNTER — Encounter (HOSPITAL_COMMUNITY): Payer: Self-pay | Admitting: Urology

## 2021-02-24 DIAGNOSIS — N200 Calculus of kidney: Secondary | ICD-10-CM | POA: Diagnosis not present

## 2021-02-26 ENCOUNTER — Telehealth: Payer: Self-pay

## 2021-02-26 NOTE — Telephone Encounter (Signed)
Patient had kidney stone removal last Friday and has been constipated and nauseous the last few days and throwing up bile  She was prescribed medication for nausea from the provider and wants to know if she should take medication or come in for apt. Patient is requesting a phone call  Please advise Thank you

## 2021-02-27 ENCOUNTER — Other Ambulatory Visit: Payer: Self-pay | Admitting: Physician Assistant

## 2021-02-27 ENCOUNTER — Ambulatory Visit
Admission: RE | Admit: 2021-02-27 | Discharge: 2021-02-27 | Disposition: A | Discharge status: Home / Self Care | Payer: Medicare Other | Source: Ambulatory Visit | Attending: Physician Assistant | Admitting: Physician Assistant

## 2021-02-27 DIAGNOSIS — M5459 Other low back pain: Secondary | ICD-10-CM

## 2021-02-27 DIAGNOSIS — R200 Anesthesia of skin: Secondary | ICD-10-CM | POA: Diagnosis not present

## 2021-02-27 DIAGNOSIS — M545 Low back pain, unspecified: Secondary | ICD-10-CM | POA: Diagnosis not present

## 2021-02-27 DIAGNOSIS — Z79891 Long term (current) use of opiate analgesic: Secondary | ICD-10-CM | POA: Diagnosis not present

## 2021-02-27 DIAGNOSIS — G894 Chronic pain syndrome: Secondary | ICD-10-CM | POA: Diagnosis not present

## 2021-02-27 DIAGNOSIS — G629 Polyneuropathy, unspecified: Secondary | ICD-10-CM | POA: Diagnosis not present

## 2021-02-27 DIAGNOSIS — M79671 Pain in right foot: Secondary | ICD-10-CM | POA: Diagnosis not present

## 2021-02-27 DIAGNOSIS — Z79899 Other long term (current) drug therapy: Secondary | ICD-10-CM | POA: Diagnosis not present

## 2021-02-27 NOTE — Telephone Encounter (Signed)
Patient gave verbal understanding of PCP recommendations listed below

## 2021-02-27 NOTE — Telephone Encounter (Signed)
She may use the medication for the nausea; if not helpful, then can contact urologist or schedule with me. thanks

## 2021-02-27 NOTE — Telephone Encounter (Signed)
Patient prescribed zofran. Please advise

## 2021-03-11 ENCOUNTER — Emergency Department (HOSPITAL_COMMUNITY): Payer: Medicare Other

## 2021-03-11 ENCOUNTER — Encounter (HOSPITAL_COMMUNITY): Payer: Self-pay

## 2021-03-11 ENCOUNTER — Emergency Department (HOSPITAL_COMMUNITY)
Admission: EM | Admit: 2021-03-11 | Discharge: 2021-03-13 | Disposition: A | Discharge status: Home / Self Care | Payer: Medicare Other | Attending: Emergency Medicine | Admitting: Emergency Medicine

## 2021-03-11 ENCOUNTER — Other Ambulatory Visit: Payer: Self-pay

## 2021-03-11 DIAGNOSIS — E1142 Type 2 diabetes mellitus with diabetic polyneuropathy: Secondary | ICD-10-CM | POA: Diagnosis not present

## 2021-03-11 DIAGNOSIS — I10 Essential (primary) hypertension: Secondary | ICD-10-CM | POA: Insufficient documentation

## 2021-03-11 DIAGNOSIS — R110 Nausea: Secondary | ICD-10-CM | POA: Diagnosis not present

## 2021-03-11 DIAGNOSIS — E876 Hypokalemia: Secondary | ICD-10-CM | POA: Diagnosis not present

## 2021-03-11 DIAGNOSIS — X838XXA Intentional self-harm by other specified means, initial encounter: Secondary | ICD-10-CM | POA: Diagnosis not present

## 2021-03-11 DIAGNOSIS — T402X1A Poisoning by other opioids, accidental (unintentional), initial encounter: Secondary | ICD-10-CM | POA: Insufficient documentation

## 2021-03-11 DIAGNOSIS — N3000 Acute cystitis without hematuria: Secondary | ICD-10-CM | POA: Diagnosis not present

## 2021-03-11 DIAGNOSIS — Z87891 Personal history of nicotine dependence: Secondary | ICD-10-CM | POA: Insufficient documentation

## 2021-03-11 DIAGNOSIS — Z7984 Long term (current) use of oral hypoglycemic drugs: Secondary | ICD-10-CM | POA: Insufficient documentation

## 2021-03-11 DIAGNOSIS — T1491XA Suicide attempt, initial encounter: Secondary | ICD-10-CM | POA: Diagnosis not present

## 2021-03-11 DIAGNOSIS — F322 Major depressive disorder, single episode, severe without psychotic features: Secondary | ICD-10-CM | POA: Diagnosis not present

## 2021-03-11 DIAGNOSIS — F339 Major depressive disorder, recurrent, unspecified: Secondary | ICD-10-CM | POA: Insufficient documentation

## 2021-03-11 DIAGNOSIS — Z79899 Other long term (current) drug therapy: Secondary | ICD-10-CM | POA: Insufficient documentation

## 2021-03-11 DIAGNOSIS — R0689 Other abnormalities of breathing: Secondary | ICD-10-CM | POA: Diagnosis not present

## 2021-03-11 DIAGNOSIS — R457 State of emotional shock and stress, unspecified: Secondary | ICD-10-CM | POA: Diagnosis not present

## 2021-03-11 DIAGNOSIS — T424X1A Poisoning by benzodiazepines, accidental (unintentional), initial encounter: Secondary | ICD-10-CM | POA: Insufficient documentation

## 2021-03-11 DIAGNOSIS — R45851 Suicidal ideations: Secondary | ICD-10-CM | POA: Diagnosis not present

## 2021-03-11 DIAGNOSIS — U071 COVID-19: Secondary | ICD-10-CM | POA: Insufficient documentation

## 2021-03-11 DIAGNOSIS — G8929 Other chronic pain: Secondary | ICD-10-CM | POA: Diagnosis not present

## 2021-03-11 DIAGNOSIS — Z9151 Personal history of suicidal behavior: Secondary | ICD-10-CM | POA: Diagnosis present

## 2021-03-11 DIAGNOSIS — R000 Tachycardia, unspecified: Secondary | ICD-10-CM | POA: Diagnosis not present

## 2021-03-11 DIAGNOSIS — F29 Unspecified psychosis not due to a substance or known physiological condition: Secondary | ICD-10-CM | POA: Diagnosis not present

## 2021-03-11 LAB — CBC WITH DIFFERENTIAL/PLATELET
Abs Immature Granulocytes: 0.04 10*3/uL (ref 0.00–0.07)
Basophils Absolute: 0 10*3/uL (ref 0.0–0.1)
Basophils Relative: 0 %
Eosinophils Absolute: 0 10*3/uL (ref 0.0–0.5)
Eosinophils Relative: 0 %
HCT: 36.5 % (ref 36.0–46.0)
Hemoglobin: 11.8 g/dL — ABNORMAL LOW (ref 12.0–15.0)
Immature Granulocytes: 0 %
Lymphocytes Relative: 14 %
Lymphs Abs: 1.7 10*3/uL (ref 0.7–4.0)
MCH: 29.4 pg (ref 26.0–34.0)
MCHC: 32.3 g/dL (ref 30.0–36.0)
MCV: 91 fL (ref 80.0–100.0)
Monocytes Absolute: 1.2 10*3/uL — ABNORMAL HIGH (ref 0.1–1.0)
Monocytes Relative: 9 %
Neutro Abs: 9.3 10*3/uL — ABNORMAL HIGH (ref 1.7–7.7)
Neutrophils Relative %: 77 %
Platelets: 303 10*3/uL (ref 150–400)
RBC: 4.01 MIL/uL (ref 3.87–5.11)
RDW: 14.5 % (ref 11.5–15.5)
WBC: 12.3 10*3/uL — ABNORMAL HIGH (ref 4.0–10.5)
nRBC: 0 % (ref 0.0–0.2)

## 2021-03-11 LAB — COMPREHENSIVE METABOLIC PANEL
ALT: 24 U/L (ref 0–44)
AST: 22 U/L (ref 15–41)
Albumin: 3.9 g/dL (ref 3.5–5.0)
Alkaline Phosphatase: 84 U/L (ref 38–126)
Anion gap: 18 — ABNORMAL HIGH (ref 5–15)
BUN: 25 mg/dL — ABNORMAL HIGH (ref 8–23)
CO2: 20 mmol/L — ABNORMAL LOW (ref 22–32)
Calcium: 9.7 mg/dL (ref 8.9–10.3)
Chloride: 101 mmol/L (ref 98–111)
Creatinine, Ser: 1.01 mg/dL — ABNORMAL HIGH (ref 0.44–1.00)
GFR, Estimated: 60 mL/min — ABNORMAL LOW (ref >60)
Glucose, Bld: 149 mg/dL — ABNORMAL HIGH (ref 70–99)
Potassium: 3 mmol/L — ABNORMAL LOW (ref 3.5–5.1)
Sodium: 139 mmol/L (ref 135–145)
Total Bilirubin: 1.3 mg/dL — ABNORMAL HIGH (ref 0.3–1.2)
Total Protein: 8.2 g/dL — ABNORMAL HIGH (ref 6.5–8.1)

## 2021-03-11 LAB — RAPID URINE DRUG SCREEN, HOSP PERFORMED
Amphetamines: NOT DETECTED
Barbiturates: NOT DETECTED
Benzodiazepines: NOT DETECTED
Cocaine: NOT DETECTED
Opiates: POSITIVE — AB
Tetrahydrocannabinol: NOT DETECTED

## 2021-03-11 LAB — URINALYSIS, ROUTINE W REFLEX MICROSCOPIC
Bilirubin Urine: NEGATIVE
Glucose, UA: NEGATIVE mg/dL
Hgb urine dipstick: NEGATIVE
Ketones, ur: 20 mg/dL — AB
Nitrite: POSITIVE — AB
Protein, ur: 100 mg/dL — AB
Specific Gravity, Urine: 1.023 (ref 1.005–1.030)
WBC, UA: >50 WBC/hpf — ABNORMAL HIGH (ref 0–5)
pH: 5 (ref 5.0–8.0)

## 2021-03-11 LAB — RESP PANEL BY RT-PCR (FLU A&B, COVID) ARPGX2
Influenza A by PCR: NEGATIVE
Influenza B by PCR: NEGATIVE
SARS Coronavirus 2 by RT PCR: POSITIVE — AB

## 2021-03-11 LAB — ACETAMINOPHEN LEVEL: Acetaminophen (Tylenol), Serum: 13 ug/mL (ref 10–30)

## 2021-03-11 LAB — ETHANOL: Alcohol, Ethyl (B): <10 mg/dL (ref <10)

## 2021-03-11 LAB — SALICYLATE LEVEL: Salicylate Lvl: <7 mg/dL — ABNORMAL LOW (ref 7.0–30.0)

## 2021-03-11 MED ORDER — STERILE WATER FOR INJECTION IJ SOLN
INTRAMUSCULAR | Status: AC
  Filled 2021-03-11: qty 10

## 2021-03-11 MED ORDER — ONDANSETRON HCL 4 MG PO TABS
4.0000 mg | ORAL_TABLET | Freq: Three times a day (TID) | ORAL | Status: DC | PRN
  Administered 2021-03-13: 4 mg via ORAL
  Filled 2021-03-11: qty 1

## 2021-03-11 MED ORDER — CALCIUM POLYCARBOPHIL 625 MG PO TABS
625.0000 mg | ORAL_TABLET | Freq: Every day | ORAL | Status: DC
  Administered 2021-03-12 – 2021-03-13 (×2): 625 mg via ORAL
  Filled 2021-03-11 (×2): qty 1

## 2021-03-11 MED ORDER — LOSARTAN POTASSIUM 25 MG PO TABS: 100.0000 mg | ORAL_TABLET | Freq: Every morning | ORAL | Status: DC

## 2021-03-11 MED ORDER — METFORMIN HCL 500 MG PO TABS
1000.0000 mg | ORAL_TABLET | Freq: Two times a day (BID) | ORAL | Status: DC
  Administered 2021-03-12 – 2021-03-13 (×3): 1000 mg via ORAL
  Filled 2021-03-11 (×3): qty 2

## 2021-03-11 MED ORDER — DILTIAZEM HCL ER COATED BEADS 120 MG PO CP24
120.0000 mg | ORAL_CAPSULE | Freq: Every evening | ORAL | Status: DC
  Administered 2021-03-11 – 2021-03-12 (×2): 120 mg via ORAL
  Filled 2021-03-11 (×2): qty 1

## 2021-03-11 MED ORDER — LOSARTAN POTASSIUM 25 MG PO TABS
100.0000 mg | ORAL_TABLET | Freq: Every day | ORAL | Status: DC
  Administered 2021-03-12 – 2021-03-13 (×2): 100 mg via ORAL
  Filled 2021-03-11 (×2): qty 4

## 2021-03-11 MED ORDER — DOCUSATE SODIUM 100 MG PO CAPS
100.0000 mg | ORAL_CAPSULE | Freq: Every day | ORAL | Status: DC | PRN
  Administered 2021-03-11: 100 mg via ORAL
  Filled 2021-03-11: qty 1

## 2021-03-11 MED ORDER — POTASSIUM CHLORIDE CRYS ER 20 MEQ PO TBCR
40.0000 meq | EXTENDED_RELEASE_TABLET | Freq: Once | ORAL | Status: AC
  Administered 2021-03-11: 40 meq via ORAL
  Filled 2021-03-11: qty 2

## 2021-03-11 MED ORDER — GABAPENTIN 400 MG PO CAPS
1200.0000 mg | ORAL_CAPSULE | Freq: Two times a day (BID) | ORAL | Status: DC
  Administered 2021-03-11 – 2021-03-13 (×2): 1200 mg via ORAL
  Filled 2021-03-11 (×3): qty 12

## 2021-03-11 MED ORDER — HYDROCHLOROTHIAZIDE 25 MG PO TABS
25.0000 mg | ORAL_TABLET | Freq: Every morning | ORAL | Status: DC
  Administered 2021-03-12 – 2021-03-13 (×2): 25 mg via ORAL
  Filled 2021-03-11 (×2): qty 1

## 2021-03-11 MED ORDER — ALUM & MAG HYDROXIDE-SIMETH 200-200-20 MG/5ML PO SUSP: 30.0000 mL | Freq: Four times a day (QID) | ORAL | Status: DC | PRN

## 2021-03-11 MED ORDER — ZIPRASIDONE MESYLATE 20 MG IM SOLR
20.0000 mg | Freq: Once | INTRAMUSCULAR | Status: AC
  Administered 2021-03-11: 20 mg via INTRAMUSCULAR
  Filled 2021-03-11: qty 20

## 2021-03-11 MED ORDER — PANTOPRAZOLE SODIUM 20 MG PO TBEC
20.0000 mg | DELAYED_RELEASE_TABLET | Freq: Every evening | ORAL | Status: DC
  Administered 2021-03-11 – 2021-03-12 (×2): 20 mg via ORAL
  Filled 2021-03-11 (×2): qty 1

## 2021-03-11 MED ORDER — CEPHALEXIN 500 MG PO CAPS
500.0000 mg | ORAL_CAPSULE | Freq: Four times a day (QID) | ORAL | Status: DC
  Administered 2021-03-12 – 2021-03-13 (×6): 500 mg via ORAL
  Filled 2021-03-11 (×7): qty 1

## 2021-03-11 MED ORDER — LORAZEPAM 2 MG/ML IJ SOLN
2.0000 mg | Freq: Once | INTRAMUSCULAR | Status: DC
  Filled 2021-03-11: qty 1

## 2021-03-11 MED ORDER — CEPHALEXIN 500 MG PO CAPS
500.0000 mg | ORAL_CAPSULE | Freq: Once | ORAL | Status: AC
  Administered 2021-03-11: 500 mg via ORAL
  Filled 2021-03-11: qty 1

## 2021-03-11 MED ORDER — FIBER ADULT GUMMIES 2 G PO CHEW: 1.0000 | CHEWABLE_TABLET | Freq: Every day | ORAL | Status: DC

## 2021-03-11 MED ORDER — ADULT MULTIVITAMIN W/MINERALS CH
1.0000 | ORAL_TABLET | Freq: Once | ORAL | Status: AC
  Administered 2021-03-11: 1 via ORAL
  Filled 2021-03-11: qty 1

## 2021-03-11 MED ORDER — LORAZEPAM 2 MG/ML IJ SOLN
1.0000 mg | Freq: Once | INTRAMUSCULAR | Status: AC
  Administered 2021-03-11: 1 mg via INTRAVENOUS

## 2021-03-11 NOTE — ED Triage Notes (Signed)
Pt BIB EMS from home. Pt called EMS due to wanting to end her life because of her neuropathy pain. Pt wrote a suicide note. Pt took several expired oxycodone at 9pm last night. Pt reports N/V this morning and denies taking anything today.   2.5 mg Haldol IV  4 mg Zofran IV 50 mcg Fentanyl IV 300 mL NS 22G LH

## 2021-03-11 NOTE — ED Notes (Signed)
Pt provided verbal consent to provide updates to son, Edyth Glomb.  [phone].

## 2021-03-11 NOTE — ED Provider Notes (Addendum)
[facility] DEPT Provider Note   CSN: [id number] Arrival date & time: 03/11/21  1317     History Chief Complaint  Patient presents with  . Suicidal    Marilyn Myers is a 70 y.o. female.  Pt presents to the ED an an overdose of oxycodone last night in an attempt to kill herself.  The pt said she wanted to end her life because she is in chronic pain.  She was disappointed when she woke up and wanted to take more, but called the police instead.  EMS gave pt 2.5 mg haldol, 4 mg zofran, and 50 mcg fentanyl en route.  Pt said her pain is a 10/10.        Past Medical History:  Diagnosis Date  . Anxiety   . Diverticulitis of intestine without perforation or abscess   . Diverticulosis, sigmoid   . GERD (gastroesophageal reflux disease)   . History of diverticulitis of colon    09/ 2015  RESOLVED   . History of kidney stones   . Hypertension   . Nephrolithiasis    BILATERAL  . OSA on CPAP    no longer uses   . Right ureteral stone   . Seasonal allergic rhinitis 01/24/2018  . Type 2 diabetes mellitus ([place])    type 2     Patient Active Problem List   Diagnosis Date Noted  . Gastroesophageal reflux disease without esophagitis 01/07/2020  . Chronic narcotic use 01/07/2020  . Obesity (BMI 30-39.9) 02/22/2019  . Refusal of statin medication by patient 01/24/2018  . Seasonal allergic rhinitis 01/24/2018  . PVC's (premature ventricular contractions) 10/08/2016  . Diabetic peripheral neuropathy ([place]) 09/28/2016  . S/P colostomy takedown 07/02/2015  . History of osteopenia 05/19/2015  . Essential hypertension 01/28/2015  . Diverticulitis of large intestine 08/19/2014  . Hydradenitis 08/19/2014  . Nephrolithiasis 08/19/2014  . Obstructive sleep apnea 08/01/2014  . Controlled type 2 diabetes mellitus with diabetic polyneuropathy, without long-term current use of insulin ([place]) 06/10/2014    Past Surgical History:  Procedure Laterality Date  .  BARIATRIC SURGERY    . CERVICAL CONE BIOPSY  1976  . COLON RESECTION     colostomy x 6 months  . CYSTO/  BILATERAL RETROGRADE PYELOGRAM/  PLACEMENT BILATERAL URETERAL STENTS  07-10-2011  . CYSTOSCOPY WITH RETROGRADE PYELOGRAM, URETEROSCOPY AND STENT PLACEMENT Right 10/11/2014   Procedure: CYSTOSCOPY WITH RETROGRADE PYELOGRAM, URETEROSCOPY, right ureteral biopsy, AND STENT PLACEMENT;  Surgeon: Arvil Persons, MD;  Location: [facility];  Service: Urology;  Laterality: Right;  . CYSTOSCOPY/RETROGRADE/URETEROSCOPY  10/08/2011   Procedure: CYSTOSCOPY/RETROGRADE/URETEROSCOPY;  Surgeon: Hanley Ben, MD;  Location: [facility];  Service: Urology;  Laterality: N/A;  CYSTOSCOPY, RIGHT  RETROGRADE, RIGHT URETEROSCOPY HOLMIUM LASER WITH BASKET STONE EXTRACTION AND RIGHT URETERAL STENT PLACEMENT  . CYSTOSCOPY/URETEROSCOPY/HOLMIUM LASER/STENT PLACEMENT Right 02/20/2021   Procedure: CYSTOSCOPY RETROGRADE/URETEROSCOPY/HOLMIUM LASER/STENT PLACEMENT/BASKET STONE EXTRACTION;  Surgeon: Janith Lima, MD;  Location: [facility];  Service: Urology;  Laterality: Right;  ONLY NEEDS 30 MIN  . EXTRACORPOREAL SHOCK WAVE LITHOTRIPSY  x3  last one 01-24-2012  . TUBAL LIGATION  1987  . URETEROSCPIC STONE EXTRACTION/ STENT PLACEMENT  left 08-20-2011     OB History    Gravida  3   Para  3   Term      Preterm      AB      Living  3     SAB      IAB  Ectopic      Multiple      Live Births              Family History  Problem Relation Age of Onset  . Breast cancer Mother        79  . Renal Disease Mother   . Heart disease Mother   . Diabetes Paternal Grandmother   . Diabetes Maternal Aunt     Social History   Tobacco Use  . Smoking status: Former Smoker    Packs/day: 1.00    Years: 25.00    Pack years: 25.00    Types: Cigarettes    Quit date: 10/07/1996    Years since quitting: 24.4  . Smokeless tobacco: Never Used  Vaping Use  . Vaping Use: Never used   Substance Use Topics  . Alcohol use: Never  . Drug use: No    Home Medications Prior to Admission medications   Medication Sig Start Date End Date Taking? Authorizing Provider  Ascorbic Acid (VITAMIN C PO) Take 1 tablet by mouth at bedtime.   Yes [provider]  BIOTIN PO Take 1 tablet by mouth at bedtime.   Yes [provider]  Calcium Carbonate-Vitamin D (CALCIUM-VITAMIN D3 PO) Take 1 tablet by mouth at bedtime.   Yes [provider]  Cyanocobalamin (VITAMIN B 12 PO) Take 1 tablet by mouth at bedtime.   Yes [provider]  diltiazem (CARDIZEM CD) 120 MG 24 hr capsule Take 1 capsule (120 mg total) by mouth daily. Patient taking differently: Take 120 mg by mouth every evening. 01/16/21  Yes [name], MD  docusate sodium (COLACE) 100 MG capsule Take 1 capsule (100 mg total) by mouth daily as needed. Patient taking differently: Take 100 mg by mouth daily as needed for mild constipation. 02/20/21 03/22/21 Yes Janith Lima, MD  FIBER ADULT GUMMIES PO Take 1 tablet by mouth at bedtime.   Yes [provider]  gabapentin (NEURONTIN) 600 MG tablet Take 1 tablet (600 mg total) by mouth 2 (two) times daily. Patient taking differently: Take 1,200 mg by mouth 2 (two) times daily. 01/16/21  Yes [name], MD  hydrochlorothiazide (HYDRODIURIL) 25 MG tablet Take 1 tablet (25 mg total) by mouth daily. Patient taking differently: Take 25 mg by mouth every morning. 01/16/21  Yes [name], MD  HYDROcodone-acetaminophen [place]) 10-325 MG tablet Take 1 tablet by mouth 4 (four) times daily. 07/31/19  Yes [provider]  losartan (COZAAR) 100 MG tablet Take 1 tablet (100 mg total) by mouth daily. Patient taking differently: Take 100 mg by mouth every morning. 01/16/21  Yes [name], MD  metFORMIN (GLUCOPHAGE) 1000 MG tablet Take 1 tablet (1,000 mg total) by mouth 2 (two) times daily. Patient taking differently: Take 1,000 mg by mouth 2  (two) times daily with a meal. 01/16/21  Yes [name], MD  Multiple Vitamins-Minerals (ADULT ONE DAILY GUMMIES) CHEW Chew 1 tablet by mouth at bedtime.   Yes [provider]  pantoprazole (PROTONIX) 20 MG tablet TAKE 1 TO 2 TABLETS (20-40 MG TOTAL) BY MOUTH DAILY. Patient taking differently: Take 20 mg by mouth every evening. 10/29/20  Yes [name], MD  diazepam (VALIUM) 5 MG tablet Take 1 tablet (5 mg total) by mouth every 8 (eight) hours as needed for muscle spasms. Patient not taking: No sig reported 01/25/21   Daleen Bo, MD    Allergies    Ace inhibitors, Codeine,  and Flomax [tamsulosin hcl]  Review of Systems   Review of Systems  Psychiatric/Behavioral: Positive for suicidal ideas.  All other systems reviewed and are negative.   Physical Exam Updated Vital Signs BP 135/67   Pulse 90   Temp 97.9 F (36.6 C) (Oral)   Resp 18   SpO2 100%   Physical Exam Vitals and nursing note reviewed.  Constitutional:      Appearance: Normal appearance.  HENT:     Head: Normocephalic and atraumatic.     Right Ear: External ear normal.     Left Ear: External ear normal.     Nose: Nose normal.     Mouth/Throat:     Mouth: Mucous membranes are moist.     Pharynx: Oropharynx is clear.  Eyes:     Extraocular Movements: Extraocular movements intact.     Conjunctiva/sclera: Conjunctivae normal.     Pupils: Pupils are equal, round, and reactive to light.  Cardiovascular:     Rate and Rhythm: Normal rate and regular rhythm.     Pulses: Normal pulses.     Heart sounds: Normal heart sounds.  Pulmonary:     Effort: Pulmonary effort is normal.     Breath sounds: Normal breath sounds.  Abdominal:     General: Abdomen is flat. Bowel sounds are normal.     Palpations: Abdomen is soft.  Musculoskeletal:        General: Normal range of motion.     Cervical back: Normal range of motion and neck supple.  Skin:    General: Skin is warm.     Capillary Refill: Capillary  refill takes less than 2 seconds.  Neurological:     General: No focal deficit present.     Mental Status: She is alert and oriented to person, place, and time.  Psychiatric:        Thought Content: Thought content includes suicidal ideation. Thought content includes suicidal plan.     ED Results / Procedures / Treatments   Labs (all labs ordered are listed, but only abnormal results are displayed) Labs Reviewed  RESP PANEL BY RT-PCR (FLU A&B, COVID) ARPGX2 - Abnormal; Notable for the following components:      Result Value   SARS Coronavirus 2 by RT PCR POSITIVE (*)    All other components within normal limits  COMPREHENSIVE METABOLIC PANEL - Abnormal; Notable for the following components:   Potassium 3.0 (*)    CO2 20 (*)    Glucose, Bld 149 (*)    BUN 25 (*)    Creatinine, Ser 1.01 (*)    Total Protein 8.2 (*)    Total Bilirubin 1.3 (*)    GFR, Estimated 60 (*)    Anion gap 18 (*)    All other components within normal limits  RAPID URINE DRUG SCREEN, HOSP PERFORMED - Abnormal; Notable for the following components:   Opiates POSITIVE (*)    All other components within normal limits  CBC WITH DIFFERENTIAL/PLATELET - Abnormal; Notable for the following components:   WBC 12.3 (*)    Hemoglobin 11.8 (*)    Neutro Abs 9.3 (*)    Monocytes Absolute 1.2 (*)    All other components within normal limits  SALICYLATE LEVEL - Abnormal; Notable for the following components:   Salicylate Lvl <1.2 (*)    All other components within normal limits  URINALYSIS, ROUTINE W REFLEX MICROSCOPIC - Abnormal; Notable for the following components:   APPearance CLOUDY (*)    Ketones,  ur 20 (*)    Protein, ur 100 (*)    Nitrite POSITIVE (*)    Leukocytes,Ua LARGE (*)    WBC, UA >50 (*)    Bacteria, UA MANY (*)    Non Squamous Epithelial 0-5 (*)    All other components within normal limits  ETHANOL  ACETAMINOPHEN LEVEL    EKG EKG Interpretation  Date/Time:  Wednesday March 11 2021  14:57:12 EDT Ventricular Rate:  101 PR Interval:  168 QRS Duration: 74 QT Interval:  300 QTC Calculation: 389 R Axis:   91 Text Interpretation: Sinus tachycardia Rightward axis Low voltage QRS Nonspecific T wave abnormality Abnormal ECG No significant change since last tracing Confirmed by Isla Pence [phone]) on 03/11/2021 3:24:30 PM   Radiology DG Chest 1 View  Result Date: 03/11/2021 CLINICAL DATA:  COVID EXAM: CHEST  1 VIEW COMPARISON:  10/22/2014 FINDINGS: The heart size and mediastinal contours are within normal limits. Both lungs are clear. The visualized skeletal structures are unremarkable. IMPRESSION: No active disease. Electronically Signed   By: Donavan Foil M.D.   On: 03/11/2021 22:07    Procedures Procedures   Medications Ordered in ED Medications  ondansetron (ZOFRAN) tablet 4 mg (has no administration in time range)  alum & mag hydroxide-simeth (MAALOX/MYLANTA) 200-200-20 MG/5ML suspension 30 mL (has no administration in time range)  diltiazem (CARDIZEM CD) 24 hr capsule 120 mg (120 mg Oral Given 03/11/21 2039)  docusate sodium (COLACE) capsule 100 mg (100 mg Oral Given 03/11/21 2031)  gabapentin (NEURONTIN) capsule 1,200 mg (1,200 mg Oral Given 03/11/21 2029)  hydrochlorothiazide (HYDRODIURIL) tablet 25 mg (has no administration in time range)  metFORMIN (GLUCOPHAGE) tablet 1,000 mg (has no administration in time range)  pantoprazole (PROTONIX) EC tablet 20 mg (20 mg Oral Given 03/11/21 2039)  cephALEXin (KEFLEX) capsule 500 mg (has no administration in time range)  losartan (COZAAR) tablet 100 mg (has no administration in time range)  polycarbophil (FIBERCON) tablet 625 mg (has no administration in time range)  ziprasidone (GEODON) injection 20 mg (20 mg Intramuscular Given 03/11/21 1418)  sterile water (preservative free) injection (  Given 03/11/21 1420)  cephALEXin (KEFLEX) capsule 500 mg (500 mg Oral Given 03/11/21 1618)  LORazepam (ATIVAN) injection 1 mg (1 mg  Intravenous Given 03/11/21 1619)  potassium chloride SA (KLOR-CON) CR tablet 40 mEq (40 mEq Oral Given 03/11/21 2030)  multivitamin with minerals tablet 1 tablet (1 tablet Oral Given 03/11/21 2030)    ED Course  I have reviewed the triage vital signs and the nursing notes.  Pertinent labs & imaging results that were available during my care of the patient were reviewed by me and considered in my medical decision making (see chart for details).    MDM Rules/Calculators/A&P                          Pt became very agitated and tried to leave.  IVC done.  Pt given 20 mg of geodon and 2 mg of ativan and is still awake, but is more calm.   Pt's K is low and that is replaced.    Pt does have a UTI and is started on keflex.  She will need a week of keflex.  Tylenol level is negative.  Pt said she took oxycodone, nothing with tylenol.  Pt is medically cleared for TTS consult.  Home meds and diet ordered.  Pt's Covid test is +.  Pt's is not hypoxic and CXR is  negative.    JEANNINE PENNISI was evaluated in Emergency Department on 03/11/2021 for the symptoms described in the history of present illness. She was evaluated in the context of the global COVID-19 pandemic, which necessitated consideration that the patient might be at risk for infection with the SARS-CoV-2 virus that causes COVID-19. Institutional protocols and algorithms that pertain to the evaluation of patients at risk for COVID-19 are in a state of rapid change based on information released by regulatory bodies including the CDC and federal and state organizations. These policies and algorithms were followed during the patient's care in the ED.  Final Clinical Impression(s) / ED Diagnoses Final diagnoses:  Suicidal ideation  Acute cystitis without hematuria  Hypokalemia  COVID-19    Rx / DC Orders ED Discharge Orders    None       Isla Pence, MD 03/11/21 [place]    Isla Pence, MD 03/11/21 2213

## 2021-03-11 NOTE — BH Assessment (Signed)
Comprehensive Clinical Assessment (CCA) Note  03/11/2021 Marilyn Myers [id number]  Chief Complaint:  Chief Complaint  Patient presents with  . Suicidal   Visit Diagnosis:  Depression, unspecified Suicidal ideation Suicide attempt  Disposition: Per Lindon Romp, NP pt meets inpatient geropsych criteria. Pts RN Lenoria Farrier and Cherlynn Perches notified of disposition via [employer].   [place] ED from 03/11/2021 in [place] [facility]PT ED from 01/25/2021 in [place] [facility]PT ED from 01/22/2021 in [place] [facility]PT  C-SSRS RISK CATEGORY High Risk No Risk No Risk     The patient demonstrates the following risk factors for suicide: Chronic risk factors for suicide include: psychiatric disorder of depression, medical illness diabetes/neuropathy, chronic pain and completed suicide in a family member. Acute risk factors for suicide include: loss (financial, interpersonal, professional). Protective factors for this patient include: n/a. Considering these factors, the overall suicide risk at this point appears to be high. Patient is not appropriate for outpatient follow up.   Marilyn Myers is a 70 yo female presenting to [facility]--transported via EMS after an intentional overdose of hydrocodone. Pt states that she attempted to take her life because she is tired of being in chronic pain all of the time and pt feels like her doctors are not helpful. Pt admits that she planned the timing of it all while family members were out of town and that she would not be found in her townhome. "I wrote a letter, too".  Pt reports that she is not receiving any psychiatric treatment at time of assessment. Pt denies HI, AVH, or any substance use.  "I am in so much pain, I just want to end it all".  Pt continued to endorse suicidal ideation throughout assessment. Pt continues to be a danger to herself.  Marilyn Myers, MSW, LCSW Outpatient  Therapist/Triage Specialist   CCA Screening, Triage and Referral (STR)  Patient Reported Information How did you hear about [place]? No data recorded Referral name: EMS transported [facility] to [facility]  Referral phone number: No data recorded  Whom do you see for routine medical problems? No data recorded Practice/Facility Name: No data recorded Practice/Facility Phone Number: No data recorded Name of Contact: No data recorded Contact Number: No data recorded Contact Fax Number: No data recorded Prescriber Name: No data recorded Prescriber Address (if known): No data recorded  What Is the Reason for Your Visit/Call Today? No data recorded How Long Has This Been Causing You Problems? 1 wk - 1 month  What Do You Feel Would Help You the Most Today? Treatment for Depression or other mood problem   Have You Recently Been in Any Inpatient Treatment (Hospital/Detox/Crisis Center/28-Day Program)? No data recorded Name/Location of Program/Hospital:No data recorded How Long Were You There? No data recorded When Were You Discharged? No data recorded  Have You Ever Received Services From [facility] Before? Yes  Who Do You See at [facility]? No data recorded  Have You Recently Had Any Thoughts About Hurting Yourself? Yes  Are You Planning to Commit Suicide/Harm Yourself At This time? Yes   Have you Recently Had Thoughts About Hurting Someone Marilyn Myers? No  Explanation: No data recorded  Have You Used Any Alcohol or Drugs in the Past 24 Hours? No  How Long Ago Did You Use Drugs or Alcohol? No data recorded What Did You Use and How Much? No data recorded  Do You Currently Have a Therapist/Psychiatrist? No  Name of Therapist/Psychiatrist: No data recorded  Have You Been Recently Discharged From Any Office Practice or Programs? No  Explanation of Discharge From Practice/Program: No data recorded    CCA Screening Triage Referral Assessment Type of Contact: Tele-Assessment  Is this Initial or  Reassessment? Initial Assessment  Date Telepsych consult ordered in [facility]:  03/11/2021  Time Telepsych consult ordered in [facility]:  1317   Patient Reported Information Reviewed? Yes  Patient Left Without Being Seen? No  Reason for Not Completing Assessment: No data recorded  Collateral Involvement: none   Does Patient Have a Court Appointed Legal Guardian? No data recorded Name and Contact of Legal Guardian: No data recorded If Minor and Not Living with Parent(s), Who has Custody? No data recorded Is CPS involved or ever been involved? Never  Is APS involved or ever been involved? Never   Patient Determined To Be At Risk for Harm To Self or Others Based on Review of Patient Reported Information or Presenting Complaint? Yes, for Self-Harm  Method: No data recorded Availability of Means: No data recorded Intent: No data recorded Notification Required: No data recorded Additional Information for Danger to Others Potential: No data recorded Additional Comments for Danger to Others Potential: No data recorded Are There Guns or Other Weapons in Your Home? No data recorded Types of Guns/Weapons: No data recorded Are These Weapons Safely Secured?                            No data recorded Who Could Verify You Are Able To Have These Secured: No data recorded Do You Have any Outstanding Charges, Pending Court Dates, Parole/Probation? No data recorded Contacted To Inform of Risk of Harm To Self or Others: No data recorded  Location of Assessment: [facility]   Does Patient Present under Involuntary Commitment? No  IVC Papers Initial File Date: No data recorded  [place] of Residence: [place]   Patient Currently Receiving the Following Services: Not Receiving Services   Determination of Need: No data recorded  Options For Referral: Inpatient Hospitalization; Geropsychiatric Facility     CCA Biopsychosocial Intake/Chief Complaint:  Marilyn Myers is a 70 yo female presenting to  [facility]--transported via EMS after an intentional overdose of hydrocodone. Pt states that she attempted to take her life because she is tired of being in chronic pain all of the time and pt feels like her doctors are not helpful. Pt admits that she planned the timing of it all while family members were out of town and that she would not be found in her townhome. "I wrote a letter, too".  Pt reports that she is not receiving any psychiatric treatment at time of assessment. Pt denies HI, AVH, or any substance use.  "I am in so much pain, I just want to end it all".  Pt continued to endorse suicidal ideation throughout assessment. Pt continues to be a danger to herself.  Current Symptoms/Problems: chronic pain   Patient Reported Schizophrenia/Schizoaffective Diagnosis in Past: No   Strengths: good self awareness  Preferences: "I just want to go home so I can take the right amount of pain medicine"  Abilities: No data recorded  Type of Services Patient Feels are Needed: pt does not want any services/help   Initial Clinical Notes/Concerns: No data recorded  Mental Health Symptoms Depression:  Worthlessness; Hopelessness   Duration of Depressive symptoms: Greater than two weeks   Mania:  None   Anxiety:   Worrying   Psychosis:  None  Duration of Psychotic symptoms: No data recorded  Trauma:  None   Obsessions:  None   Compulsions:  None   Inattention:  None   Hyperactivity/Impulsivity:  N/A   Oppositional/Defiant Behaviors:  None   Emotional Irregularity:  None   Other Mood/Personality Symptoms:  No data recorded   Mental Status Exam Appearance and self-care  Stature:  Average   Weight:  Overweight   Clothing:  Neat/clean   Grooming:  Normal   Cosmetic use:  None   Posture/gait:  Normal   Motor activity:  Not Remarkable   Sensorium  Attention:  Normal   Concentration:  Normal   Orientation:  X5   Recall/memory:  Normal   Affect and Mood  Affect:   Appropriate   Mood:  Hopeless   Relating  Eye contact:  Normal   Facial expression:  Responsive   Attitude toward examiner:  Cooperative   Thought and Language  Speech flow: Clear and Coherent   Thought content:  Appropriate to Mood and Circumstances   Preoccupation:  Suicide   Hallucinations:  None   Organization:  No data recorded  Transport planner of Knowledge:  Good   Intelligence:  Average   Abstraction:  Normal   Judgement:  Dangerous   Reality Testing:  Variable   Insight:  Gaps   Decision Making:  Impulsive   Social Functioning  Social Maturity:  Impulsive   Social Judgement:  Heedless   Stress  Stressors:  Illness   Coping Ability:  Deficient supports   Skill Deficits:  Self-control   Supports:  No data recorded    Religion:    Leisure/Recreation: Leisure / Recreation Do You Have Hobbies?: No  Exercise/Diet: Exercise/Diet Do You Exercise?: Yes Have You Gained or Lost A Significant Amount of Weight in the Past Six Months?: No Do You Follow a Special Diet?: No Do You Have Any Trouble Sleeping?: No   CCA Employment/Education Employment/Work Situation: Employment / Work Situation Employment situation: Retired Has patient ever been in the [employer]?: No  Education: Education Did Teacher, adult education From [employer]?: Yes   CCA Family/Childhood History Family and Relationship History: Family history Marital status: Widowed Widowed, when?: husband passed away two years ago Does patient have children?: Yes How is patient's relationship with their children?: 4 stepchildren and 3 biological children  Childhood History:  Childhood History By whom was/is the patient raised?: Both parents Additional childhood history information: stable Description of patient's relationship with caregiver when they were a child: stable relationships Does patient have siblings?: Yes Number of Siblings: 1 Description of patient's current  relationship with siblings: brother--pt is close with him Did patient suffer any verbal/emotional/physical/sexual abuse as a child?: No Did patient suffer from severe childhood neglect?: No Has patient ever been sexually abused/assaulted/raped as an adolescent or adult?: No Was the patient ever a victim of a crime or a disaster?: No Witnessed domestic violence?: No Has patient been affected by domestic violence as an adult?: No  Child/Adolescent Assessment:     CCA Substance Use Alcohol/Drug Use: Alcohol / Drug Use Pain Medications: see MaR Prescriptions: see MAR Over the Counter: see MAR History of alcohol / drug use?: No history of alcohol / drug abuse      ASAM's:  Six Dimensions of Multidimensional Assessment  Dimension 1:  Acute Intoxication and/or Withdrawal Potential:      Dimension 2:  Biomedical Conditions and Complications:      Dimension 3:  Emotional, Behavioral, or Cognitive Conditions and  Complications:     Dimension 4:  Readiness to Change:     Dimension 5:  Relapse, Continued use, or Continued Problem Potential:     Dimension 6:  Recovery/Living Environment:     ASAM Severity Score:    ASAM Recommended Level of Treatment:     Substance use Disorder (SUD)    Recommendations for Services/Supports/Treatments: Recommendations for Services/Supports/Treatments Recommendations For Services/Supports/Treatments: Other (Comment) (geropsych inpatient)  DSM5 Diagnoses: Patient Active Problem List   Diagnosis Date Noted  . Gastroesophageal reflux disease without esophagitis 01/07/2020  . Chronic narcotic use 01/07/2020  . Obesity (BMI 30-39.9) 02/22/2019  . Refusal of statin medication by patient 01/24/2018  . Seasonal allergic rhinitis 01/24/2018  . PVC's (premature ventricular contractions) 10/08/2016  . Diabetic peripheral neuropathy ([place]) 09/28/2016  . S/P colostomy takedown 07/02/2015  . History of osteopenia 05/19/2015  . Essential hypertension  01/28/2015  . Diverticulitis of large intestine 08/19/2014  . Hydradenitis 08/19/2014  . Nephrolithiasis 08/19/2014  . Obstructive sleep apnea 08/01/2014  . Controlled type 2 diabetes mellitus with diabetic polyneuropathy, without long-term current use of insulin ([place]) 06/10/2014    Referrals to Alternative Service(s): Referred to Alternative Service(s):   Place:   Date:   Time:    Referred to Alternative Service(s):   Place:   Date:   Time:    Referred to Alternative Service(s):   Place:   Date:   Time:    Referred to Alternative Service(s):   Place:   Date:   Time:     Rachel Bo Sakiya Stepka, LCSW

## 2021-03-11 NOTE — ED Notes (Addendum)
Pt getting out of bed and yelling at staff about her pain. RN told the pt to sit back in the bed because she was worried she was going to fall. Pt sat back down and continues to moan out in pain. MD made aware.

## 2021-03-11 NOTE — BH Assessment (Signed)
Clinician attempted to contact pt's nurse to see if the pt is able to engage in TTS assessment, clinician noted from [facility], NT/NS pt's nurse is in another pt's room. Clinician to check back.    Vertell Novak, [place], [facility], [facility] Triage Specialist [phone]

## 2021-03-12 DIAGNOSIS — U071 COVID-19: Secondary | ICD-10-CM | POA: Diagnosis not present

## 2021-03-12 DIAGNOSIS — T402X1A Poisoning by other opioids, accidental (unintentional), initial encounter: Secondary | ICD-10-CM | POA: Diagnosis not present

## 2021-03-12 DIAGNOSIS — E876 Hypokalemia: Secondary | ICD-10-CM | POA: Diagnosis not present

## 2021-03-12 DIAGNOSIS — R45851 Suicidal ideations: Secondary | ICD-10-CM | POA: Diagnosis not present

## 2021-03-12 DIAGNOSIS — N3000 Acute cystitis without hematuria: Secondary | ICD-10-CM | POA: Diagnosis not present

## 2021-03-12 MED ORDER — ACETAMINOPHEN 500 MG PO TABS
1000.0000 mg | ORAL_TABLET | Freq: Once | ORAL | Status: AC
  Administered 2021-03-12: 1000 mg via ORAL
  Filled 2021-03-12: qty 2

## 2021-03-12 MED ORDER — DULOXETINE HCL 20 MG PO CPEP
20.0000 mg | ORAL_CAPSULE | Freq: Two times a day (BID) | ORAL | Status: DC
  Administered 2021-03-13 (×2): 20 mg via ORAL
  Filled 2021-03-12 (×2): qty 1

## 2021-03-12 MED ORDER — ACETAMINOPHEN 325 MG PO TABS
650.0000 mg | ORAL_TABLET | Freq: Four times a day (QID) | ORAL | Status: DC | PRN
  Administered 2021-03-12 – 2021-03-13 (×4): 650 mg via ORAL
  Filled 2021-03-12 (×4): qty 2

## 2021-03-12 NOTE — ED Provider Notes (Signed)
Emergency Medicine Observation Re-evaluation Note  Marilyn Myers is a 70 y.o. female, seen on rounds today.  Pt initially presented to the ED for complaints of Suicidal Currently, the patient is awaiting placement.  Physical Exam  BP 106/73 (BP Location: Right Arm)   Pulse 96   Temp 98.2 F (36.8 C) (Oral)   Resp 18   Ht 1.689 m (5' 6.5")   Wt 88.5 kg   SpO2 100%   BMI 31.00 kg/m  Physical Exam General: Sitting in bed.  Psych: Cooperative  ED Course / MDM  EKG:EKG Interpretation  Date/Time:  Wednesday March 11 2021 14:57:12 EDT Ventricular Rate:  101 PR Interval:  168 QRS Duration: 74 QT Interval:  300 QTC Calculation: 389 R Axis:   91 Text Interpretation: Sinus tachycardia Rightward axis Low voltage QRS Nonspecific T wave abnormality Abnormal ECG No significant change since last tracing Confirmed by Isla Pence [phone]) on 03/11/2021 3:24:30 PM   I have reviewed the labs performed to date as well as medications administered while in observation.  Recent changes in the last 24 hours include none.  Plan  Current plan is for placement per psychiatry service.    Lacretia Leigh, MD 03/12/21 [phone]

## 2021-03-12 NOTE — Care Management (Signed)
Writer informed the patient son of the disposition.   Writer referred patient to the following facilities:  [facility] Details  Fax          [address], [place] [address]     Internal comment    [facility] Details  Fax        [place], [place] [address]     Internal comment    [facility] Details  Fax        [place], [place] [phone]     Internal comment    [facility] Details  Fax        [address] [place], [place] [address]     Internal comment    [facility] Details  Fax        [address] [place] [address]     Internal comment    [facility] Details  Fax        [address], [place] [address]     Internal comment    [facility] Details  Fax        [address] [place] [address]     Internal comment    [facility] Details  Fax        [address], [place] [address]     Internal comment

## 2021-03-12 NOTE — Progress Notes (Signed)
Please update son daily on patients condition.  Marilyn Myers [phone]

## 2021-03-12 NOTE — ED Notes (Signed)
Family updated as to patient's status.

## 2021-03-12 NOTE — Consult Note (Signed)
Telepsych Consultation   Reason for Consult:  Psychiatric reassessment for suicidal ideations  Referring Physician:  Dr. Johnney Killian  Location of Patient: [facility] Location of Provider: [facility]  Patient Identification: Marilyn Myers MRN:  [id number] Principal Diagnosis: Suicide attempt [facility]) Diagnosis:  Principal Problem:   Suicide attempt [facility])   Total Time spent with patient: 30 minutes  Subjective:    Marilyn Myers is a 70 y.o. female patient admitted with suicidal ideations.   Marilyn Myers, 70 y.o., female patient seen via tele health by this provider, consulted with Dr. Mauro Kaufmann; and chart reviewed on 03/12/21.  On evaluation Marilyn Myers reports I am in here because, " I could not take the pain any more and I took a handful of oxy".  HPI:    During evaluation Marilyn Myers is sitting in bed eating crackers She is in no acute distress. She is alert, oriented x 4, calm and cooperative. She reports her mood as "good". States, " I don't think I am depressed but I don't know". Her affect is depressed. She becomes teary when talking about her suicide attempt. Reports she does not feel hopeful due to her pain. She does not appear to be responding to internal/external stimuli or delusional thoughts.  Patient denies homicidal ideation, psychosis, and paranoia.  Patient states she doesn't feel suicidal today, but she cant be for certain if she would try suicide again if her pain is still bad. She could not fully contract for safety if she were to be released.   Reports she has 4 step children and 3 biological children that she is close to. Participates in a ladies night with her girlfriends and is active in [place].  Patient is a widow and lives alone.   Reports no psychiatric history. States that her father had a suicide attempt and 2 extended relatives completed suicide.   Collateral: Contacted Hosie Poisson, patients son. He states the family is shocked, because this  out of character for patient. Son states he talked to his sister whom lives in [place]. Reports the patient can live with her for a while and she can be supervised. He would like for her to be "mentally stable" before that can happen.   Past Psychiatric History: denies  Risk to Self:  Patient is not sure.  Risk to Others:  patient denies  Prior Inpatient Therapy:  no  Prior Outpatient Therapy:  no  Past Medical History:  Past Medical History:  Diagnosis Date  . Anxiety   . Diverticulitis of intestine without perforation or abscess   . Diverticulosis, sigmoid   . GERD (gastroesophageal reflux disease)   . History of diverticulitis of colon    09/ 2015  RESOLVED   . History of kidney stones   . Hypertension   . Nephrolithiasis    BILATERAL  . OSA on CPAP    no longer uses   . Right ureteral stone   . Seasonal allergic rhinitis 01/24/2018  . Type 2 diabetes mellitus ([place])    type 2     Past Surgical History:  Procedure Laterality Date  . BARIATRIC SURGERY    . CERVICAL CONE BIOPSY  1976  . COLON RESECTION     colostomy x 6 months  . CYSTO/  BILATERAL RETROGRADE PYELOGRAM/  PLACEMENT BILATERAL URETERAL STENTS  07-10-2011  . CYSTOSCOPY WITH RETROGRADE PYELOGRAM, URETEROSCOPY AND STENT PLACEMENT Right 10/11/2014   Procedure: CYSTOSCOPY WITH RETROGRADE PYELOGRAM, URETEROSCOPY, right ureteral biopsy, AND STENT PLACEMENT;  Surgeon: Arvil Persons, MD;  Location: [facility];  Service: Urology;  Laterality: Right;  . CYSTOSCOPY/RETROGRADE/URETEROSCOPY  10/08/2011   Procedure: CYSTOSCOPY/RETROGRADE/URETEROSCOPY;  Surgeon: Hanley Ben, MD;  Location: [facility];  Service: Urology;  Laterality: N/A;  CYSTOSCOPY, RIGHT  RETROGRADE, RIGHT URETEROSCOPY HOLMIUM LASER WITH BASKET STONE EXTRACTION AND RIGHT URETERAL STENT PLACEMENT  . CYSTOSCOPY/URETEROSCOPY/HOLMIUM LASER/STENT PLACEMENT Right 02/20/2021   Procedure: CYSTOSCOPY RETROGRADE/URETEROSCOPY/HOLMIUM  LASER/STENT PLACEMENT/BASKET STONE EXTRACTION;  Surgeon: Janith Lima, MD;  [facility];  Service: Urology;  Laterality: Right;  ONLY NEEDS 30 MIN  . EXTRACORPOREAL SHOCK WAVE LITHOTRIPSY  x3  last one 01-24-2012  . TUBAL LIGATION  1987  . URETEROSCPIC STONE EXTRACTION/ STENT PLACEMENT  left 08-20-2011   Family History:  Family History  Problem Relation Age of Onset  . Breast cancer Mother        78  . Renal Disease Mother   . Heart disease Mother   . Diabetes Paternal Grandmother   . Diabetes Maternal Aunt    Family Psychiatric  History: reports father and  2 relatives had suicide attempt. Social History:  Social History   Substance and Sexual Activity  Alcohol Use Never     Social History   Substance and Sexual Activity  Drug Use No    Social History   Socioeconomic History  . Marital status: Widowed    Spouse name: Not on file  . Number of children: 3  . Years of education: Not on file  . Highest education level: Not on file  Occupational History  . Occupation: Retired   Tobacco Use  . Smoking status: Former Smoker    Packs/day: 1.00    Years: 25.00    Pack years: 25.00    Types: Cigarettes    Quit date: 10/07/1996    Years since quitting: 24.4  . Smokeless tobacco: Never Used  Vaping Use  . Vaping Use: Never used  Substance and Sexual Activity  . Alcohol use: Never  . Drug use: No  . Sexual activity: Never  Other Topics Concern  . Not on file  Social History Narrative   In process of selling home and moving to [place] with daughter   Social Determinants of Health   Financial Resource Strain: Low Risk   . Difficulty of Paying Living Expenses: Not hard at all  Food Insecurity: No Food Insecurity  . Worried About Charity fundraiser in the Last Year: Never true  . Ran Out of Food in the Last Year: Never true  Transportation Needs: No Transportation Needs  . Lack of Transportation (Medical): No  . Lack of Transportation (Non-Medical): No   Physical Activity: Inactive  . Days of Exercise per Week: 0 days  . Minutes of Exercise per Session: 0 min  Stress: No Stress Concern Present  . Feeling of Stress : Not at all  Social Connections: Moderately Isolated  . Frequency of Communication with Friends and Family: More than three times a week  . Frequency of Social Gatherings with Friends and Family: Once a week  . Attends Religious Services: More than 4 times per year  . Active Member of Clubs or Organizations: No  . Attends Archivist Meetings: Never  . Marital Status: Widowed   Additional Social History:    Allergies:   Allergies  Allergen Reactions  . Ace Inhibitors Cough  . Codeine Nausea And Vomiting  . Flomax [Tamsulosin Hcl] Itching    Labs:  Results for orders  placed or performed during the hospital encounter of 03/11/21 (from the past 48 hour(s))  Urine rapid drug screen (hosp performed)     Status: Abnormal   Collection Time: 03/11/21  2:40 PM  Result Value Ref Range   Opiates POSITIVE (A) NONE DETECTED   Cocaine NONE DETECTED NONE DETECTED   Benzodiazepines NONE DETECTED NONE DETECTED   Amphetamines NONE DETECTED NONE DETECTED   Tetrahydrocannabinol NONE DETECTED NONE DETECTED   Barbiturates NONE DETECTED NONE DETECTED    Comment: (NOTE) DRUG SCREEN FOR MEDICAL PURPOSES ONLY.  IF CONFIRMATION IS NEEDED FOR ANY PURPOSE, NOTIFY LAB WITHIN 5 DAYS.  LOWEST DETECTABLE LIMITS FOR URINE DRUG SCREEN Drug Class                     Cutoff (ng/mL) Amphetamine and metabolites    1000 Barbiturate and metabolites    200 Benzodiazepine                 361 Tricyclics and metabolites     300 Opiates and metabolites        300 Cocaine and metabolites        300 THC                            50 Performed at [facility], [place] [address] [place], [place] [address]   Urinalysis, Routine w reflex microscopic     Status: Abnormal   Collection Time: 03/11/21  2:40 PM  Result Value Ref  Range   Color, Urine YELLOW YELLOW   APPearance CLOUDY (A) CLEAR   Specific Gravity, Urine 1.023 1.005 - 1.030   pH 5.0 5.0 - 8.0   Glucose, UA NEGATIVE NEGATIVE mg/dL   Hgb urine dipstick NEGATIVE NEGATIVE   Bilirubin Urine NEGATIVE NEGATIVE   Ketones, ur 20 (A) NEGATIVE mg/dL   Protein, ur 100 (A) NEGATIVE mg/dL   Nitrite POSITIVE (A) NEGATIVE   Leukocytes,Ua LARGE (A) NEGATIVE   RBC / HPF 11-20 0 - 5 RBC/hpf   WBC, UA >50 (H) 0 - 5 WBC/hpf   Bacteria, UA MANY (A) NONE SEEN   Squamous Epithelial / LPF 0-5 0 - 5   WBC Clumps PRESENT    Mucus PRESENT    Non Squamous Epithelial 0-5 (A) NONE SEEN    Comment: Performed at [facility], [place] [address] [place],  [address]  Comprehensive metabolic panel     Status: Abnormal   Collection Time: 03/11/21  4:45 PM  Result Value Ref Range   Sodium 139 135 - 145 mmol/L   Potassium 3.0 (L) 3.5 - 5.1 mmol/L   Chloride 101 98 - 111 mmol/L   CO2 20 (L) 22 - 32 mmol/L   Glucose, Bld 149 (H) 70 - 99 mg/dL    Comment: Glucose reference range applies only to samples taken after fasting for at least 8 hours.   BUN 25 (H) 8 - 23 mg/dL   Creatinine, Ser 1.01 (H) 0.44 - 1.00 mg/dL   Calcium 9.7 8.9 - 10.3 mg/dL   Total Protein 8.2 (H) 6.5 - 8.1 g/dL   Albumin 3.9 3.5 - 5.0 g/dL   AST 22 15 - 41 U/L   ALT 24 0 - 44 U/L   Alkaline Phosphatase 84 38 - 126 U/L   Total Bilirubin 1.3 (H) 0.3 - 1.2 mg/dL   GFR, Estimated 60 (L) >60 mL/min    Comment: (NOTE) Calculated  using the CKD-EPI Creatinine Equation (2021)    Anion gap 18 (H) 5 - 15    Comment: Performed at [facility], [place] [address], [place], [place] [address]  Ethanol     Status: None   Collection Time: 03/11/21  4:45 PM  Result Value Ref Range   Alcohol, Ethyl (B) <10 <10 mg/dL    Comment: (NOTE) Lowest detectable limit for serum alcohol is 10 mg/dL.  For medical purposes only. Performed at [facility], [place], [place] [address], [place], [place] [address]   CBC with Diff     Status: Abnormal   Collection Time: 03/11/21  4:45 PM  Result Value Ref Range   WBC 12.3 (H) 4.0 - 10.5 K/uL   RBC 4.01 3.87 - 5.11 MIL/uL   Hemoglobin 11.8 (L) 12.0 - 15.0 g/dL   HCT 36.5 36.0 - 46.0 %   MCV 91.0 80.0 - 100.0 fL   MCH 29.4 26.0 - 34.0 pg   MCHC 32.3 30.0 - 36.0 g/dL   RDW 14.5 11.5 - 15.5 %   Platelets 303 150 - 400 K/uL   nRBC 0.0 0.0 - 0.2 %   Neutrophils Relative % 77 %   Neutro Abs 9.3 (H) 1.7 - 7.7 K/uL   Lymphocytes Relative 14 %   Lymphs Abs 1.7 0.7 - 4.0 K/uL   Monocytes Relative 9 %   Monocytes Absolute 1.2 (H) 0.1 - 1.0 K/uL   Eosinophils Relative 0 %   Eosinophils Absolute 0.0 0.0 - 0.5 K/uL   Basophils Relative 0 %   Basophils Absolute 0.0 0.0 - 0.1 K/uL   Immature Granulocytes 0 %   Abs Immature Granulocytes 0.04 0.00 - 0.07 K/uL    Comment: Performed at [facility], [place] [address], [place], [place] [address]  Acetaminophen level     Status: None   Collection Time: 03/11/21  4:45 PM  Result Value Ref Range   Acetaminophen (Tylenol), Serum 13 10 - 30 ug/mL    Comment: (NOTE) Therapeutic concentrations vary significantly. A range of 10-30 ug/mL  may be an effective concentration for many patients. However, some  are best treated at concentrations outside of this range. Acetaminophen concentrations >150 ug/mL at 4 hours after ingestion  and >50 ug/mL at 12 hours after ingestion are often associated with  toxic reactions.  Performed at [facility], [place] [address] [place], [place] [address]   Salicylate level     Status: Abnormal   Collection Time: 03/11/21  4:45 PM  Result Value Ref Range   Salicylate Lvl <6.6 (L) 7.0 - 30.0 mg/dL    Comment: Performed at [facility], [place] [address] [place], [place] [address]  Resp Panel by RT-PCR (Flu A&B, Covid) Nasopharyngeal Swab     Status: Abnormal   Collection Time: 03/11/21  4:50 PM    Specimen: Nasopharyngeal Swab; Nasopharyngeal(NP) swabs in vial transport medium  Result Value Ref Range   SARS Coronavirus 2 by RT PCR POSITIVE (A) NEGATIVE    Comment: RESULT CALLED TO, READ BACK BY AND VERIFIED WITH: SMITH J. 04.20.22 @ 2117 BY MECIAL J. (NOTE) SARS-CoV-2 target nucleic acids are DETECTED.  The SARS-CoV-2 RNA is generally detectable in upper respiratory specimens during the acute phase of infection. Positive results are indicative of the presence of the identified virus, but do not rule out bacterial infection or co-infection with other pathogens not detected by the test. Clinical correlation with patient history and other  diagnostic information is necessary to determine patient infection status. The expected result is Negative.  Fact Sheet for Patients: EntrepreneurPulse.com.au  Fact Sheet for Healthcare Providers: IncredibleEmployment.be  This test is not yet approved or cleared by the [place] FDA and  has been authorized for detection and/or diagnosis of SARS-CoV-2 by FDA under an Emergency Use Authorization (EUA).  This EUA will remain in effect (meaning this test ca n be used) for the duration of  the COVID-19 declaration under Section 564(b)(1) of the Act, 21 U.S.C. section 360bbb-3(b)(1), unless the authorization is terminated or revoked sooner.     Influenza A by PCR NEGATIVE NEGATIVE   Influenza B by PCR NEGATIVE NEGATIVE    Comment: (NOTE) The Xpert Xpress SARS-CoV-2/FLU/RSV plus assay is intended as an aid in the diagnosis of influenza from Nasopharyngeal swab specimens and should not be used as a sole basis for treatment. Nasal washings and aspirates are unacceptable for Xpert Xpress SARS-CoV-2/FLU/RSV testing.  Fact Sheet for Patients: EntrepreneurPulse.com.au  Fact Sheet for Healthcare Providers: IncredibleEmployment.be  This test is not yet approved or cleared  by the [place] FDA and has been authorized for detection and/or diagnosis of SARS-CoV-2 by FDA under an Emergency Use Authorization (EUA). This EUA will remain in effect (meaning this test can be used) for the duration of the COVID-19 declaration under Section 564(b)(1) of the Act, 21 U.S.C. section 360bbb-3(b)(1), unless the authorization is terminated or revoked.  Performed at [facility], [place] [address], [place], [place] [address]     Medications:  Current Facility-Administered Medications  Medication Dose Route Frequency Provider Last Rate Last Admin  . acetaminophen (TYLENOL) tablet 650 mg  650 mg Oral Q6H PRN Mesner, Corene Cornea, MD   650 mg at 03/12/21 1304  . alum & mag hydroxide-simeth (MAALOX/MYLANTA) 200-200-20 MG/5ML suspension 30 mL  30 mL Oral Q6H PRN Isla Pence, MD      . cephALEXin (KEFLEX) capsule 500 mg  500 mg Oral Q6H Isla Pence, MD   500 mg at 03/12/21 1727  . diltiazem (CARDIZEM CD) 24 hr capsule 120 mg  120 mg Oral QPM Isla Pence, MD   120 mg at 03/12/21 1727  . docusate sodium (COLACE) capsule 100 mg  100 mg Oral Daily PRN Isla Pence, MD   100 mg at 03/11/21 2031  . DULoxetine (CYMBALTA) DR capsule 20 mg  20 mg Oral BID Thomes Lolling H, NP      . gabapentin (NEURONTIN) capsule 1,200 mg  1,200 mg Oral BID Isla Pence, MD   1,200 mg at 03/11/21 2029  . hydrochlorothiazide (HYDRODIURIL) tablet 25 mg  25 mg Oral q morning Isla Pence, MD   25 mg at 03/12/21 0930  . losartan (COZAAR) tablet 100 mg  100 mg Oral Daily Isla Pence, MD   100 mg at 03/12/21 0909  . metFORMIN (GLUCOPHAGE) tablet 1,000 mg  1,000 mg Oral BID WC Isla Pence, MD   1,000 mg at 03/12/21 1633  . ondansetron (ZOFRAN) tablet 4 mg  4 mg Oral Q8H PRN Isla Pence, MD      . pantoprazole (PROTONIX) EC tablet 20 mg  20 mg Oral QPM Isla Pence, MD   20 mg at 03/12/21 1727  . polycarbophil (FIBERCON) tablet 625 mg  625 mg Oral Daily Isla Pence, MD   625 mg at 03/12/21 0930   Current Outpatient Medications  Medication Sig Dispense Refill  . Ascorbic Acid (VITAMIN C PO) Take 1 tablet by mouth at bedtime.    [name] Kitchen  BIOTIN PO Take 1 tablet by mouth at bedtime.    . Calcium Carbonate-Vitamin D (CALCIUM-VITAMIN D3 PO) Take 1 tablet by mouth at bedtime.    . Cyanocobalamin (VITAMIN B 12 PO) Take 1 tablet by mouth at bedtime.    [name] Kitchen diltiazem (CARDIZEM CD) 120 MG 24 hr capsule Take 1 capsule (120 mg total) by mouth daily. (Patient taking differently: Take 120 mg by mouth every evening.) 90 capsule 3  . docusate sodium (COLACE) 100 MG capsule Take 1 capsule (100 mg total) by mouth daily as needed. (Patient taking differently: Take 100 mg by mouth daily as needed for mild constipation.) 30 capsule 0  . FIBER ADULT GUMMIES PO Take 1 tablet by mouth at bedtime.    . gabapentin (NEURONTIN) 600 MG tablet Take 1 tablet (600 mg total) by mouth 2 (two) times daily. (Patient taking differently: Take 1,200 mg by mouth 2 (two) times daily.) 180 tablet 3  . hydrochlorothiazide (HYDRODIURIL) 25 MG tablet Take 1 tablet (25 mg total) by mouth daily. (Patient taking differently: Take 25 mg by mouth every morning.) 90 tablet 3  . HYDROcodone-acetaminophen (NORCO) 10-325 MG tablet Take 1 tablet by mouth 4 (four) times daily.    [name] Kitchen losartan (COZAAR) 100 MG tablet Take 1 tablet (100 mg total) by mouth daily. (Patient taking differently: Take 100 mg by mouth every morning.) 90 tablet 3  . metFORMIN (GLUCOPHAGE) 1000 MG tablet Take 1 tablet (1,000 mg total) by mouth 2 (two) times daily. (Patient taking differently: Take 1,000 mg by mouth 2 (two) times daily with a meal.) 180 tablet 3  . Multiple Vitamins-Minerals (ADULT ONE DAILY GUMMIES) CHEW Chew 1 tablet by mouth at bedtime.    . pantoprazole (PROTONIX) 20 MG tablet TAKE 1 TO 2 TABLETS (20-40 MG TOTAL) BY MOUTH DAILY. (Patient taking differently: Take 20 mg by mouth every evening.) 180 tablet 1  . diazepam (VALIUM) 5  MG tablet Take 1 tablet (5 mg total) by mouth every 8 (eight) hours as needed for muscle spasms. (Patient not taking: No sig reported) 10 tablet 0    Musculoskeletal: Strength & Muscle Tone: within normal limits Gait & Station: normal Patient leans: N/A  Psychiatric Specialty Exam: Physical Exam Vitals reviewed.  HENT:     Head: Normocephalic.     Right Ear: Tympanic membrane normal.     Left Ear: Tympanic membrane normal.     Nose: Nose normal.     Mouth/Throat:     Pharynx: Oropharynx is clear.  Eyes:     Conjunctiva/sclera: Conjunctivae normal.  Cardiovascular:     Rate and Rhythm: Normal rate.  Pulmonary:     Effort: Pulmonary effort is normal. No respiratory distress.  Abdominal:     Tenderness: There is no guarding.  Musculoskeletal:        General: Normal range of motion.     Cervical back: Normal range of motion.  Skin:    General: Skin is dry.  Neurological:     Mental Status: She is alert and oriented to person, place, and time.  Psychiatric:        Attention and Perception: Attention normal.        Mood and Affect: Mood normal.        Speech: Speech normal.        Behavior: Behavior normal. Behavior is cooperative.        Thought Content: Thought content is not paranoid or delusional. Thought content includes suicidal ideation. Thought content does not include  homicidal ideation. Thought content does not include homicidal or suicidal plan.        Cognition and Memory: Cognition normal.        Judgment: Judgment is impulsive.     Review of Systems  Constitutional: Negative.   HENT: Negative.   Respiratory: Negative.   Cardiovascular: Negative.   Gastrointestinal: Negative.   Endocrine: Negative.   Genitourinary: Negative.   Musculoskeletal: Negative.   Skin: Negative.   Allergic/Immunologic: Negative.   Neurological: Negative.   Hematological: Negative.   Psychiatric/Behavioral: Negative.     Blood pressure 130/72, pulse (!) 105, temperature 98.2 F  (36.8 C), temperature source Oral, resp. rate (!) 24, height 5' 6.5" (1.689 m), weight 88.5 kg, SpO2 99 %.Body mass index is 31 kg/m.  General Appearance: Fairly Groomed  Eye Contact:  Good  Speech:  Clear and Coherent and Normal Rate  Volume:  Normal  Mood:  Depressed  Affect:  Congruent  Thought Process:  Coherent and Goal Directed  Orientation:  Full (Time, Place, and Person)  Thought Content:  Logical  Suicidal Thoughts:  No  Homicidal Thoughts:  No  Memory:  Immediate;   Good Recent;   Good Remote;   Good  Judgement:  Poor  Insight:  Good  Psychomotor Activity:  Normal and Decreased  Concentration:  Concentration: Good and Attention Span: Good  Recall:  Good  Fund of Knowledge:  Good  Language:  Good  Akathisia:  No  Handed:  Right  AIMS (if indicated):     Assets:  Communication Skills Desire for Improvement Financial Resources/Insurance Housing Leisure Time Physical Health Resilience Social Support Transportation Others:  family  ADL's:  Intact  Cognition:  WNL  Sleep:   Denies any issues with sleep      Treatment Plan Summary:  Cymbalta 20 mg PO BID initiated. If tolerated  does may be adjusted. ALT and AST are within normal limits. Liver function test ordered.   Psych provider/TTS will reassess in am.   Disposition:  Patient continues to be high risk and continues to meet inpatient gero-psychiatric admission criteria.  This service was provided via telemedicine using a 2-way, interactive audio and video technology.  Names of all persons participating in this telemedicine service and their role in this encounter. Name: Marilyn Myers  Role: patient   Name: Thomes Lolling  Role: NP  Name:  Hosie Poisson via telephone  Role: son  Name:  Role:     Revonda Humphrey, NP 03/12/2021 6:07 PM

## 2021-03-12 NOTE — ED Notes (Signed)
Pt ambulated to restroom with steady gait.  NADN, assisted pt back to bed, warm blanket provided.

## 2021-03-13 DIAGNOSIS — R45851 Suicidal ideations: Secondary | ICD-10-CM | POA: Diagnosis not present

## 2021-03-13 DIAGNOSIS — E876 Hypokalemia: Secondary | ICD-10-CM | POA: Diagnosis not present

## 2021-03-13 DIAGNOSIS — N3000 Acute cystitis without hematuria: Secondary | ICD-10-CM | POA: Diagnosis not present

## 2021-03-13 DIAGNOSIS — U071 COVID-19: Secondary | ICD-10-CM | POA: Diagnosis not present

## 2021-03-13 DIAGNOSIS — T402X1A Poisoning by other opioids, accidental (unintentional), initial encounter: Secondary | ICD-10-CM | POA: Diagnosis not present

## 2021-03-13 MED ORDER — CEPHALEXIN 500 MG PO CAPS: 500.0000 mg | ORAL_CAPSULE | Freq: Four times a day (QID) | ORAL | 0 refills | Status: DC

## 2021-03-13 NOTE — Discharge Instructions (Signed)
For your behavioral health needs you are advised to follow up an outpatient provider at your earliest opportunity.  In the [place] area, consider the [facility].  Due to Covid-19 most of their services are currently being offered virtually.  The programs they offer include:  Outpatient psychiatry: for medication management, by appointment Outpatient therapy: talk therapy, by appointment Mental Health Intensive Outpatient Program: group therapy, meets daily Monday - Friday from 9:00 am to 12:00 pm, usually for about two weeks Substance Abuse Intensive Outpatient Program: group therapy, meets Monday, Wednesday, Thursday from 1:00 pm to 4:00 pm IN PERSON for several weeks Partial Hospitalization Program: group therapy plus psychiatry, meets daily Monday - Friday from 9:00 am to 1:00 pm, usually for about two weeks  Call them at your earliest opportunity to schedule an intake appointment.       [facility]. [employer]. [address], [place] [address]      [phone]  If you relocate to a different area, call the customer service number on your health insurance card to find an in-network provider in your community.

## 2021-03-13 NOTE — BH Assessment (Signed)
[facility] Assessment Progress Note  Per Oneida Alar, NP, this pt no longer requires psychiatric hospitalization at this time.  Pt presents under IVC initiated by EDP Isla Pence, MD which has been rescinded by EDP Pattricia Boss, MD.  Pt is psychiatrically cleared.  Pt reportedly may be relocating to [place].  Discharge instructions include referral information for the [facility] with a synopsis of the programs that they offer, but also advise pt to call the customer service number on her health insurance card if she relocates to find an in-network provider in her community.  Dr Jeanell Sparrow and pt's nurse, Levada Dy, have been notified.  At 14:39 I called pt's son, Timmothy Sours [phone]), to notify him of disposition.  He agrees to present at [facility] around 16:00 to pick pt up.  Jalene Mullet, [place] Triage Specialist [phone]

## 2021-03-13 NOTE — BH Assessment (Signed)
[facility] Assessment Progress Note  Pt is to be seen by Oneida Alar, NP today for re-evaluation.  This is pending as of this writing.  At 12:08 this Probation officer received a call from pt's son and putative ASHTYNN, BERKE [phone]), requesting update on pt's disposition.  I requested Advance Directives, which he agreed to send to me and which I have since received.  I informed him that psychiatry would be rounding on pt today and agreed to call him back with new findings if Jerene Pitch did not speak to him first.  Advance Directives have been placed on pt's paper chart and will also be sent to Medical Records to scan into pt's EPIC record.  I have contacted Jerene Pitch about encounter noted above.  Final disposition is pending as of this writing.  Jalene Mullet, [place] Coordinator [phone]

## 2021-03-13 NOTE — Consult Note (Signed)
Telepsych Consultation   Reason for Consult:  Psych consult Referring Physician:  Kerry Dory, MD Location of Patient: Marilyn Myers [id number] Location of Provider: [place] Department  Patient Identification: Marilyn Myers MRN:  [id number] Principal Diagnosis: Suicide attempt [facility]) Diagnosis:  Principal Problem:   Suicide attempt [facility])  Total Time spent with patient: 30 minutes  Subjective:   Marilyn Myers is a 70 y.o. female patient admitted with intentional overdose.  Patient presents alert and oriented, calm and cooperative; pleasant affect. States she was recently nauseous, unsure of cause. "Well I took an overdose on purpose of pills, then when I got here I found out I had COVID. I have neuropathy really bad and I've just been in so much pain and my kids are all grown and my husband passed away a few years ago so my thinking was I just didn't really feel like I had nothing to live for. I just couldn't stand that pain anymore, that's it in a nutshell. I'm now feeling like that was a really stupid thing to do, I've talked to all of my children and they're all glad it didn't work and so am I. I don't know what I was thinking if I was thinking at all. I also need something to help me get off these opioids".    Patient has no psychiatric history; active supports include her 3 children. She denies any active suicidal or homicidal ideations, auditory or visual hallucinations, and does not appear to be responding to any external/internal stimuli. Patient contracts for safety; son to come live with in the home short term with plan for patient to go to [place] with sister long-term. Patient is COVID+.   Collateral: Marilyn Myers (son) [phone] "I think it would make more sense for me to stay with my mom in her house for the time being and my sister more long term given she lives in [place]".   Son states he went into the home and "isolated" opiates where she can't find it and threw out all  of the old medications. No firearms in the home. States dad died x2 years ago and she battled some depression then but they got her situated in a home and she became more social so on the surface she appeared fine. States he has been in contact with mom and feels mom may have become addicted to opiates; plan to engage mom in possible addiction counseling. Son denies any safety concerns states he will stay with mom in [place] for the short-term with plans for mom to got to [place] with his sister for a longer term (a couple of weeks).   HPI:   Marilyn Myers is a 70 year old female who presented to the ED 03/11/21 for an intentional overdose of oxycodone as a suicide attempt. Patient has not noted psychiatric history and extensive past medical history including bariatric surgery, PVC's, kidney stones, and OSA. Patient is currently COVID+.   Past Psychiatric History:  -none  Risk to Self:  pt denies Risk to Others:  pt denies Prior Inpatient Therapy:  pt denies Prior Outpatient Therapy:  pt denies  Past Medical History:  Past Medical History:  Diagnosis Date  . Anxiety   . Diverticulitis of intestine without perforation or abscess   . Diverticulosis, sigmoid   . GERD (gastroesophageal reflux disease)   . History of diverticulitis of colon    09/ 2015  RESOLVED   . History of kidney stones   . Hypertension   .  Nephrolithiasis    BILATERAL  . OSA on CPAP    no longer uses   . Right ureteral stone   . Seasonal allergic rhinitis 01/24/2018  . Type 2 diabetes mellitus ([place])    type 2     Past Surgical History:  Procedure Laterality Date  . BARIATRIC SURGERY    . CERVICAL CONE BIOPSY  1976  . COLON RESECTION     colostomy x 6 months  . CYSTO/  BILATERAL RETROGRADE PYELOGRAM/  PLACEMENT BILATERAL URETERAL STENTS  07-10-2011  . CYSTOSCOPY WITH RETROGRADE PYELOGRAM, URETEROSCOPY AND STENT PLACEMENT Right 10/11/2014   Procedure: CYSTOSCOPY WITH RETROGRADE PYELOGRAM, URETEROSCOPY, right  ureteral biopsy, AND STENT PLACEMENT;  Surgeon: Arvil Persons, MD;  [facility]: [facility];  Service: Urology;  Laterality: Right;  . CYSTOSCOPY/RETROGRADE/URETEROSCOPY  10/08/2011   Procedure: CYSTOSCOPY/RETROGRADE/URETEROSCOPY;  Surgeon: Hanley Ben, MD;  Location: [facility];  Service: Urology;  Laterality: N/A;  CYSTOSCOPY, RIGHT  RETROGRADE, RIGHT URETEROSCOPY HOLMIUM LASER WITH BASKET STONE EXTRACTION AND RIGHT URETERAL STENT PLACEMENT  . CYSTOSCOPY/URETEROSCOPY/HOLMIUM LASER/STENT PLACEMENT Right 02/20/2021   Procedure: CYSTOSCOPY RETROGRADE/URETEROSCOPY/HOLMIUM LASER/STENT PLACEMENT/BASKET STONE EXTRACTION;  Surgeon: Janith Lima, MD;  Location: [facility];  Service: Urology;  Laterality: Right;  ONLY NEEDS 30 MIN  . EXTRACORPOREAL SHOCK WAVE LITHOTRIPSY  x3  last one 01-24-2012  . TUBAL LIGATION  1987  . URETEROSCPIC STONE EXTRACTION/ STENT PLACEMENT  left 08-20-2011   Family History:  Family History  Problem Relation Age of Onset  . Breast cancer Mother        80  . Renal Disease Mother   . Heart disease Mother   . Diabetes Paternal Grandmother   . Diabetes Maternal Aunt    Family Psychiatric  History: not noted Social History:  Social History   Substance and Sexual Activity  Alcohol Use Never     Social History   Substance and Sexual Activity  Drug Use No    Social History   Socioeconomic History  . Marital status: Widowed    Spouse name: Not on file  . Number of children: 3  . Years of education: Not on file  . Highest education level: Not on file  Occupational History  . Occupation: Retired   Tobacco Use  . Smoking status: Former Smoker    Packs/day: 1.00    Years: 25.00    Pack years: 25.00    Types: Cigarettes    Quit date: 10/07/1996    Years since quitting: 24.4  . Smokeless tobacco: Never Used  Vaping Use  . Vaping Use: Never used  Substance and Sexual Activity  . Alcohol use: Never  . Drug use: No  . Sexual  activity: Never  Other Topics Concern  . Not on file  Social History Narrative   In process of selling home and moving to [place] with daughter   Social Determinants of Health   Financial Resource Strain: Low Risk   . Difficulty of Paying Living Expenses: Not hard at all  Food Insecurity: No Food Insecurity  . Worried About Charity fundraiser in the Last Year: Never true  . Ran Out of Food in the Last Year: Never true  Transportation Needs: No Transportation Needs  . Lack of Transportation (Medical): No  . Lack of Transportation (Non-Medical): No  Physical Activity: Inactive  . Days of Exercise per Week: 0 days  . Minutes of Exercise per Session: 0 min  Stress: No Stress Concern Present  . Feeling of  Stress : Not at all  Social Connections: Moderately Isolated  . Frequency of Communication with Friends and Family: More than three times a week  . Frequency of Social Gatherings with Friends and Family: Once a week  . Attends Religious Services: More than 4 times per year  . Active Member of Clubs or Organizations: No  . Attends Archivist Meetings: Never  . Marital Status: Widowed   Additional Social History:   Allergies:   Allergies  Allergen Reactions  . Ace Inhibitors Cough  . Codeine Nausea And Vomiting  . Flomax [Tamsulosin Hcl] Itching   Labs:  Results for orders placed or performed during the hospital encounter of 03/11/21 (from the past 48 hour(s))  Urine rapid drug screen (hosp performed)     Status: Abnormal   Collection Time: 03/11/21  2:40 PM  Result Value Ref Range   Opiates POSITIVE (A) NONE DETECTED   Cocaine NONE DETECTED NONE DETECTED   Benzodiazepines NONE DETECTED NONE DETECTED   Amphetamines NONE DETECTED NONE DETECTED   Tetrahydrocannabinol NONE DETECTED NONE DETECTED   Barbiturates NONE DETECTED NONE DETECTED    Comment: (NOTE) DRUG SCREEN FOR MEDICAL PURPOSES ONLY.  IF CONFIRMATION IS NEEDED FOR ANY PURPOSE, NOTIFY LAB WITHIN 5  DAYS.  LOWEST DETECTABLE LIMITS FOR URINE DRUG SCREEN Drug Class                     Cutoff (ng/mL) Amphetamine and metabolites    1000 Barbiturate and metabolites    200 Benzodiazepine                 875 Tricyclics and metabolites     300 Opiates and metabolites        300 Cocaine and metabolites        300 THC                            50 Performed at [facility], [place] [address], [place], [place] [address]   Urinalysis, Routine w reflex microscopic     Status: Abnormal   Collection Time: 03/11/21  2:40 PM  Result Value Ref Range   Color, Urine YELLOW YELLOW   APPearance CLOUDY (A) CLEAR   Specific Gravity, Urine 1.023 1.005 - 1.030   pH 5.0 5.0 - 8.0   Glucose, UA NEGATIVE NEGATIVE mg/dL   Hgb urine dipstick NEGATIVE NEGATIVE   Bilirubin Urine NEGATIVE NEGATIVE   Ketones, ur 20 (A) NEGATIVE mg/dL   Protein, ur 100 (A) NEGATIVE mg/dL   Nitrite POSITIVE (A) NEGATIVE   Leukocytes,Ua LARGE (A) NEGATIVE   RBC / HPF 11-20 0 - 5 RBC/hpf   WBC, UA >50 (H) 0 - 5 WBC/hpf   Bacteria, UA MANY (A) NONE SEEN   Squamous Epithelial / LPF 0-5 0 - 5   WBC Clumps PRESENT    Mucus PRESENT    Non Squamous Epithelial 0-5 (A) NONE SEEN    Comment: Performed at [facility], [place] [address], [place], [place] [address]  Comprehensive metabolic panel     Status: Abnormal   Collection Time: 03/11/21  4:45 PM  Result Value Ref Range   Sodium 139 135 - 145 mmol/L   Potassium 3.0 (L) 3.5 - 5.1 mmol/L   Chloride 101 98 - 111 mmol/L   CO2 20 (L) 22 - 32 mmol/L   Glucose, Bld 149 (H) 70 - 99 mg/dL    Comment:  Glucose reference range applies only to samples taken after fasting for at least 8 hours.   BUN 25 (H) 8 - 23 mg/dL   Creatinine, Ser 1.01 (H) 0.44 - 1.00 mg/dL   Calcium 9.7 8.9 - 10.3 mg/dL   Total Protein 8.2 (H) 6.5 - 8.1 g/dL   Albumin 3.9 3.5 - 5.0 g/dL   AST 22 15 - 41 U/L   ALT 24 0 - 44 U/L   Alkaline Phosphatase 84 38 - 126 U/L   Total  Bilirubin 1.3 (H) 0.3 - 1.2 mg/dL   GFR, Estimated 60 (L) >60 mL/min    Comment: (NOTE) Calculated using the CKD-EPI Creatinine Equation (2021)    Anion gap 18 (H) 5 - 15    Comment: Performed at [facility], [place] [address], [place], [place] [address]  Ethanol     Status: None   Collection Time: 03/11/21  4:45 PM  Result Value Ref Range   Alcohol, Ethyl (B) <10 <10 mg/dL    Comment: (NOTE) Lowest detectable limit for serum alcohol is 10 mg/dL.  For medical purposes only. Performed at [facility], [place] [address], [place], [place] [address]   CBC with Diff     Status: Abnormal   Collection Time: 03/11/21  4:45 PM  Result Value Ref Range   WBC 12.3 (H) 4.0 - 10.5 K/uL   RBC 4.01 3.87 - 5.11 MIL/uL   Hemoglobin 11.8 (L) 12.0 - 15.0 g/dL   HCT 36.5 36.0 - 46.0 %   MCV 91.0 80.0 - 100.0 fL   MCH 29.4 26.0 - 34.0 pg   MCHC 32.3 30.0 - 36.0 g/dL   RDW 14.5 11.5 - 15.5 %   Platelets 303 150 - 400 K/uL   nRBC 0.0 0.0 - 0.2 %   Neutrophils Relative % 77 %   Neutro Abs 9.3 (H) 1.7 - 7.7 K/uL   Lymphocytes Relative 14 %   Lymphs Abs 1.7 0.7 - 4.0 K/uL   Monocytes Relative 9 %   Monocytes Absolute 1.2 (H) 0.1 - 1.0 K/uL   Eosinophils Relative 0 %   Eosinophils Absolute 0.0 0.0 - 0.5 K/uL   Basophils Relative 0 %   Basophils Absolute 0.0 0.0 - 0.1 K/uL   Immature Granulocytes 0 %   Abs Immature Granulocytes 0.04 0.00 - 0.07 K/uL    Comment: Performed at [facility], [place] [address], [place], [place] [address]  Acetaminophen level     Status: None   Collection Time: 03/11/21  4:45 PM  Result Value Ref Range   Acetaminophen (Tylenol), Serum 13 10 - 30 ug/mL    Comment: (NOTE) Therapeutic concentrations vary significantly. A range of 10-30 ug/mL  may be an effective concentration for many patients. However, some  are best treated at concentrations outside of this range. Acetaminophen concentrations >150 ug/mL at 4 hours  after ingestion  and >50 ug/mL at 12 hours after ingestion are often associated with  toxic reactions.  Performed at [facility], [place] [address] [place], [place] [address]   Salicylate level     Status: Abnormal   Collection Time: 03/11/21  4:45 PM  Result Value Ref Range   Salicylate Lvl <1.4 (L) 7.0 - 30.0 mg/dL    Comment: Performed at [facility], [place] [address], [place], [place] [address]  Resp Panel by RT-PCR (Flu A&B, Covid) Nasopharyngeal Swab     Status: Abnormal   Collection Time: 03/11/21  4:50 PM   Specimen: Nasopharyngeal Swab; Nasopharyngeal(NP) swabs in vial transport medium  Result Value Ref Range   SARS Coronavirus 2 by RT PCR POSITIVE (A) NEGATIVE    Comment: RESULT CALLED TO, READ BACK BY AND VERIFIED WITH: SMITH J. 04.20.22 @ 2117 BY MECIAL J. (NOTE) SARS-CoV-2 target nucleic acids are DETECTED.  The SARS-CoV-2 RNA is generally detectable in upper respiratory specimens during the acute phase of infection. Positive results are indicative of the presence of the identified virus, but do not rule out bacterial infection or co-infection with other pathogens not detected by the test. Clinical correlation with patient history and other diagnostic information is necessary to determine patient infection status. The expected result is Negative.  Fact Sheet for Patients: EntrepreneurPulse.com.au  Fact Sheet for Healthcare Providers: IncredibleEmployment.be  This test is not yet approved or cleared by the [place] FDA and  has been authorized for detection and/or diagnosis of SARS-CoV-2 by FDA under an Emergency Use Authorization (EUA).  This EUA will remain in effect (meaning this test ca n be used) for the duration of  the COVID-19 declaration under Section 564(b)(1) of the Act, 21 U.S.C. section 360bbb-3(b)(1), unless the authorization is terminated or revoked sooner.     Influenza  A by PCR NEGATIVE NEGATIVE   Influenza B by PCR NEGATIVE NEGATIVE    Comment: (NOTE) The Xpert Xpress SARS-CoV-2/FLU/RSV plus assay is intended as an aid in the diagnosis of influenza from Nasopharyngeal swab specimens and should not be used as a sole basis for treatment. Nasal washings and aspirates are unacceptable for Xpert Xpress SARS-CoV-2/FLU/RSV testing.  Fact Sheet for Patients: EntrepreneurPulse.com.au  Fact Sheet for Healthcare Providers: IncredibleEmployment.be  This test is not yet approved or cleared by the [place] FDA and has been authorized for detection and/or diagnosis of SARS-CoV-2 by FDA under an Emergency Use Authorization (EUA). This EUA will remain in effect (meaning this test can be used) for the duration of the COVID-19 declaration under Section 564(b)(1) of the Act, 21 U.S.C. section 360bbb-3(b)(1), unless the authorization is terminated or revoked.  Performed at [facility], [place] [address], [place], [place] [address]    Medications:  Current Facility-Administered Medications  Medication Dose Route Frequency Provider Last Rate Last Admin  . acetaminophen (TYLENOL) tablet 650 mg  650 mg Oral Q6H PRN Mesner, Corene Cornea, MD   650 mg at 03/13/21 0352  . alum & mag hydroxide-simeth (MAALOX/MYLANTA) 200-200-20 MG/5ML suspension 30 mL  30 mL Oral Q6H PRN Isla Pence, MD      . cephALEXin (KEFLEX) capsule 500 mg  500 mg Oral Q6H Isla Pence, MD   500 mg at 03/13/21 7253  . diltiazem (CARDIZEM CD) 24 hr capsule 120 mg  120 mg Oral QPM Isla Pence, MD   120 mg at 03/12/21 1727  . docusate sodium (COLACE) capsule 100 mg  100 mg Oral Daily PRN Isla Pence, MD   100 mg at 03/11/21 2031  . DULoxetine (CYMBALTA) DR capsule 20 mg  20 mg Oral BID Revonda Humphrey, NP   20 mg at 03/13/21 0028  . gabapentin (NEURONTIN) capsule 1,200 mg  1,200 mg Oral BID Isla Pence, MD   1,200 mg at 03/11/21 2029   . hydrochlorothiazide (HYDRODIURIL) tablet 25 mg  25 mg Oral q morning Isla Pence, MD   25 mg at 03/12/21 0930  . losartan (COZAAR) tablet 100 mg  100 mg Oral Daily Isla Pence, MD   100 mg at 03/12/21 0909  .  metFORMIN (GLUCOPHAGE) tablet 1,000 mg  1,000 mg Oral BID WC Isla Pence, MD   1,000 mg at 03/13/21 6767  . ondansetron (ZOFRAN) tablet 4 mg  4 mg Oral Q8H PRN Isla Pence, MD   4 mg at 03/13/21 0352  . pantoprazole (PROTONIX) EC tablet 20 mg  20 mg Oral QPM Isla Pence, MD   20 mg at 03/12/21 1727  . polycarbophil (FIBERCON) tablet 625 mg  625 mg Oral Daily Isla Pence, MD   625 mg at 03/12/21 0930   Current Outpatient Medications  Medication Sig Dispense Refill  . Ascorbic Acid (VITAMIN C PO) Take 1 tablet by mouth at bedtime.    [name] Kitchen BIOTIN PO Take 1 tablet by mouth at bedtime.    . Calcium Carbonate-Vitamin D (CALCIUM-VITAMIN D3 PO) Take 1 tablet by mouth at bedtime.    . Cyanocobalamin (VITAMIN B 12 PO) Take 1 tablet by mouth at bedtime.    [name] Kitchen diltiazem (CARDIZEM CD) 120 MG 24 hr capsule Take 1 capsule (120 mg total) by mouth daily. (Patient taking differently: Take 120 mg by mouth every evening.) 90 capsule 3  . docusate sodium (COLACE) 100 MG capsule Take 1 capsule (100 mg total) by mouth daily as needed. (Patient taking differently: Take 100 mg by mouth daily as needed for mild constipation.) 30 capsule 0  . FIBER ADULT GUMMIES PO Take 1 tablet by mouth at bedtime.    . gabapentin (NEURONTIN) 600 MG tablet Take 1 tablet (600 mg total) by mouth 2 (two) times daily. (Patient taking differently: Take 1,200 mg by mouth 2 (two) times daily.) 180 tablet 3  . hydrochlorothiazide (HYDRODIURIL) 25 MG tablet Take 1 tablet (25 mg total) by mouth daily. (Patient taking differently: Take 25 mg by mouth every morning.) 90 tablet 3  . HYDROcodone-acetaminophen (NORCO) 10-325 MG tablet Take 1 tablet by mouth 4 (four) times daily.    [name] Kitchen losartan (COZAAR) 100 MG tablet Take 1  tablet (100 mg total) by mouth daily. (Patient taking differently: Take 100 mg by mouth every morning.) 90 tablet 3  . metFORMIN (GLUCOPHAGE) 1000 MG tablet Take 1 tablet (1,000 mg total) by mouth 2 (two) times daily. (Patient taking differently: Take 1,000 mg by mouth 2 (two) times daily with a meal.) 180 tablet 3  . Multiple Vitamins-Minerals (ADULT ONE DAILY GUMMIES) CHEW Chew 1 tablet by mouth at bedtime.    . pantoprazole (PROTONIX) 20 MG tablet TAKE 1 TO 2 TABLETS (20-40 MG TOTAL) BY MOUTH DAILY. (Patient taking differently: Take 20 mg by mouth every evening.) 180 tablet 1  . diazepam (VALIUM) 5 MG tablet Take 1 tablet (5 mg total) by mouth every 8 (eight) hours as needed for muscle spasms. (Patient not taking: No sig reported) 10 tablet 0   Musculoskeletal: Strength & Muscle Tone: within normal limits Gait & Station: unable to assess Patient leans: N/A  Psychiatric Specialty Exam: Physical Exam Vitals and nursing note reviewed.  Psychiatric:        Attention and Perception: Attention and perception normal.        Mood and Affect: Mood and affect normal.        Speech: Speech normal.        Behavior: Behavior normal. Behavior is cooperative.        Thought Content: Thought content normal.        Cognition and Memory: Cognition and memory normal.        Judgment: Judgment normal.     Review of Systems  Psychiatric/Behavioral: Negative.   All other systems reviewed and are negative.   Blood pressure (!) 144/97, pulse (!) 109, temperature 98 F (36.7 C), temperature source Oral, resp. rate 18, height 5' 6.5" (1.689 m), weight 88.5 kg, SpO2 96 %.Body mass index is 31 kg/m.  General Appearance: Casual and Fairly Groomed  Eye Contact:  Good  Speech:  Clear and Coherent  Volume:  Normal  Mood:  Euthymic  Affect:  Appropriate  Thought Process:  Coherent and Goal Directed  Orientation:  Full (Time, Place, and Person)  Thought Content:  WDL and Logical  Suicidal Thoughts:  No   Homicidal Thoughts:  No  Memory:  Immediate;   Fair Recent;   Fair Remote;   Fair  Judgement:  Intact  Insight:  Good and Present  Psychomotor Activity:  Normal  Concentration:  Concentration: Good and Attention Span: Good  Recall:  Good  Fund of Knowledge:  Good  Language:  Fair  Akathisia:  NA  Handed:    AIMS (if indicated):     Assets:  Communication Skills Desire for Improvement Financial Resources/Insurance Housing Leisure Time Resilience Social Support Vocational/Educational  ADL's:  Intact  Cognition:  WNL  Sleep:      Treatment Plan Summary: Plan discharge patient home to son with outpatient resources for individual follow-up. Crisis and safety plan in place to include son staying in the home with patient until patient goes to [place] to live with daughter for a longer term. Son states he has safety proofed the home; patient has not access to firearms, medications discarded and/or locked away, and will have someone in the home at all times. Patient contracts for safety and denies any intent or plan to self harm.   Disposition: No evidence of imminent risk to self or others at present.   Patient does not meet criteria for psychiatric inpatient admission. Supportive therapy provided about ongoing stressors. Discussed crisis plan, support from social network, calling 911, coming to the Emergency Department, and calling Suicide Hotline.  This service was provided via telemedicine using a 2-way, interactive audio and video technology.  Names of all persons participating in this telemedicine service and their role in this encounter. Name: Oneida Alar Role: PMHNP  Name: Myles Lipps Role: Attending MD  Name: Marilyn Myers Role: patient  Name: Marilyn Myers Role: son    Inda Merlin, NP 03/13/2021 9:37 AM

## 2021-03-13 NOTE — ED Provider Notes (Signed)
Emergency Medicine Observation Re-evaluation Note  Marilyn Myers is a 70 y.o. female, seen on rounds today.  Pt initially presented to the ED for complaints of Suicidal Currently, the patient is awaiting placement.  Physical Exam  BP (!) 144/97   Pulse (!) 109   Temp 98 F (36.7 C) (Oral)   Resp 18   Ht 1.689 m (5' 6.5")   Wt 88.5 kg   SpO2 96%   BMI 31.00 kg/m  Physical Exam General: sitting on stool by bed Cardiac: rrr Lungs: cta Psych: cooperative  ED Course / MDM  EKG:EKG Interpretation  Date/Time:  Wednesday March 11 2021 14:57:12 EDT Ventricular Rate:  101 PR Interval:  168 QRS Duration: 74 QT Interval:  300 QTC Calculation: 389 R Axis:   91 Text Interpretation: Sinus tachycardia Rightward axis Low voltage QRS Nonspecific T wave abnormality Abnormal ECG No significant change since last tracing Confirmed by Isla Pence [phone]) on 03/11/2021 3:24:30 PM   I have reviewed the labs performed to date as well as medications administered while in observation.  Recent changes in the last 24 hours include patient is covid postiive.  Plan  Current plan is for psychiatric admission. Patient is under full IVC at this time. Psych is clearing patient  Son is coming to get her  Safety planning occurred with psych and son He will be taking to her to family in [place]    Rithika Seel, MD 03/13/21 1430

## 2021-03-16 ENCOUNTER — Ambulatory Visit (INDEPENDENT_AMBULATORY_CARE_PROVIDER_SITE_OTHER): Payer: Medicare Other

## 2021-03-16 ENCOUNTER — Telehealth: Payer: Self-pay

## 2021-03-16 DIAGNOSIS — F339 Major depressive disorder, recurrent, unspecified: Secondary | ICD-10-CM

## 2021-03-16 DIAGNOSIS — E1142 Type 2 diabetes mellitus with diabetic polyneuropathy: Secondary | ICD-10-CM

## 2021-03-16 NOTE — Progress Notes (Signed)
Chronic Care Management Pharmacy Note  03/17/2021 Name:  Marilyn Myers MRN:  [id number] DOB:  1951/02/19  Subjective: Marilyn Myers is an 70 y.o. year old female who is a primary patient of Willow Ora, MD.  The CCM team was consulted for assistance with disease management and care coordination needs.    Engaged with patient by telephone for follow up visit in response to provider referral for pharmacy case management and/or care coordination services.   Consent to Services:  The patient was given information about Chronic Care Management services, agreed to services, and gave verbal consent prior to initiation of services.  Please see initial visit note for detailed documentation.   Patient Care Team: Willow Ora, MD as PCP - General (Family Medicine) Dasher, Fayrene Fearing, MD (Surgery) Genia Del, MD as Consulting Physician (Obstetrics and Gynecology) Shea Evans [facility]) Aris Lot, MD as Consulting Physician (Dermatology) Roche, Nolon Bussing, PA-C as Physician Assistant (Pain Medicine) Dahlia Byes, [facility] as Pharmacist (Pharmacist)  Hospital visits: Medication Reconciliation was completed by comparing discharge summary, patient's EMR and Pharmacy list, and upon discussion with patient.  Admitted to the hospital on 03/11/2021-03/13/2021 due to suicide attempt.  New?Medications Started at [facility] Discharge:?? -started cymbalta 20 mg BID due to neuropathy -diazepam 5 mg TID for muscle spasms   Medications Discontinued at Hospital Discharge: -Stopped all opioids  Medications that remain the same after Hospital Discharge:??  -All other medications will remain the same.    Objective:  Lab Results  Component Value Date   CREATININE 1.01 (H) 03/11/2021   CREATININE 1.13 (H) 02/17/2021   CREATININE 1.08 (H) 01/25/2021    Lab Results  Component Value Date   HGBA1C 7.2 (H) 01/16/2021   Last diabetic Eye exam: No results found for: HMDIABEYEEXA  Last  diabetic Foot exam: No results found for: HMDIABFOOTEX      Component Value Date/Time   CHOL 197 01/16/2021 1107   TRIG 285.0 (H) 01/16/2021 1107   HDL 41.60 01/16/2021 1107   CHOLHDL 5 01/16/2021 1107   VLDL 57.0 (H) 01/16/2021 1107   LDLCALC 87 11/06/2018 0000   LDLDIRECT 117.0 01/16/2021 1107    Hepatic Function Latest Ref Rng & Units 03/11/2021 01/16/2021 11/03/2020  Total Protein 6.5 - 8.1 g/dL 8.2(H) 7.4 6.9  Albumin 3.5 - 5.0 g/dL 3.9 4.0 9.8(S)  AST 15 - 41 U/L 22 15 194(H)  ALT 0 - 44 U/L 24 12 59(H)  Alk Phosphatase 38 - 126 U/L 84 72 78  Total Bilirubin 0.3 - 1.2 mg/dL 5.1(M) 0.9 0.9    Lab Results  Component Value Date/Time   TSH 1.91 08/27/2019 09:27 AM    CBC Latest Ref Rng & Units 03/11/2021 02/17/2021 01/25/2021  WBC 4.0 - 10.5 K/uL 12.3(H) 7.2 10.7(H)  Hemoglobin 12.0 - 15.0 g/dL 11.8(L) 11.9(L) 13.5  Hematocrit 36.0 - 46.0 % 36.5 37.1 41.4  Platelets 150 - 400 K/uL 303 232 337    Lab Results  Component Value Date/Time   VD25OH 43.9 11/06/2018 12:00 AM    Clinical ASCVD: No  The 10-year ASCVD risk score Denman George DC Jr., et al., 2013) is: 24.8%   Values used to calculate the score:     Age: 1 years     Sex: Female     Is Non-Hispanic African American: No     Diabetic: Yes     Tobacco smoker: No     Systolic Blood Pressure: 124 mmHg     Is BP treated:  Yes     HDL Cholesterol: 41.6 mg/dL     Total Cholesterol: 197 mg/dL    Social History   Tobacco Use  Smoking Status Former Smoker  . Packs/day: 1.00  . Years: 25.00  . Pack years: 25.00  . Types: Cigarettes  . Quit date: 10/07/1996  . Years since quitting: 24.4  Smokeless Tobacco Never Used   BP Readings from Last 3 Encounters:  03/13/21 124/65  02/20/21 (!) 158/90  02/17/21 137/72   Pulse Readings from Last 3 Encounters:  03/13/21 86  02/20/21 (!) 56  02/17/21 77   Wt Readings from Last 3 Encounters:  03/12/21 195 lb (88.5 kg)  02/20/21 202 lb (91.6 kg)  01/25/21 205 lb (93 kg)     Assessment: Review of patient past medical history, allergies, medications, health status, including review of consultants reports, laboratory and other test data, was performed as part of comprehensive evaluation and provision of chronic care management services.   SDOH:  (Social Determinants of Health) assessments and interventions performed:    CCM Care Plan  Allergies  Allergen Reactions  . Ace Inhibitors Cough  . Codeine Nausea And Vomiting  . Flomax [Tamsulosin Hcl] Itching    Medications Reviewed Today    Reviewed by Madelin Rear, [facility] (Pharmacist) on 03/17/21 at 1246  Med List Status: <None>  Medication Order Taking? Sig Documenting Provider Last Dose Status Informant  Ascorbic Acid (VITAMIN C PO) [id number]  Take 1 tablet by mouth at bedtime. [provider]  Active Self  BIOTIN PO [id number]  Take 1 tablet by mouth at bedtime. [provider]  Active Self  Calcium Carbonate-Vitamin D (CALCIUM-VITAMIN D3 PO) [id number]  Take 1 tablet by mouth at bedtime. [provider]  Active Self  cephALEXin (KEFLEX) 500 MG capsule [id number]  Take 1 capsule (500 mg total) by mouth 4 (four) times daily. Valarie Merino, MD  Active   Cyanocobalamin (VITAMIN B 12 PO) [id number]  Take 1 tablet by mouth at bedtime. [provider]  Active Self  diazepam (VALIUM) 5 MG tablet [id number]  Take 1 tablet (5 mg total) by mouth every 8 (eight) hours as needed for muscle spasms.  Patient not taking: No sig reported   Daleen Bo, MD  Active Self  diltiazem (CARDIZEM CD) 120 MG 24 hr capsule [id number]  Take 1 capsule (120 mg total) by mouth daily.  Patient taking differently: Take 120 mg by mouth every evening.   [name], MD  Active Self  docusate sodium (COLACE) 100 MG capsule [id number]  Take 1 capsule (100 mg total) by mouth daily as needed.  Patient taking differently: Take 100 mg by mouth daily as needed for mild constipation.   Janith Lima, MD   Active Self  DULoxetine (CYMBALTA) 20 MG capsule [id number] Yes Take 20 mg by mouth 2 (two) times daily. [provider]  Active   FIBER ADULT GUMMIES PO [id number]  Take 1 tablet by mouth at bedtime. [provider]  Active Self  gabapentin (NEURONTIN) 600 MG tablet [id number]  Take 1 tablet (600 mg total) by mouth 2 (two) times daily.  Patient taking differently: Take 1,200 mg by mouth 2 (two) times daily.   [name], MD  Active Self  hydrochlorothiazide (HYDRODIURIL) 25 MG tablet [id number]  Take 1 tablet (25 mg total) by mouth daily.  Patient taking differently: Take 25 mg by mouth every morning.   [name], MD  Active Self  HYDROcodone-acetaminophen [facility]) [phone]  MG tablet [id number]  Take 1 tablet by mouth 4 (four) times daily. [provider]  Active Self           Med Note Broadus John, REGEENA   Wed Mar 11, 2021  2:49 PM) Took unknown number of tablets yesterday  losartan (COZAAR) 100 MG tablet [id number]  Take 1 tablet (100 mg total) by mouth daily.  Patient taking differently: Take 100 mg by mouth every morning.   [name], MD  Active Self  metFORMIN (GLUCOPHAGE) 1000 MG tablet [id number]  Take 1 tablet (1,000 mg total) by mouth 2 (two) times daily.  Patient taking differently: Take 1,000 mg by mouth 2 (two) times daily with a meal.   [name], MD  Active Self  Multiple Vitamins-Minerals (ADULT ONE DAILY GUMMIES) CHEW [id number]  Chew 1 tablet by mouth at bedtime. [provider]  Active Self  pantoprazole (PROTONIX) 20 MG tablet [id number]  TAKE 1 TO 2 TABLETS (20-40 MG TOTAL) BY MOUTH DAILY.  Patient taking differently: Take 20 mg by mouth every evening.   [name], MD  Active Self          Patient Active Problem List   Diagnosis Date Noted  . Episode of recurrent major depressive disorder ([place])   . Suicidal ideation   . Suicide attempt ([place])   . Gastroesophageal reflux disease without esophagitis 01/07/2020  .  Chronic narcotic use 01/07/2020  . Obesity (BMI 30-39.9) 02/22/2019  . Refusal of statin medication by patient 01/24/2018  . Seasonal allergic rhinitis 01/24/2018  . PVC's (premature ventricular contractions) 10/08/2016  . Diabetic peripheral neuropathy ([place]) 09/28/2016  . S/P colostomy takedown 07/02/2015  . History of osteopenia 05/19/2015  . Essential hypertension 01/28/2015  . Diverticulitis of large intestine 08/19/2014  . Hydradenitis 08/19/2014  . Nephrolithiasis 08/19/2014  . Obstructive sleep apnea 08/01/2014  . Controlled type 2 diabetes mellitus with diabetic polyneuropathy, without long-term current use of insulin ([place]) 06/10/2014    Immunization History  Administered Date(s) Administered  . Fluad Quad(high Dose 65+) 08/27/2019  . Influenza, High Dose Seasonal PF 08/19/2014, 09/02/2015, 10/08/2016, 09/22/2017, 08/29/2018  . Influenza-Unspecified 10/13/2020  . PFIZER(Purple Top)SARS-COV-2 Vaccination 12/29/2019, 01/18/2020, 10/13/2020  . Pneumococcal Conjugate-13 10/18/2016  . Pneumococcal Polysaccharide-23 08/19/2014    Conditions to be addressed/monitored: MDD HTN HLD Osteopenia GERD Obesity   Care Plan : [place]  Updates made by Madelin Rear, [facility] since 03/17/2021 12:00 AM    Problem: HTN DM Osteopenia MDD PVCs T2DM statin intolerance OSA Neuropathy   Note:   HTN DM Osteoporosis   Long-Range Goal: Disease Management   Start Date: 01/07/2021  Expected End Date: 01/07/2022  Recent Progress: Not on track  Priority: High  Note:   Pharmacist Clinical Goal(s):  [name] Kitchen Patient will contact provider office for questions/concerns as evidenced notation of same in electronic health record through collaboration with PharmD and provider.   Interventions: . 1:1 collaboration with [name], MD regarding development and update of comprehensive plan of care as evidenced by provider attestation and co-signature . Inter-disciplinary care team collaboration (see  longitudinal plan of care) . Comprehensive medication review performed; medication list updated in electronic medical record  Neuropathic Pain (Goal: minimize pain, optimize medication safety) -Uncontrolled -Has been taken off of all opioids following suicide attempt. Currently taking gabapentin 630m tab - 2 tabs BID (24045mper day) - feels this helps but is wearing off too soon. Was also started on cymbalta 20 mg twice daily during  hospitalization (continued on 10 days supply outpatient). Reports ongoing nausea and dry heaving, had forgotten about zofran at the house so plans to use that until PCP visit.  -Current treatment  . Duloxetine 20 mg twice daily (New) . Gabapentin 600 mg tablet - 1200 mg twice daily  -Encouraged family to reach out to local pain mgmt due to upcoming transition to [place], provided provider information below for pain and psych needs -Collaborated with PCP on gapabentin increase - ok with increasing to 1200 mg TID   MDD PHQ not assessed during today's visit -[facility] referral - pt/son had contacted them this am, however are opting to find [facility] provider in [place] as they are planning on patient living there for the next month.  -Patient's son Timmothy Sours is currently coordinating with Sister on best facilities to use for psych and pain needs. I provided patient with a list of providers accepting medicare insurance and new patients. Through [facility] if wanting to further consider referrals -Pain - Francis Gaines, MD  - [facility] [address] [place] [address]  - Phone [phone] Fax [phone] -Psychiatry  - Kennieth Francois, MD - [facility] [address] [place] [address]  - [phone]) [place] -provided above provider information to help with [place] care coordination -reviewed discharge medications at length, answered questions related to antibiotic therapy, gabapentin,  cymbalta -Will f/u early may to ensure coordination needs are met  Patient Goals/Self-Care Activities . Patient will:  - collaborate with provider on medication access solutions -reach out with any medication/care coordination related questions  Follow Up Plan: PCP visit 03/18/2021 Medication Assistance: None required.  Patient affirms current coverage meets needs.  Patient's preferred pharmacy is:  [facility], [address] [place] [address] Phone: [phone] Fax: [phone]     Follow Up:  Patient agrees to Care Plan and Follow-up.  Future Appointments  Date Time Provider [place]  03/18/2021  9:30 AM [name], MD [facility]  12/17/2021  3:15 PM [facility] HEALTH COACH [facility]

## 2021-03-16 NOTE — Telephone Encounter (Signed)
LVM asking patient to call back and get scheduled for a follow up Per Dr.Andy.

## 2021-03-17 NOTE — Patient Instructions (Signed)
Ms. Kalka,  Thank you for talking with me today. I have included our care plan/goals in the following pages.   Please review and call me at [phone] with any questions.  Thanks! Ellin Mayhew, Pharm.D., BCGP Clinical Pharmacist [facility] [place] [phone] Patient Care Plan: CCM Pharmacy Care Plan    Problem Identified: HTN DM Osteopenia MDD PVCs T2DM statin intolerance OSA Neuropathy   Note:   HTN DM Osteoporosis   Long-Range Goal: Disease Management   Start Date: 01/07/2021  Expected End Date: 01/07/2022  Recent Progress: Not on track  Priority: High  Note:   Pharmacist Clinical Goal(s):  [name] Kitchen Patient will contact provider office for questions/concerns as evidenced notation of same in electronic health record through collaboration with PharmD and provider.   Interventions: . 1:1 collaboration with [name], MD regarding development and update of comprehensive plan of care as evidenced by provider attestation and co-signature . Inter-disciplinary care team collaboration (see longitudinal plan of care) . Comprehensive medication review performed; medication list updated in electronic medical record  Neuropathic Pain (Goal: minimize pain, optimize medication safety) -Uncontrolled -Has been taken off of all opioids following suicide attempt. Currently taking gabapentin 646m tab - 2 tabs BID (24043mper day) - feels this helps but is wearing off too soon. Was also started on cymbalta 20 mg twice daily during hospitalization (continued on 10 days supply outpatient). Reports ongoing nausea and dry heaving, had forgotten about zofran at the house so plans to use that until PCP visit.  -Current treatment  . Duloxetine 20 mg twice daily (New) . Gabapentin 600 mg tablet - 1200 mg twice daily  -Encouraged family to reach out to [place] pain mg[place] for [place]  [place] -Collaborated with PCP on gapabentin increase - ok with increasing to 1200 mg TID   MDD PHQ not assessed during today's visit -[place] health referral - pt/son had contacted them this am, however [facility] [place] the next [place].  -Patient's son DoTimmothy Sourss currently coordinating with Sister on best facilities to use for psych and pain needs. I provided patient with a list of providers accepting medicare insurance and new patients. Through [facility] if wanting to further consider referrals -Pain - MyFrancis GainesMD  - [place] [id number]- Phone [phone]  - ErKennieth FrancoisMD - [facility] [address] [id number]- [phone] above provider information to help with [place] care coordination -reviewed discharge medications at length, answered questions related to antibiotic therapy, gabapentin, cymbalta -Will f/u early may to ensure coordination needs are met  Patient Goals/Self-Care Activities . Patient will:  - collaborate with provider on medication access solutions -reach out with any medication/care coordination related questions  Follow Up Plan: PCP visit 03/18/2021 Medication Assistance: None required.  Patient affirms current coverage meets needs.  Patient's [facility] is:  [place]: [phone]: [phone]  The patient verbalized understanding of instructions provided today and agreed to receive a mailed copy of patient instruction and/or educational materials. Telephone follow up appointment with  pharmacy team member scheduled for: See next appointment with "Care Management Staff" under  "What's Next" below.

## 2021-03-18 ENCOUNTER — Other Ambulatory Visit: Payer: Self-pay

## 2021-03-18 ENCOUNTER — Encounter: Payer: Self-pay | Admitting: Family Medicine

## 2021-03-18 ENCOUNTER — Ambulatory Visit (INDEPENDENT_AMBULATORY_CARE_PROVIDER_SITE_OTHER): Payer: Medicare Other | Admitting: Family Medicine

## 2021-03-18 VITALS — BP 110/66 | HR 93 | Temp 97.8°F | Wt 192.6 lb

## 2021-03-18 DIAGNOSIS — R45851 Suicidal ideations: Secondary | ICD-10-CM

## 2021-03-18 DIAGNOSIS — F332 Major depressive disorder, recurrent severe without psychotic features: Secondary | ICD-10-CM | POA: Diagnosis not present

## 2021-03-18 DIAGNOSIS — N3000 Acute cystitis without hematuria: Secondary | ICD-10-CM

## 2021-03-18 DIAGNOSIS — G8929 Other chronic pain: Secondary | ICD-10-CM | POA: Diagnosis not present

## 2021-03-18 DIAGNOSIS — E1142 Type 2 diabetes mellitus with diabetic polyneuropathy: Secondary | ICD-10-CM

## 2021-03-18 DIAGNOSIS — M545 Low back pain, unspecified: Secondary | ICD-10-CM

## 2021-03-18 DIAGNOSIS — T1491XA Suicide attempt, initial encounter: Secondary | ICD-10-CM | POA: Diagnosis not present

## 2021-03-18 MED ORDER — PROMETHAZINE HCL 25 MG PO TABS: 25.0000 mg | ORAL_TABLET | Freq: Four times a day (QID) | ORAL | 1 refills | Status: DC | PRN

## 2021-03-18 MED ORDER — DULOXETINE HCL 60 MG PO CPEP: 60.0000 mg | ORAL_CAPSULE | Freq: Every day | ORAL | 5 refills | Status: DC

## 2021-03-18 MED ORDER — GABAPENTIN 600 MG PO TABS: 1200.0000 mg | ORAL_TABLET | Freq: Three times a day (TID) | ORAL | Status: DC

## 2021-03-18 NOTE — Progress Notes (Signed)
Subjective  CC:  Chief Complaint  Patient presents with  . Suicide Attempt    Overdosed on pain medication because of horrible body pains. Son is with patient today, their plan is to move patient to [place] so he and his sister can take over all care    HPI: Marilyn Myers is a 70 y.o. female who presents to the office today to address the problems listed above in the chief complaint.  Hospital follow-up: I reviewed all hospital notes that are currently available.  Reviewed labs.  Briefly, 70 year old with chronic pain due to peripheral neuropathy managed by pain center on chronic opioids had a suicide attempt with an opioid (old opiods left over from her husband) overdose.  She was evaluated inER and then by behavioral health after IVC. BH cleared her.  She was not admitted.  She was found to be hypokalemic and had an active UTI and a positive COVID test at the time of admission.  She is on antibiotics for her UTI (keflex qid, causing nausea).  Of note, she had a ureteral stent placed back in April.  All Opioids have been stopped.  Gabapentin dose was increased to try to help manage her pain.  Cymbalta was also started for her depression.  Hoping this could also help her neuropathy pain.  She has been managed by [facility] pain management sinceJuly 2020.  She has never been referred to neurology.  She failed Lyrica in the past for pain control.  She has a remote history of depression but has not currently been treated for this.  I cannot see behavioral health notes. She complains of chronic back pain, and hands/feet pain, neuropathic. Also c/o joint pain in shoulders and knees. She admits, she has not discussed the worsening symptoms with me. She reports pain has been uncontrolled for at least 6 weeks.   No red hot or swollen joints. No fevers or URI sxs. Denies chronic depressive sxs.   She is here today with son who is caring for her. The plan is for her to relocate to [place] to live with her  daughter in [place]. They have located a pain mgt center to accept her.   Assessment  1. Severe episode of recurrent major depressive disorder, without psychotic features ([place])   2. Suicide attempt ([place])   3. Suicidal ideation   4. Diabetic peripheral neuropathy ([place])   5. Acute cystitis without hematuria   6. Chronic midline low back pain without sciatica      Plan   Suicide attempt due to exacerbated depression and chronic severe uncontrolled pain:  No longer with SI. Stopped all opiods. Likely having some w/d pain.   Peripheral neuropathy, presumed diabetic but now in hands as well. Recommend a referral to neurology for further evaluation. Also will get to pain mgt quickly. Increase cymbalta to 60mg  daily and gabapentin 1200 tid.   Keflex 500 bid for cystitis is adequate  Counseling done.   Follow up: moving to [place]. Call if need refills or referrals  Visit date not found  No orders of the defined types were placed in this encounter.  Meds ordered this encounter  Medications  . gabapentin (NEURONTIN) 600 MG tablet    Sig: Take 2 tablets (1,200 mg total) by mouth 3 (three) times daily.  . DULoxetine (CYMBALTA) 60 MG capsule    Sig: Take 1 capsule (60 mg total) by mouth daily.    Dispense:  30 capsule    Refill:  5  .  promethazine (PHENERGAN) 25 MG tablet    Sig: Take 1 tablet (25 mg total) by mouth every 6 (six) hours as needed for nausea or vomiting.    Dispense:  20 tablet    Refill:  1      I reviewed the patients updated PMH, FH, and SocHx.    Patient Active Problem List   Diagnosis Date Noted  . Episode of recurrent major depressive disorder ([place])   . Suicidal ideation   . Suicide attempt ([place])   . Gastroesophageal reflux disease without esophagitis 01/07/2020  . Chronic narcotic use 01/07/2020  . Obesity (BMI 30-39.9) 02/22/2019  . Refusal of statin medication by patient 01/24/2018  . Seasonal allergic rhinitis 01/24/2018  . PVC's (premature ventricular  contractions) 10/08/2016  . Diabetic peripheral neuropathy ([place]) 09/28/2016  . S/P colostomy takedown 07/02/2015  . History of osteopenia 05/19/2015  . Osteopenia 05/19/2015  . Essential hypertension 01/28/2015  . Diverticulitis of large intestine 08/19/2014  . Hydradenitis 08/19/2014  . Nephrolithiasis 08/19/2014  . Obstructive sleep apnea 08/01/2014  . Controlled type 2 diabetes mellitus with diabetic polyneuropathy, without long-term current use of insulin ([place]) 06/10/2014   Current Meds  Medication Sig  . Ascorbic Acid (VITAMIN C PO) Take 1 tablet by mouth at bedtime.  [name] Kitchen BIOTIN PO Take 1 tablet by mouth at bedtime.  . Calcium Carbonate-Vitamin D (CALCIUM-VITAMIN D3 PO) Take 1 tablet by mouth at bedtime.  . cephALEXin (KEFLEX) 500 MG capsule Take 1 capsule (500 mg total) by mouth 4 (four) times daily.  . Cyanocobalamin (VITAMIN B 12 PO) Take 1 tablet by mouth at bedtime.  . diazepam (VALIUM) 5 MG tablet Take 1 tablet (5 mg total) by mouth every 8 (eight) hours as needed for muscle spasms.  [name] Kitchen diltiazem (CARDIZEM CD) 120 MG 24 hr capsule Take 1 capsule (120 mg total) by mouth daily. (Patient taking differently: Take 120 mg by mouth every evening.)  . docusate sodium (COLACE) 100 MG capsule Take 1 capsule (100 mg total) by mouth daily as needed. (Patient taking differently: Take 100 mg by mouth daily as needed for mild constipation.)  . FIBER ADULT GUMMIES PO Take 1 tablet by mouth at bedtime.  . hydrochlorothiazide (HYDRODIURIL) 25 MG tablet Take 1 tablet (25 mg total) by mouth daily. (Patient taking differently: Take 25 mg by mouth every morning.)  . losartan (COZAAR) 100 MG tablet Take 1 tablet (100 mg total) by mouth daily. (Patient taking differently: Take 100 mg by mouth every morning.)  . metFORMIN (GLUCOPHAGE) 1000 MG tablet Take 1 tablet (1,000 mg total) by mouth 2 (two) times daily. (Patient taking differently: Take 1,000 mg by mouth 2 (two) times daily with a meal.)  . Multiple  Vitamins-Minerals (ADULT ONE DAILY GUMMIES) CHEW Chew 1 tablet by mouth at bedtime.  . pantoprazole (PROTONIX) 20 MG tablet TAKE 1 TO 2 TABLETS (20-40 MG TOTAL) BY MOUTH DAILY. (Patient taking differently: Take 20 mg by mouth every evening.)  . promethazine (PHENERGAN) 25 MG tablet Take 1 tablet (25 mg total) by mouth every 6 (six) hours as needed for nausea or vomiting.  . [DISCONTINUED] DULoxetine (CYMBALTA) 20 MG capsule Take 20 mg by mouth 2 (two) times daily.  . [DISCONTINUED] gabapentin (NEURONTIN) 600 MG tablet Take 1 tablet (600 mg total) by mouth 2 (two) times daily. (Patient taking differently: Take 1,200 mg by mouth 3 (three) times daily.)  . [DISCONTINUED] HYDROcodone-acetaminophen (NORCO) 10-325 MG tablet Take 1 tablet by mouth 4 (four) times daily.  Allergies: Patient is allergic to ace inhibitors, codeine, and flomax [tamsulosin hcl]. Family History: Patient family history includes Breast cancer in her mother; Diabetes in her maternal aunt and paternal grandmother; Heart disease in her mother; Renal Disease in her mother. Social History:  Patient  reports that she quit smoking about 24 years ago. Her smoking use included cigarettes. She has a 25.00 pack-year smoking history. She has never used smokeless tobacco. She reports that she does not drink alcohol and does not use drugs.  Review of Systems: Constitutional: Negative for fever malaise or anorexia Cardiovascular: negative for chest pain Respiratory: negative for SOB or persistent cough Gastrointestinal: negative for abdominal pain  Objective  Vitals: BP 110/66   Pulse 93   Temp 97.8 F (36.6 C) (Temporal)   Wt 192 lb 9.6 oz (87.4 kg)   SpO2 98%   BMI 30.62 kg/m  General: no acute distress , A&Ox3 Psych: flat affect, fair insight, normal speech, slightly sedated HEENT: PEERL, conjunctiva normal, neck is supple Cardiovascular:  RRR without murmur or gallop.  Respiratory:  Good breath sounds bilaterally, CTAB with  normal respiratory effort Skin:  Warm, no rashes  Lab Results  Component Value Date   WBC 12.3 (H) 03/11/2021   HGB 11.8 (L) 03/11/2021   HCT 36.5 03/11/2021   MCV 91.0 03/11/2021   PLT 303 03/11/2021   Lab Results  Component Value Date   CREATININE 1.01 (H) 03/11/2021   BUN 25 (H) 03/11/2021   NA 139 03/11/2021   K 3.0 (L) 03/11/2021   CL 101 03/11/2021   CO2 20 (L) 03/11/2021   Lab Results  Component Value Date   ALT 24 03/11/2021   AST 22 03/11/2021   ALKPHOS 84 03/11/2021   BILITOT 1.3 (H) 03/11/2021      Commons side effects, risks, benefits, and alternatives for medications and treatment plan prescribed today were discussed, and the patient expressed understanding of the given instructions. Patient is instructed to call or message via MyChart if he/she has any questions or concerns regarding our treatment plan. No barriers to understanding were identified. We discussed Red Flag symptoms and signs in detail. Patient expressed understanding regarding what to do in case of urgent or emergency type symptoms.   Medication list was reconciled, printed and provided to the patient in AVS. Patient instructions and summary information was reviewed with the patient as documented in the AVS. This note was prepared with assistance of Dragon voice recognition software. Occasional wrong-word or sound-a-like substitutions may have occurred due to the inherent limitations of voice recognition software  This visit occurred during the SARS-CoV-2 public health emergency.  Safety protocols were in place, including screening questions prior to the visit, additional usage of staff PPE, and extensive cleaning of exam room while observing appropriate contact time as indicated for disinfecting solutions.

## 2021-03-18 NOTE — Patient Instructions (Signed)
I will place a referral to a new pain management center in [place].  Please increase the cymbalta to 60mg  daily.  Take the gabapentin 3 times per day.  Decrease the keflex to twice a day.  I've ordered cymbalta and phenergan for you today.

## 2021-03-20 ENCOUNTER — Telehealth: Payer: Self-pay

## 2021-03-20 NOTE — Telephone Encounter (Signed)
Patients son called and wanted to check on the referral for pain management to [facility] in [place] fax number [phone].

## 2021-03-22 ENCOUNTER — Encounter: Payer: Self-pay | Admitting: Family Medicine

## 2021-03-23 ENCOUNTER — Other Ambulatory Visit: Payer: Self-pay

## 2021-03-23 DIAGNOSIS — E1142 Type 2 diabetes mellitus with diabetic polyneuropathy: Secondary | ICD-10-CM

## 2021-03-23 DIAGNOSIS — N281 Cyst of kidney, acquired: Secondary | ICD-10-CM | POA: Diagnosis not present

## 2021-03-23 DIAGNOSIS — N200 Calculus of kidney: Secondary | ICD-10-CM | POA: Diagnosis not present

## 2021-03-23 NOTE — Telephone Encounter (Signed)
FYI

## 2021-03-24 ENCOUNTER — Other Ambulatory Visit: Payer: Self-pay

## 2021-03-24 DIAGNOSIS — R45851 Suicidal ideations: Secondary | ICD-10-CM

## 2021-03-24 DIAGNOSIS — T1491XA Suicide attempt, initial encounter: Secondary | ICD-10-CM

## 2021-03-24 DIAGNOSIS — F332 Major depressive disorder, recurrent severe without psychotic features: Secondary | ICD-10-CM

## 2021-03-24 NOTE — Telephone Encounter (Signed)
FYI

## 2021-03-30 DIAGNOSIS — F32A Depression, unspecified: Secondary | ICD-10-CM | POA: Diagnosis not present

## 2021-03-30 DIAGNOSIS — Z5181 Encounter for therapeutic drug level monitoring: Secondary | ICD-10-CM | POA: Diagnosis not present

## 2021-03-30 DIAGNOSIS — E1142 Type 2 diabetes mellitus with diabetic polyneuropathy: Secondary | ICD-10-CM | POA: Diagnosis not present

## 2021-03-30 DIAGNOSIS — Z9151 Personal history of suicidal behavior: Secondary | ICD-10-CM | POA: Diagnosis not present

## 2021-03-30 DIAGNOSIS — Z79891 Long term (current) use of opiate analgesic: Secondary | ICD-10-CM | POA: Diagnosis not present

## 2021-03-30 DIAGNOSIS — Z9884 Bariatric surgery status: Secondary | ICD-10-CM | POA: Diagnosis not present

## 2021-04-14 DIAGNOSIS — F32A Depression, unspecified: Secondary | ICD-10-CM | POA: Diagnosis not present

## 2021-04-14 DIAGNOSIS — E1142 Type 2 diabetes mellitus with diabetic polyneuropathy: Secondary | ICD-10-CM | POA: Diagnosis not present

## 2021-04-14 DIAGNOSIS — Z5181 Encounter for therapeutic drug level monitoring: Secondary | ICD-10-CM | POA: Diagnosis not present

## 2021-04-14 DIAGNOSIS — G894 Chronic pain syndrome: Secondary | ICD-10-CM | POA: Diagnosis not present

## 2021-04-21 ENCOUNTER — Telehealth: Payer: Self-pay

## 2021-04-21 ENCOUNTER — Other Ambulatory Visit: Payer: Self-pay

## 2021-04-21 MED ORDER — LOSARTAN POTASSIUM 100 MG PO TABS: 100.0000 mg | ORAL_TABLET | Freq: Every day | ORAL | 3 refills | Status: DC

## 2021-04-21 MED ORDER — HYDROCHLOROTHIAZIDE 25 MG PO TABS: 25.0000 mg | ORAL_TABLET | Freq: Every day | ORAL | 3 refills | Status: DC

## 2021-04-21 MED ORDER — PANTOPRAZOLE SODIUM 20 MG PO TBEC: DELAYED_RELEASE_TABLET | ORAL | 1 refills | Status: DC

## 2021-04-21 MED ORDER — METFORMIN HCL 1000 MG PO TABS: 1000.0000 mg | ORAL_TABLET | Freq: Two times a day (BID) | ORAL | 3 refills | Status: DC

## 2021-04-21 MED ORDER — DULOXETINE HCL 60 MG PO CPEP: 60.0000 mg | ORAL_CAPSULE | Freq: Every day | ORAL | 5 refills | Status: DC

## 2021-04-21 MED ORDER — DILTIAZEM HCL ER COATED BEADS 120 MG PO CP24: 120.0000 mg | ORAL_CAPSULE | Freq: Every day | ORAL | 3 refills | Status: DC

## 2021-04-21 NOTE — Telephone Encounter (Signed)
Medications sent

## 2021-04-21 NOTE — Telephone Encounter (Signed)
MEDICATION: Pt needs refill on ALL medications except for gabapentin.    PHARMACY: [facility] on [address] in [place], [place].  Comments:   **Let patient know to contact pharmacy at the end of the day to make sure medication is ready. **  ** Please notify patient to allow 48-72 hours to process**  **Encourage patient to contact the pharmacy for refills or they can request refills through [facility]**

## 2021-05-12 DIAGNOSIS — G894 Chronic pain syndrome: Secondary | ICD-10-CM | POA: Diagnosis not present

## 2021-05-12 DIAGNOSIS — F32A Depression, unspecified: Secondary | ICD-10-CM | POA: Diagnosis not present

## 2021-05-12 DIAGNOSIS — E1142 Type 2 diabetes mellitus with diabetic polyneuropathy: Secondary | ICD-10-CM | POA: Diagnosis not present

## 2021-05-13 ENCOUNTER — Telehealth: Payer: Self-pay

## 2021-05-13 NOTE — Chronic Care Management (AMB) (Signed)
Chronic Care Management Pharmacy Assistant   Name: Marilyn Myers  MRN: [id number] DOB: 08-20-1951  Reason for Encounter: General Adherence Call  Recent office visits:  03/18/21- Marilyn Chang, MD- seen for severe episode of recurrent major depressive disorder, without psychotic features, started promethazine 25 mg q6h prn, increased duloxetine from 20 mg bid to 60 mg daily, increased gabapentin from 600 mg bid to 1200 mg tid  Recent consult visits:  No visits noted  Hospital visits:  None in previous 6 months  Medications: Outpatient Encounter Medications as of 05/13/2021  Medication Sig   Ascorbic Acid (VITAMIN C PO) Take 1 tablet by mouth at bedtime.   BIOTIN PO Take 1 tablet by mouth at bedtime.   Calcium Carbonate-Vitamin D (CALCIUM-VITAMIN D3 PO) Take 1 tablet by mouth at bedtime.   cephALEXin (KEFLEX) 500 MG capsule Take 1 capsule (500 mg total) by mouth 4 (four) times daily.   Cyanocobalamin (VITAMIN B 12 PO) Take 1 tablet by mouth at bedtime.   diazepam (VALIUM) 5 MG tablet Take 1 tablet (5 mg total) by mouth every 8 (eight) hours as needed for muscle spasms.   diltiazem (CARDIZEM CD) 120 MG 24 hr capsule Take 1 capsule (120 mg total) by mouth daily.   DULoxetine (CYMBALTA) 60 MG capsule Take 1 capsule (60 mg total) by mouth daily.   FIBER ADULT GUMMIES PO Take 1 tablet by mouth at bedtime.   gabapentin (NEURONTIN) 600 MG tablet Take 2 tablets (1,200 mg total) by mouth 3 (three) times daily.   hydrochlorothiazide (HYDRODIURIL) 25 MG tablet Take 1 tablet (25 mg total) by mouth daily.   losartan (COZAAR) 100 MG tablet Take 1 tablet (100 mg total) by mouth daily.   metFORMIN (GLUCOPHAGE) 1000 MG tablet Take 1 tablet (1,000 mg total) by mouth 2 (two) times daily.   Multiple Vitamins-Minerals (ADULT ONE DAILY GUMMIES) CHEW Chew 1 tablet by mouth at bedtime.   pantoprazole (PROTONIX) 20 MG tablet TAKE 1 TO 2 TABLETS (20-40 MG TOTAL) BY MOUTH DAILY.   promethazine (PHENERGAN)  25 MG tablet Take 1 tablet (25 mg total) by mouth every 6 (six) hours as needed for nausea or vomiting.   No facility-administered encounter medications on file as of 05/13/2021.   Recent Relevant Labs: Lab Results  Component Value Date/Time   HGBA1C 7.2 (H) 01/16/2021 11:07 AM   HGBA1C 6.7 (A) 04/30/2020 04:44 PM   HGBA1C 6.7 (A) 01/07/2020 09:48 AM   HGBA1C 6.4 11/06/2018 12:00 AM    Kidney Function Lab Results  Component Value Date/Time   CREATININE 1.01 (H) 03/11/2021 04:45 PM   CREATININE 1.13 (H) 02/17/2021 09:49 AM   GFR 52.19 (L) 01/16/2021 11:07 AM   GFRNONAA 60 (L) 03/11/2021 04:45 PM   GFRAA 77 (L) 07/26/2014 11:09 AM    I spoke with Marilyn Myers this afternoon and she has been doing well. She has been staying with her daughter in [place] while she is dealing with her neuropathy. She was seen by Dr. Lovena Le in pain management yesterday (06/21) and was prescribed Pregabalin (Lyrica) 100 mg. She is hoping that using this instead of the gabapentin will provide longer relief for the pain she has. Otherwise she has no complaints and is enjoying her time in [place], but is excited to get back to [place] at the end of July. She filled all of her medications prior to leaving so she will not be needing any fills soon.   Star Rating Drugs: Losartan 100 mg-  90 DS last filled 04/21/21  Wilford Sports CPA, [place]

## 2021-05-20 NOTE — Chronic Care Management (AMB) (Signed)
    Chronic Care Management Pharmacy Assistant   Name: Marilyn Myers  MRN: [id number] DOB: 04/29/1951  Reason for Encounter: Chart Review  Medications: Outpatient Encounter Medications as of 05/13/2021  Medication Sig   Ascorbic Acid (VITAMIN C PO) Take 1 tablet by mouth at bedtime.   BIOTIN PO Take 1 tablet by mouth at bedtime.   Calcium Carbonate-Vitamin D (CALCIUM-VITAMIN D3 PO) Take 1 tablet by mouth at bedtime.   cephALEXin (KEFLEX) 500 MG capsule Take 1 capsule (500 mg total) by mouth 4 (four) times daily.   Cyanocobalamin (VITAMIN B 12 PO) Take 1 tablet by mouth at bedtime.   diazepam (VALIUM) 5 MG tablet Take 1 tablet (5 mg total) by mouth every 8 (eight) hours as needed for muscle spasms.   diltiazem (CARDIZEM CD) 120 MG 24 hr capsule Take 1 capsule (120 mg total) by mouth daily.   DULoxetine (CYMBALTA) 60 MG capsule Take 1 capsule (60 mg total) by mouth daily.   FIBER ADULT GUMMIES PO Take 1 tablet by mouth at bedtime.   gabapentin (NEURONTIN) 600 MG tablet Take 2 tablets (1,200 mg total) by mouth 3 (three) times daily.   hydrochlorothiazide (HYDRODIURIL) 25 MG tablet Take 1 tablet (25 mg total) by mouth daily.   losartan (COZAAR) 100 MG tablet Take 1 tablet (100 mg total) by mouth daily.   metFORMIN (GLUCOPHAGE) 1000 MG tablet Take 1 tablet (1,000 mg total) by mouth 2 (two) times daily.   Multiple Vitamins-Minerals (ADULT ONE DAILY GUMMIES) CHEW Chew 1 tablet by mouth at bedtime.   pantoprazole (PROTONIX) 20 MG tablet TAKE 1 TO 2 TABLETS (20-40 MG TOTAL) BY MOUTH DAILY.   promethazine (PHENERGAN) 25 MG tablet Take 1 tablet (25 mg total) by mouth every 6 (six) hours as needed for nausea or vomiting.   No facility-administered encounter medications on file as of 05/13/2021.   Reviewed chart for medication changes and adherence.  No OVs, Consults, or hospital visits since last care coordination call / Pharmacist visit. No medication changes indicated  No gaps in adherence  identified. Patient has follow up scheduled with pharmacy team. No further action required.  Wilford Sports CPA, CMA

## 2021-06-13 ENCOUNTER — Encounter: Payer: Self-pay | Admitting: Family Medicine

## 2021-06-15 NOTE — Telephone Encounter (Signed)
Please see her message.  I had referred her to neurology back in May.  Then it was canceled.   She needs to see neurology and apparently is will be in [place] again.   Please call and clarify: is she moving back here? And can we reopen the referral or need I order again? Dx: peripheral neuropathy.   Thanks, Dr. Jonni Sanger

## 2021-06-16 DIAGNOSIS — G894 Chronic pain syndrome: Secondary | ICD-10-CM | POA: Diagnosis not present

## 2021-06-16 DIAGNOSIS — Z79891 Long term (current) use of opiate analgesic: Secondary | ICD-10-CM | POA: Diagnosis not present

## 2021-06-16 DIAGNOSIS — F32A Depression, unspecified: Secondary | ICD-10-CM | POA: Diagnosis not present

## 2021-06-16 DIAGNOSIS — E1142 Type 2 diabetes mellitus with diabetic polyneuropathy: Secondary | ICD-10-CM | POA: Diagnosis not present

## 2021-07-01 ENCOUNTER — Other Ambulatory Visit: Payer: Self-pay

## 2021-07-01 ENCOUNTER — Ambulatory Visit (INDEPENDENT_AMBULATORY_CARE_PROVIDER_SITE_OTHER): Payer: Medicare Other | Admitting: Family Medicine

## 2021-07-01 ENCOUNTER — Encounter: Payer: Self-pay | Admitting: Family Medicine

## 2021-07-01 ENCOUNTER — Telehealth: Payer: Self-pay | Admitting: Pharmacist

## 2021-07-01 VITALS — BP 116/78 | HR 64 | Temp 97.7°F | Wt 197.0 lb

## 2021-07-01 DIAGNOSIS — F332 Major depressive disorder, recurrent severe without psychotic features: Secondary | ICD-10-CM

## 2021-07-01 DIAGNOSIS — G609 Hereditary and idiopathic neuropathy, unspecified: Secondary | ICD-10-CM

## 2021-07-01 DIAGNOSIS — I10 Essential (primary) hypertension: Secondary | ICD-10-CM

## 2021-07-01 DIAGNOSIS — E559 Vitamin D deficiency, unspecified: Secondary | ICD-10-CM | POA: Diagnosis not present

## 2021-07-01 DIAGNOSIS — Z903 Acquired absence of stomach [part of]: Secondary | ICD-10-CM

## 2021-07-01 DIAGNOSIS — E538 Deficiency of other specified B group vitamins: Secondary | ICD-10-CM

## 2021-07-01 DIAGNOSIS — Z5320 Procedure and treatment not carried out because of patient's decision for unspecified reasons: Secondary | ICD-10-CM

## 2021-07-01 DIAGNOSIS — E1142 Type 2 diabetes mellitus with diabetic polyneuropathy: Secondary | ICD-10-CM | POA: Diagnosis not present

## 2021-07-01 DIAGNOSIS — Z5329 Procedure and treatment not carried out because of patient's decision for other reasons: Secondary | ICD-10-CM

## 2021-07-01 LAB — COMPREHENSIVE METABOLIC PANEL
ALT: 24 U/L (ref 0–35)
AST: 20 U/L (ref 0–37)
Albumin: 3.8 g/dL (ref 3.5–5.2)
Alkaline Phosphatase: 94 U/L (ref 39–117)
BUN: 22 mg/dL (ref 6–23)
CO2: 26 mEq/L (ref 19–32)
Calcium: 9.7 mg/dL (ref 8.4–10.5)
Chloride: 98 mEq/L (ref 96–112)
Creatinine, Ser: 1.19 mg/dL (ref 0.40–1.20)
GFR: 45.79 mL/min — ABNORMAL LOW (ref >60.00)
Glucose, Bld: 126 mg/dL — ABNORMAL HIGH (ref 70–99)
Potassium: 3.8 mEq/L (ref 3.5–5.1)
Sodium: 137 mEq/L (ref 135–145)
Total Bilirubin: 1.1 mg/dL (ref 0.2–1.2)
Total Protein: 7 g/dL (ref 6.0–8.3)

## 2021-07-01 LAB — CBC WITH DIFFERENTIAL/PLATELET
Basophils Absolute: 0 10*3/uL (ref 0.0–0.1)
Basophils Relative: 0.5 % (ref 0.0–3.0)
Eosinophils Absolute: 0.3 10*3/uL (ref 0.0–0.7)
Eosinophils Relative: 4.3 % (ref 0.0–5.0)
HCT: 37.5 % (ref 36.0–46.0)
Hemoglobin: 12.2 g/dL (ref 12.0–15.0)
Lymphocytes Relative: 23.1 % (ref 12.0–46.0)
Lymphs Abs: 1.7 10*3/uL (ref 0.7–4.0)
MCHC: 32.6 g/dL (ref 30.0–36.0)
MCV: 90.6 fl (ref 78.0–100.0)
Monocytes Absolute: 0.7 10*3/uL (ref 0.1–1.0)
Monocytes Relative: 9 % (ref 3.0–12.0)
Neutro Abs: 4.7 10*3/uL (ref 1.4–7.7)
Neutrophils Relative %: 63.1 % (ref 43.0–77.0)
Platelets: 282 10*3/uL (ref 150.0–400.0)
RBC: 4.14 Mil/uL (ref 3.87–5.11)
RDW: 14.4 % (ref 11.5–15.5)
WBC: 7.4 10*3/uL (ref 4.0–10.5)

## 2021-07-01 LAB — HEMOGLOBIN A1C: Hgb A1c MFr Bld: 7.3 % — ABNORMAL HIGH (ref 4.6–6.5)

## 2021-07-01 LAB — VITAMIN D 25 HYDROXY (VIT D DEFICIENCY, FRACTURES): VITD: 51.36 ng/mL (ref 30.00–100.00)

## 2021-07-01 LAB — TSH: TSH: 1.38 u[IU]/mL (ref 0.35–5.50)

## 2021-07-01 LAB — B12 AND FOLATE PANEL
Folate: 11.9 ng/mL (ref >5.9)
Vitamin B-12: 506 pg/mL (ref 211–911)

## 2021-07-01 NOTE — Chronic Care Management (AMB) (Addendum)
    Chronic Care Management Pharmacy Assistant   Name: Marilyn Myers  MRN: [id number] DOB: Apr 12, 1951  Reason for Encounter: General Adherence Call    Recent office visits:  07/01/2021 OV (PCP) Billey Chang, MD; refer to neurology for neuropathy, no medication changes indicated.  Recent consult visits:  None  Hospital visits:  None in previous 6 months  Medications: Outpatient Encounter Medications as of 07/01/2021  Medication Sig   Cyanocobalamin (VITAMIN B 12 PO) Take 1 tablet by mouth at bedtime. (Patient not taking: Reported on 07/01/2021)   diltiazem (CARDIZEM CD) 120 MG 24 hr capsule Take 1 capsule (120 mg total) by mouth daily.   doxepin (SINEQUAN) 10 MG capsule Take 10 mg by mouth. Three tablets at bedtime   DULoxetine (CYMBALTA) 60 MG capsule Take 1 capsule (60 mg total) by mouth daily.   gabapentin (NEURONTIN) 800 MG tablet Take 800 mg by mouth 3 (three) times daily. Two tablets three times a day   hydrochlorothiazide (HYDRODIURIL) 25 MG tablet Take 1 tablet (25 mg total) by mouth daily.   losartan (COZAAR) 100 MG tablet Take 1 tablet (100 mg total) by mouth daily.   metFORMIN (GLUCOPHAGE) 1000 MG tablet Take 1 tablet (1,000 mg total) by mouth 2 (two) times daily.   pantoprazole (PROTONIX) 20 MG tablet TAKE 1 TO 2 TABLETS (20-40 MG TOTAL) BY MOUTH DAILY.   pregabalin (LYRICA) 100 MG capsule Take 100 mg by mouth daily.   No facility-administered encounter medications on file as of 07/01/2021.    Patient Questions: Have you had any problems recently with your health? Patient states she has not had any problems recently with her health.  Have you had any problems with your pharmacy? Patient states she has not had any problems with her pharmacy.  What issues or side effects are you having with your medications? Patient recently lost her prescription of Losartan while on vacation.  What would you like me to pass along to Leata Mouse, CPP for him to help you with?   Patient states she has lost her medication Losartan. (She thinks she lost her medication in [place] while visiting her daughter.) She states she only has 4 tablets left of Losartan. She states she also needs refills on Cymbalta and Gabapentin sent to [facility] on the corner of [address] Patient also wanted to know if she can get a prescription sent for Vitamin B12 as well. (I sent a message to  Dr. Tamela Oddi pool)  What can we do to take care of you better? Patient did not have any suggestions.   Future Appointments  Date Time Provider [place]  09/22/2021 11:00 AM [name], MD [facility]  09/25/2021 11:30 AM Leta Baptist, Earlean Polka, MD [facility] None  12/17/2021  3:15 PM [facility] HEALTH COACH [facility]    Star Rating Drugs: Losartan Potassium 100 mg last filled 05/31/2022090 DS  April D Calhoun, [place] Pharmacist Assistant [phone]   10 minutes spent in review, coordination, and documentation.  Reviewed by: Camrie Milch, PharmD Clinical Pharmacist [phone]

## 2021-07-01 NOTE — Patient Instructions (Signed)
Please return in 3 months for diabetes follow up   I will release your lab results to you on your MyChart account with further instructions. Please reply with any questions.    We will call you with information regarding your referral appointment. Neurology. If you do not hear from [place] within the next 2 weeks, please let me know. It can take 1-2 weeks to get appointments set up with the specialists.    I am glad you are doing better. I am sorry you had that hard time.   If you have any questions or concerns, please don't hesitate to send me a message via MyChart or call the office at [phone]. Thank you for visiting with [place] today! It's our pleasure caring for you.

## 2021-07-01 NOTE — Progress Notes (Signed)
Subjective  CC:  Chief Complaint  Patient presents with   Peripheral Neuropathy    Previously seen by pain clinic in [place], needing referral for neuro   b12 deficiency    Hx of gastric sleeve 2017    HPI: Marilyn Myers is a 70 y.o. female who presents to the office today to address the problems listed above in the chief complaint. Sina returns to [place] to live.  She has been staying with her daughter for the last 3 to 4 months in [place].  Fortunately, her pain management specialist where has been able to help her with her likely idiopathic peripheral neuropathy, hard to control pain symptoms.  He has titrated her dose of gabapentin to 1600 mg 3 times daily along with Cymbalta 60 mg daily and Sinequan nightly for sleep.  She also takes intermittent Lyrica as needed.  She no longer has barely any symptoms.  Sleeping well.  Mood is much improved.  No longer on narcotics.  Still needs to be evaluated by a neurologist for further investigation into the etiology of her neuropathy. History of depression and suicidal attempt: Mood is stabilized.  Continues on Cymbalta 60 mg daily. Diabetes: Taking her medications.  No symptoms of hyperglycemia.  Due for recheck. History of gastric sleeve: Needs evaluation for vitamin deficiencies.  She has stopped her vitamin supplements as of 2 months ago.  History of B12 and vitamin D deficiencies Hypertension: Denies chest pain or shortness of breath.  Reports normal blood pressure.  Tolerates medications.  Assessment  1. Idiopathic peripheral neuropathy   2. Controlled type 2 diabetes mellitus with diabetic polyneuropathy, without long-term current use of insulin ([place])   3. Refusal of statin medication by patient   4. Essential hypertension   5. Severe episode of recurrent major depressive disorder, without psychotic features ([place])   6. H/O gastric sleeve   7. Vitamin B12 deficiency   8. Vitamin D deficiency      Plan  Neuropathy: Possibly  diabetic related but she has not had diabetes for very long and has been well controlled.  Refer to neurology for further investigation.  Continue high-dose gabapentin, Cymbalta and Lyrica as needed at dosages as listed below. Diabetes: Recheck A1c levels.  Clinically controlled.  Continue metformin 1000 twice daily Hypertension is well controlled on hydrochlorothiazide 25 mg daily and diltiazem 125 mg daily.  Check renal function and electrolytes. Depression: Fortunately, now well controlled.  Continue Cymbalta 60 daily Check for vitamin deficiencies given history of gastric sleeve surgery.  Follow up: Return in about 3 months (around 10/01/2021) for follow up of diabetes and hypertension.  12/17/2021 Outpatient Encounter Medications as of 07/01/2021  Medication Sig   diltiazem (CARDIZEM CD) 120 MG 24 hr capsule Take 1 capsule (120 mg total) by mouth daily.   doxepin (SINEQUAN) 10 MG capsule Take 10 mg by mouth. Three tablets at bedtime   DULoxetine (CYMBALTA) 60 MG capsule Take 1 capsule (60 mg total) by mouth daily.   gabapentin (NEURONTIN) 800 MG tablet Take 800 mg by mouth 3 (three) times daily. Two tablets three times a day   hydrochlorothiazide (HYDRODIURIL) 25 MG tablet Take 1 tablet (25 mg total) by mouth daily.   losartan (COZAAR) 100 MG tablet Take 1 tablet (100 mg total) by mouth daily.   metFORMIN (GLUCOPHAGE) 1000 MG tablet Take 1 tablet (1,000 mg total) by mouth 2 (two) times daily.   pantoprazole (PROTONIX) 20 MG tablet TAKE 1 TO 2 TABLETS (20-40 MG  TOTAL) BY MOUTH DAILY.   pregabalin (LYRICA) 100 MG capsule Take 100 mg by mouth daily.   [DISCONTINUED] gabapentin (NEURONTIN) 600 MG tablet Take 2 tablets (1,200 mg total) by mouth 3 (three) times daily.   Cyanocobalamin (VITAMIN B 12 PO) Take 1 tablet by mouth at bedtime. (Patient not taking: Reported on 07/01/2021)   [DISCONTINUED] Ascorbic Acid (VITAMIN C PO) Take 1 tablet by mouth at bedtime. (Patient not taking: Reported on  07/01/2021)   [DISCONTINUED] BIOTIN PO Take 1 tablet by mouth at bedtime. (Patient not taking: Reported on 07/01/2021)   [DISCONTINUED] Calcium Carbonate-Vitamin D (CALCIUM-VITAMIN D3 PO) Take 1 tablet by mouth at bedtime. (Patient not taking: Reported on 07/01/2021)   [DISCONTINUED] cephALEXin (KEFLEX) 500 MG capsule Take 1 capsule (500 mg total) by mouth 4 (four) times daily. (Patient not taking: Reported on 07/01/2021)   [DISCONTINUED] diazepam (VALIUM) 5 MG tablet Take 1 tablet (5 mg total) by mouth every 8 (eight) hours as needed for muscle spasms. (Patient not taking: Reported on 07/01/2021)   [DISCONTINUED] FIBER ADULT GUMMIES PO Take 1 tablet by mouth at bedtime. (Patient not taking: Reported on 07/01/2021)   [DISCONTINUED] Multiple Vitamins-Minerals (ADULT ONE DAILY GUMMIES) CHEW Chew 1 tablet by mouth at bedtime. (Patient not taking: Reported on 07/01/2021)   [DISCONTINUED] promethazine (PHENERGAN) 25 MG tablet Take 1 tablet (25 mg total) by mouth every 6 (six) hours as needed for nausea or vomiting. (Patient not taking: Reported on 07/01/2021)   No facility-administered encounter medications on file as of 07/01/2021.    Orders Placed This Encounter  Procedures   CBC with Differential/Platelet   Comprehensive metabolic panel   123456 and Folate Panel   Hemoglobin A1c   TSH   VITAMIN D 25 Hydroxy (Vit-D Deficiency, Fractures)   Ambulatory referral to Neurology   No orders of the defined types were placed in this encounter.     I reviewed the patients updated PMH, FH, and SocHx.    Patient Active Problem List   Diagnosis Date Noted   Episode of recurrent major depressive disorder ([place])    History of suicide attempt - 2022 opiod overdose    Gastroesophageal reflux disease without esophagitis 01/07/2020   Obesity (BMI 30-39.9) 02/22/2019   Refusal of statin medication by patient 01/24/2018   Seasonal allergic rhinitis 01/24/2018   PVC's (premature ventricular contractions) 10/08/2016    Diabetic peripheral neuropathy ([place]) 09/28/2016   S/P colostomy takedown 07/02/2015   History of osteopenia 05/19/2015   Osteopenia 05/19/2015   Essential hypertension 01/28/2015   Diverticulitis of large intestine 08/19/2014   Hydradenitis 08/19/2014   Nephrolithiasis 08/19/2014   Obstructive sleep apnea 08/01/2014   Controlled type 2 diabetes mellitus with diabetic polyneuropathy, without long-term current use of insulin ([facility]) 06/10/2014   Current Meds  Medication Sig   diltiazem (CARDIZEM CD) 120 MG 24 hr capsule Take 1 capsule (120 mg total) by mouth daily.   doxepin (SINEQUAN) 10 MG capsule Take 10 mg by mouth. Three tablets at bedtime   DULoxetine (CYMBALTA) 60 MG capsule Take 1 capsule (60 mg total) by mouth daily.   gabapentin (NEURONTIN) 800 MG tablet Take 800 mg by mouth 3 (three) times daily. Two tablets three times a day   hydrochlorothiazide (HYDRODIURIL) 25 MG tablet Take 1 tablet (25 mg total) by mouth daily.   losartan (COZAAR) 100 MG tablet Take 1 tablet (100 mg total) by mouth daily.   metFORMIN (GLUCOPHAGE) 1000 MG tablet Take 1 tablet (1,000 mg total) by mouth 2 (  two) times daily.   pantoprazole (PROTONIX) 20 MG tablet TAKE 1 TO 2 TABLETS (20-40 MG TOTAL) BY MOUTH DAILY.   pregabalin (LYRICA) 100 MG capsule Take 100 mg by mouth daily.   [DISCONTINUED] gabapentin (NEURONTIN) 600 MG tablet Take 2 tablets (1,200 mg total) by mouth 3 (three) times daily.    Allergies: Patient is allergic to ace inhibitors, codeine, and flomax [tamsulosin hcl]. Family History: Patient family history includes Breast cancer in her mother; Diabetes in her maternal aunt and paternal grandmother; Heart disease in her mother; Renal Disease in her mother. Social History:  Patient  reports that she quit smoking about 24 years ago. Her smoking use included cigarettes. She has a 25.00 pack-year smoking history. She has never used smokeless tobacco. She reports that she does not drink alcohol and  does not use drugs.  Review of Systems: Constitutional: Negative for fever malaise or anorexia Cardiovascular: negative for chest pain Respiratory: negative for SOB or persistent cough Gastrointestinal: negative for abdominal pain  Objective  Vitals: BP 116/78   Pulse 64   Temp 97.7 F (36.5 C) (Temporal)   Wt 197 lb (89.4 kg)   SpO2 97%   BMI 31.32 kg/m  General: no acute distress , A&Ox3 Foot exam: Normal pulses, no lesions,    Commons side effects, risks, benefits, and alternatives for medications and treatment plan prescribed today were discussed, and the patient expressed understanding of the given instructions. Patient is instructed to call or message via MyChart if he/she has any questions or concerns regarding our treatment plan. No barriers to understanding were identified. We discussed Red Flag symptoms and signs in detail. Patient expressed understanding regarding what to do in case of urgent or emergency type symptoms.  Medication list was reconciled, printed and provided to the patient in AVS. Patient instructions and summary information was reviewed with the patient as documented in the AVS. This note was prepared with assistance of Dragon voice recognition software. Occasional wrong-word or sound-a-like substitutions may have occurred due to the inherent limitations of voice recognition software  This visit occurred during the SARS-CoV-2 public health emergency.  Safety protocols were in place, including screening questions prior to the visit, additional usage of staff PPE, and extensive cleaning of exam room while observing appropriate contact time as indicated for disinfecting solutions.

## 2021-07-13 ENCOUNTER — Telehealth: Payer: Self-pay

## 2021-07-13 NOTE — Telephone Encounter (Signed)
LAST APPOINTMENT DATE:  07/01/21  NEXT APPOINTMENT DATE:09/22/21  MEDICATION: losartan (COZAAR) 100 MG tablet  gabapentin (NEURONTIN) 800 MG tablet  **Pt stated that she needs Xarelto for her feet. It was prescribed by another provider   PHARMACY:[facility] [id number] [place], [place]

## 2021-07-14 ENCOUNTER — Other Ambulatory Visit: Payer: Self-pay

## 2021-07-14 ENCOUNTER — Other Ambulatory Visit: Payer: Self-pay | Admitting: Family Medicine

## 2021-07-14 MED ORDER — LOSARTAN POTASSIUM 100 MG PO TABS: 100.0000 mg | ORAL_TABLET | Freq: Every day | ORAL | 3 refills | Status: DC

## 2021-07-14 MED ORDER — DULOXETINE HCL 60 MG PO CPEP
60.0000 mg | ORAL_CAPSULE | Freq: Every day | ORAL | 5 refills | Status: DC
Start: 2021-07-14 — End: 2021-08-19

## 2021-07-14 MED ORDER — GABAPENTIN 800 MG PO TABS: 1600.0000 mg | ORAL_TABLET | Freq: Three times a day (TID) | ORAL | Status: DC

## 2021-07-14 NOTE — Telephone Encounter (Signed)
Losartan, gabapentin, and cymbalta all sent to pharmacy

## 2021-07-14 NOTE — Telephone Encounter (Signed)
Please advise if Gabapentin is one tablet TID or two tablets TID Also OK to refill Xarelto?

## 2021-07-17 ENCOUNTER — Telehealth: Payer: Self-pay

## 2021-07-17 NOTE — Telephone Encounter (Signed)
LAST APPOINTMENT DATE:  07/01/21  NEXT APPOINTMENT DATE: 09/22/21  MEDICATION:gabapentin (NEURONTIN) 800 MG tablet  PHARMACY:[facility] [id number] - [place], [place] - [address]

## 2021-07-18 ENCOUNTER — Other Ambulatory Visit: Payer: Self-pay | Admitting: Family Medicine

## 2021-07-20 ENCOUNTER — Other Ambulatory Visit: Payer: Self-pay

## 2021-07-20 MED ORDER — GABAPENTIN 800 MG PO TABS
1600.0000 mg | ORAL_TABLET | Freq: Three times a day (TID) | ORAL | 3 refills | Status: DC
Start: 2021-07-20 — End: 2022-04-20

## 2021-07-20 MED ORDER — GABAPENTIN 800 MG PO TABS: 1600.0000 mg | ORAL_TABLET | Freq: Three times a day (TID) | ORAL | Status: DC

## 2021-07-20 NOTE — Telephone Encounter (Signed)
Refill sent to pharmacy.   

## 2021-08-19 ENCOUNTER — Telehealth: Payer: Self-pay

## 2021-08-19 ENCOUNTER — Other Ambulatory Visit: Payer: Self-pay

## 2021-08-19 MED ORDER — DULOXETINE HCL 60 MG PO CPEP: 60.0000 mg | ORAL_CAPSULE | Freq: Every day | ORAL | 5 refills | Status: DC

## 2021-08-19 NOTE — Telephone Encounter (Signed)
Medication has been sent in 

## 2021-08-19 NOTE — Telephone Encounter (Signed)
LAST APPOINTMENT DATE:  07/01/21  NEXT APPOINTMENT DATE: 09/22/21  MEDICATION:DULoxetine (CYMBALTA) 60 MG capsule  PHARMACY: [facility] [id number] - [place], [place] - [address]

## 2021-09-22 ENCOUNTER — Ambulatory Visit (INDEPENDENT_AMBULATORY_CARE_PROVIDER_SITE_OTHER): Payer: Medicare Other | Admitting: Diagnostic Neuroimaging

## 2021-09-22 ENCOUNTER — Ambulatory Visit (INDEPENDENT_AMBULATORY_CARE_PROVIDER_SITE_OTHER): Payer: Medicare Other | Admitting: Family Medicine

## 2021-09-22 ENCOUNTER — Other Ambulatory Visit: Payer: Self-pay

## 2021-09-22 ENCOUNTER — Encounter: Payer: Self-pay | Admitting: Diagnostic Neuroimaging

## 2021-09-22 ENCOUNTER — Encounter: Payer: Self-pay | Admitting: Family Medicine

## 2021-09-22 VITALS — BP 128/72 | HR 97 | Temp 97.3°F | Wt 197.6 lb

## 2021-09-22 VITALS — BP 118/80 | HR 115 | Ht 66.5 in | Wt 198.1 lb

## 2021-09-22 DIAGNOSIS — E1142 Type 2 diabetes mellitus with diabetic polyneuropathy: Secondary | ICD-10-CM | POA: Diagnosis not present

## 2021-09-22 DIAGNOSIS — Z114 Encounter for screening for human immunodeficiency virus [HIV]: Secondary | ICD-10-CM | POA: Diagnosis not present

## 2021-09-22 DIAGNOSIS — Z23 Encounter for immunization: Secondary | ICD-10-CM | POA: Diagnosis not present

## 2021-09-22 DIAGNOSIS — Z7289 Other problems related to lifestyle: Secondary | ICD-10-CM

## 2021-09-22 DIAGNOSIS — I10 Essential (primary) hypertension: Secondary | ICD-10-CM | POA: Diagnosis not present

## 2021-09-22 DIAGNOSIS — G629 Polyneuropathy, unspecified: Secondary | ICD-10-CM | POA: Diagnosis not present

## 2021-09-22 DIAGNOSIS — R8290 Unspecified abnormal findings in urine: Secondary | ICD-10-CM

## 2021-09-22 LAB — POCT URINALYSIS DIPSTICK
Bilirubin, UA: NEGATIVE
Blood, UA: NEGATIVE
Glucose, UA: NEGATIVE
Ketones, UA: NEGATIVE
Nitrite, UA: POSITIVE
Protein, UA: POSITIVE — AB
Spec Grav, UA: 1.015 (ref 1.010–1.025)
Urobilinogen, UA: 0.2 E.U./dL
pH, UA: 6 (ref 5.0–8.0)

## 2021-09-22 LAB — POCT GLYCOSYLATED HEMOGLOBIN (HGB A1C): Hemoglobin A1C: 6.8 % — AB (ref 4.0–5.6)

## 2021-09-22 NOTE — Progress Notes (Signed)
Please call patient: I have reviewed his/her lab results. Urine looks infected. Please order cipro 250mg  po bid x 5  days and notify patient. We will await culture.

## 2021-09-22 NOTE — Patient Instructions (Signed)
Please return in 3 months for your annual complete physical; please come fasting.   Glad you are doing well.   If you have any questions or concerns, please don't hesitate to send me a message via MyChart or call the office at [phone]. Thank you for visiting with [place] today! It's our pleasure caring for you.

## 2021-09-22 NOTE — Progress Notes (Signed)
[facility]  PATIENT: Marilyn Myers DOB: [birth year]  REFERRING CLINICIAN: [name], MD HISTORY FROM: PATIEN T REASON FOR VISIT: NEW CONSULT    HISTORICAL  CHIEF COMPLAINT:  Chief Complaint  Patient presents with   New Patient (Initial Visit)    Rm 7 here for consult worsening neuropathy in bilateral feet and hands. Pt reports sx are equal in each area.     HISTORY OF PRESENT ILLNESS:   70 year old female with neuropathy.  History of diabetes.  Patient has had mild numbness and tingling in the bottom of her toes and feet for 15 to 20 years.  She is also had diabetes for around the same time.  Symptoms were very mild for many years.  However in March 2022 symptoms significantly worsened.  She had very severe pain she was on hydrocodone.  Due to severe pain she had a suicide attempt and was hospitalized.  Following this she saw a pain management specialist who started her on high-dose gabapentin 1600 mg 3 times a day.  Since that time numbness and pain in the feet has significantly improved.  In terms of diabetes her A1c numbers have ranged from 6.1-7.3.  PCP was concerned about alternate causes of neuropathy and made referral to neurology.   REVIEW OF SYSTEMS: Full 14 system review of systems performed and negative with exception of: As per HPI.  ALLERGIES: Allergies  Allergen Reactions   Ace Inhibitors Cough   Codeine Nausea And Vomiting   Flomax [Tamsulosin Hcl] Itching    HOME MEDICATIONS: Outpatient Medications Prior to Visit  Medication Sig Dispense Refill   Cyanocobalamin (VITAMIN B 12 PO) Take 1 tablet by mouth at bedtime.     diltiazem (CARDIZEM CD) 120 MG 24 hr capsule Take 1 capsule (120 mg total) by mouth daily. 90 capsule 3   doxepin (SINEQUAN) 10 MG capsule Take 10 mg by mouth. Three tablets at bedtime     DULoxetine (CYMBALTA) 60 MG capsule Take 1 capsule (60 mg total) by mouth daily. 30 capsule 5   gabapentin (NEURONTIN) 800 MG  tablet Take 2 tablets (1,600 mg total) by mouth 3 (three) times daily. 540 tablet 3   hydrochlorothiazide (HYDRODIURIL) 25 MG tablet Take 1 tablet (25 mg total) by mouth daily. 90 tablet 3   losartan (COZAAR) 100 MG tablet Take 1 tablet (100 mg total) by mouth daily. 90 tablet 3   metFORMIN (GLUCOPHAGE) 1000 MG tablet Take 1 tablet (1,000 mg total) by mouth 2 (two) times daily. 180 tablet 3   pantoprazole (PROTONIX) 20 MG tablet TAKE 1 TO 2 TABLETS (20-40 MG TOTAL) BY MOUTH DAILY. 180 tablet 1   pregabalin (LYRICA) 100 MG capsule Take 100 mg by mouth daily.     No facility-administered medications prior to visit.    PAST MEDICAL HISTORY: Past Medical History:  Diagnosis Date   Anxiety    Diverticulitis of intestine without perforation or abscess    Diverticulosis, sigmoid    GERD (gastroesophageal reflux disease)    History of diverticulitis of colon    09/ 2015  RESOLVED    History of kidney stones    Hypertension    Nephrolithiasis    BILATERAL   OSA on CPAP    no longer uses    Right ureteral stone    Seasonal allergic rhinitis 01/24/2018   Type 2 diabetes mellitus ([place])    type 2     PAST SURGICAL HISTORY: Past Surgical History:  Procedure  Laterality Date   BARIATRIC SURGERY     CERVICAL CONE BIOPSY  1976   COLON RESECTION     colostomy x 6 months   CYSTO/  BILATERAL RETROGRADE PYELOGRAM/  PLACEMENT BILATERAL URETERAL STENTS  07-10-2011   CYSTOSCOPY WITH RETROGRADE PYELOGRAM, URETEROSCOPY AND STENT PLACEMENT Right 10/11/2014   Procedure: CYSTOSCOPY WITH RETROGRADE PYELOGRAM, URETEROSCOPY, right ureteral biopsy, AND STENT PLACEMENT;  Surgeon: Arvil Persons, MD;  Location: [facility];  Service: Urology;  Laterality: Right;   CYSTOSCOPY/RETROGRADE/URETEROSCOPY  10/08/2011   Procedure: CYSTOSCOPY/RETROGRADE/URETEROSCOPY;  Surgeon: Hanley Ben, MD;  Location: [facility];  Service: Urology;  Laterality: N/A;  CYSTOSCOPY, RIGHT  RETROGRADE, RIGHT  URETEROSCOPY HOLMIUM LASER WITH BASKET STONE EXTRACTION AND RIGHT URETERAL STENT PLACEMENT   CYSTOSCOPY/URETEROSCOPY/HOLMIUM LASER/STENT PLACEMENT Right 02/20/2021   Procedure: CYSTOSCOPY RETROGRADE/URETEROSCOPY/HOLMIUM LASER/STENT PLACEMENT/BASKET STONE EXTRACTION;  Surgeon: Janith Lima, MD;  Location: [facility];  Service: Urology;  Laterality: Right;  ONLY NEEDS 30 MIN   EXTRACORPOREAL SHOCK WAVE LITHOTRIPSY  x3  last one 01-24-2012   TUBAL LIGATION  1987   URETEROSCPIC STONE EXTRACTION/ STENT PLACEMENT  left 08-20-2011    FAMILY HISTORY: Family History  Problem Relation Age of Onset   Breast cancer Mother        70   Renal Disease Mother    Heart disease Mother    Diabetes Paternal Grandmother    Diabetes Maternal Aunt     SOCIAL HISTORY: Social History   Socioeconomic History   Marital status: Widowed    Spouse name: Not on file   Number of children: 3   Years of education: Not on file   Highest education level: Not on file  Occupational History   Occupation: Retired   Tobacco Use   Smoking status: Former    Packs/day: 1.00    Years: 25.00    Pack years: 25.00    Types: Cigarettes    Quit date: 10/07/1996    Years since quitting: 24.9   Smokeless tobacco: Never  Vaping Use   Vaping Use: Never used  Substance and Sexual Activity   Alcohol use: Never   Drug use: No   Sexual activity: Never  Other Topics Concern   Not on file  Social History Narrative   In process of selling home and moving to [place] with daughter   Right handed   Caffeine 1/2 cup    Social Determinants of Health   Financial Resource Strain: Low Risk    Difficulty of Paying Living Expenses: Not hard at all  Food Insecurity: No Food Insecurity   Worried About Charity fundraiser in the Last Year: Never true   [place] in the Last Year: Never true  Transportation Needs: No Transportation Needs   Lack of Transportation (Medical): No   Lack of Transportation (Non-Medical): No  Physical  Activity: Inactive   Days of Exercise per Week: 0 days   Minutes of Exercise per Session: 0 min  Stress: No Stress Concern Present   Feeling of Stress : Not at all  Social Connections: Moderately Isolated   Frequency of Communication with Friends and Family: More than three times a week   Frequency of Social Gatherings with Friends and Family: Once a week   Attends Religious Services: More than 4 times per year   Active Member of [employer] or Organizations: No   Attends Archivist Meetings: Never   Marital Status: Widowed  Intimate Partner Violence: Not At Risk  Fear of Current or Ex-Partner: No   Emotionally Abused: No   Physically Abused: No   Sexually Abused: No     PHYSICAL EXAM  GENERAL EXAM/CONSTITUTIONAL: Vitals:  Vitals:   09/22/21 1432  BP: 118/80  Pulse: (!) 115  SpO2: 98%  Weight: 198 lb 2 oz (89.9 kg)  Height: 5' 6.5" (1.689 m)   Body mass index is 31.5 kg/m. Wt Readings from Last 3 Encounters:  09/22/21 198 lb 2 oz (89.9 kg)  09/22/21 197 lb 9.6 oz (89.6 kg)  07/01/21 197 lb (89.4 kg)   Patient is in no distress; well developed, nourished and groomed; neck is supple  CARDIOVASCULAR: Examination of carotid arteries is normal; no carotid bruits Regular rate and rhythm, no murmurs Examination of peripheral vascular system by observation and palpation is normal  EYES: Ophthalmoscopic exam of optic discs and posterior segments is normal; no papilledema or hemorrhages No results found.  MUSCULOSKELETAL: Gait, strength, tone, movements noted in Neurologic exam below  NEUROLOGIC: MENTAL STATUS:  MMSE - Mini Mental State Exam 12/07/2018 12/01/2017  Orientation to time 5 5  Orientation to Place 5 5  Registration 3 3  Attention/ Calculation 5 5  Recall 3 3  Language- name 2 objects 2 2  Language- repeat 1 1  Language- follow 3 step command 3 3  Language- read & follow direction 1 1  Write a sentence 1 1  Copy design 1 1  Total score 30 30    awake, alert, oriented to person, place and time recent and remote memory intact normal attention and concentration language fluent, comprehension intact, naming intact fund of knowledge appropriate  CRANIAL NERVE:  2nd - no papilledema on fundoscopic exam 2nd, 3rd, 4th, 6th - pupils equal and reactive to light, visual fields full to confrontation, extraocular muscles intact, no nystagmus 5th - facial sensation symmetric 7th - facial strength symmetric 8th - hearing intact 9th - palate elevates symmetrically, uvula midline 11th - shoulder shrug symmetric 12th - tongue protrusion midline  MOTOR:  normal bulk and tone, full strength in the BUE, BLE  SENSORY:  normal and symmetric to light touch, pinprick, temperature, vibration; EXCEPT DECR PP IN HANDS AND FEET  COORDINATION:  finger-nose-finger, fine finger movements normal  REFLEXES:  deep tendon reflexes TRACE and symmetric  GAIT/STATION:  narrow based gait; able to walk on toes, heels     DIAGNOSTIC DATA (LABS, IMAGING, TESTING) - I reviewed patient records, labs, notes, testing and imaging myself where available.  Lab Results  Component Value Date   WBC 7.4 07/01/2021   HGB 12.2 07/01/2021   HCT 37.5 07/01/2021   MCV 90.6 07/01/2021   PLT 282.0 07/01/2021      Component Value Date/Time   NA 137 07/01/2021 1015   NA 139 11/06/2018 0000   K 3.8 07/01/2021 1015   CL 98 07/01/2021 1015   CO2 26 07/01/2021 1015   GLUCOSE 126 (H) 07/01/2021 1015   BUN 22 07/01/2021 1015   BUN 18 11/06/2018 0000   CREATININE 1.19 07/01/2021 1015   CALCIUM 9.7 07/01/2021 1015   PROT 7.0 07/01/2021 1015   ALBUMIN 3.8 07/01/2021 1015   AST 20 07/01/2021 1015   ALT 24 07/01/2021 1015   ALKPHOS 94 07/01/2021 1015   BILITOT 1.1 07/01/2021 1015   GFRNONAA 60 (L) 03/11/2021 1645   GFRAA 77 (L) 07/26/2014 1109   Lab Results  Component Value Date   CHOL 197 01/16/2021   HDL 41.60 01/16/2021   LDLCALC  87 11/06/2018    LDLDIRECT 117.0 01/16/2021   TRIG 285.0 (H) 01/16/2021   CHOLHDL 5 01/16/2021   Lab Results  Component Value Date   HGBA1C 6.8 (A) 09/22/2021   Lab Results  Component Value Date   VITAMINB12 506 07/01/2021   Lab Results  Component Value Date   TSH 1.38 07/01/2021       ASSESSMENT AND PLAN  70 y.o. year old female here with history of neuropathy symptoms and diabetes since approximately 2000, but significant worsening of pain in feet and legs in 2022, without corresponding worsening of diabetes.   Dx:  1. Neuropathy   2. Encounter for screening for human immunodeficiency virus (HIV)    3. Other problems related to lifestyle       PLAN:  NEUROPATHY (likely diabetic neuropathy versus diabetic lumbar plexopathies; will check testing to rule out other causes) - doing better on gabapentin 1600mg  three times a day - check neuropathy labs - consider MRI lumbar spine  Orders Placed This Encounter  Procedures   SPEP with IFE   ANA w/Reflex   SSA, SSB   HIV   RPR   ANCA Profile   Return for pending if symptoms worsen or fail to improve, pending test results.    Penni Bombard, MD 72/03/3663, 4:03 PM Certified in Neurology, Neurophysiology and Neuroimaging  [facility] [address], [place], [place] [address] [phone]

## 2021-09-22 NOTE — Progress Notes (Signed)
Subjective  CC:  Chief Complaint  Patient presents with   Follow-up    Diabetes, hypertension    HPI: Marilyn Myers is a 70 y.o. female who presents to the office today for follow up of diabetes and problems listed above in the chief complaint.  Diabetes follow up: Her diabetic control is reported as Improved.  She denies exertional CP or SOB or symptomatic hypoglycemia. Feels well C/o urine odor x 3 months w/o other irritative sxs.  Neuropathy: seeing neuro today - remains well controlled on high dose gaba, cymbalta, and lyrica with sinequan at night.   Wt Readings from Last 3 Encounters:  09/22/21 197 lb 9.6 oz (89.6 kg)  07/01/21 197 lb (89.4 kg)  03/18/21 192 lb 9.6 oz (87.4 kg)    BP Readings from Last 3 Encounters:  09/22/21 128/72  07/01/21 116/78  03/18/21 110/66    Assessment  1. Controlled type 2 diabetes mellitus with diabetic polyneuropathy, without long-term current use of insulin ([place])   2. Need for vaccination   3. Essential hypertension   4. Diabetic peripheral neuropathy ([place])   5. Abnormal urine odor      Plan  Diabetes is currently very well controlled.  HTN: controlled.  F/u with neuro to ensure no other cause for neuropathy Check urine and treat if abnl   Follow up: feb 2023 for cpe and labs. Orders Placed This Encounter  Procedures   Urine Culture   Flu Vaccine QUAD High Dose(Fluad)   POCT HgB A1C   POCT urinalysis dipstick   No orders of the defined types were placed in this encounter.     Immunization History  Administered Date(s) Administered   Fluad Quad(high Dose 65+) 08/27/2019, 09/22/2021   Influenza, High Dose Seasonal PF 08/19/2014, 09/02/2015, 10/08/2016, 09/22/2017, 08/29/2018   Influenza-Unspecified 10/13/2020   PFIZER(Purple Top)SARS-COV-2 Vaccination 12/29/2019, 01/18/2020, 10/13/2020   Pneumococcal Conjugate-13 10/18/2016   Pneumococcal Polysaccharide-23 08/19/2014    Diabetes Related Lab Review: Lab Results   Component Value Date   HGBA1C 6.8 (A) 09/22/2021   HGBA1C 7.3 (H) 07/01/2021   HGBA1C 7.2 (H) 01/16/2021    No results found for: Derl Barrow Lab Results  Component Value Date   CREATININE 1.19 07/01/2021   BUN 22 07/01/2021   NA 137 07/01/2021   K 3.8 07/01/2021   CL 98 07/01/2021   CO2 26 07/01/2021   Lab Results  Component Value Date   CHOL 197 01/16/2021   CHOL 200 08/27/2019   CHOL 184 11/06/2018   Lab Results  Component Value Date   HDL 41.60 01/16/2021   HDL 41.30 08/27/2019   HDL 40 11/06/2018   Lab Results  Component Value Date   LDLCALC 87 11/06/2018   Lab Results  Component Value Date   TRIG 285.0 (H) 01/16/2021   TRIG 392.0 (H) 08/27/2019   TRIG 284 (A) 11/06/2018   Lab Results  Component Value Date   CHOLHDL 5 01/16/2021   CHOLHDL 5 08/27/2019   Lab Results  Component Value Date   LDLDIRECT 117.0 01/16/2021   LDLDIRECT 115.0 08/27/2019   The 10-year ASCVD risk score (Arnett DK, et al., 2019) is: 28.5%   Values used to calculate the score:     Age: 83 years     Sex: Female     Is Non-Hispanic African American: No     Diabetic: Yes     Tobacco smoker: No     Systolic Blood Pressure: 989 mmHg  Is BP treated: Yes     HDL Cholesterol: 41.6 mg/dL     Total Cholesterol: 197 mg/dL I have reviewed the PMH, Fam and Soc history. Patient Active Problem List   Diagnosis Date Noted   Episode of recurrent major depressive disorder ([place])    History of suicide attempt - 2022 opiod overdose    Gastroesophageal reflux disease without esophagitis 01/07/2020   Obesity (BMI 30-39.9) 02/22/2019   Refusal of statin medication by patient 01/24/2018    Husband has problems with this medication for her    Seasonal allergic rhinitis 01/24/2018   PVC's (premature ventricular contractions) 10/08/2016   Diabetic peripheral neuropathy ([place]) 09/28/2016    Failed lyrica    S/P colostomy takedown 07/02/2015   History of osteopenia 05/19/2015     T  = -1.4 Dexa 2016.  Dexa 2019 at GYN: normal at all sites. See records    Osteopenia 05/19/2015    Formatting of this note might be different from the original. T = -1.4 Dexa 2016.    Essential hypertension 01/28/2015   Diverticulitis of large intestine 08/19/2014    Overview:  2015    Hydradenitis 08/19/2014   Nephrolithiasis 08/19/2014    Overview:  Dr. Kellie Simmering    Obstructive sleep apnea 08/01/2014   Controlled type 2 diabetes mellitus with diabetic polyneuropathy, without long-term current use of insulin ([place]) 06/10/2014    Social History: Patient  reports that she quit smoking about 24 years ago. Her smoking use included cigarettes. She has a 25.00 pack-year smoking history. She has never used smokeless tobacco. She reports that she does not drink alcohol and does not use drugs.  Review of Systems: Ophthalmic: negative for eye pain, loss of vision or double vision Cardiovascular: negative for chest pain Respiratory: negative for SOB or persistent cough Gastrointestinal: negative for abdominal pain Genitourinary: negative for dysuria or gross hematuria MSK: negative for foot lesions Neurologic: negative for weakness or gait disturbance  Objective  Vitals: BP 128/72   Pulse 97   Temp (!) 97.3 F (36.3 C) (Temporal)   Wt 197 lb 9.6 oz (89.6 kg)   SpO2 99%   BMI 31.42 kg/m  General: well appearing, no acute distress  Psych:  Alert and oriented, normal mood and affect   Office Visit on 09/22/2021  Component Date Value Ref Range Status   Hemoglobin A1C 09/22/2021 6.8 (A)  4.0 - 5.6 % Final   Color, UA 09/22/2021 yellow   Final   Clarity, UA 09/22/2021 cloudy   Final   Glucose, UA 09/22/2021 Negative  Negative Final   Bilirubin, UA 09/22/2021 neg   Final   Ketones, UA 09/22/2021 neg   Final   Spec Grav, UA 09/22/2021 1.015  1.010 - 1.025 Final   Blood, UA 09/22/2021 neg   Final   pH, UA 09/22/2021 6.0  5.0 - 8.0 Final   Protein, UA 09/22/2021 Positive (A)  Negative  Final   Urobilinogen, UA 09/22/2021 0.2  0.2 or 1.0 E.U./dL Final   Nitrite, UA 09/22/2021 positive   Final   Leukocytes, UA 09/22/2021 Moderate (2+) (A)  Negative Final     Diabetic education: ongoing education regarding chronic disease management for diabetes was given today. We continue to reinforce the ABC's of diabetic management: A1c (<7 or 8 dependent upon patient), tight blood pressure control, and cholesterol management with goal LDL < 100 minimally. We discuss diet strategies, exercise recommendations, medication options and possible side effects. At each visit, we review recommended immunizations and  preventive care recommendations for diabetics and stress that good diabetic control can prevent other problems. See below for this patient's data.   Commons side effects, risks, benefits, and alternatives for medications and treatment plan prescribed today were discussed, and the patient expressed understanding of the given instructions. Patient is instructed to call or message via MyChart if he/she has any questions or concerns regarding our treatment plan. No barriers to understanding were identified. We discussed Red Flag symptoms and signs in detail. Patient expressed understanding regarding what to do in case of urgent or emergency type symptoms.  Medication list was reconciled, printed and provided to the patient in AVS. Patient instructions and summary information was reviewed with the patient as documented in the AVS. This note was prepared with assistance of Dragon voice recognition software. Occasional wrong-word or sound-a-like substitutions may have occurred due to the inherent limitations of voice recognition software  This visit occurred during the SARS-CoV-2 public health emergency.  Safety protocols were in place, including screening questions prior to the visit, additional usage of staff PPE, and extensive cleaning of exam room while observing appropriate contact time as  indicated for disinfecting solutions.

## 2021-09-23 ENCOUNTER — Other Ambulatory Visit: Payer: Self-pay | Admitting: *Deleted

## 2021-09-23 MED ORDER — CIPROFLOXACIN HCL 250 MG PO TABS: 250.0000 mg | ORAL_TABLET | Freq: Two times a day (BID) | ORAL | 0 refills | Status: AC

## 2021-09-24 LAB — URINE CULTURE
MICRO NUMBER:: 12577729
SPECIMEN QUALITY:: ADEQUATE

## 2021-09-24 NOTE — Progress Notes (Signed)
+   UTI sensitive to abx used. No further action needed

## 2021-09-25 ENCOUNTER — Ambulatory Visit: Payer: Medicare Other | Admitting: Diagnostic Neuroimaging

## 2021-09-28 LAB — MULTIPLE MYELOMA PANEL, SERUM
Albumin SerPl Elph-Mcnc: 3.6 g/dL (ref 2.9–4.4)
Albumin/Glob SerPl: 1.1 (ref 0.7–1.7)
Alpha 1: 0.2 g/dL (ref 0.0–0.4)
Alpha2 Glob SerPl Elph-Mcnc: 0.9 g/dL (ref 0.4–1.0)
B-Globulin SerPl Elph-Mcnc: 1.3 g/dL (ref 0.7–1.3)
Gamma Glob SerPl Elph-Mcnc: 1 g/dL (ref 0.4–1.8)
Globulin, Total: 3.4 g/dL (ref 2.2–3.9)
IgA/Immunoglobulin A, Serum: 493 mg/dL — ABNORMAL HIGH (ref 64–422)
IgG (Immunoglobin G), Serum: 1047 mg/dL (ref 586–1602)
IgM (Immunoglobulin M), Srm: 104 mg/dL (ref 26–217)
Total Protein: 7 g/dL (ref 6.0–8.5)

## 2021-09-28 LAB — ANCA PROFILE
Anti-MPO Antibodies: <0.2 units (ref 0.0–0.9)
Anti-PR3 Antibodies: <0.2 units (ref 0.0–0.9)
Atypical pANCA: <1:20 {titer}
C-ANCA: <1:20 {titer}
P-ANCA: <1:20 {titer}

## 2021-09-28 LAB — HIV ANTIBODY (ROUTINE TESTING W REFLEX): HIV Screen 4th Generation wRfx: NONREACTIVE

## 2021-09-28 LAB — SJOGREN'S SYNDROME ANTIBODS(SSA + SSB)
ENA SSA (RO) Ab: <0.2 AI (ref 0.0–0.9)
ENA SSB (LA) Ab: <0.2 AI (ref 0.0–0.9)

## 2021-09-28 LAB — ANA W/REFLEX: ANA Titer 1: NEGATIVE

## 2021-09-28 LAB — RPR: RPR Ser Ql: NONREACTIVE

## 2021-11-02 DIAGNOSIS — E119 Type 2 diabetes mellitus without complications: Secondary | ICD-10-CM | POA: Diagnosis not present

## 2021-11-02 LAB — HM DIABETES EYE EXAM

## 2021-11-05 ENCOUNTER — Encounter: Payer: Self-pay | Admitting: Family Medicine

## 2021-12-09 ENCOUNTER — Ambulatory Visit (INDEPENDENT_AMBULATORY_CARE_PROVIDER_SITE_OTHER): Payer: Medicare Other | Admitting: Family Medicine

## 2021-12-09 ENCOUNTER — Encounter: Payer: Self-pay | Admitting: Family Medicine

## 2021-12-09 ENCOUNTER — Other Ambulatory Visit: Payer: Self-pay

## 2021-12-09 VITALS — BP 122/76 | HR 104 | Temp 97.8°F | Ht 66.0 in | Wt 194.5 lb

## 2021-12-09 DIAGNOSIS — B379 Candidiasis, unspecified: Secondary | ICD-10-CM | POA: Diagnosis not present

## 2021-12-09 MED ORDER — CLOTRIMAZOLE-BETAMETHASONE 1-0.05 % EX CREA: 1.0000 "application " | TOPICAL_CREAM | Freq: Every day | CUTANEOUS | 0 refills | Status: DC

## 2021-12-09 MED ORDER — CLOTRIMAZOLE 1 % EX CREA: 1.0000 "application " | TOPICAL_CREAM | Freq: Two times a day (BID) | CUTANEOUS | 1 refills | Status: DC

## 2021-12-09 NOTE — Patient Instructions (Addendum)
Meds have been sent the the pharmacy You can take tylenol for pain/fevers If worsening symptoms, let us know  Combo-max 2 wks-never on face Plain clotrimazole-as needed  Keep area dry

## 2021-12-09 NOTE — Progress Notes (Signed)
Subjective:     Patient ID: Marilyn Myers, female    DOB: Jun 15, 1951, 71 y.o.   MRN: [id number]  Chief Complaint  Patient presents with   Rash    Rash under breast that started 1 week ago, getting worse Has been using cream she got from a friend, Clotrimazole/betamethasone dipropionate cream 1% 0.05%    HPI Rash under breast for 1 wk.  Was visiting friend in [place] so new soap.   Just under breasts.  Using clotrimazole/betamethasone from friend BID. Helping a lot.   Rash was burning. A lot of bumps. Gets under pannus as well.   Health Maintenance Due  Topic Date Due   Zoster Vaccines- Shingrix (1 of 2) Never done   COVID-19 Vaccine (4 - Booster for [employer] series) 12/08/2020    Past Medical History:  Diagnosis Date   Anxiety    Diverticulitis of intestine without perforation or abscess    Diverticulosis, sigmoid    GERD (gastroesophageal reflux disease)    History of diverticulitis of colon    09/ 2015  RESOLVED    History of kidney stones    Hypertension    Nephrolithiasis    BILATERAL   OSA on CPAP    no longer uses    Right ureteral stone    Seasonal allergic rhinitis 01/24/2018   Type 2 diabetes mellitus ([place])    type 2     Past Surgical History:  Procedure Laterality Date   BARIATRIC SURGERY     CERVICAL CONE BIOPSY  1976   COLON RESECTION     colostomy x 6 months   CYSTO/  BILATERAL RETROGRADE PYELOGRAM/  PLACEMENT BILATERAL URETERAL STENTS  07-10-2011   CYSTOSCOPY WITH RETROGRADE PYELOGRAM, URETEROSCOPY AND STENT PLACEMENT Right 10/11/2014   Procedure: CYSTOSCOPY WITH RETROGRADE PYELOGRAM, URETEROSCOPY, right ureteral biopsy, AND STENT PLACEMENT;  Surgeon: Arvil Persons, MD;  Location: [facility];  Service: Urology;  Laterality: Right;   CYSTOSCOPY/RETROGRADE/URETEROSCOPY  10/08/2011   Procedure: CYSTOSCOPY/RETROGRADE/URETEROSCOPY;  Surgeon: Hanley Ben, MD;  Location: [facility];  Service: Urology;  Laterality: N/A;   CYSTOSCOPY, RIGHT  RETROGRADE, RIGHT URETEROSCOPY HOLMIUM LASER WITH BASKET STONE EXTRACTION AND RIGHT URETERAL STENT PLACEMENT   CYSTOSCOPY/URETEROSCOPY/HOLMIUM LASER/STENT PLACEMENT Right 02/20/2021   Procedure: CYSTOSCOPY RETROGRADE/URETEROSCOPY/HOLMIUM LASER/STENT PLACEMENT/BASKET STONE EXTRACTION;  Surgeon: Janith Lima, MD;  Location: [facility];  Service: Urology;  Laterality: Right;  ONLY NEEDS 30 MIN   EXTRACORPOREAL SHOCK WAVE LITHOTRIPSY  x3  last one 01-24-2012   TUBAL LIGATION  1987   URETEROSCPIC STONE EXTRACTION/ STENT PLACEMENT  left 08-20-2011    Outpatient Medications Prior to Visit  Medication Sig Dispense Refill   Cyanocobalamin (VITAMIN B 12 PO) Take 1 tablet by mouth at bedtime.     diltiazem (CARDIZEM CD) 120 MG 24 hr capsule Take 1 capsule (120 mg total) by mouth daily. 90 capsule 3   doxepin (SINEQUAN) 10 MG capsule Take 10 mg by mouth. Three tablets at bedtime     DULoxetine (CYMBALTA) 60 MG capsule Take 1 capsule (60 mg total) by mouth daily. 30 capsule 5   gabapentin (NEURONTIN) 800 MG tablet Take 2 tablets (1,600 mg total) by mouth 3 (three) times daily. 540 tablet 3   hydrochlorothiazide (HYDRODIURIL) 25 MG tablet Take 1 tablet (25 mg total) by mouth daily. 90 tablet 3   losartan (COZAAR) 100 MG tablet Take 1 tablet (100 mg total) by mouth daily. 90 tablet 3   metFORMIN (GLUCOPHAGE) 1000 MG tablet  Take 1 tablet (1,000 mg total) by mouth 2 (two) times daily. 180 tablet 3   pantoprazole (PROTONIX) 20 MG tablet TAKE 1 TO 2 TABLETS (20-40 MG TOTAL) BY MOUTH DAILY. 180 tablet 1   pregabalin (LYRICA) 100 MG capsule Take 100 mg by mouth daily.     No facility-administered medications prior to visit.    Allergies  Allergen Reactions   Ace Inhibitors Cough   Codeine Nausea And Vomiting   Flomax [Tamsulosin Hcl] Itching   OEH:[id number] except as noted in HPI      Objective:     BP 122/76    Pulse (!) 104    Temp 97.8 F (36.6 C) (Temporal)    Ht 5'  6" (1.676 m)    Wt 194 lb 8 oz (88.2 kg)    SpO2 99%    BMI 31.39 kg/m  Wt Readings from Last 3 Encounters:  12/09/21 194 lb 8 oz (88.2 kg)  09/22/21 198 lb 2 oz (89.9 kg)  09/22/21 197 lb 9.6 oz (89.6 kg)        Gen: WDWN NAD [facility] HEENT: NCAT, conjunctiva not injected, sclera nonicteric EXT:  no edema MSK: no gross abnormalities.  NEURO: A&O x3.  CN II-XII intact.  PSYCH: normal mood. Good eye contact Red, yeasty, irrit rash under breasts and pannus-improving Large scar abd but one area flakey.   Assessment & Plan:   Problem List Items Addressed This Visit   None Visit Diagnoses     Candida albicans infection    -  Primary     Yeast under breast and pannus.  Combo cream max 2 wk.  Plain clotrimazole for other times. Keep dry.  No orders of the defined types were placed in this encounter.   Wellington Hampshire, MD

## 2021-12-17 ENCOUNTER — Ambulatory Visit (INDEPENDENT_AMBULATORY_CARE_PROVIDER_SITE_OTHER): Payer: Medicare Other

## 2021-12-17 ENCOUNTER — Other Ambulatory Visit: Payer: Self-pay

## 2021-12-17 DIAGNOSIS — Z0000 Encounter for general adult medical examination without abnormal findings: Secondary | ICD-10-CM | POA: Diagnosis not present

## 2021-12-17 NOTE — Patient Instructions (Signed)
[name] , Thank you for taking time to come for your Medicare Wellness Visit. I appreciate your ongoing commitment to your health goals. Please review the following plan we discussed and let me know if I can assist you in the future.   Screening recommendations/referrals: Colonoscopy: Done 06/28/18 repeat every 10 years  Mammogram: Pt stated she will make appt  Bone Density: Done 07/04/18 repeat every 2 years  Recommended yearly ophthalmology/optometry visit for glaucoma screening and checkup Recommended yearly dental visit for hygiene and checkup  Vaccinations: Influenza vaccine: Done 09/22/21 repeat every year  Pneumococcal vaccine: Up to date Tdap vaccine: not a candidate  Shingles vaccine: Shingrix discussed. Please contact your pharmacy for coverage information.    Covid-19:Completed 2/6, 2/26, & 10/13/20  Advanced directives: Copies in chart  Conditions/risks identified: start walking more   Next appointment: Follow up in one year for your annual wellness visit    Preventive Care 65 Years and Older, Female Preventive care refers to lifestyle choices and visits with your health care provider that can promote health and wellness. What does preventive care include? A yearly physical exam. This is also called an annual well check. Dental exams once or twice a year. Routine eye exams. Ask your health care provider how often you should have your eyes checked. Personal lifestyle choices, including: Daily care of your teeth and gums. Regular physical activity. Eating a healthy diet. Avoiding tobacco and drug use. Limiting alcohol use. Practicing safe sex. Taking low-dose aspirin every day. Taking vitamin and mineral supplements as recommended by your health care provider. What happens during an annual well check? The services and screenings done by your health care provider during your annual well check will depend on your age, overall health, lifestyle risk factors, and family  history of disease. Counseling  Your health care provider may ask you questions about your: Alcohol use. Tobacco use. Drug use. Emotional well-being. Home and relationship well-being. Sexual activity. Eating habits. History of falls. Memory and ability to understand (cognition). Work and work Statistician. Reproductive health. Screening  You may have the following tests or measurements: Height, weight, and BMI. Blood pressure. Lipid and cholesterol levels. These may be checked every 5 years, or more frequently if you are over 59 years old. Skin check. Lung cancer screening. You may have this screening every year starting at age 83 if you have a 30-pack-year history of smoking and currently smoke or have quit within the past 15 years. Fecal occult blood test (FOBT) of the stool. You may have this test every year starting at age 11. Flexible sigmoidoscopy or colonoscopy. You may have a sigmoidoscopy every 5 years or a colonoscopy every 10 years starting at age 60. Hepatitis C blood test. Hepatitis B blood test. Sexually transmitted disease (STD) testing. Diabetes screening. This is done by checking your blood sugar (glucose) after you have not eaten for a while (fasting). You may have this done every 1-3 years. Bone density scan. This is done to screen for osteoporosis. You may have this done starting at age 37. Mammogram. This may be done every 1-2 years. Talk to your health care provider about how often you should have regular mammograms. Talk with your health care provider about your test results, treatment options, and if necessary, the need for more tests. Vaccines  Your health care provider may recommend certain vaccines, such as: Influenza vaccine. This is recommended every year. Tetanus, diphtheria, and acellular pertussis (Tdap, Td) vaccine. You may need a Td booster every  10 years. Zoster vaccine. You may need this after age 33. Pneumococcal 13-valent conjugate (PCV13)  vaccine. One dose is recommended after age 40. Pneumococcal polysaccharide (PPSV23) vaccine. One dose is recommended after age 40. Talk to your health care provider about which screenings and vaccines you need and how often you need them. This information is not intended to replace advice given to you by your health care provider. Make sure you discuss any questions you have with your health care provider. Document Released: 12/05/2015 Document Revised: 07/28/2016 Document Reviewed: 09/09/2015 Elsevier Interactive Patient Education  2017 [employer] Prevention in the Home Falls can cause injuries. They can happen to people of all ages. There are many things you can do to make your home safe and to help prevent falls. What can I do on the outside of my home? Regularly fix the edges of walkways and driveways and fix any cracks. Remove anything that might make you trip as you walk through a door, such as a raised step or threshold. Trim any bushes or trees on the path to your home. Use bright outdoor lighting. Clear any walking paths of anything that might make someone trip, such as rocks or tools. Regularly check to see if handrails are loose or broken. Make sure that both sides of any steps have handrails. Any raised decks and porches should have guardrails on the edges. Have any leaves, snow, or ice cleared regularly. Use sand or salt on walking paths during winter. Clean up any spills in your garage right away. This includes oil or grease spills. What can I do in the bathroom? Use night lights. Install grab bars by the toilet and in the tub and shower. Do not use towel bars as grab bars. Use non-skid mats or decals in the tub or shower. If you need to sit down in the shower, use a plastic, non-slip stool. Keep the floor dry. Clean up any water that spills on the floor as soon as it happens. Remove soap buildup in the tub or shower regularly. Attach bath mats securely with  double-sided non-slip rug tape. Do not have throw rugs and other things on the floor that can make you trip. What can I do in the bedroom? Use night lights. Make sure that you have a light by your bed that is easy to reach. Do not use any sheets or blankets that are too big for your bed. They should not hang down onto the floor. Have a firm chair that has side arms. You can use this for support while you get dressed. Do not have throw rugs and other things on the floor that can make you trip. What can I do in the kitchen? Clean up any spills right away. Avoid walking on wet floors. Keep items that you use a lot in easy-to-reach places. If you need to reach something above you, use a strong step stool that has a grab bar. Keep electrical cords out of the way. Do not use floor polish or wax that makes floors slippery. If you must use wax, use non-skid floor wax. Do not have throw rugs and other things on the floor that can make you trip. What can I do with my stairs? Do not leave any items on the stairs. Make sure that there are handrails on both sides of the stairs and use them. Fix handrails that are broken or loose. Make sure that handrails are as long as the stairways. Check any carpeting to make  sure that it is firmly attached to the stairs. Fix any carpet that is loose or worn. Avoid having throw rugs at the top or bottom of the stairs. If you do have throw rugs, attach them to the floor with carpet tape. Make sure that you have a light switch at the top of the stairs and the bottom of the stairs. If you do not have them, ask someone to add them for you. What else can I do to help prevent falls? Wear shoes that: Do not have high heels. Have rubber bottoms. Are comfortable and fit you well. Are closed at the toe. Do not wear sandals. If you use a stepladder: Make sure that it is fully opened. Do not climb a closed stepladder. Make sure that both sides of the stepladder are locked  into place. Ask someone to hold it for you, if possible. Clearly mark and make sure that you can see: Any grab bars or handrails. First and last steps. Where the edge of each step is. Use tools that help you move around (mobility aids) if they are needed. These include: Canes. Walkers. Scooters. Crutches. Turn on the lights when you go into a dark area. Replace any light bulbs as soon as they burn out. Set up your furniture so you have a clear path. Avoid moving your furniture around. If any of your floors are uneven, fix them. If there are any pets around you, be aware of where they are. Review your medicines with your doctor. Some medicines can make you feel dizzy. This can increase your chance of falling. Ask your doctor what other things that you can do to help prevent falls. This information is not intended to replace advice given to you by your health care provider. Make sure you discuss any questions you have with your health care provider. Document Released: 09/04/2009 Document Revised: 04/15/2016 Document Reviewed: 12/13/2014 Elsevier Interactive Patient Education  2017 [employer].

## 2021-12-17 NOTE — Progress Notes (Addendum)
Virtual Visit via Telephone Note  I connected with  Marilyn Myers on 12/17/21 at  3:15 PM EST by telephone and verified that I am speaking with the correct person using two identifiers.  Medicare Annual Wellness visit completed telephonically due to Covid-19 pandemic.   Persons participating in this call: This Health Coach and this patient.   Location: Patient: Home Provider: Office    I discussed the limitations, risks, security and privacy concerns of performing an evaluation and management service by telephone and the availability of in person appointments. The patient expressed understanding and agreed to proceed.  Unable to perform video visit due to video visit attempted and failed and/or patient does not have video capability.   Some vital signs may be absent or patient reported.   Willette Brace, LPN   Subjective:   Marilyn Myers is a 71 y.o. female who presents for Medicare Annual (Subsequent) preventive examination.  Review of Systems     Cardiac Risk Factors include: advanced age (>63men, >14 women);diabetes mellitus;hypertension;obesity (BMI >30kg/m2)     Objective:    There were no vitals filed for this visit. There is no height or weight on file to calculate BMI.  Advanced Directives 03/11/2021 02/17/2021 01/25/2021 01/22/2021 12/11/2020 12/10/2019 12/07/2018  Does Patient Have a Medical Advance Directive? No No;Yes Yes Yes Yes Yes Yes  Type of Advance Directive - [place];Living will [place];Living will [place];Living will [place];Living will Living will;Healthcare Power of Attorney Living will;Healthcare Power of Attorney  Does patient want to make changes to medical advance directive? - - - - - No - Patient declined -  Copy of [place] in Chart? - - - - No - copy requested No - copy requested No - copy requested  Would patient like information on creating a  medical advance directive? No - Patient declined - - - - - -  Pre-existing out of facility DNR order (yellow form or pink MOST form) - - - - - - -    Current Medications (verified) Outpatient Encounter Medications as of 12/17/2021  Medication Sig   clotrimazole (LOTRIMIN) 1 % cream Apply 1 application topically 2 (two) times daily.   clotrimazole-betamethasone (LOTRISONE) cream Apply 1 application topically daily.   Cyanocobalamin (VITAMIN B 12 PO) Take 1 tablet by mouth at bedtime.   diltiazem (CARDIZEM CD) 120 MG 24 hr capsule Take 1 capsule (120 mg total) by mouth daily.   doxepin (SINEQUAN) 10 MG capsule Take 10 mg by mouth. Three tablets at bedtime   DULoxetine (CYMBALTA) 60 MG capsule Take 1 capsule (60 mg total) by mouth daily.   gabapentin (NEURONTIN) 800 MG tablet Take 2 tablets (1,600 mg total) by mouth 3 (three) times daily.   hydrochlorothiazide (HYDRODIURIL) 25 MG tablet Take 1 tablet (25 mg total) by mouth daily.   losartan (COZAAR) 100 MG tablet Take 1 tablet (100 mg total) by mouth daily.   metFORMIN (GLUCOPHAGE) 1000 MG tablet Take 1 tablet (1,000 mg total) by mouth 2 (two) times daily.   pantoprazole (PROTONIX) 20 MG tablet TAKE 1 TO 2 TABLETS (20-40 MG TOTAL) BY MOUTH DAILY.   [DISCONTINUED] pregabalin (LYRICA) 100 MG capsule Take 100 mg by mouth daily.   No facility-administered encounter medications on file as of 12/17/2021.    Allergies (verified) Ace inhibitors, Codeine, and Flomax [tamsulosin hcl]   History: Past Medical History:  Diagnosis Date   Anxiety  Diverticulitis of intestine without perforation or abscess    Diverticulosis, sigmoid    GERD (gastroesophageal reflux disease)    History of diverticulitis of colon    09/ 2015  RESOLVED    History of kidney stones    Hypertension    Nephrolithiasis    BILATERAL   OSA on CPAP    no longer uses    Right ureteral stone    Seasonal allergic rhinitis 01/24/2018   Type 2 diabetes mellitus ([place])    type  2    Past Surgical History:  Procedure Laterality Date   BARIATRIC SURGERY     CERVICAL CONE BIOPSY  1976   COLON RESECTION     colostomy x 6 months   CYSTO/  BILATERAL RETROGRADE PYELOGRAM/  PLACEMENT BILATERAL URETERAL STENTS  07-10-2011   CYSTOSCOPY WITH RETROGRADE PYELOGRAM, URETEROSCOPY AND STENT PLACEMENT Right 10/11/2014   Procedure: CYSTOSCOPY WITH RETROGRADE PYELOGRAM, URETEROSCOPY, right ureteral biopsy, AND STENT PLACEMENT;  Surgeon: Arvil Persons, MD;  Location: [facility];  Service: Urology;  Laterality: Right;   CYSTOSCOPY/RETROGRADE/URETEROSCOPY  10/08/2011   Procedure: CYSTOSCOPY/RETROGRADE/URETEROSCOPY;  Surgeon: Hanley Ben, MD;  [facility];  Service: Urology;  Laterality: N/A;  CYSTOSCOPY, RIGHT  RETROGRADE, RIGHT URETEROSCOPY HOLMIUM LASER WITH BASKET STONE EXTRACTION AND RIGHT URETERAL STENT PLACEMENT   CYSTOSCOPY/URETEROSCOPY/HOLMIUM LASER/STENT PLACEMENT Right 02/20/2021   Procedure: CYSTOSCOPY RETROGRADE/URETEROSCOPY/HOLMIUM LASER/STENT PLACEMENT/BASKET STONE EXTRACTION;  Surgeon: Janith Lima, MD;  Location: [facility];  Service: Urology;  Laterality: Right;  ONLY NEEDS 30 MIN   EXTRACORPOREAL SHOCK WAVE LITHOTRIPSY  x3  last one 01-24-2012   TUBAL LIGATION  1987   URETEROSCPIC STONE EXTRACTION/ STENT PLACEMENT  left 08-20-2011   Family History  Problem Relation Age of Onset   Breast cancer Mother        64   Renal Disease Mother    Heart disease Mother    Diabetes Paternal Grandmother    Diabetes Maternal Aunt    Social History   Socioeconomic History   Marital status: Widowed    Spouse name: Not on file   Number of children: 3   Years of education: Not on file   Highest education level: Not on file  Occupational History   Occupation: Retired   Tobacco Use   Smoking status: Former    Packs/day: 1.00    Years: 25.00    Pack years: 25.00    Types: Cigarettes    Quit date: 10/07/1996    Years since quitting:  25.2   Smokeless tobacco: Never  Vaping Use   Vaping Use: Never used  Substance and Sexual Activity   Alcohol use: Never   Drug use: No   Sexual activity: Never  Other Topics Concern   Not on file  Social History Narrative   In process of selling home and moving to [place] with daughter   Right handed   Caffeine 1/2 cup    Social Determinants of Health   Financial Resource Strain: Low Risk    Difficulty of Paying Living Expenses: Not hard at all  Food Insecurity: No Food Insecurity   Worried About Charity fundraiser in the Last Year: Never true   [place] in the Last Year: Never true  Transportation Needs: No Transportation Needs   Lack of Transportation (Medical): No   Lack of Transportation (Non-Medical): No  Physical Activity: Inactive   Days of Exercise per Week: 0 days   Minutes of Exercise per Session:  0 min  Stress: No Stress Concern Present   Feeling of Stress : Not at all  Social Connections: Moderately Integrated   Frequency of Communication with Friends and Family: More than three times a week   Frequency of Social Gatherings with Friends and Family: Twice a week   Attends Religious Services: More than 4 times per year   Active Member of [employer] or Organizations: Yes   Attends Archivist Meetings: 1 to 4 times per year   Marital Status: Widowed    Tobacco Counseling Counseling given: Not Answered   Clinical Intake:  Pre-visit preparation completed: Yes  Pain : No/denies pain     BMI - recorded: 31.41 Nutritional Status: BMI > 30  Obese Nutritional Risks: None Diabetes: Yes CBG done?: No Did pt. bring in CBG monitor from home?: No  How often do you need to have someone help you when you read instructions, pamphlets, or other written materials from your doctor or pharmacy?: 1 - Never  Diabetic?Nutrition Risk Assessment:  Has the patient had any N/V/D within the last 2 months?  No  Does the patient have any non-healing wounds?  No   Has the patient had any unintentional weight loss or weight gain?  No   Diabetes:  Is the patient diabetic?  Yes  If diabetic, was a CBG obtained today?  Yes  Did the patient bring in their glucometer from home?  No  How often do you monitor your CBG's? N/A .   Financial Strains and Diabetes Management:  Are you having any financial strains with the device, your supplies or your medication? No .  Does the patient want to be seen by Chronic Care Management for management of their diabetes?  No  Would the patient like to be referred to a Nutritionist or for Diabetic Management?  No   Diabetic Exams:  Diabetic Eye Exam: Completed 11/02/21 Diabetic Foot Exam: Completed 01/16/21   Interpreter Needed?: No  Information entered by :: Charlott Rakes, LPN   Activities of Daily Living In your present state of health, do you have any difficulty performing the following activities: 12/17/2021 02/17/2021  Hearing? N N  Vision? N N  Difficulty concentrating or making decisions? N N  Walking or climbing stairs? N N  Dressing or bathing? N N  Doing errands, shopping? N N  Preparing Food and eating ? N -  Using the Toilet? N -  In the past six months, have you accidently leaked urine? N -  Do you have problems with loss of bowel control? N -  Managing your Medications? N -  Managing your Finances? N -  Housekeeping or managing your Housekeeping? N -  Some recent data might be hidden    Patient Care Team: [name], MD as PCP - General (Family Medicine) Dasher, Jeneen Rinks, MD (Surgery) Princess Bruins, MD as Consulting Physician (Obstetrics and Gynecology) Idolina Primer [facility]) Harriett Sine, MD as Consulting Physician (Dermatology) Roche, Christian Mate, PA-C as Physician Assistant (Pain Medicine) Madelin Rear, [facility] as Pharmacist (Pharmacist)  Indicate any recent Medical Services you may have received from other than [facility] providers in the past year (date may be  approximate).     Assessment:   This is a routine wellness examination for [place].  Hearing/Vision screen Hearing Screening - Comments:: Pt denies any hearing issues  Vision Screening - Comments:: Pt follows up with Dr Alois Cliche for annual eye exams   Dietary issues and exercise activities discussed: Current Exercise  Habits: The patient does not participate in regular exercise at present   Goals Addressed             This Visit's Progress    Patient Stated       Walk more        Depression Screen PHQ 2/9 Scores 12/17/2021 03/18/2021 01/16/2021 12/11/2020 06/27/2020 02/22/2019 12/07/2018  PHQ - 2 Score 0 6 0 0 0 0 0  PHQ- 9 Score - 22 - - - - -    Fall Risk Fall Risk  12/17/2021 01/16/2021 12/11/2020 12/10/2019 02/22/2019  Falls in the past year? 0 0 0 0 0  Number falls in past yr: 0 0 0 0 0  Injury with Fall? 0 0 0 0 0  Risk for fall due to : Impaired balance/gait;Impaired vision - Impaired vision - -  Follow up Falls prevention discussed - Falls prevention discussed Falls evaluation completed;Education provided;Falls prevention discussed Falls evaluation completed    FALL RISK PREVENTION PERTAINING TO THE HOME:  Any stairs in or around the home? Yes  If so, are there any without handrails? No  Home free of loose throw rugs in walkways, pet beds, electrical cords, etc? Yes  Adequate lighting in your home to reduce risk of falls? Yes   ASSISTIVE DEVICES UTILIZED TO PREVENT FALLS:  Life alert? No  Use of a cane, walker or w/c? No  Grab bars in the bathroom? Yes  Shower chair or bench in shower? No  Elevated toilet seat or a handicapped toilet? No   TIMED UP AND GO:  Was the test performed? No .    Cognitive Function: MMSE - Mini Mental State Exam 12/07/2018 12/01/2017  Orientation to time 5 5  Orientation to Place 5 5  Registration 3 3  Attention/ Calculation 5 5  Recall 3 3  Language- name 2 objects 2 2  Language- repeat 1 1  Language- follow 3 step command 3 3   Language- read & follow direction 1 1  Write a sentence 1 1  Copy design 1 1  Total score 30 30     6CIT Screen 12/17/2021 12/11/2020 12/10/2019  What Year? 0 points 0 points 0 points  What month? 0 points 0 points 0 points  What time? 0 points - 0 points  Count back from 20 0 points 0 points 0 points  Months in reverse 0 points 0 points 0 points  Repeat phrase 0 points 0 points 0 points  Total Score 0 - 0    Immunizations Immunization History  Administered Date(s) Administered   Fluad Quad(high Dose 65+) 08/27/2019, 09/22/2021   Influenza, High Dose Seasonal PF 08/19/2014, 09/02/2015, 10/08/2016, 09/22/2017, 08/29/2018   Influenza-Unspecified 10/13/2020   PFIZER(Purple Top)SARS-COV-2 Vaccination 12/29/2019, 01/18/2020, 10/13/2020   Pneumococcal Conjugate-13 10/18/2016   Pneumococcal Polysaccharide-23 08/19/2014      Flu Vaccine status: Up to date  Pneumococcal vaccine status: Up to date  Covid-19 vaccine status: Completed vaccines  Qualifies for Shingles Vaccine? Yes   Zostavax completed No   Shingrix Completed?: No.    Education has been provided regarding the importance of this vaccine. Patient has been advised to call insurance company to determine out of pocket expense if they have not yet received this vaccine. Advised may also receive vaccine at local pharmacy or Health Dept. Verbalized acceptance and understanding.  Screening Tests Health Maintenance  Topic Date Due   Zoster Vaccines- Shingrix (1 of 2) Never done   MAMMOGRAM  05/31/2019   COVID-19 Vaccine (  4 - Booster for [employer] series) 12/08/2020   FOOT EXAM  01/16/2022   HEMOGLOBIN A1C  03/22/2022   OPHTHALMOLOGY EXAM  11/02/2022   DEXA SCAN  07/05/2023   COLONOSCOPY (Pts 45-49yrs Insurance coverage will need to be confirmed)  06/28/2028   Pneumonia Vaccine 31+ Years old  Completed   INFLUENZA VACCINE  Completed   HPV VACCINES  Aged Out   Hepatitis C Screening  Discontinued    Health  Maintenance  Health Maintenance Due  Topic Date Due   Zoster Vaccines- Shingrix (1 of 2) Never done   MAMMOGRAM  05/31/2019   COVID-19 Vaccine (4 - Booster for [place] series) 12/08/2020    Colorectal cancer screening: Type of screening: Colonoscopy. Completed 06/28/18. Repeat every 10 years   Mammogram pt will make appt   Bone Density status: Completed 07/04/18. Results reflect: Bone density results: OSTEOPENIA. Repeat every 2 years.   Additional Screening:  Hepatitis C Screening: does not qualify;  Vision Screening: Recommended annual ophthalmology exams for early detection of glaucoma and other disorders of the eye. Is the patient up to date with their annual eye exam?  Yes  Who is the provider or what is the name of the office in which the patient attends annual eye exams? Dr Idolina Primer If pt is not established with a provider, would they like to be referred to a provider to establish care? No .   Dental Screening: Recommended annual dental exams for proper oral hygiene  Community Resource Referral / Chronic Care Management: CRR required this visit?  No   CCM required this visit?  No      Plan:     I have personally reviewed and noted the following in the patients chart:   Medical and social history Use of alcohol, tobacco or illicit drugs  Current medications and supplements including opioid prescriptions.  Functional ability and status Nutritional status Physical activity Advanced directives List of other physicians Hospitalizations, surgeries, and ER visits in previous 12 months Vitals Screenings to include cognitive, depression, and falls Referrals and appointments  In addition, I have reviewed and discussed with patient certain preventive protocols, quality metrics, and best practice recommendations. A written personalized care plan for preventive services as well as general preventive health recommendations were provided to patient.     Willette Brace,  LPN   [id number]   Nurse Notes: None

## 2021-12-21 DIAGNOSIS — H2513 Age-related nuclear cataract, bilateral: Secondary | ICD-10-CM | POA: Diagnosis not present

## 2021-12-21 DIAGNOSIS — H04123 Dry eye syndrome of bilateral lacrimal glands: Secondary | ICD-10-CM | POA: Diagnosis not present

## 2021-12-21 DIAGNOSIS — H25013 Cortical age-related cataract, bilateral: Secondary | ICD-10-CM | POA: Diagnosis not present

## 2021-12-21 DIAGNOSIS — H2511 Age-related nuclear cataract, right eye: Secondary | ICD-10-CM | POA: Diagnosis not present

## 2021-12-21 DIAGNOSIS — H25043 Posterior subcapsular polar age-related cataract, bilateral: Secondary | ICD-10-CM | POA: Diagnosis not present

## 2021-12-23 ENCOUNTER — Encounter: Payer: Medicare Other | Admitting: Family Medicine

## 2022-01-06 DIAGNOSIS — H04123 Dry eye syndrome of bilateral lacrimal glands: Secondary | ICD-10-CM | POA: Diagnosis not present

## 2022-01-06 DIAGNOSIS — E119 Type 2 diabetes mellitus without complications: Secondary | ICD-10-CM | POA: Diagnosis not present

## 2022-01-06 DIAGNOSIS — H2513 Age-related nuclear cataract, bilateral: Secondary | ICD-10-CM | POA: Diagnosis not present

## 2022-01-06 DIAGNOSIS — H18413 Arcus senilis, bilateral: Secondary | ICD-10-CM | POA: Diagnosis not present

## 2022-01-22 DIAGNOSIS — Z1231 Encounter for screening mammogram for malignant neoplasm of breast: Secondary | ICD-10-CM | POA: Diagnosis not present

## 2022-02-05 ENCOUNTER — Other Ambulatory Visit (HOSPITAL_COMMUNITY)
Admission: RE | Admit: 2022-02-05 | Discharge: 2022-02-05 | Disposition: A | Discharge status: Home / Self Care | Payer: Medicare Other | Source: Ambulatory Visit | Attending: Family Medicine | Admitting: Family Medicine

## 2022-02-05 ENCOUNTER — Encounter: Payer: Self-pay | Admitting: Family Medicine

## 2022-02-05 ENCOUNTER — Ambulatory Visit (INDEPENDENT_AMBULATORY_CARE_PROVIDER_SITE_OTHER): Payer: Medicare Other | Admitting: Family Medicine

## 2022-02-05 VITALS — BP 130/82 | HR 79 | Temp 97.3°F | Ht 66.0 in | Wt 189.8 lb

## 2022-02-05 DIAGNOSIS — E1142 Type 2 diabetes mellitus with diabetic polyneuropathy: Secondary | ICD-10-CM

## 2022-02-05 DIAGNOSIS — R8290 Unspecified abnormal findings in urine: Secondary | ICD-10-CM

## 2022-02-05 DIAGNOSIS — F339 Major depressive disorder, recurrent, unspecified: Secondary | ICD-10-CM

## 2022-02-05 DIAGNOSIS — I10 Essential (primary) hypertension: Secondary | ICD-10-CM | POA: Diagnosis not present

## 2022-02-05 DIAGNOSIS — N898 Other specified noninflammatory disorders of vagina: Secondary | ICD-10-CM | POA: Diagnosis not present

## 2022-02-05 LAB — HEMOGLOBIN A1C: Hgb A1c MFr Bld: 7.5 % — ABNORMAL HIGH (ref 4.6–6.5)

## 2022-02-05 LAB — LIPID PANEL
Cholesterol: 212 mg/dL — ABNORMAL HIGH (ref 0–200)
HDL: 54.3 mg/dL (ref >39.00)
NonHDL: 157.26
Total CHOL/HDL Ratio: 4
Triglycerides: 224 mg/dL — ABNORMAL HIGH (ref 0.0–149.0)
VLDL: 44.8 mg/dL — ABNORMAL HIGH (ref 0.0–40.0)

## 2022-02-05 LAB — COMPREHENSIVE METABOLIC PANEL
ALT: 10 U/L (ref 0–35)
AST: 14 U/L (ref 0–37)
Albumin: 4.1 g/dL (ref 3.5–5.2)
Alkaline Phosphatase: 91 U/L (ref 39–117)
BUN: 22 mg/dL (ref 6–23)
CO2: 30 mEq/L (ref 19–32)
Calcium: 9.9 mg/dL (ref 8.4–10.5)
Chloride: 96 mEq/L (ref 96–112)
Creatinine, Ser: 1.07 mg/dL (ref 0.40–1.20)
GFR: 51.8 mL/min — ABNORMAL LOW (ref >60.00)
Glucose, Bld: 147 mg/dL — ABNORMAL HIGH (ref 70–99)
Potassium: 3 mEq/L — ABNORMAL LOW (ref 3.5–5.1)
Sodium: 137 mEq/L (ref 135–145)
Total Bilirubin: 0.8 mg/dL (ref 0.2–1.2)
Total Protein: 7.6 g/dL (ref 6.0–8.3)

## 2022-02-05 LAB — MICROALBUMIN / CREATININE URINE RATIO
Creatinine,U: 98.9 mg/dL
Microalb Creat Ratio: 7.6 mg/g (ref 0.0–30.0)
Microalb, Ur: 7.5 mg/dL — ABNORMAL HIGH (ref 0.0–1.9)

## 2022-02-05 LAB — CBC WITH DIFFERENTIAL/PLATELET
Basophils Absolute: 0 10*3/uL (ref 0.0–0.1)
Basophils Relative: 0.5 % (ref 0.0–3.0)
Eosinophils Absolute: 0.5 10*3/uL (ref 0.0–0.7)
Eosinophils Relative: 4.7 % (ref 0.0–5.0)
HCT: 39.8 % (ref 36.0–46.0)
Hemoglobin: 13.1 g/dL (ref 12.0–15.0)
Lymphocytes Relative: 20.6 % (ref 12.0–46.0)
Lymphs Abs: 2 10*3/uL (ref 0.7–4.0)
MCHC: 33 g/dL (ref 30.0–36.0)
MCV: 88.2 fl (ref 78.0–100.0)
Monocytes Absolute: 0.9 10*3/uL (ref 0.1–1.0)
Monocytes Relative: 8.9 % (ref 3.0–12.0)
Neutro Abs: 6.3 10*3/uL (ref 1.4–7.7)
Neutrophils Relative %: 65.3 % (ref 43.0–77.0)
Platelets: 317 10*3/uL (ref 150.0–400.0)
RBC: 4.51 Mil/uL (ref 3.87–5.11)
RDW: 15.9 % — ABNORMAL HIGH (ref 11.5–15.5)
WBC: 9.7 10*3/uL (ref 4.0–10.5)

## 2022-02-05 LAB — LDL CHOLESTEROL, DIRECT: Direct LDL: 144 mg/dL

## 2022-02-05 LAB — TSH: TSH: 1.51 u[IU]/mL (ref 0.35–5.50)

## 2022-02-05 NOTE — Progress Notes (Signed)
?Subjective  ?Chief Complaint  ?Patient presents with  ? Annual Exam  ?  Pt is fasting today  ? Hypertension  ? Diverticulitis  ? Gastroesophageal Reflux  ? Diabetes  ? ? ?HPI: Marilyn Myers is a 71 y.o. female who presents to [place] at [facility] today for a Female Wellness Visit. She also has the concerns and/or needs as listed above in the chief complaint. These will be addressed in addition to the Health Maintenance Visit.  ? ?Wellness Visit: annual visit with health maintenance review and exam without Pap ? ?Health maintenance: Screens are current.  Declines Shingrix.  Overall doing well ?Chronic disease f/u and/or acute problem visit: (deemed necessary to be done in addition to the wellness visit): ?Diabetes: Control is now elevated.  No symptoms of hyperglycemia.  Will recommend adding glp 1. Severe diabetic polyneuropathy is well controlled on high-dose gabapentin. ?Hypertension: Well-controlled.  Taking losartan 100 and HCTZ 25 daily.[name] Kitchen  No chest pain ?Mood is well controlled on Cymbalta no adverse effects ?Complains of vaginal odor abnormal smelling in the urine.  She says she suffered this for all of her life.  Has been to GYN.  Not currently sexually active.  No irritative urine symptoms ? ?Lab Results  ?Component Value Date  ? HGBA1C 7.5 (H) 02/05/2022  ? HGBA1C 6.8 (A) 09/22/2021  ? HGBA1C 7.3 (H) 07/01/2021  ?  ? ?Assessment  ?1. Controlled type 2 diabetes mellitus with diabetic polyneuropathy, without long-term current use of insulin ([place])   ?2. Diabetic peripheral neuropathy ([place])   ?3. Essential hypertension   ?4. Major depression, recurrent, chronic ([place])   ?5. Vaginal odor   ?6. Abnormal urine odor   ? ?  ?Plan  ?Female Wellness Visit: ?Age appropriate Health Maintenance and Prevention measures were discussed with patient. Included topics are cancer screening recommendations, ways to keep healthy (see AVS) including dietary and exercise recommendations, regular eye and dental  care, use of seat belts, and avoidance of moderate alcohol use and tobacco use.  ?BMI: discussed patient's BMI and encouraged positive lifestyle modifications to help get to or maintain a target BMI. ?HM needs and immunizations were addressed and ordered. See below for orders. See HM and immunization section for updates. ?Routine labs and screening tests ordered including cmp, cbc and lipids where appropriate. ?Discussed recommendations regarding Vit D and calcium supplementation (see AVS) ? ?Chronic disease management visit and/or acute problem visit: ?Diabetes, well controlled: Continue metformin twice daily.  Could consider GLP-1 for weight loss. ?Continue high-dose gabapentin for severe peripheral neuropathy pain.  Now very well controlled.  No chronic pain medications anymore. ?Depression with history of suicide attempt: Well-controlled on Cymbalta.  Mood is good now that pain is controlled ?Hypertension is stable, continue losartan/HCTZ. ?Check urine and vaginal swab for infection.  Treat based on results. ? ?Follow up: 3 months to recheck diabetes ?Orders Placed This Encounter  ?Procedures  ? Urine Culture  ? CBC with Differential/Platelet  ? Comprehensive metabolic panel  ? Hemoglobin A1c  ? Lipid panel  ? TSH  ? Microalbumin / creatinine urine ratio  ? LDL cholesterol, direct  ? ?No orders of the defined types were placed in this encounter. ? ?  ? ?Body mass index is 30.63 kg/m?. ?Wt Readings from Last 3 Encounters:  ?02/05/22 189 lb 12.8 oz (86.1 kg)  ?12/09/21 194 lb 8 oz (88.2 kg)  ?09/22/21 198 lb 2 oz (89.9 kg)  ? ? ? ?Patient Active Problem List  ?  Diagnosis Date Noted  ? Major depression, recurrent, chronic ([place])   ? History of suicide attempt - 2022 opiod overdose   ? Gastroesophageal reflux disease without esophagitis 01/07/2020  ? Obesity (BMI 30-39.9) 02/22/2019  ? Refusal of statin medication by patient 01/24/2018  ?  Husband has problems with this medication for her ?  ? Seasonal allergic  rhinitis 01/24/2018  ? PVC's (premature ventricular contractions) 10/08/2016  ? Diabetic peripheral neuropathy ([place]) 09/28/2016  ?  Failed lyrica ?  ? S/P colostomy takedown 07/02/2015  ? History of osteopenia 05/19/2015  ?   ?T = -1.4 Dexa 2016.  ?Dexa 2019 at GYN: normal at all sites. See records ?  ? Essential hypertension 01/28/2015  ? Diverticulitis of large intestine 08/19/2014  ?  Overview:  ?2015 ?  ? Hydradenitis 08/19/2014  ? Nephrolithiasis 08/19/2014  ?  Overview:  ?Dr. Kellie Simmering ?  ? Obstructive sleep apnea 08/01/2014  ? Controlled type 2 diabetes mellitus with diabetic polyneuropathy, without long-term current use of insulin ([place]) 06/10/2014  ? ?Health Maintenance  ?Topic Date Due  ? FOOT EXAM  01/16/2022  ? COVID-19 Vaccine (4 - Booster for [place] series) 02/21/2022 (Originally 12/08/2020)  ? Zoster Vaccines- Shingrix (1 of 2) 05/08/2022 (Originally 05/01/1970)  ? HEMOGLOBIN A1C  08/08/2022  ? OPHTHALMOLOGY EXAM  11/02/2022  ? MAMMOGRAM  01/24/2023  ? DEXA SCAN  07/05/2023  ? COLONOSCOPY (Pts 45-29yr Insurance coverage will need to be confirmed)  06/28/2028  ? Pneumonia Vaccine 73 Years old  Completed  ? INFLUENZA VACCINE  Completed  ? HPV VACCINES  Aged Out  ? Hepatitis C Screening  Discontinued  ? ?Immunization History  ?Administered Date(s) Administered  ? Fluad Quad(high Dose 65+) 08/27/2019, 09/22/2021  ? Influenza, High Dose Seasonal PF 08/19/2014, 09/02/2015, 10/08/2016, 09/22/2017, 08/29/2018  ? Influenza-Unspecified 10/13/2020  ? PFIZER(Purple Top)SARS-COV-2 Vaccination 12/29/2019, 01/18/2020, 10/13/2020  ? Pneumococcal Conjugate-13 10/18/2016  ? Pneumococcal Polysaccharide-23 08/19/2014  ? ?We updated and reviewed the patient's past history in detail and it is documented below. ?Allergies: ?Patient is allergic to ace inhibitors, codeine, and flomax [tamsulosin hcl]. ?Past Medical History ?Patient  has a past medical history of Anxiety, Diverticulitis of intestine without perforation or abscess,  Diverticulosis, sigmoid, GERD (gastroesophageal reflux disease), History of diverticulitis of colon, History of kidney stones, Hypertension, Nephrolithiasis, OSA on CPAP, Right ureteral stone, Seasonal allergic rhinitis (01/24/2018), and Type 2 diabetes mellitus ([place]. ?Past Surgical History ?Patient  has a past surgical history that includes Cervical cone biopsy (1976); Extracorporeal shock wave lithotripsy (x3  last one 01-24-2012); Tubal ligation (1987); Cystoscopy/retrograde/ureteroscopy (10/08/2011); URETEROSCPIC STONE EXTRACTION/ STENT PLACEMENT (left 08-20-2011); CYSTO/  BILATERAL RETROGRADE PYELOGRAM/  PLACEMENT BILATERAL URETERAL STENTS (07-10-2011); Cystoscopy with retrograde pyelogram, ureteroscopy and stent placement (Right, 10/11/2014); Colon resection; Bariatric Surgery; and Cystoscopy/ureteroscopy/holmium laser/stent placement (Right, 02/20/2021). ?Family History: ?Patient family history includes Breast cancer in her mother; Diabetes in her maternal aunt and paternal grandmother; Heart disease in her mother; Renal Disease in her mother. ?Social History:  ?Patient  reports that she quit smoking about 25 years ago. Her smoking use included cigarettes. She has a 25.00 pack-year smoking history. She has never used smokeless tobacco. She reports that she does not drink alcohol and does not use drugs. ? ?Review of Systems: ?Constitutional: negative for fever or malaise ?Ophthalmic: negative for photophobia, double vision or loss of vision ?Cardiovascular: negative for chest pain, dyspnea on exertion, or new LE swelling ?Respiratory: negative for SOB or persistent cough ?Gastrointestinal: negative for abdominal pain, change in  bowel habits or melena ?Genitourinary: negative for dysuria or gross hematuria, no abnormal uterine bleeding or disharge ?Musculoskeletal: negative for new gait disturbance or muscular weakness ?Integumentary: negative for new or persistent rashes, no breast lumps ?Neurological: negative  for TIA or stroke symptoms ?Psychiatric: negative for SI or delusions ?Allergic/Immunologic: negative for hives ? ?Patient Care Team  ?  Relationship Specialty Notifications Start End  ?[name], MD

## 2022-02-05 NOTE — Patient Instructions (Signed)
Please return in 3 months for diabetes follow up   I will release your lab results to you on your MyChart account with further instructions. You may see the results before I do, but when I review them I will send you a message with my report or have my assistant call you if things need to be discussed. Please reply to my message with any questions. Thank you!   If you have any questions or concerns, please don't hesitate to send me a message via MyChart or call the office at [phone]. Thank you for visiting with us today! It's our pleasure caring for you.  

## 2022-02-08 ENCOUNTER — Encounter: Payer: Self-pay | Admitting: Family Medicine

## 2022-02-08 LAB — CERVICOVAGINAL ANCILLARY ONLY
Bacterial Vaginitis (gardnerella): NEGATIVE
Candida Glabrata: NEGATIVE
Candida Vaginitis: NEGATIVE
Comment: NEGATIVE
Comment: NEGATIVE
Comment: NEGATIVE
Comment: NEGATIVE
Trichomonas: NEGATIVE

## 2022-02-08 LAB — URINE CULTURE
MICRO NUMBER:: 13145586
SPECIMEN QUALITY:: ADEQUATE

## 2022-02-09 ENCOUNTER — Other Ambulatory Visit: Payer: Self-pay

## 2022-02-09 DIAGNOSIS — F339 Major depressive disorder, recurrent, unspecified: Secondary | ICD-10-CM

## 2022-02-09 DIAGNOSIS — K219 Gastro-esophageal reflux disease without esophagitis: Secondary | ICD-10-CM

## 2022-02-09 MED ORDER — PANTOPRAZOLE SODIUM 20 MG PO TBEC: DELAYED_RELEASE_TABLET | ORAL | 3 refills | Status: DC

## 2022-02-09 MED ORDER — DULOXETINE HCL 60 MG PO CPEP: 60.0000 mg | ORAL_CAPSULE | Freq: Every day | ORAL | 3 refills | Status: DC

## 2022-02-10 ENCOUNTER — Encounter: Payer: Self-pay | Admitting: Family Medicine

## 2022-02-10 MED ORDER — OZEMPIC (0.25 OR 0.5 MG/DOSE) 2 MG/1.5ML ~~LOC~~ SOPN: PEN_INJECTOR | SUBCUTANEOUS | 0 refills | Status: DC

## 2022-02-10 MED ORDER — AMOXICILLIN 875 MG PO TABS: 875.0000 mg | ORAL_TABLET | Freq: Two times a day (BID) | ORAL | 0 refills | Status: AC

## 2022-02-10 MED ORDER — POTASSIUM CHLORIDE CRYS ER 20 MEQ PO TBCR: 20.0000 meq | EXTENDED_RELEASE_TABLET | Freq: Every day | ORAL | 3 refills | Status: DC

## 2022-02-10 MED ORDER — EZETIMIBE 10 MG PO TABS: 10.0000 mg | ORAL_TABLET | Freq: Every day | ORAL | 3 refills | Status: DC

## 2022-02-10 NOTE — Progress Notes (Signed)
Please call patient: I have reviewed his/her lab results. There are multiple things that need to be addressed. ?First, the urine is infected and her symptom is the cloudy urine with odor. I have ordered antibiotics.  ?2.diabetes and cholesterol levels are uncontrolled. A1c is 7.5 and LDL is 144. I recommend adding ozempic weekly injections as we discussed and starting zetia to help lower the cholesterol since she declines taking statins.  ?3. Potassium is low due to the hctz bp medication. Can add daily potassium supplements. I have ordered all medications.  ?4. There is no vaginal infection.

## 2022-02-10 NOTE — Addendum Note (Signed)
Addended by: Billey Chang on: 02/10/2022 03:58 PM ? ? Modules accepted: Orders ? ?

## 2022-02-12 NOTE — Telephone Encounter (Signed)
Spoke with pt regarding lab results/recommendations. Pt understands all the instructions. ?

## 2022-02-12 NOTE — Telephone Encounter (Signed)
Pt is asking to speak with you again. Please call back when you can.  ?

## 2022-02-17 ENCOUNTER — Telehealth: Payer: Self-pay | Admitting: Family Medicine

## 2022-02-17 ENCOUNTER — Telehealth: Payer: Self-pay | Admitting: Pharmacist

## 2022-02-17 NOTE — Telephone Encounter (Signed)
Patient called wanting to speak to [facility]- said she had just called.  ?

## 2022-02-17 NOTE — Progress Notes (Signed)
? ? ?Chronic Care Management ?Pharmacy Assistant  ? ?Name: Marilyn Myers  MRN: [id number] DOB: 1951/08/04 ? ?Reason for Encounter: General Adherence Call ?  ? ?Recent office visits:  ?02/10/2022 Patient Message, unable to afford Ozempic, alternative/cheaper version would be Glipizide. ? ?02/05/2022 OV (PCP) [name], MD; First, the urine is infected and her symptom is the cloudy urine with odor. I have ordered antibiotics.  ?2.diabetes and cholesterol levels are uncontrolled. A1c is 7.5 and LDL is 144. I recommend adding ozempic weekly injections as we discussed and starting zetia to help lower the cholesterol since she declines taking statins.  ?3. Potassium is low due to the hctz bp medication. Can add daily potassium supplements. I have ordered all medications.  ? ?12/09/2021 OV (Family Medicine) Tawnya Crook, MD; Yeast under breast and pannus.  Combo cream max 2 wk.  Plain clotrimazole for other times. Keep dry. ? ?09/22/2021 OV (PCP) [name], MD; no medication changes indicated. ? ?Recent consult visits:  ?None ? ?Hospital visits:  ?None in previous 6 months ? ?Medications: ?Outpatient Encounter Medications as of 02/17/2022  ?Medication Sig  ? amoxicillin (AMOXIL) 875 MG tablet Take 1 tablet (875 mg total) by mouth 2 (two) times daily for 7 days.  ? clotrimazole (LOTRIMIN) 1 % cream Apply 1 application topically 2 (two) times daily.  ? clotrimazole-betamethasone (LOTRISONE) cream Apply 1 application topically daily.  ? Cyanocobalamin (VITAMIN B 12 PO) Take 1 tablet by mouth at bedtime.  ? diltiazem (CARDIZEM CD) 120 MG 24 hr capsule Take 1 capsule (120 mg total) by mouth daily.  ? doxepin (SINEQUAN) 10 MG capsule Take 10 mg by mouth. Three tablets at bedtime  ? DULoxetine (CYMBALTA) 60 MG capsule Take 1 capsule (60 mg total) by mouth daily.  ? ezetimibe (ZETIA) 10 MG tablet Take 1 tablet (10 mg total) by mouth daily.  ? gabapentin (NEURONTIN) 800 MG tablet Take 2 tablets (1,600 mg total) by  mouth 3 (three) times daily.  ? hydrochlorothiazide (HYDRODIURIL) 25 MG tablet Take 1 tablet (25 mg total) by mouth daily.  ? losartan (COZAAR) 100 MG tablet Take 1 tablet (100 mg total) by mouth daily.  ? metFORMIN (GLUCOPHAGE) 1000 MG tablet Take 1 tablet (1,000 mg total) by mouth 2 (two) times daily.  ? pantoprazole (PROTONIX) 20 MG tablet TAKE 1 TO 2 TABLETS (20-40 MG TOTAL) BY MOUTH DAILY.  ? potassium chloride SA (KLOR-CON M) 20 MEQ tablet Take 1 tablet (20 mEq total) by mouth daily.  ? Semaglutide,0.25 or 0.'5MG'$ /DOS, (OZEMPIC, 0.25 OR 0.5 MG/DOSE,) 2 MG/1.5ML SOPN Inject 0.25 mg into the skin once a week for 28 days, THEN 0.5 mg once a week for 14 days.  ? ?No facility-administered encounter medications on file as of 02/17/2022.  ? ?Patient Questions: ?Have you had any problems recently with your health? ?Patient states she has not had any recent problems with her health. ? ?Have you had any problems with your pharmacy? ?Patient states she has not had any problems with her pharmacy. ? ?What issues or side effects are you having with your medications? ?Patient states medication Ozempic is too expensive. She would like to apply for a patient assistance program. ? ?What would you like me to pass along to Leata Mouse, CPP for him to help you with?  ?Patient does not have anything for me to pass along at this time. ? ?What can we do to take care of you better? ?Patient does not have any suggestions. She  is happy with her current level of care. ? ?Care Gaps: ?Medicare Annual Wellness: Completed 12/17/2021 ?Ophthalmology Exam: Next due on 11/02/2022 ?Foot Exam: Overdue since 01/16/2022 ?Hemoglobin A1C: 7.5% on 02/05/2022 ?Colonoscopy: Next due on 06/28/2028 ?Dexa Scan: Next due on 07/05/2023 ?Mammogram: Next due on 01/24/2023 ? ?Future Appointments  ?Date Time Provider [place]  ?02/22/2022  9:30 AM [name], MD [facility]  ?05/12/2022  3:00 PM [name], MD [facility]  ?12/23/2022  3:15 PM [facility]  HEALTH COACH [facility]  ? ? ?Star Rating Drugs: ?Losartan 100 mg last filled 01/11/2022 90 DS ? ?April D Calhoun, [place] ?Clinical Pharmacist Assistant ?[phone]  ?

## 2022-02-18 NOTE — Telephone Encounter (Signed)
Please see lab result message.  

## 2022-02-19 ENCOUNTER — Telehealth: Payer: Self-pay | Admitting: Pharmacist

## 2022-02-19 NOTE — Progress Notes (Signed)
? ? ?  Chronic Care Management ?Pharmacy Assistant  ? ? ?Name: Marilyn Myers  MRN: [id number] DOB: 1951/10/09 ? ? ?Reason for Encounter: PAP for Ozempic ? ? ?PAP form initiated for Ozempic. Will be mailed to patient to complete patient portion and return to prescribing MD office for final portion to be completed by MD and faxed in for patient for processing. Patient will update on the outcome of their application once received.  ? ? ?Liza Showfety, CCMA ?Clinical Pharmacist Assistant  ?[phone] ? ?  ? ? ? ? ?

## 2022-02-22 ENCOUNTER — Ambulatory Visit (INDEPENDENT_AMBULATORY_CARE_PROVIDER_SITE_OTHER): Payer: Medicare Other | Admitting: Family Medicine

## 2022-02-22 ENCOUNTER — Other Ambulatory Visit: Payer: Self-pay

## 2022-02-22 ENCOUNTER — Other Ambulatory Visit: Payer: Self-pay | Admitting: Family Medicine

## 2022-02-22 ENCOUNTER — Encounter: Payer: Self-pay | Admitting: Family Medicine

## 2022-02-22 VITALS — BP 103/72 | HR 85 | Temp 97.2°F | Ht 66.0 in | Wt 191.4 lb

## 2022-02-22 DIAGNOSIS — E1165 Type 2 diabetes mellitus with hyperglycemia: Secondary | ICD-10-CM

## 2022-02-22 DIAGNOSIS — N3000 Acute cystitis without hematuria: Secondary | ICD-10-CM

## 2022-02-22 DIAGNOSIS — E876 Hypokalemia: Secondary | ICD-10-CM | POA: Diagnosis not present

## 2022-02-22 DIAGNOSIS — E782 Mixed hyperlipidemia: Secondary | ICD-10-CM | POA: Diagnosis not present

## 2022-02-22 DIAGNOSIS — I10 Essential (primary) hypertension: Secondary | ICD-10-CM | POA: Diagnosis not present

## 2022-02-22 LAB — BASIC METABOLIC PANEL
BUN: 26 mg/dL — ABNORMAL HIGH (ref 6–23)
CO2: 27 mEq/L (ref 19–32)
Calcium: 9.7 mg/dL (ref 8.4–10.5)
Chloride: 98 mEq/L (ref 96–112)
Creatinine, Ser: 1.01 mg/dL (ref 0.40–1.20)
GFR: 55.5 mL/min — ABNORMAL LOW (ref >60.00)
Glucose, Bld: 125 mg/dL — ABNORMAL HIGH (ref 70–99)
Potassium: 4 mEq/L (ref 3.5–5.1)
Sodium: 136 mEq/L (ref 135–145)

## 2022-02-22 MED ORDER — FLUCONAZOLE 150 MG PO TABS: ORAL_TABLET | ORAL | 0 refills | Status: DC

## 2022-02-22 MED ORDER — METFORMIN HCL 1000 MG PO TABS: 1000.0000 mg | ORAL_TABLET | Freq: Two times a day (BID) | ORAL | 3 refills | Status: DC

## 2022-02-22 MED ORDER — LOSARTAN POTASSIUM-HCTZ 100-25 MG PO TABS: 1.0000 | ORAL_TABLET | Freq: Every day | ORAL | 3 refills | Status: DC

## 2022-02-22 NOTE — Patient Instructions (Signed)
Please follow up as scheduled for your next visit with me: 05/12/2022  ? ?I will send you your results.  ? ?Combine your blood pressure pills to hyzaar once daily. I have printed the new prescription to fill when ready.  ? ?Eat better; less fruit. More protein sources, grains and veggies.  ? ?If you have any questions or concerns, please don't hesitate to send me a message via MyChart or call the office at [phone]. Thank you for visiting with [place] today! It's our pleasure caring for you.  ?

## 2022-02-22 NOTE — Addendum Note (Signed)
Addended by: Billey Chang on: 02/22/2022 10:29 AM ? ? Modules accepted: Orders ? ?

## 2022-02-22 NOTE — Progress Notes (Signed)
? ?Subjective  ?CC:  ?Chief Complaint  ?Patient presents with  ? Results  ?  She would like to discuss. ?  ? Diabetes  ? Hyperlipidemia  ? Vaginitis  ?  Pt complains of symptoms since taking ABX for urine infection. She also complains of odor.   ? ? ?HPI: Marilyn Myers is a 70 y.o. female who presents to the office today for follow up of diabetes and problems listed above in the chief complaint.  ?Please see last note and lab results.  Diabetes results was A1c of 7.5.  She reports that she is eating lots more fruit including grapes, cutie oranges, and apples.  Has not been eating much protein.  Weight is stable.  Ozempic was very expensive.  She has not yet picked it up.  She continues on metformin twice a day. ?Hyperlipidemia: Patient refuses statin but did start Zetia.  She is tolerating this well. ?Acute cystitis treated with antibiotics.  Patient states symptoms have improved however urine remains cloudy.  She has a chronic Museum/gallery curator.  No dysuria.  No fevers or flank pain. ?She takes losartan 100 and HCTZ 25 daily.  Potassium was low.  We have begun supplementation.  She is tolerating this well.  Blood pressures controlled. ? ?Wt Readings from Last 3 Encounters:  ?02/22/22 191 lb 6.4 oz (86.8 kg)  ?02/05/22 189 lb 12.8 oz (86.1 kg)  ?12/09/21 194 lb 8 oz (88.2 kg)  ? ? ?BP Readings from Last 3 Encounters:  ?02/05/22 130/82  ?12/09/21 122/76  ?09/22/21 118/80  ? ? ?Assessment  ?1. Uncontrolled type 2 diabetes mellitus with hyperglycemia ([place])   ?2. Mixed hyperlipidemia   ?3. Acute cystitis without hematuria   ?4. Hypokalemia   ?5. Essential hypertension   ? ?  ?Plan  ?Diabetes is currently marginally controlled.  Likely related to dietary changes.  Continue metformin 1000 twice daily and adjust diet.  Discussed healthier protein choices, increasing protein and grains and berries.  Eating less food.  We will recheck in 3 months.  We will hold off on Ozempic for now ?HLD: Treat with Zetia. ?Hypertension: We will  combine blood pressure pills back to Hyzaar 100/25 daily.  Continue potassium supplementation and recheck renal function and potassium today. ?History of acute cystitis: Recheck urine culture today. ? ?Follow up: No follow-ups on file.[name] Kitchen ?Orders Placed This Encounter  ?Procedures  ? Urine Culture  ? Basic metabolic panel  ? ?Meds ordered this encounter  ?Medications  ? losartan-hydrochlorothiazide (HYZAAR) 100-25 MG tablet  ?  Sig: Take 1 tablet by mouth daily.  ?  Dispense:  90 tablet  ?  Refill:  3  ? ?  ? ?Immunization History  ?Administered Date(s) Administered  ? Fluad Quad(high Dose 65+) 08/27/2019, 09/22/2021  ? Influenza, High Dose Seasonal PF 08/19/2014, 09/02/2015, 10/08/2016, 09/22/2017, 08/29/2018  ? Influenza-Unspecified 10/13/2020  ? PFIZER(Purple Top)SARS-COV-2 Vaccination 12/29/2019, 01/18/2020, 10/13/2020  ? Pneumococcal Conjugate-13 10/18/2016  ? Pneumococcal Polysaccharide-23 08/19/2014  ? ? ?Diabetes Related Lab Review: ?Lab Results  ?Component Value Date  ? HGBA1C 7.5 (H) 02/05/2022  ? HGBA1C 6.8 (A) 09/22/2021  ? HGBA1C 7.3 (H) 07/01/2021  ?  ?Lab Results  ?Component Value Date  ? MICROALBUR 7.5 (H) 02/05/2022  ? ?Lab Results  ?Component Value Date  ? CREATININE 1.07 02/05/2022  ? BUN 22 02/05/2022  ? NA 137 02/05/2022  ? K 3.0 (L) 02/05/2022  ? CL 96 02/05/2022  ? CO2 30 02/05/2022  ? ?Lab Results  ?Component Value Date  ?  CHOL 212 (H) 02/05/2022  ? CHOL 197 01/16/2021  ? CHOL 200 08/27/2019  ? ?Lab Results  ?Component Value Date  ? HDL 54.30 02/05/2022  ? HDL 41.60 01/16/2021  ? HDL 41.30 08/27/2019  ? ?Lab Results  ?Component Value Date  ? [place] 87 11/06/2018  ? ?Lab Results  ?Component Value Date  ? TRIG 224.0 (H) 02/05/2022  ? TRIG 285.0 (H) 01/16/2021  ? TRIG 392.0 (H) 08/27/2019  ? ?Lab Results  ?Component Value Date  ? CHOLHDL 4 02/05/2022  ? CHOLHDL 5 01/16/2021  ? CHOLHDL 5 08/27/2019  ? ?Lab Results  ?Component Value Date  ? LDLDIRECT 144.0 02/05/2022  ? LDLDIRECT 117.0 01/16/2021   ? LDLDIRECT 115.0 08/27/2019  ? ? ? ?The 10-year ASCVD risk score (Arnett DK, et al., 2019) is: 28.9% ?  Values used to calculate the score: ?    Age: 79 years ?    Sex: Female ?    Is Non-Hispanic African American: No ?    Diabetic: Yes ?    Tobacco smoker: No ?    Systolic Blood Pressure: 710 mmHg ?    Is BP treated: Yes ?    HDL Cholesterol: 54.3 mg/dL ?    Total Cholesterol: 212 mg/dL ?I have reviewed the [place], Fam and Soc history. ?Patient Active Problem List  ? Diagnosis Date Noted  ? Major depression, recurrent, chronic ([place])   ? History of suicide attempt - 2022 opiod overdose   ? Gastroesophageal reflux disease without esophagitis 01/07/2020  ? Obesity (BMI 30-39.9) 02/22/2019  ? Refusal of statin medication by patient 01/24/2018  ?  Husband has problems with this medication for her ?  ? Seasonal allergic rhinitis 01/24/2018  ? PVC's (premature ventricular contractions) 10/08/2016  ? Diabetic peripheral neuropathy ([place]) 09/28/2016  ?  Failed lyrica ?  ? S/P colostomy takedown 07/02/2015  ? History of osteopenia 05/19/2015  ?   ?T = -1.4 Dexa 2016.  ?Dexa 2019 at GYN: normal at all sites. See records ?  ? Essential hypertension 01/28/2015  ? Diverticulitis of large intestine 08/19/2014  ?  Overview:  ?2015 ?  ? Hydradenitis 08/19/2014  ? Nephrolithiasis 08/19/2014  ?  Overview:  ?Dr. Kellie Simmering ?  ? Obstructive sleep apnea 08/01/2014  ? Controlled type 2 diabetes mellitus with diabetic polyneuropathy, without long-term current use of insulin ([place]) 06/10/2014  ? ? ?Social History: ?Patient  reports that she quit smoking about 25 years ago. Her smoking use included cigarettes. She has a 25.00 pack-year smoking history. She has never used smokeless tobacco. She reports that she does not drink alcohol and does not use drugs. ? ?Review of Systems: ?Ophthalmic: negative for eye pain, loss of vision or double vision ?Cardiovascular: negative for chest pain ?Respiratory: negative for SOB or persistent  cough ?Gastrointestinal: negative for abdominal pain ?Genitourinary: negative for dysuria or gross hematuria ?MSK: negative for foot lesions ?Neurologic: negative for weakness or gait disturbance ? ?Objective  ?Vitals: Temp (!) 97.2 ?F (36.2 ?C) (Temporal)   Ht '5\' 6"'$  (1.676 m)   Wt 191 lb 6.4 oz (86.8 kg)   BMI 30.89 kg/m?  ?General: well appearing, no acute distress  ?Psych:  Alert and oriented, normal mood and affect ? ? ?Diabetic education: ongoing education regarding chronic disease management for diabetes was given today. We continue to reinforce the ABC's of diabetic management: A1c (<7 or 8 dependent upon patient), tight blood pressure control, and cholesterol management with goal LDL < 100 minimally. We discuss diet  strategies, exercise recommendations, medication options and possible side effects. At each visit, we review recommended immunizations and preventive care recommendations for diabetics and stress that good diabetic control can prevent other problems. See below for this patient's data. ? ? ?Commons side effects, risks, benefits, and alternatives for medications and treatment plan prescribed today were discussed, and the patient expressed understanding of the given instructions. Patient is instructed to call or message via MyChart if he/she has any questions or concerns regarding our treatment plan. No barriers to understanding were identified. We discussed Red Flag symptoms and signs in detail. Patient expressed understanding regarding what to do in case of urgent or emergency type symptoms.  ?Medication list was reconciled, printed and provided to the patient in AVS. Patient instructions and summary information was reviewed with the patient as documented in the AVS. ?This note was prepared with assistance of Systems analyst. Occasional wrong-word or sound-a-like substitutions may have occurred due to the inherent limitations of voice recognition software ? ?This visit  occurred during the SARS-CoV-2 public health emergency.  Safety protocols were in place, including screening questions prior to the visit, additional usage of staff PPE, and extensive cleaning of exam room while observing appropria

## 2022-02-23 DIAGNOSIS — H2511 Age-related nuclear cataract, right eye: Secondary | ICD-10-CM | POA: Diagnosis not present

## 2022-02-23 DIAGNOSIS — H25811 Combined forms of age-related cataract, right eye: Secondary | ICD-10-CM | POA: Diagnosis not present

## 2022-02-23 DIAGNOSIS — H2512 Age-related nuclear cataract, left eye: Secondary | ICD-10-CM | POA: Diagnosis not present

## 2022-02-24 ENCOUNTER — Other Ambulatory Visit: Payer: Self-pay | Admitting: Family Medicine

## 2022-02-24 MED ORDER — NITROFURANTOIN MONOHYD MACRO 100 MG PO CAPS: 100.0000 mg | ORAL_CAPSULE | Freq: Two times a day (BID) | ORAL | 0 refills | Status: DC

## 2022-02-24 MED ORDER — POTASSIUM CHLORIDE CRYS ER 20 MEQ PO TBCR: 20.0000 meq | EXTENDED_RELEASE_TABLET | ORAL | 3 refills | Status: DC

## 2022-02-24 NOTE — Progress Notes (Signed)
Please call patient: I have reviewed his/her lab results. Urine remains infected. I have ordered a different antibiotic (cipro was resistant): macrobid 100 bid x 7 days.  ?F/u if not improving.

## 2022-02-24 NOTE — Progress Notes (Signed)
Please call patien3t: I have reviewed his/her lab results. Potassium level is better. Please take the potassium supplement 3x/week and I will recheck levels at her next visit. Still waiting on urine culture results.

## 2022-02-24 NOTE — Addendum Note (Signed)
Addended by: Billey Chang on: 02/24/2022 11:31 AM ? ? Modules accepted: Orders ? ?

## 2022-02-25 LAB — URINE CULTURE
MICRO NUMBER:: 13213986
SPECIMEN QUALITY:: ADEQUATE

## 2022-02-25 MED ORDER — CIPROFLOXACIN HCL 500 MG PO TABS
500.0000 mg | ORAL_TABLET | Freq: Two times a day (BID) | ORAL | 0 refills | Status: AC
Start: 2022-02-25 — End: 2022-03-02

## 2022-02-25 NOTE — Addendum Note (Signed)
Addended by: Billey Chang on: 02/25/2022 10:30 AM ? ? Modules accepted: Orders ? ?

## 2022-02-25 NOTE — Progress Notes (Signed)
Please call patient: I have reviewed his/her lab results. Her urine culture returned with a different bacteria than last time: and this was is resistant to macrobid.  ?So need to stop the macrobid (can keep on hand for future use if needed) and I have ordered cipro to take again for 5 days. Thanks.

## 2022-03-02 DIAGNOSIS — H2511 Age-related nuclear cataract, right eye: Secondary | ICD-10-CM | POA: Diagnosis not present

## 2022-03-02 DIAGNOSIS — H2512 Age-related nuclear cataract, left eye: Secondary | ICD-10-CM | POA: Diagnosis not present

## 2022-03-02 DIAGNOSIS — H25812 Combined forms of age-related cataract, left eye: Secondary | ICD-10-CM | POA: Diagnosis not present

## 2022-03-04 ENCOUNTER — Encounter: Payer: Self-pay | Admitting: Family Medicine

## 2022-03-09 ENCOUNTER — Other Ambulatory Visit: Payer: Self-pay

## 2022-03-09 DIAGNOSIS — R8290 Unspecified abnormal findings in urine: Secondary | ICD-10-CM

## 2022-03-09 NOTE — Telephone Encounter (Signed)
Message sent thru MyChart for pt to schedule lab appt to drop off urine. ?

## 2022-03-09 NOTE — Telephone Encounter (Signed)
Please advise if patient needs lab or OV?  Patient can be reached back at [phone].

## 2022-04-07 ENCOUNTER — Other Ambulatory Visit: Payer: Self-pay | Admitting: Family Medicine

## 2022-04-20 ENCOUNTER — Other Ambulatory Visit: Payer: Self-pay | Admitting: Family Medicine

## 2022-04-26 ENCOUNTER — Telehealth: Payer: Self-pay | Admitting: Pharmacist

## 2022-04-26 NOTE — Progress Notes (Signed)
Chronic Care Management Pharmacy Assistant   Name: Marilyn Myers  MRN: [id number] DOB: 03/12/1951   Reason for Encounter: Diabetes Adherence Call    Recent office visits:  02/22/2022 OV (PCP) [name], MD; Her urine culture returned with a different bacteria than last time: and this was is resistant to macrobid.  So need to stop the macrobid (can keep on hand for future use if needed) and I have ordered cipro to take again for 5 days.  Potassium level is better. Please take the potassium supplement 3x/week and I will recheck levels at her next visit  Recent consult visits:  None since last adherence call  Hospital visits:  None in previous 6 months  Medications: Outpatient Encounter Medications as of 04/26/2022  Medication Sig Note   BESIVANCE 0.6 % SUSP Apply 1 drop to eye 3 (three) times daily. 02/22/2022: Post procedure 02/23/2022.   clotrimazole (LOTRIMIN) 1 % cream Apply 1 application topically 2 (two) times daily.    clotrimazole-betamethasone (LOTRISONE) cream Apply 1 application topically daily.    Cyanocobalamin (VITAMIN B 12 PO) Take 1 tablet by mouth at bedtime.    diltiazem (CARDIZEM CD) 120 MG 24 hr capsule TAKE ONE CAPSULE BY MOUTH DAILY    doxepin (SINEQUAN) 10 MG capsule Take 10 mg by mouth. Three tablets at bedtime    DULoxetine (CYMBALTA) 60 MG capsule Take 1 capsule (60 mg total) by mouth daily.    DUREZOL 0.05 % EMUL Place 1 drop into the right eye 3 (three) times daily. 02/22/2022: Post procedure 02/23/2022.   EYSUVIS 0.25 % SUSP Apply 1 drop to eye 4 (four) times daily. 02/22/2022: Post procedure 02/23/2022.   ezetimibe (ZETIA) 10 MG tablet Take 1 tablet (10 mg total) by mouth daily.    FLAREX 0.1 % ophthalmic suspension Apply to eye. 02/22/2022: Post procedure 02/23/2022.   fluconazole (DIFLUCAN) 150 MG tablet Take one tablet today; may repeat in 3 days if symptoms persist    gabapentin (NEURONTIN) 800 MG tablet TAKE TWO TABLETS BY MOUTH THREE TIMES A DAY     losartan-hydrochlorothiazide (HYZAAR) 100-25 MG tablet Take 1 tablet by mouth daily.    metFORMIN (GLUCOPHAGE) 1000 MG tablet Take 1 tablet (1,000 mg total) by mouth 2 (two) times daily.    pantoprazole (PROTONIX) 20 MG tablet TAKE 1 TO 2 TABLETS (20-40 MG TOTAL) BY MOUTH DAILY.    potassium chloride SA (KLOR-CON M) 20 MEQ tablet Take 1 tablet (20 mEq total) by mouth 3 (three) times a week.    PROLENSA 0.07 % SOLN Apply 1 drop to eye 2 (two) times daily. 02/22/2022: Post procedure 02/23/2022.   No facility-administered encounter medications on file as of 04/26/2022.   Recent Relevant Labs: Lab Results  Component Value Date/Time   HGBA1C 7.5 (H) 02/05/2022 11:25 AM   HGBA1C 6.8 (A) 09/22/2021 11:08 AM   HGBA1C 7.3 (H) 07/01/2021 10:15 AM   HGBA1C 6.4 11/06/2018 12:00 AM   MICROALBUR 7.5 (H) 02/05/2022 11:25 AM    Kidney Function Lab Results  Component Value Date/Time   CREATININE 1.01 02/22/2022 10:05 AM   CREATININE 1.07 02/05/2022 11:25 AM   GFR 55.50 (L) 02/22/2022 10:05 AM   GFRNONAA 60 (L) 03/11/2021 04:45 PM   GFRAA 77 (L) 07/26/2014 11:09 AM    Current antihyperglycemic regimen:  Metformin 1000 mg twice daily  What recent interventions/DTPs have been made to improve glycemic control:  No recent interventions. Patient states she decided to not take Ozempic due to  cost of the medication. She does not wish to apply for a patient assistance program at this time.  Have there been any recent hospitalizations or ED visits since last visit with CPP? No  Patient denies hypoglycemic symptoms.  Patient denies hyperglycemic symptoms.  How often are you checking your blood sugar? Patient states she is not currently check her blood sugars at home.  What are your blood sugars ranging?  Patient has no readings to report.  During the week, how often does your blood glucose drop below 70? Patient does not currently monitor glucose. She denies hypoglycemic symptoms.  Are you checking your  feet daily/regularly? Yes  Adherence Review: Is the patient currently on a STATIN medication? No Is the patient currently on ACE/ARB medication? Yes Does the patient have >5 day gap between last estimated fill dates? No  Care Gaps: Medicare Annual Wellness: Completed 12/17/2021 Ophthalmology Exam: Next due on 11/02/2022 Foot Exam: Overdue since 01/16/2022 Hemoglobin A1C: 7.5% on 02/05/2022 Colonoscopy: Next due on 06/28/2028 Dexa Scan: Next due on 07/05/2023 Mammogram: Next due on 01/24/2023  Future Appointments  Date Time Provider [place]  05/12/2022  9:30 AM [name], MD [facility]  07/13/2022  2:45 PM [facility] CCM PHARMACIST [facility]  12/23/2022  3:15 PM [facility] HEALTH COACH [facility]   Star Rating Drugs: Losartan Potassium 100 mg last filled 01/11/2022 90 DS Ozempic last filled 02/10/2022 56 DS Metformin 1000 mg last filled 02/22/2022 90 DS  April D Calhoun, [place] Pharmacist Assistant [phone]

## 2022-05-03 IMAGING — CT CT ABD-PELV W/ CM
2 of 5 series · 16 of 46 positions shown, 18 images · IV contrast (omnipaque)
Comparison: 04/05/2016

CLINICAL DATA: Right abdominal pain, nausea, vomiting, diarrhea

EXAM:
CT ABDOMEN AND PELVIS WITH CONTRAST
TECHNIQUE: Multidetector CT imaging of the abdomen and pelvis was performed
using the standard protocol following bolus administration of
intravenous contrast.
CONTRAST:  100mL OMNIPAQUE IOHEXOL 300 MG/ML  SOLN

[Series 2: axial st · axial · 0.92mm/px · z∈[-583,-193]mm · 13 of 90 slices shown, 15 images]
[im 6/90  soft-tissue]
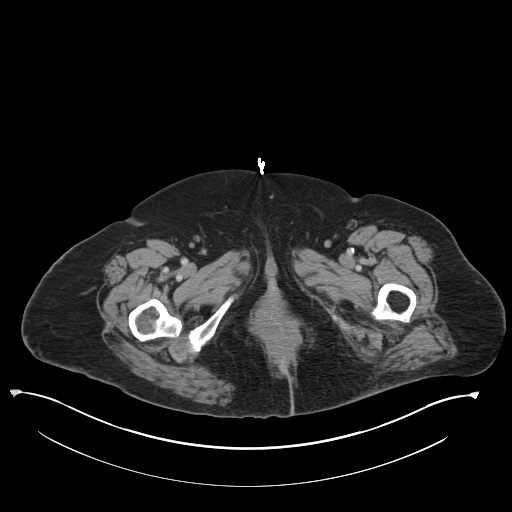
[im 6/90  bone]
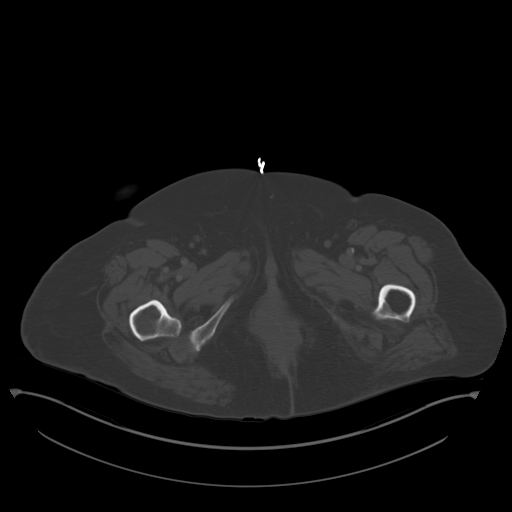
[im 12/90  soft-tissue]
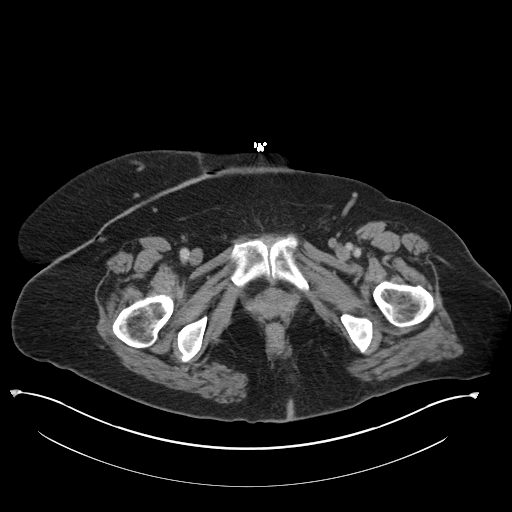
[im 18/90  soft-tissue]
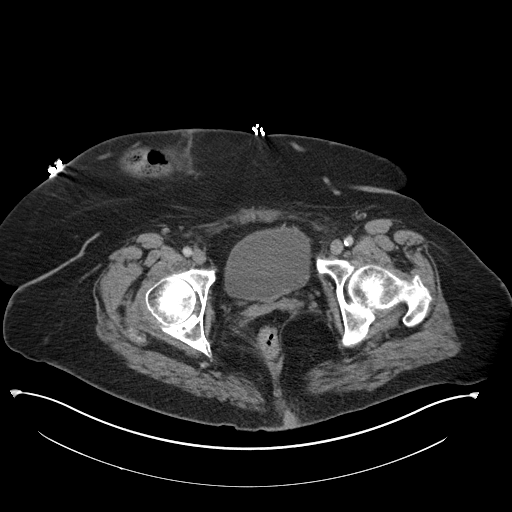
[im 24/90  soft-tissue]
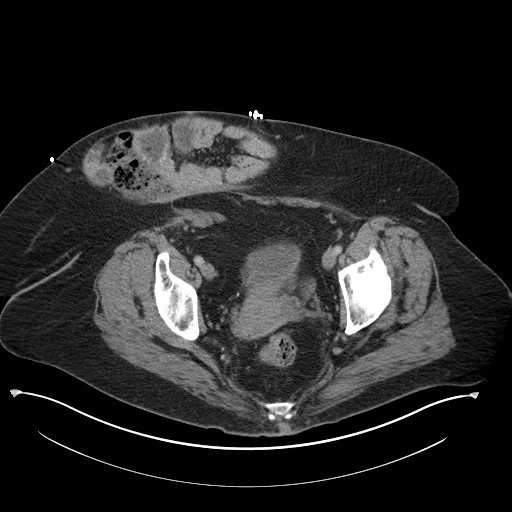
[im 30/90  soft-tissue]
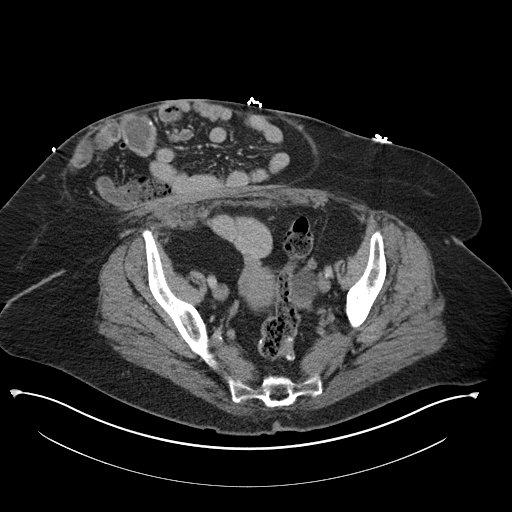
[im 36/90  soft-tissue]
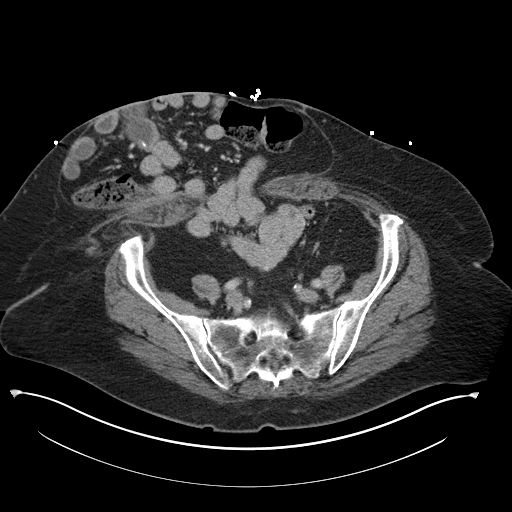
[im 48/90  soft-tissue]
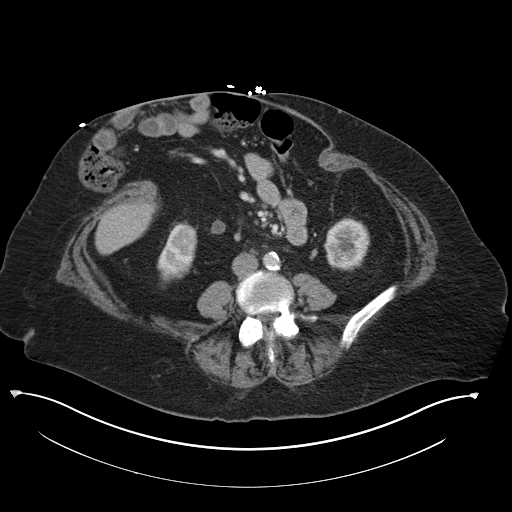
[im 54/90  soft-tissue]
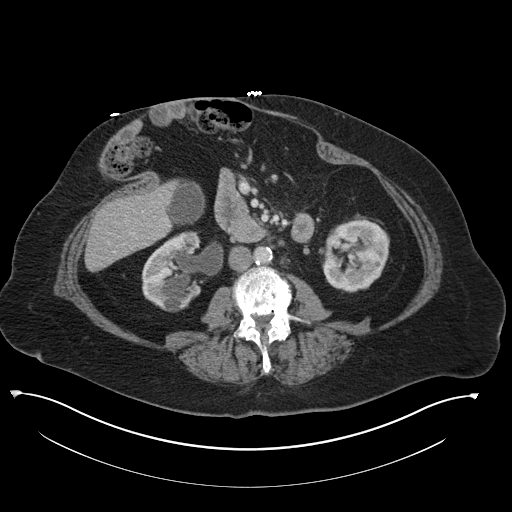
[im 60/90  soft-tissue]
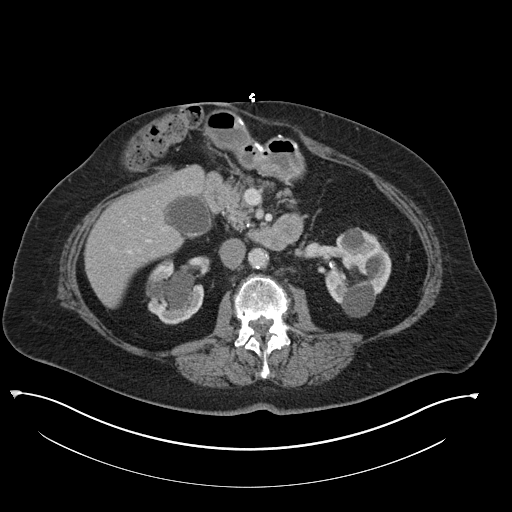
[im 60/90  bone]
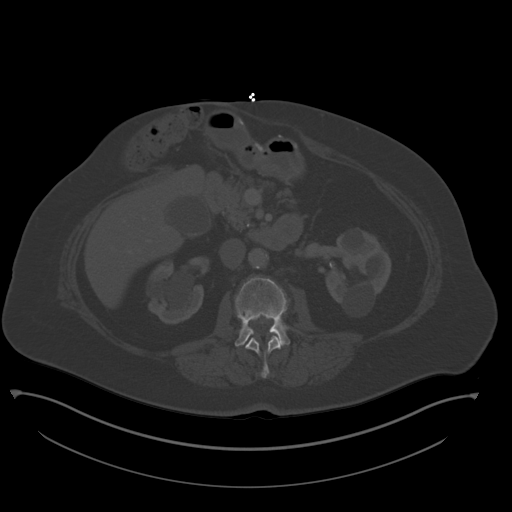
[im 66/90  soft-tissue]
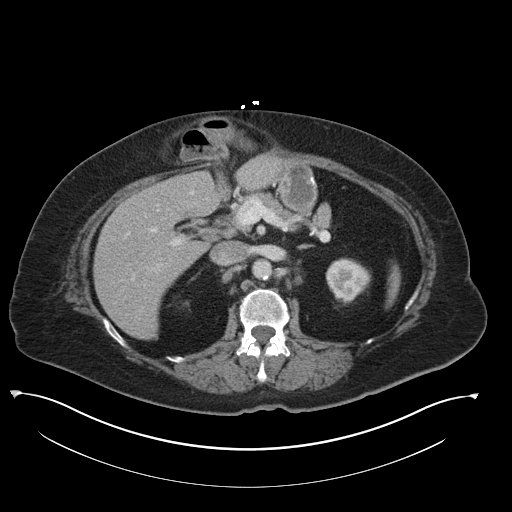
[im 72/90  soft-tissue]
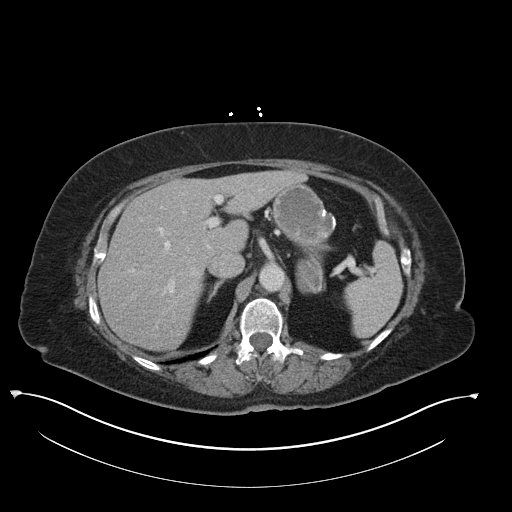
[im 78/90  soft-tissue]
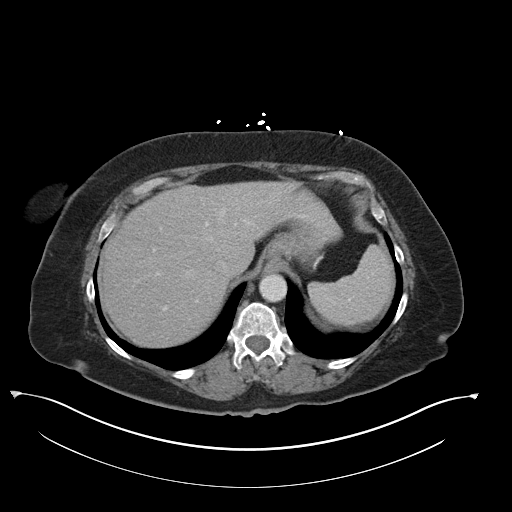
[im 84/90  soft-tissue]
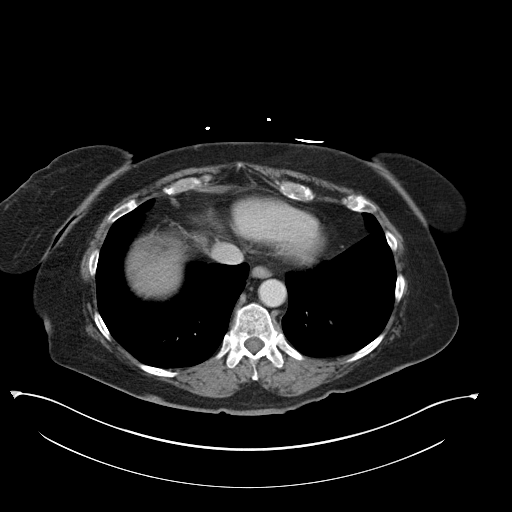

[Series 5: coronal st · coronal · 0.92mm/px · 3 of 166 slices shown]
[im 56/166  soft-tissue]
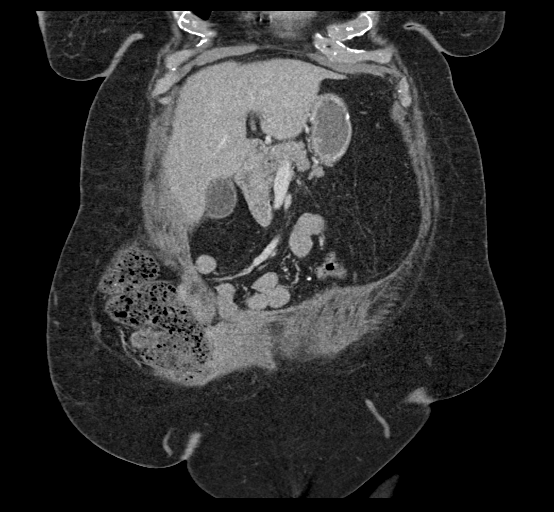
[im 74/166  soft-tissue]
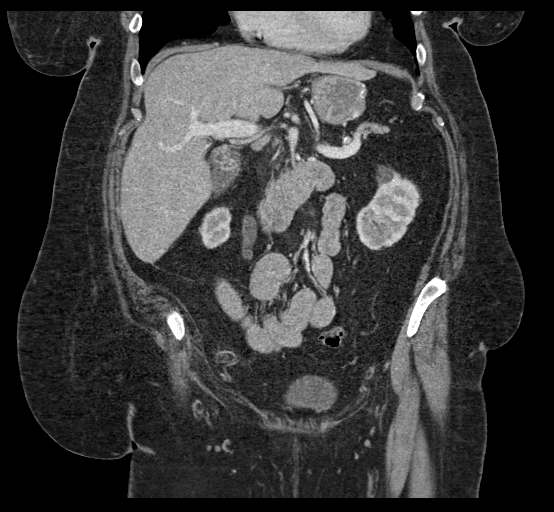
[im 92/166  soft-tissue]
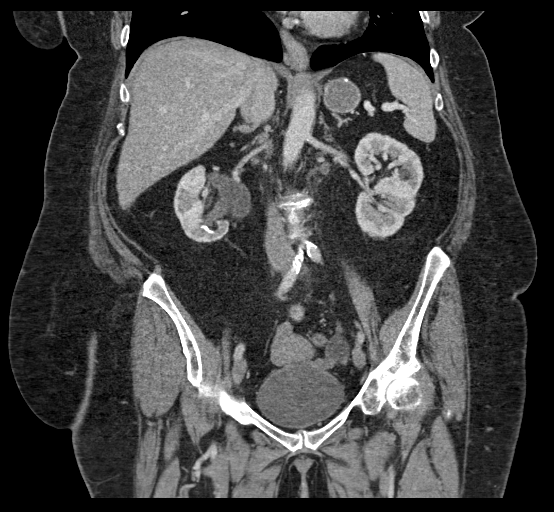

[16 of 46 positions shown; findings below may reference images not displayed]

FINDINGS: Lower chest: Visualized lung bases are clear bilaterally. The
visualized heart and pericardium are unremarkable.

Hepatobiliary: Cholelithiasis noted without pericholecystic
inflammatory change identified. Liver unremarkable. No intra or
extrahepatic biliary ductal dilation.

Pancreas: Unremarkable

Spleen: Unremarkable

Adrenals/Urinary Tract: The adrenal glands are unremarkable. The
kidneys are normal in position. Mild bilateral renal cortical
atrophy is present, right greater than left. There is mild right
hydronephrosis secondary to an obstructing 3 mm x 4 mm calculus
within the mid right ureter at the level of L5-S1. Superimposed mild
bilateral nonobstructing nephrolithiasis is present. No urolithiasis
on the left. No hydronephrosis on the left. Multiple simple cortical
cysts are seen within the kidneys bilaterally. The bladder is
unremarkable.

Stomach/Bowel: Large ventral hernia is present containing multiple
loops of unremarkable large and small bowel as well as the distal
stomach. Surgical changes of probable gastric sleeve resection,
partial small bowel resection, and sigmoid colectomy are identified.
The stomach, small bowel, and large bowel are otherwise
unremarkable. Appendix normal. No free intraperitoneal gas or fluid.

Vascular/Lymphatic: Moderate aortoiliac atherosclerotic
calcification. No aortic aneurysm. No pathologic adenopathy within
the abdomen and pelvis.

Reproductive: 3.6 cm left ovarian simple appearing cyst is present,
new since prior examination and indeterminate. The pelvic organs are
otherwise unremarkable.

Other: Rectum unremarkable.

Musculoskeletal: No acute bone abnormality. Degenerative changes are
seen within the lumbar spine and hips bilaterally.
IMPRESSION: Obstructing 3 mm x 4 mm calculus within the mid right ureter
resulting in mild right hydronephrosis. Superimposed bilateral
nonobstructing nephrolithiasis.

Bilateral renal cortical atrophy, right greater than left.

Cholelithiasis.

Postsurgical changes as described above. Large ventral hernia
containing multiple loops of bowel as well as the distal stomach
without evidence of obstruction or incarceration.

3.6 cm left ovarian simple appearing cyst, new since prior
examination. Recommend follow-up US in 6-12 months. Note: This
recommendation does not apply to premenarchal patients and to those
with increased risk (genetic, family history, elevated tumor markers
or other high-risk factors) of ovarian cancer. Reference: [id number]

Aortic Atherosclerosis ([id number]).

## 2022-05-12 ENCOUNTER — Ambulatory Visit: Payer: Medicare Other | Admitting: Family Medicine

## 2022-05-12 ENCOUNTER — Other Ambulatory Visit (HOSPITAL_COMMUNITY)
Admission: RE | Admit: 2022-05-12 | Discharge: 2022-05-12 | Disposition: A | Discharge status: Home / Self Care | Payer: Medicare Other | Source: Ambulatory Visit | Attending: Family Medicine | Admitting: Family Medicine

## 2022-05-12 ENCOUNTER — Encounter: Payer: Self-pay | Admitting: Family Medicine

## 2022-05-12 ENCOUNTER — Ambulatory Visit (INDEPENDENT_AMBULATORY_CARE_PROVIDER_SITE_OTHER): Payer: Medicare Other | Admitting: Family Medicine

## 2022-05-12 VITALS — BP 134/78 | HR 85 | Temp 97.7°F | Ht 66.0 in | Wt 182.4 lb

## 2022-05-12 DIAGNOSIS — Z5320 Procedure and treatment not carried out because of patient's decision for unspecified reasons: Secondary | ICD-10-CM | POA: Diagnosis not present

## 2022-05-12 DIAGNOSIS — N898 Other specified noninflammatory disorders of vagina: Secondary | ICD-10-CM | POA: Insufficient documentation

## 2022-05-12 DIAGNOSIS — E782 Mixed hyperlipidemia: Secondary | ICD-10-CM | POA: Diagnosis not present

## 2022-05-12 DIAGNOSIS — E1142 Type 2 diabetes mellitus with diabetic polyneuropathy: Secondary | ICD-10-CM

## 2022-05-12 DIAGNOSIS — E1169 Type 2 diabetes mellitus with other specified complication: Secondary | ICD-10-CM | POA: Diagnosis not present

## 2022-05-12 DIAGNOSIS — N390 Urinary tract infection, site not specified: Secondary | ICD-10-CM | POA: Diagnosis not present

## 2022-05-12 DIAGNOSIS — E876 Hypokalemia: Secondary | ICD-10-CM | POA: Diagnosis not present

## 2022-05-12 DIAGNOSIS — I10 Essential (primary) hypertension: Secondary | ICD-10-CM

## 2022-05-12 LAB — URINALYSIS, ROUTINE W REFLEX MICROSCOPIC
Bilirubin Urine: NEGATIVE
Hgb urine dipstick: NEGATIVE
Ketones, ur: NEGATIVE
Nitrite: POSITIVE — AB
RBC / HPF: NONE SEEN (ref 0–?)
Specific Gravity, Urine: 1.01 (ref 1.000–1.030)
Total Protein, Urine: NEGATIVE
Urine Glucose: NEGATIVE
Urobilinogen, UA: 0.2 (ref 0.0–1.0)
pH: 7 (ref 5.0–8.0)

## 2022-05-12 LAB — COMPREHENSIVE METABOLIC PANEL
ALT: 13 U/L (ref 0–35)
AST: 17 U/L (ref 0–37)
Albumin: 3.8 g/dL (ref 3.5–5.2)
Alkaline Phosphatase: 85 U/L (ref 39–117)
BUN: 23 mg/dL (ref 6–23)
CO2: 30 mEq/L (ref 19–32)
Calcium: 9.7 mg/dL (ref 8.4–10.5)
Chloride: 99 mEq/L (ref 96–112)
Creatinine, Ser: 0.97 mg/dL (ref 0.40–1.20)
GFR: 58.17 mL/min — ABNORMAL LOW (ref >60.00)
Glucose, Bld: 119 mg/dL — ABNORMAL HIGH (ref 70–99)
Potassium: 3.3 mEq/L — ABNORMAL LOW (ref 3.5–5.1)
Sodium: 137 mEq/L (ref 135–145)
Total Bilirubin: 0.8 mg/dL (ref 0.2–1.2)
Total Protein: 7.1 g/dL (ref 6.0–8.3)

## 2022-05-12 LAB — LIPID PANEL
Cholesterol: 148 mg/dL (ref 0–200)
HDL: 44.6 mg/dL (ref >39.00)
LDL Cholesterol: 70 mg/dL (ref 0–99)
NonHDL: 103.25
Total CHOL/HDL Ratio: 3
Triglycerides: 166 mg/dL — ABNORMAL HIGH (ref 0.0–149.0)
VLDL: 33.2 mg/dL (ref 0.0–40.0)

## 2022-05-12 LAB — POCT GLYCOSYLATED HEMOGLOBIN (HGB A1C): Hemoglobin A1C: 6.3 % — AB (ref 4.0–5.6)

## 2022-05-12 NOTE — Progress Notes (Signed)
Subjective  CC:  Chief Complaint  Patient presents with   Diabetes    Pt here for 14mo f/U with DM    HPI: Marilyn HEGWOODis a 71y.o. female who presents to the office today for follow up of diabetes and problems listed above in the chief complaint.  Diabetes follow up: Her diabetic control is reported as Improved. Eating less grapes and has lost weight. On met 1000 bid.  She denies exertional CP or SOB or symptomatic hypoglycemia. She denies foot sores and neuropathy pain is fairly well controlled now.  HTN on hyaar and controlled.  Taking daily supplements potassium (did not decrease to 3x/ week as instructed) - due for recheck.  C/o recurrent uti sxs. See last urine cultures x 2. + resistant organisms. C/o odor and frequency. Some vaginal itching as well. HLD On zetia and tolerating well. Refuses all statins.   Wt Readings from Last 3 Encounters:  05/12/22 182 lb 6.4 oz (82.7 kg)  02/22/22 191 lb 6.4 oz (86.8 kg)  02/05/22 189 lb 12.8 oz (86.1 kg)    BP Readings from Last 3 Encounters:  05/12/22 134/78  02/22/22 103/72  02/05/22 130/82    Assessment  1. Controlled type 2 diabetes mellitus with diabetic polyneuropathy, without long-term current use of insulin ([place]   2. Essential hypertension   3. Hypokalemia   4. Diabetic peripheral neuropathy ([place]   5. Refusal of statin medication by patient   6. Combined hyperlipidemia associated with type 2 diabetes mellitus ([place]   7. Recurrent UTI   8. Vaginal itching      Plan  Diabetes is currently well controlled. Continue better diet and met 1000 bid Neuropathy: severe. Continue high dose gabapentin HLD on zetia. Recheck lipid panel, nonfasting. Monitor lfts Hypokalemia, itatrogenic; recheck on daily supplements. May need to lower dose. Education given Check for UTI and yeast infection  Follow up: 3 mo for dm and htn recheck. Orders Placed This Encounter  Procedures   Urine Culture   Lipid panel   Comprehensive  metabolic panel   Urinalysis, Routine w reflex microscopic   POCT HgB A1C   No orders of the defined types were placed in this encounter.     Immunization History  Administered Date(s) Administered   Fluad Quad(high Dose 65+) 08/27/2019, 09/22/2021   Influenza, High Dose Seasonal PF 08/19/2014, 09/02/2015, 10/08/2016, 09/22/2017, 08/29/2018   Influenza-Unspecified 10/13/2020   PFIZER(Purple Top)SARS-COV-2 Vaccination 12/29/2019, 01/18/2020, 10/13/2020   Pneumococcal Conjugate-13 10/18/2016   Pneumococcal Polysaccharide-23 08/19/2014    Diabetes Related Lab Review: Lab Results  Component Value Date   HGBA1C 6.3 (A) 05/12/2022   HGBA1C 7.5 (H) 02/05/2022   HGBA1C 6.8 (A) 09/22/2021    Lab Results  Component Value Date   MICROALBUR 7.5 (H) 02/05/2022   Lab Results  Component Value Date   CREATININE 1.01 02/22/2022   BUN 26 (H) 02/22/2022   NA 136 02/22/2022   K 4.0 02/22/2022   CL 98 02/22/2022   CO2 27 02/22/2022   Lab Results  Component Value Date   CHOL 212 (H) 02/05/2022   CHOL 197 01/16/2021   CHOL 200 08/27/2019   Lab Results  Component Value Date   HDL 54.30 02/05/2022   HDL 41.60 01/16/2021   HDL 41.30 08/27/2019   Lab Results  Component Value Date   LDLCALC 87 11/06/2018   Lab Results  Component Value Date   TRIG 224.0 (H) 02/05/2022   TRIG 285.0 (H) 01/16/2021   TRIG  392.0 (H) 08/27/2019   Lab Results  Component Value Date   CHOLHDL 4 02/05/2022   CHOLHDL 5 01/16/2021   CHOLHDL 5 08/27/2019   Lab Results  Component Value Date   LDLDIRECT 144.0 02/05/2022   LDLDIRECT 117.0 01/16/2021   LDLDIRECT 115.0 08/27/2019   The 10-year ASCVD risk score (Arnett DK, et al., 2019) is: 33.3%   Values used to calculate the score:     Age: 71 years     Sex: Female     Is Non-Hispanic African American: No     Diabetic: Yes     Tobacco smoker: No     Systolic Blood Pressure: 408 mmHg     Is BP treated: Yes     HDL Cholesterol: 54.3 mg/dL     Total  Cholesterol: 212 mg/dL I have reviewed the PMH, Fam and Soc history. Patient Active Problem List   Diagnosis Date Noted   Combined hyperlipidemia associated with type 2 diabetes mellitus ([place]) 05/12/2022   Major depression, recurrent, chronic ([place])    History of suicide attempt - 2022 opiod overdose    Gastroesophageal reflux disease without esophagitis 01/07/2020   Obesity (BMI 30-39.9) 02/22/2019   Refusal of statin medication by patient 01/24/2018    Husband has problems with this medication for her    Seasonal allergic rhinitis 01/24/2018   PVC's (premature ventricular contractions) 10/08/2016   Diabetic peripheral neuropathy ([place]) 09/28/2016    Failed lyrica    S/P colostomy takedown 07/02/2015   History of osteopenia 05/19/2015     T = -1.4 Dexa 2016.  Dexa 2019 at GYN: normal at all sites. See records    Essential hypertension 01/28/2015   Diverticulitis of large intestine 08/19/2014    Overview:  2015    Hydradenitis 08/19/2014   Nephrolithiasis 08/19/2014    Overview:  Dr. Kellie Simmering    Obstructive sleep apnea 08/01/2014   Controlled type 2 diabetes mellitus with diabetic polyneuropathy, without long-term current use of insulin ([facility]) 06/10/2014    Social History: Patient  reports that she quit smoking about 25 years ago. Her smoking use included cigarettes. She has a 25.00 pack-year smoking history. She has never used smokeless tobacco. She reports that she does not drink alcohol and does not use drugs.  Review of Systems: Ophthalmic: negative for eye pain, loss of vision or double vision Cardiovascular: negative for chest pain Respiratory: negative for SOB or persistent cough Gastrointestinal: negative for abdominal pain Genitourinary: negative for dysuria or gross hematuria MSK: negative for foot lesions Neurologic: negative for weakness or gait disturbance  Objective  Vitals: BP 134/78   Pulse 85   Temp 97.7 F (36.5 C)   Ht 5' 6" (1.676 m)   Wt 182 lb  6.4 oz (82.7 kg)   SpO2 98%   BMI 29.44 kg/m  General: well appearing, no acute distress   Cardiovascular:  Nl S1 and S2, RRR without murmur, gallop or rub. no edema Respiratory:  Good breath sounds bilaterally, CTAB with normal effort, no rales Neurologic:   Mental status is normal. normal gait Foot exam: no erythema, pallor, or cyanosis visible nl proprioception and sensation to monofilament testing bilaterally, +2 distal pulses bilaterally    Diabetic education: ongoing education regarding chronic disease management for diabetes was given today. We continue to reinforce the ABC's of diabetic management: A1c (<7 or 8 dependent upon patient), tight blood pressure control, and cholesterol management with goal LDL < 100 minimally. We discuss diet strategies, exercise recommendations, medication options  and possible side effects. At each visit, we review recommended immunizations and preventive care recommendations for diabetics and stress that good diabetic control can prevent other problems. See below for this patient's data.   Commons side effects, risks, benefits, and alternatives for medications and treatment plan prescribed today were discussed, and the patient expressed understanding of the given instructions. Patient is instructed to call or message via MyChart if he/she has any questions or concerns regarding our treatment plan. No barriers to understanding were identified. We discussed Red Flag symptoms and signs in detail. Patient expressed understanding regarding what to do in case of urgent or emergency type symptoms.  Medication list was reconciled, printed and provided to the patient in AVS. Patient instructions and summary information was reviewed with the patient as documented in the AVS. This note was prepared with assistance of Dragon voice recognition software. Occasional wrong-word or sound-a-like substitutions may have occurred due to the inherent limitations of voice recognition  software  This visit occurred during the SARS-CoV-2 public health emergency.  Safety protocols were in place, including screening questions prior to the visit, additional usage of staff PPE, and extensive cleaning of exam room while observing appropriate contact time as indicated for disinfecting solutions.

## 2022-05-12 NOTE — Patient Instructions (Signed)
Please return in 3 months for diabetes follow up   If you have any questions or concerns, please don't hesitate to send me a message via MyChart or call the office at [phone]. Thank you for visiting with us today! It's our pleasure caring for you.  

## 2022-05-13 ENCOUNTER — Ambulatory Visit (INDEPENDENT_AMBULATORY_CARE_PROVIDER_SITE_OTHER): Payer: Medicare Other | Admitting: Family Medicine

## 2022-05-13 ENCOUNTER — Encounter: Payer: Self-pay | Admitting: Family Medicine

## 2022-05-13 VITALS — BP 110/70 | HR 96 | Temp 97.6°F | Ht 66.0 in | Wt 182.4 lb

## 2022-05-13 DIAGNOSIS — M7061 Trochanteric bursitis, right hip: Secondary | ICD-10-CM | POA: Diagnosis not present

## 2022-05-13 DIAGNOSIS — I10 Essential (primary) hypertension: Secondary | ICD-10-CM | POA: Diagnosis not present

## 2022-05-13 DIAGNOSIS — E876 Hypokalemia: Secondary | ICD-10-CM

## 2022-05-13 LAB — CERVICOVAGINAL ANCILLARY ONLY
Bacterial Vaginitis (gardnerella): NEGATIVE
Candida Glabrata: NEGATIVE
Candida Vaginitis: NEGATIVE
Comment: NEGATIVE
Comment: NEGATIVE
Comment: NEGATIVE
Comment: NEGATIVE
Trichomonas: NEGATIVE

## 2022-05-13 MED ORDER — POTASSIUM CHLORIDE CRYS ER 20 MEQ PO TBCR: 20.0000 meq | EXTENDED_RELEASE_TABLET | Freq: Every day | ORAL | 3 refills | Status: DC

## 2022-05-13 MED ORDER — TRIAMCINOLONE ACETONIDE 40 MG/ML IJ SUSP
20.0000 mg | Freq: Once | INTRAMUSCULAR | Status: AC
  Administered 2022-05-13: 20 mg via INTRA_ARTICULAR

## 2022-05-13 NOTE — Progress Notes (Unsigned)
Subjective  CC:  Chief Complaint  Patient presents with   Hip Pain    Pt has been Dx with bursitis and is here to talk about getting a cortisone injection     HPI: Marilyn [place] is a 71 y.o. female who presents to the office today to address the problems listed above in the chief complaint. 71 year old with right hip pain ongoing for about 2 months.  Has prior history of hip bursitis and received a steroid injection about a year ago that was very helpful.  She has no new symptoms.  She does have some back pain.  She has seen orthopedics in the past. Hypokalemia: On diureticAt last taking  Assessment  1. Greater trochanteric bursitis of right hip   2. Hypokalemia      Plan  ***:  ***  Follow up: No follow-ups on file.  07/13/2022  No orders of the defined types were placed in this encounter.  Meds ordered this encounter  Medications   potassium chloride SA (KLOR-CON M) 20 MEQ tablet    Sig: Take 1 tablet (20 mEq total) by mouth daily.    Dispense:  90 tablet    Refill:  3      I reviewed the patients updated PMH, FH, and SocHx.    Patient Active Problem List   Diagnosis Date Noted   Combined hyperlipidemia associated with type 2 diabetes mellitus ([place]) 05/12/2022   Major depression, recurrent, chronic ([place])    History of suicide attempt - 2022 opiod overdose    Gastroesophageal reflux disease without esophagitis 01/07/2020   Obesity (BMI 30-39.9) 02/22/2019   Refusal of statin medication by patient 01/24/2018   Seasonal allergic rhinitis 01/24/2018   PVC's (premature ventricular contractions) 10/08/2016   Diabetic peripheral neuropathy ([place]) 09/28/2016   S/P colostomy takedown 07/02/2015   History of osteopenia 05/19/2015   Essential hypertension 01/28/2015   Diverticulitis of large intestine 08/19/2014   Hydradenitis 08/19/2014   Nephrolithiasis 08/19/2014   Obstructive sleep apnea 08/01/2014   Controlled type 2 diabetes mellitus with diabetic  polyneuropathy, without long-term current use of insulin ([facility]) 06/10/2014   Current Meds  Medication Sig   BESIVANCE 0.6 % SUSP Apply 1 drop to eye 3 (three) times daily.   clotrimazole (LOTRIMIN) 1 % cream Apply 1 application topically 2 (two) times daily.   clotrimazole-betamethasone (LOTRISONE) cream Apply 1 application topically daily.   Cyanocobalamin (VITAMIN B 12 PO) Take 1 tablet by mouth at bedtime.   diltiazem (CARDIZEM CD) 120 MG 24 hr capsule TAKE ONE CAPSULE BY MOUTH DAILY   doxepin (SINEQUAN) 10 MG capsule Take 10 mg by mouth. Three tablets at bedtime   DULoxetine (CYMBALTA) 60 MG capsule Take 1 capsule (60 mg total) by mouth daily.   DUREZOL 0.05 % EMUL Place 1 drop into the right eye 3 (three) times daily.   EYSUVIS 0.25 % SUSP Apply 1 drop to eye 4 (four) times daily.   ezetimibe (ZETIA) 10 MG tablet Take 1 tablet (10 mg total) by mouth daily.   gabapentin (NEURONTIN) 800 MG tablet TAKE TWO TABLETS BY MOUTH THREE TIMES A DAY   losartan-hydrochlorothiazide (HYZAAR) 100-25 MG tablet Take 1 tablet by mouth daily.   metFORMIN (GLUCOPHAGE) 1000 MG tablet Take 1 tablet (1,000 mg total) by mouth 2 (two) times daily.   pantoprazole (PROTONIX) 20 MG tablet TAKE 1 TO 2 TABLETS (20-40 MG TOTAL) BY MOUTH DAILY.   [DISCONTINUED] potassium chloride SA (KLOR-CON M) 20 MEQ tablet Take 1  tablet (20 mEq total) by mouth 3 (three) times a week.    Allergies: Patient is allergic to ace inhibitors, codeine, and flomax [tamsulosin hcl]. Family History: Patient family history includes Breast cancer in her mother; Diabetes in her maternal aunt and paternal grandmother; Heart disease in her mother; Renal Disease in her mother. Social History:  Patient  reports that she quit smoking about 25 years ago. Her smoking use included cigarettes. She has a 25.00 pack-year smoking history. She has never used smokeless tobacco. She reports that she does not drink alcohol and does not use drugs.  Review of  Systems: Constitutional: Negative for fever malaise or anorexia Cardiovascular: negative for chest pain Respiratory: negative for SOB or persistent cough Gastrointestinal: negative for abdominal pain  Objective  Vitals: BP 110/70   Pulse 96   Temp 97.6 F (36.4 C)   Ht '5\' 6"'$  (1.676 m)   Wt 182 lb 6.4 oz (82.7 kg)   SpO2 98%   BMI 29.44 kg/m  General: no acute distress , A&Ox3 HEENT: PEERL, conjunctiva normal, neck is supple Cardiovascular:  RRR without murmur or gallop.  Respiratory:  Good breath sounds bilaterally, CTAB with normal respiratory effort Skin:  Warm, no rashes  GR Trochanteric Bursa steroid injection  Procedure Note   Pre-operative Diagnosis: right hip bursitis   Post-operative Diagnosis: same   Indications: pain   Anesthesia: cold spray   Procedure Details    Verbal consent was obtained for the procedure. Universal time out done. The point of maximum tenderness was identified and marked over the hip bursa. The skin prepped with alcohol and cold spray used for anesthesia. A needle was advanced into the bursa and the steroid/lido ('20mg'$  kenalog: 1.57m lidocaine w/o epi) was administered easily.    Complications:  None; patient tolerated the procedure well.   Commons side effects, risks, benefits, and alternatives for medications and treatment plan prescribed today were discussed, and the patient expressed understanding of the given instructions. Patient is instructed to call or message via MyChart if he/she has any questions or concerns regarding our treatment plan. No barriers to understanding were identified. We discussed Red Flag symptoms and signs in detail. Patient expressed understanding regarding what to do in [place] of urgent or emergency type symptoms.  Medication list was reconciled, printed and provided to the patient in AVS. Patient instructions and summary information was reviewed with the patient as documented in the AVS. This note was prepared with  assistance of Dragon voice recognition software. Occasional wrong-word or sound-a-like substitutions may have occurred due to the inherent limitations of voice recognition software  This visit occurred during the SARS-CoV-2 public health emergency.  Safety protocols were in place, including screening questions prior to the visit, additional usage of staff PPE, and extensive cleaning of exam room while observing appropriate contact time as indicated for disinfecting solutions.

## 2022-05-14 ENCOUNTER — Encounter: Payer: Self-pay | Admitting: Family Medicine

## 2022-05-14 ENCOUNTER — Ambulatory Visit: Payer: Medicare Other | Admitting: Family Medicine

## 2022-05-14 LAB — URINE CULTURE
MICRO NUMBER:: 13553664
SPECIMEN QUALITY:: ADEQUATE

## 2022-05-19 ENCOUNTER — Other Ambulatory Visit: Payer: Self-pay

## 2022-05-19 DIAGNOSIS — N390 Urinary tract infection, site not specified: Secondary | ICD-10-CM

## 2022-05-19 MED ORDER — AMOXICILLIN 875 MG PO TABS: 875.0000 mg | ORAL_TABLET | Freq: Two times a day (BID) | ORAL | 0 refills | Status: AC

## 2022-05-19 NOTE — Addendum Note (Signed)
Addended by: Billey Chang on: 05/19/2022 10:34 AM   Modules accepted: Orders

## 2022-05-19 NOTE — Progress Notes (Signed)
Can you please add urine culture order? Thanks :)

## 2022-05-19 NOTE — Progress Notes (Signed)
Please call patient: I have reviewed his/her lab results. I have sent in antibiotics for her UTI. Please schedule a f/u visit in 2 weeks to recheck urine.

## 2022-06-02 ENCOUNTER — Ambulatory Visit (INDEPENDENT_AMBULATORY_CARE_PROVIDER_SITE_OTHER): Payer: Medicare Other | Admitting: Family Medicine

## 2022-06-02 ENCOUNTER — Encounter: Payer: Self-pay | Admitting: Family Medicine

## 2022-06-02 VITALS — BP 100/72 | HR 108 | Temp 98.0°F | Ht 66.0 in | Wt 180.0 lb

## 2022-06-02 DIAGNOSIS — G4709 Other insomnia: Secondary | ICD-10-CM

## 2022-06-02 DIAGNOSIS — N390 Urinary tract infection, site not specified: Secondary | ICD-10-CM

## 2022-06-02 DIAGNOSIS — M545 Low back pain, unspecified: Secondary | ICD-10-CM | POA: Diagnosis not present

## 2022-06-02 LAB — POCT URINALYSIS DIPSTICK
Bilirubin, UA: NEGATIVE
Blood, UA: NEGATIVE
Glucose, UA: NEGATIVE
Ketones, UA: NEGATIVE
Nitrite, UA: POSITIVE
Protein, UA: POSITIVE — AB
Spec Grav, UA: 1.015 (ref 1.010–1.025)
Urobilinogen, UA: 0.2 E.U./dL
pH, UA: 5 (ref 5.0–8.0)

## 2022-06-02 MED ORDER — SULFAMETHOXAZOLE-TRIMETHOPRIM 800-160 MG PO TABS: ORAL_TABLET | ORAL | 1 refills | Status: DC

## 2022-06-02 MED ORDER — DOXEPIN HCL 25 MG PO CAPS: 25.0000 mg | ORAL_CAPSULE | Freq: Every day | ORAL | 3 refills | Status: DC

## 2022-06-02 NOTE — Progress Notes (Signed)
Subjective    CC:  Chief Complaint  Patient presents with   Urinary Tract Infection    Pt her to F/U on UTI symptoms. Pt stated that she is still havibg UTI symptoms like itching, an frequent urination.    HPI: Marilyn Myers is a 71 y.o. female who presents to the office today to address the problems listed above in the chief complaint. Patient returns due to urinary urgency and frequency symptoms.  She recently has been treated for 2 back-to-back recurrent UTIs.  See urine cultures.  Recently completed amoxicillin for an E. coli infection.  Symptoms improved, however over the last several days her urinary frequency returned.  She denies dysuria, fever or flank pain.  She does see [facility] for nephrolithiasis.  No vaginal symptoms or abdominal pain. Recently had steroid injection for right hip bursitis, that has improved however her right-sided low back pain persists and slightly worse.  Now with limp.  Describes back pain without radicular symptoms.  No leg weakness.  Taking over-the-counter medicines.  The limp is causing her to feel off balance. Secondary insomnia from neuropathy which has been well controlled on doxepin.  Needs refill.  Assessment  1. Recurrent UTI   2. Acute right-sided low back pain without sciatica   3. Secondary insomnia      Plan  Recurrent UTI: Check urine culture, start Septra twice daily and then start prophylaxis with daily antibiotic.  Recommend contacting urologist for further evaluation.  Has had multiple resistant organisms.  No upper tract symptoms at this time. Right low back pain: Refer to sports medicine for further evaluation and help. Insomnia is controlled on doxepin: Changed to 25 mg nightly.  Follow up: No follow-ups on file.  Orders Placed This Encounter  Procedures   Urine Culture   Ambulatory referral to Urology   Ambulatory referral to Sports Medicine   POCT Urinalysis Dipstick   Meds ordered this encounter  Medications    doxepin (SINEQUAN) 25 MG capsule    Sig: Take 1 capsule (25 mg total) by mouth at bedtime.    Dispense:  90 capsule    Refill:  3   sulfamethoxazole-trimethoprim (BACTRIM DS) 800-160 MG tablet    Sig: Take 1 tablet by mouth 2 (two) times daily for 7 days, THEN 1 tablet daily.    Dispense:  90 tablet    Refill:  1      I reviewed the patients updated PMH, FH, and SocHx.    Patient Active Problem List   Diagnosis Date Noted   Secondary insomnia 06/02/2022   Combined hyperlipidemia associated with type 2 diabetes mellitus ([place]) 05/12/2022   Major depression, recurrent, chronic ([place])    History of suicide attempt - 2022 opiod overdose    Gastroesophageal reflux disease without esophagitis 01/07/2020   Obesity (BMI 30-39.9) 02/22/2019   Refusal of statin medication by patient 01/24/2018   Seasonal allergic rhinitis 01/24/2018   PVC's (premature ventricular contractions) 10/08/2016   Diabetic peripheral neuropathy ([place]) 09/28/2016   S/P colostomy takedown 07/02/2015   History of osteopenia 05/19/2015   Essential hypertension 01/28/2015   Diverticulitis of large intestine 08/19/2014   Hydradenitis 08/19/2014   Nephrolithiasis 08/19/2014   Obstructive sleep apnea 08/01/2014   Controlled type 2 diabetes mellitus with diabetic polyneuropathy, without long-term current use of insulin ([facility]) 06/10/2014   Current Meds  Medication Sig   BESIVANCE 0.6 % SUSP Apply 1 drop to eye 3 (three) times daily.   clotrimazole (LOTRIMIN) 1 %  cream Apply 1 application topically 2 (two) times daily.   clotrimazole-betamethasone (LOTRISONE) cream Apply 1 application topically daily.   Cyanocobalamin (VITAMIN B 12 PO) Take 1 tablet by mouth at bedtime.   diltiazem (CARDIZEM CD) 120 MG 24 hr capsule TAKE ONE CAPSULE BY MOUTH DAILY   DULoxetine (CYMBALTA) 60 MG capsule Take 1 capsule (60 mg total) by mouth daily.   DUREZOL 0.05 % EMUL Place 1 drop into the right eye 3 (three) times daily.   EYSUVIS 0.25  % SUSP Apply 1 drop to eye 4 (four) times daily.   ezetimibe (ZETIA) 10 MG tablet Take 1 tablet (10 mg total) by mouth daily.   gabapentin (NEURONTIN) 800 MG tablet TAKE TWO TABLETS BY MOUTH THREE TIMES A DAY   losartan-hydrochlorothiazide (HYZAAR) 100-25 MG tablet Take 1 tablet by mouth daily.   metFORMIN (GLUCOPHAGE) 1000 MG tablet Take 1 tablet (1,000 mg total) by mouth 2 (two) times daily.   pantoprazole (PROTONIX) 20 MG tablet TAKE 1 TO 2 TABLETS (20-40 MG TOTAL) BY MOUTH DAILY.   potassium chloride SA (KLOR-CON M) 20 MEQ tablet Take 1 tablet (20 mEq total) by mouth daily.   sulfamethoxazole-trimethoprim (BACTRIM DS) 800-160 MG tablet Take 1 tablet by mouth 2 (two) times daily for 7 days, THEN 1 tablet daily.   [DISCONTINUED] doxepin (SINEQUAN) 10 MG capsule Take 10 mg by mouth. Three tablets at bedtime    Review of Systems: Cardiovascular: negative for chest pain Respiratory: negative for SOB or persistent cough Gastrointestinal: negative for abdominal pain Constitutional: Negative for fever malaise or anorexia  Objective  Vitals: BP 100/72   Pulse (!) 108   Temp 98 F (36.7 C)   Ht '5\' 6"'$  (1.676 m)   Wt 180 lb (81.6 kg)   SpO2 97%   BMI 29.05 kg/m  General: no acute distress  Psych:  Alert and oriented, normal mood and affect Gastrointestinal: soft, flat abdomen, normal active bowel sounds, no palpable masses, no hepatosplenomegaly, no appreciated hernias, NO CVAT, no suprapubic ttp w/o rebound or guarding Right low back with tenderness to the SI and sciatic area, mild limp present, antalgic gait  Office Visit on 06/02/2022  Component Date Value Ref Range Status   Color, UA 06/02/2022 yellow   Final   Clarity, UA 06/02/2022 cloudy   Final   Glucose, UA 06/02/2022 Negative  Negative Final   Bilirubin, UA 06/02/2022 negative   Final   Ketones, UA 06/02/2022 negative   Final   Spec Grav, UA 06/02/2022 1.015  1.010 - 1.025 Final   Blood, UA 06/02/2022 negative   Final   pH,  UA 06/02/2022 5.0  5.0 - 8.0 Final   Protein, UA 06/02/2022 Positive (A)  Negative Final   Urobilinogen, UA 06/02/2022 0.2  0.2 or 1.0 E.U./dL Final   Nitrite, UA 06/02/2022 positive   Final   Leukocytes, UA 06/02/2022 Large (3+) (A)  Negative Final   Commons side effects, risks, benefits, and alternatives for medications and treatment plan prescribed today were discussed, and the patient expressed understanding of the given instructions. Patient is instructed to call or message via MyChart if he/she has any questions or concerns regarding our treatment plan. No barriers to understanding were identified. We discussed Red Flag symptoms and signs in detail. Patient expressed understanding regarding what to do in case of urgent or emergency type symptoms.  Medication list was reconciled, printed and provided to the patient in AVS. Patient instructions and summary information was reviewed with the patient as  documented in the AVS. This note was prepared with assistance of Dragon voice recognition software. Occasional wrong-word or sound-a-like substitutions may have occurred due to the inherent limitations of voice recognition software

## 2022-06-02 NOTE — Patient Instructions (Signed)
Please follow up as scheduled for your next visit with me: 08/11/2022   Please contact [facility] to make an appointment to evaluate you for recurrent cystitis (UTI's).  We will contact you to get you set up with the sports medicine specialist to help with your back pain and limp.   Take the antibiotics as prescribed, twice a day for a week and then once a daily until you see your urologist.   If you have any questions or concerns, please don't hesitate to send me a message via MyChart or call the office at [phone]. Thank you for visiting with [place] today! It's our pleasure caring for you.

## 2022-06-04 LAB — URINE CULTURE
MICRO NUMBER:: 13637164
SPECIMEN QUALITY:: ADEQUATE

## 2022-06-07 NOTE — Progress Notes (Signed)
Please call patient: I have reviewed his/her lab results. Urine culture shows a resistant organism. Please order augmenting 875 bid x 7 days. Then can take the septra daily after completion. Needs to see urologist as recommended

## 2022-06-08 ENCOUNTER — Ambulatory Visit: Payer: Medicare Other | Admitting: Sports Medicine

## 2022-06-08 ENCOUNTER — Other Ambulatory Visit: Payer: Self-pay

## 2022-06-08 DIAGNOSIS — N390 Urinary tract infection, site not specified: Secondary | ICD-10-CM

## 2022-06-08 MED ORDER — AMOXICILLIN-POT CLAVULANATE 875-125 MG PO TABS: 1.0000 | ORAL_TABLET | Freq: Two times a day (BID) | ORAL | 0 refills | Status: DC

## 2022-06-09 DIAGNOSIS — N200 Calculus of kidney: Secondary | ICD-10-CM | POA: Diagnosis not present

## 2022-06-09 DIAGNOSIS — N3000 Acute cystitis without hematuria: Secondary | ICD-10-CM | POA: Diagnosis not present

## 2022-06-21 ENCOUNTER — Telehealth: Payer: Self-pay | Admitting: Family Medicine

## 2022-06-21 NOTE — Telephone Encounter (Signed)
Pt states she would like a call back about her colon.

## 2022-06-21 NOTE — Telephone Encounter (Signed)
Spoke with pt about her questions/concerns about constipations. I told here to Benefiber in her drinks and see if that would help. If not, let us know by the end of the week if it still persist.

## 2022-07-05 ENCOUNTER — Ambulatory Visit (INDEPENDENT_AMBULATORY_CARE_PROVIDER_SITE_OTHER): Payer: Medicare Other

## 2022-07-05 ENCOUNTER — Ambulatory Visit
Admission: RE | Admit: 2022-07-05 | Discharge: 2022-07-05 | Disposition: A | Discharge status: Home / Self Care | Payer: Medicare Other | Source: Ambulatory Visit | Attending: Urgent Care | Admitting: Urgent Care

## 2022-07-05 VITALS — BP 139/88 | HR 88 | Temp 98.1°F | Resp 20

## 2022-07-05 DIAGNOSIS — N3000 Acute cystitis without hematuria: Secondary | ICD-10-CM

## 2022-07-05 DIAGNOSIS — Z8719 Personal history of other diseases of the digestive system: Secondary | ICD-10-CM | POA: Diagnosis not present

## 2022-07-05 DIAGNOSIS — R1032 Left lower quadrant pain: Secondary | ICD-10-CM

## 2022-07-05 DIAGNOSIS — K5900 Constipation, unspecified: Secondary | ICD-10-CM | POA: Diagnosis not present

## 2022-07-05 LAB — POCT URINALYSIS DIP (MANUAL ENTRY)
Bilirubin, UA: NEGATIVE
Glucose, UA: NEGATIVE mg/dL
Ketones, POC UA: NEGATIVE mg/dL
Nitrite, UA: POSITIVE — AB
Protein Ur, POC: NEGATIVE mg/dL
Spec Grav, UA: 1.025 (ref 1.010–1.025)
Urobilinogen, UA: 0.2 E.U./dL
pH, UA: 5.5 (ref 5.0–8.0)

## 2022-07-05 MED ORDER — FLEET ENEMA 7-19 GM/118ML RE ENEM: 1.0000 | ENEMA | Freq: Every day | RECTAL | 0 refills | Status: DC | PRN

## 2022-07-05 MED ORDER — CEPHALEXIN 500 MG PO CAPS: 500.0000 mg | ORAL_CAPSULE | Freq: Two times a day (BID) | ORAL | 0 refills | Status: DC

## 2022-07-05 NOTE — ED Provider Notes (Signed)
[place]   MRN: [id number] DOB: 21-Dec-1950  Subjective:   Marilyn Myers is a 71 y.o. female presenting for 3-week history of persistent difficulty with constipation.  Patient has now had worsening left lower abdominal and pelvic pain.  She is concerned about a possible UTI versus diverticulitis.  Last bowel movement was yesterday and was very small amount, hard.  She takes a stool softener daily.  Has previously done Linzess.  No fever, nausea, vomiting, dysuria, urinary frequency.  However, she reports that generally she will have abdominal pain with UTIs as well.  Has actually been battling 1 very consistently this past year.  Last dosing of antibiotics was mid to the end of July.  She was prescribed Augmentin and based off the urine culture was susceptible.  She has seen an urologist but states that she was dismissed because they could not find anything wrong.  Patient admits that she does not hydrate well with water.  She primarily drinks diet sodas and flavored waters.  No current facility-administered medications for this encounter.  Current Outpatient Medications:    BESIVANCE 0.6 % SUSP, Apply 1 drop to eye 3 (three) times daily., Disp: , Rfl:    clotrimazole (LOTRIMIN) 1 % cream, Apply 1 application topically 2 (two) times daily., Disp: 30 g, Rfl: 1   clotrimazole-betamethasone (LOTRISONE) cream, Apply 1 application topically daily., Disp: 30 g, Rfl: 0   Cyanocobalamin (VITAMIN B 12 PO), Take 1 tablet by mouth at bedtime., Disp: , Rfl:    diltiazem (CARDIZEM CD) 120 MG 24 hr capsule, TAKE ONE CAPSULE BY MOUTH DAILY, Disp: 90 capsule, Rfl: 3   doxepin (SINEQUAN) 25 MG capsule, Take 1 capsule (25 mg total) by mouth at bedtime., Disp: 90 capsule, Rfl: 3   DULoxetine (CYMBALTA) 60 MG capsule, Take 1 capsule (60 mg total) by mouth daily., Disp: 90 capsule, Rfl: 3   DUREZOL 0.05 % EMUL, Place 1 drop into the right eye 3 (three) times daily., Disp: , Rfl:     EYSUVIS 0.25 % SUSP, Apply 1 drop to eye 4 (four) times daily., Disp: , Rfl:    ezetimibe (ZETIA) 10 MG tablet, Take 1 tablet (10 mg total) by mouth daily., Disp: 90 tablet, Rfl: 3   gabapentin (NEURONTIN) 800 MG tablet, TAKE TWO TABLETS BY MOUTH THREE TIMES A DAY, Disp: 540 tablet, Rfl: 3   losartan-hydrochlorothiazide (HYZAAR) 100-25 MG tablet, Take 1 tablet by mouth daily., Disp: 90 tablet, Rfl: 3   metFORMIN (GLUCOPHAGE) 1000 MG tablet, Take 1 tablet (1,000 mg total) by mouth 2 (two) times daily., Disp: 180 tablet, Rfl: 3   pantoprazole (PROTONIX) 20 MG tablet, TAKE 1 TO 2 TABLETS (20-40 MG TOTAL) BY MOUTH DAILY., Disp: 180 tablet, Rfl: 3   potassium chloride SA (KLOR-CON M) 20 MEQ tablet, Take 1 tablet (20 mEq total) by mouth daily., Disp: 90 tablet, Rfl: 3   sulfamethoxazole-trimethoprim (BACTRIM DS) 800-160 MG tablet, Take 1 tablet by mouth 2 (two) times daily for 7 days, THEN 1 tablet daily., Disp: 90 tablet, Rfl: 1   Allergies  Allergen Reactions   Ace Inhibitors Cough   Codeine Nausea And Vomiting   Flomax [Tamsulosin Hcl] Itching    Past Medical History:  Diagnosis Date   Anxiety    Diverticulitis of intestine without perforation or abscess    Diverticulosis, sigmoid    GERD (gastroesophageal reflux disease)    History of diverticulitis of colon    09/ 2015  RESOLVED  History of kidney stones    Hypertension    Nephrolithiasis    BILATERAL   OSA on CPAP    no longer uses    Right ureteral stone    Seasonal allergic rhinitis 01/24/2018   Type 2 diabetes mellitus ([place])    type 2      Past Surgical History:  Procedure Laterality Date   BARIATRIC SURGERY     CERVICAL CONE BIOPSY  1976   COLON RESECTION     colostomy x 6 months   CYSTO/  BILATERAL RETROGRADE PYELOGRAM/  PLACEMENT BILATERAL URETERAL STENTS  07-10-2011   CYSTOSCOPY WITH RETROGRADE PYELOGRAM, URETEROSCOPY AND STENT PLACEMENT Right 10/11/2014   Procedure: CYSTOSCOPY WITH RETROGRADE PYELOGRAM,  URETEROSCOPY, right ureteral biopsy, AND STENT PLACEMENT;  Surgeon: Arvil Persons, MD;  Location: [facility];  Service: Urology;  Laterality: Right;   CYSTOSCOPY/RETROGRADE/URETEROSCOPY  10/08/2011   Procedure: CYSTOSCOPY/RETROGRADE/URETEROSCOPY;  Surgeon: Hanley Ben, MD;  Location[facility];  Service: Urology;  Laterality: N/A;  CYSTOSCOPY, RIGHT  RETROGRADE, RIGHT URETEROSCOPY HOLMIUM LASER WITH BASKET STONE EXTRACTION AND RIGHT URETERAL STENT PLACEMENT   CYSTOSCOPY/URETEROSCOPY/HOLMIUM LASER/STENT PLACEMENT Right 02/20/2021   Procedure: CYSTOSCOPY RETROGRADE/URETEROSCOPY/HOLMIUM LASER/STENT PLACEMENT/BASKET STONE EXTRACTION;  Surgeon: Janith Lima, MD;  Location: [facility];  Service: Urology;  Laterality: Right;  ONLY NEEDS 30 MIN   EXTRACORPOREAL SHOCK WAVE LITHOTRIPSY  x3  last one 01-24-2012   TUBAL LIGATION  1987   URETEROSCPIC STONE EXTRACTION/ STENT PLACEMENT  left 08-20-2011    Family History  Problem Relation Age of Onset   Breast cancer Mother        52   Renal Disease Mother    Heart disease Mother    Diabetes Paternal Grandmother    Diabetes Maternal Aunt     Social History   Tobacco Use   Smoking status: Former    Packs/day: 1.00    Years: 25.00    Total pack years: 25.00    Types: Cigarettes    Quit date: 10/07/1996    Years since quitting: 25.7   Smokeless tobacco: Never  Vaping Use   Vaping Use: Never used  Substance Use Topics   Alcohol use: Never   Drug use: No    ROS   Objective:   Vitals: BP 139/88   Pulse 88   Temp 98.1 F (36.7 C)   Resp 20   SpO2 98%   Physical Exam Constitutional:      General: She is not in acute distress.    Appearance: Normal appearance. She is well-developed. She is not ill-appearing, toxic-appearing or diaphoretic.  HENT:     Head: Normocephalic and atraumatic.     Nose: Nose normal.     Mouth/Throat:     Mouth: Mucous membranes are moist.     Pharynx: Oropharynx is clear.   Eyes:     General: No scleral icterus.       Right eye: No discharge.        Left eye: No discharge.     Extraocular Movements: Extraocular movements intact.     Conjunctiva/sclera: Conjunctivae normal.  Cardiovascular:     Rate and Rhythm: Normal rate.  Pulmonary:     Effort: Pulmonary effort is normal.  Abdominal:     General: Bowel sounds are normal. There is no distension.     Palpations: Abdomen is soft. There is no mass.     Tenderness: There is abdominal tenderness in the suprapubic area and left lower quadrant. There  is no right CVA tenderness, left CVA tenderness, guarding or rebound.  Skin:    General: Skin is warm and dry.  Neurological:     General: No focal deficit present.     Mental Status: She is alert and oriented to person, place, and time.  Psychiatric:        Mood and Affect: Mood normal.        Behavior: Behavior normal.        Thought Content: Thought content normal.        Judgment: Judgment normal.     DG Abd 1 View  Result Date: 07/05/2022 CLINICAL DATA:  Constipation left lower quadrant pain EXAM: ABDOMEN - 1 VIEW COMPARISON:  04/26/2016 FINDINGS: Nonobstructive pattern of bowel gas, with gas and stool present to the rectum. Moderate burden of stool throughout the colon, mostly in the distal colon and rectum. No free air in the abdomen. IMPRESSION: Nonobstructive pattern of bowel gas, with gas and stool present to the rectum. Moderate burden of stool throughout the colon, mostly in the distal colon and rectum. Electronically Signed   By: Delanna Ahmadi M.D.   On: 07/05/2022 11:41     Results for orders placed or performed during the hospital encounter of 07/05/22 (from the past 24 hour(s))  POCT urinalysis dipstick     Status: Abnormal   Collection Time: 07/05/22 11:30 AM  Result Value Ref Range   Color, UA yellow yellow   Clarity, UA cloudy (A) clear   Glucose, UA negative negative mg/dL   Bilirubin, UA negative negative   Ketones, POC UA negative  negative mg/dL   Spec Grav, UA 1.025 1.010 - 1.025   Blood, UA trace-intact (A) negative   pH, UA 5.5 5.0 - 8.0   Protein Ur, POC negative negative mg/dL   Urobilinogen, UA 0.2 0.2 or 1.0 E.U./dL   Nitrite, UA Positive (A) Negative   Leukocytes, UA Large (3+) (A) Negative     Assessment and Plan :   PDMP not reviewed this encounter.  1. Constipation, unspecified constipation type   2. Abdominal pain, left lower quadrant   3. Acute cystitis without hematuria   4. History of diverticulitis    Patient is high risk for having acute abdomen.  Despite her abdominal tenderness on exam, we discussed managing for pain related to constipation and acute cystitis.  She is going to trial an enema at home to have a complete bowel movement.  Advised to increase fiber intake.  We will also start her on Keflex to address an ongoing urinary tract infection, urine culture pending.  Emphasized need to start hydrating with plain water.  Advised to avoid urinary irritants.  Maintain strict ER precautions as there is a distinct possibility that she may have recurrent diverticulitis.  For now given that she just finished a round of Augmentin we will hold off on prescribing this again.  Counseled patient on potential for adverse effects with medications prescribed/recommended today, ER and return-to-clinic precautions discussed, patient verbalized understanding.    Jaynee Eagles, PA-C 07/05/22 1253

## 2022-07-05 NOTE — ED Triage Notes (Signed)
Pt here with LLQ pain and constipation x 3weeks. Pt can only have BM if she takes a large amount of laxatives. Pt has history of diverticulitis.

## 2022-07-05 NOTE — Discharge Instructions (Addendum)
Please start Keflex to address an urinary tract infection. Make sure you hydrate very well with plain water and a quantity of 64 ounces of water a day.  Please limit drinks that are considered urinary irritants such as soda, sweet tea, coffee, energy drinks, alcohol.  These can worsen your urinary and genital symptoms but also be the source of them.  I will let you know about your urine culture results through MyChart to see if we need to prescribe or change your antibiotics based off of those results.   For moderate to severe constipation (not having a bowel movement in more than 3 days) then try to use an enema or Miralax once daily until you have a good bowel movement.  It is not a good idea to use an enema or laxatives daily. If you find you are doing this, then please follow up with a gastroenterologist. Otherwise, continue taking your stool softener.  Try to stay active physically including regular exercise 2-3 times a week.  Make sure you hydrate well every day with about 64 ounces of water daily (that is 2 liters).  Try to avoid carb heavy foods, dairy. This includes cutting out breads, pasta, pizza, pastries, potatoes, rice, starchy foods in general. Eat more fiber as listed below:  Salads - kale, spinach, cabbage, spring mix, arugula Fruits - avocadoes, berries (blueberries, raspberries, blackberries), apples, oranges, pomegranate, grapefruit, kiwi Vegetables - asparagus, cauliflower, broccoli, green beans, brussel sprouts, bell peppers, beets; stay away from or limit starchy vegetables like potatoes, carrots, peas Other general foods - kidney beans, egg whites, almonds, walnuts, sunflower seeds, pumpkin seeds, fat free yogurt, almond milk, flax seeds, quinoa, oats  Meat - It is better to eat lean meats and limit your red meat including pork to once a week.  Wild caught fish, chicken breast are good options as they tend to be leaner sources of good protein. Still be mindful of the sodium labels  for the meats you buy.  DO NOT EAT ANY FOODS ON THIS LIST THAT YOU ARE ALLERGIC TO. For more specific needs, I highly recommend consulting a dietician or nutritionist but this can definitely be a good starting point.

## 2022-07-08 LAB — URINE CULTURE: Culture: >=100000 — AB

## 2022-07-13 ENCOUNTER — Telehealth: Payer: Medicare Other

## 2022-07-16 ENCOUNTER — Telehealth: Payer: Medicare Other

## 2022-07-23 ENCOUNTER — Ambulatory Visit: Payer: Medicare Other | Admitting: Pharmacist

## 2022-07-23 DIAGNOSIS — E1142 Type 2 diabetes mellitus with diabetic polyneuropathy: Secondary | ICD-10-CM

## 2022-07-23 NOTE — Progress Notes (Signed)
Chronic Care Management Pharmacy Note  07/23/2022 Name:  Marilyn Myers MRN:  [id number] DOB:  [birth year]  Subjective: Marilyn Myers is an 71 y.o. year old female who is a primary patient of [name], MD.  The CCM team was consulted for assistance with disease management and care coordination needs.    Engaged with patient by telephone for follow up visit in response to provider referral for pharmacy case management and/or care coordination services.   Consent to Services:  The patient was given information about Chronic Care Management services, agreed to services, and gave verbal consent prior to initiation of services.  Please see initial visit note for detailed documentation.   Patient Care Team: [name], MD as PCP - General (Family Medicine) Dasher, Jeneen Rinks, MD (Surgery) Princess Bruins, MD as Consulting Physician (Obstetrics and Gynecology) Idolina Primer [facility]) Harriett Sine, MD as Consulting Physician (Dermatology) Roche, Margel Joens Mate, PA-C as Physician Assistant (Pain Medicine) Edythe Clarity, [facility] (Pharmacist)  Hospital visits: Medication Reconciliation was completed by comparing discharge summary, patient's EMR and Pharmacy list, and upon discussion with patient.  Admitted to the hospital on 03/11/2021-03/13/2021 due to suicide attempt.  New?Medications Started at [facility] Discharge:?? -started cymbalta 20 mg BID due to neuropathy -diazepam 5 mg TID for muscle spasms   Medications Discontinued at Hospital Discharge: -Stopped all opioids  Medications that remain the same after Hospital Discharge:??  -All other medications will remain the same.    Objective:  Lab Results  Component Value Date   CREATININE 0.97 05/12/2022   CREATININE 1.01 02/22/2022   CREATININE 1.07 02/05/2022    Lab Results  Component Value Date   HGBA1C 6.3 (A) 05/12/2022   Last diabetic Eye exam:  Lab Results  Component Value Date/Time   HMDIABEYEEXA No  Retinopathy 11/02/2021 12:00 AM    Last diabetic Foot exam: No results found for: "HMDIABFOOTEX"      Component Value Date/Time   CHOL 148 05/12/2022 0947   TRIG 166.0 (H) 05/12/2022 0947   HDL 44.60 05/12/2022 0947   CHOLHDL 3 05/12/2022 0947   VLDL 33.2 05/12/2022 0947   LDLCALC 70 05/12/2022 0947   LDLDIRECT 144.0 02/05/2022 1125       Latest Ref Rng & Units 05/12/2022    9:47 AM 02/05/2022   11:25 AM 09/22/2021    3:16 PM  Hepatic Function  Total Protein 6.0 - 8.3 g/dL 7.1  7.6  7.0   Albumin 3.5 - 5.2 g/dL 3.8  4.1    AST 0 - 37 U/L 17  14    ALT 0 - 35 U/L 13  10    Alk Phosphatase 39 - 117 U/L 85  91    Total Bilirubin 0.2 - 1.2 mg/dL 0.8  0.8      Lab Results  Component Value Date/Time   TSH 1.51 02/05/2022 11:25 AM   TSH 1.38 07/01/2021 10:15 AM       Latest Ref Rng & Units 02/05/2022   11:25 AM 07/01/2021   10:15 AM 03/11/2021    4:45 PM  CBC  WBC 4.0 - 10.5 K/uL 9.7  7.4  12.3   Hemoglobin 12.0 - 15.0 g/dL 13.1  12.2  11.8   Hematocrit 36.0 - 46.0 % 39.8  37.5  36.5   Platelets 150.0 - 400.0 K/uL 317.0  282.0  303     Lab Results  Component Value Date/Time   VD25OH 51.36 07/01/2021 10:15 AM   VD25OH 43.9 11/06/2018  12:00 AM    Clinical ASCVD: No  The 10-year ASCVD risk score (Arnett DK, et al., 2019) is: 33.9%   Values used to calculate the score:     Age: 13 years     Sex: Female     Is Non-Hispanic African American: No     Diabetic: Yes     Tobacco smoker: No     Systolic Blood Pressure: 176 mmHg     Is BP treated: Yes     HDL Cholesterol: 44.6 mg/dL     Total Cholesterol: 148 mg/dL    Social History   Tobacco Use  Smoking Status Former   Packs/day: 1.00   Years: 25.00   Total pack years: 25.00   Types: Cigarettes   Quit date: 10/07/1996   Years since quitting: 25.8  Smokeless Tobacco Never   BP Readings from Last 3 Encounters:  07/05/22 139/88  06/02/22 100/72  05/13/22 110/70   Pulse Readings from Last 3 Encounters:   07/05/22 88  06/02/22 (!) 108  05/13/22 96   Wt Readings from Last 3 Encounters:  06/02/22 180 lb (81.6 kg)  05/13/22 182 lb 6.4 oz (82.7 kg)  05/12/22 182 lb 6.4 oz (82.7 kg)    Assessment: Review of patient past medical history, allergies, medications, health status, including review of consultants reports, laboratory and other test data, was performed as part of comprehensive evaluation and provision of chronic care management services.   SDOH:  (Social Determinants of Health) assessments and interventions performed: No done within the last year SDOH Interventions    Flowsheet Row Chronic Care Management from 07/08/2020 in [place] Interventions Other (Comment)  Transportation Interventions Other (Comment)      Financial Resource Strain: Low Risk  (12/17/2021)   Overall Financial Resource Strain (CARDIA)    Difficulty of Paying Living Expenses: Not hard at all     [place]  Allergies  Allergen Reactions   Ace Inhibitors Cough   Codeine Nausea And Vomiting   Flomax [Tamsulosin Hcl] Itching    Medications Reviewed Today     Reviewed by Edythe Clarity, [facility] (Pharmacist) on 07/23/22 at [place] List Status: <None>   Medication Order Taking? Sig Documenting Provider Last Dose Status Informant  BESIVANCE 0.6 % SUSP [id number] Yes Apply 1 drop to eye 3 (three) times daily. [provider] Taking Active            Med Note Deri Fuelling Feb 22, 2022  9:37 AM) Post procedure 02/23/2022.  cephALEXin (KEFLEX) 500 MG capsule [id number] Yes Take 1 capsule (500 mg total) by mouth 2 (two) times daily. Jaynee Eagles, PA-C Taking Active   clotrimazole (LOTRIMIN) 1 % cream [id number] Yes Apply 1 application topically 2 (two) times daily. Tawnya Crook, MD Taking Active   clotrimazole-betamethasone Donalynn Furlong) cream [id number] Yes Apply 1 application topically daily. Tawnya Crook, MD Taking  Active   Cyanocobalamin (VITAMIN B 12 PO) [id number] Yes Take 1 tablet by mouth at bedtime. [provider] Taking Active   diltiazem (CARDIZEM CD) 120 MG 24 hr capsule [id number] Yes TAKE ONE CAPSULE BY MOUTH DAILY [name], MD Taking Active   doxepin (SINEQUAN) 25 MG capsule [id number] Yes Take 1 capsule (25 mg total) by mouth at bedtime. [name], MD Taking Active   DULoxetine (CYMBALTA) 60 MG capsule [id number] Yes Take 1 capsule (60 mg total) by mouth daily. Jonni Sanger,  Karie Fetch, MD Taking Active   DUREZOL 0.05 % EMUL [id number] Yes Place 1 drop into the right eye 3 (three) times daily. [provider] Taking Active            Med Note Deri Fuelling Feb 22, 2022  9:37 AM) Post procedure 02/23/2022.  EYSUVIS 0.25 % SUSP [id number] Yes Apply 1 drop to eye 4 (four) times daily. [provider] Taking Active            Med Note Deri Fuelling Feb 22, 2022  9:37 AM) Post procedure 02/23/2022.  ezetimibe (ZETIA) 10 MG tablet [id number] Yes Take 1 tablet (10 mg total) by mouth daily. [name], MD Taking Active   gabapentin (NEURONTIN) 800 MG tablet [id number] Yes TAKE TWO TABLETS BY MOUTH THREE TIMES A DAY [name], MD Taking Active   losartan-hydrochlorothiazide [facility]) 100-25 MG tablet [id number] Yes Take 1 tablet by mouth daily. [name], MD Taking Active   metFORMIN (GLUCOPHAGE) 1000 MG tablet [id number] Yes Take 1 tablet (1,000 mg total) by mouth 2 (two) times daily. [name], MD Taking Active   pantoprazole (PROTONIX) 20 MG tablet [id number] Yes TAKE 1 TO 2 TABLETS (20-40 MG TOTAL) BY MOUTH DAILY. [name], MD Taking Active   potassium chloride SA (KLOR-CON M) 20 MEQ tablet [id number] Yes Take 1 tablet (20 mEq total) by mouth daily. [name], MD Taking Active   sodium phosphate (FLEET) 7-19 GM/118ML ENEM [id number] Yes Place 133 mLs (1 enema total) rectally daily as needed for severe constipation or moderate  constipation. Jaynee Eagles, PA-C Taking Active   sulfamethoxazole-trimethoprim (BACTRIM DS) 800-160 MG tablet [id number] Yes Take 1 tablet by mouth 2 (two) times daily for 7 days, THEN 1 tablet daily. [name], MD Taking Active             Patient Active Problem List   Diagnosis Date Noted   Secondary insomnia 06/02/2022   Combined hyperlipidemia associated with type 2 diabetes mellitus ([place]) 05/12/2022   Major depression, recurrent, chronic ([place])    History of suicide attempt - 2022 opiod overdose    Gastroesophageal reflux disease without esophagitis 01/07/2020   Obesity (BMI 30-39.9) 02/22/2019   Refusal of statin medication by patient 01/24/2018   Seasonal allergic rhinitis 01/24/2018   PVC's (premature ventricular contractions) 10/08/2016   Diabetic peripheral neuropathy ([place]) 09/28/2016   S/P colostomy takedown 07/02/2015   History of osteopenia 05/19/2015   Essential hypertension 01/28/2015   Diverticulitis of large intestine 08/19/2014   Hydradenitis 08/19/2014   Nephrolithiasis 08/19/2014   Obstructive sleep apnea 08/01/2014   Controlled type 2 diabetes mellitus with diabetic polyneuropathy, without long-term current use of insulin ([place]) 06/10/2014    Immunization History  Administered Date(s) Administered   Fluad Quad(high Dose 65+) 08/27/2019, 09/22/2021   Influenza, High Dose Seasonal PF 08/19/2014, 09/02/2015, 10/08/2016, 09/22/2017, 08/29/2018   Influenza-Unspecified 10/13/2020   PFIZER(Purple Top)SARS-COV-2 Vaccination 12/29/2019, 01/18/2020, 10/13/2020   Pneumococcal Conjugate-13 10/18/2016   Pneumococcal Polysaccharide-23 08/19/2014    Conditions to be addressed/monitored: MDD HTN HLD Osteopenia GERD Obesity   Care Plan : [place]  Updates made by Edythe Clarity, RPH since 07/23/2022 12:00 AM     Problem: HTN DM Osteopenia MDD PVCs T2DM statin intolerance OSA Neuropathy   Note:   HTN DM Osteoporosis    Long-Range Goal: Disease  Management   Start Date: 01/07/2021  Expected End Date: 01/07/2022  Recent Progress: Not on track  Priority: High  Note:   Pharmacist Clinical Goal(s):  Patient will contact provider office for questions/concerns as evidenced notation of same in electronic health record through collaboration with PharmD and provider.   Interventions: 1:1 collaboration with [name], MD regarding development and update of comprehensive plan of care as evidenced by provider attestation and co-signature Inter-disciplinary care team collaboration (see longitudinal plan of care) Comprehensive medication review performed; medication list updated in electronic medical record  Neuropathic Pain (Goal: minimize pain, optimize medication safety)07/23/22 -Controlled, patient states her pain is much improved --Has been taken off of all opioids following suicide attempt.  -Current treatment  Duloxetine 60 mg twice daily Appropriate, Effective, Safe, Accessible Gabapentin 800 mg tablet -2 tablets three times per day Appropriate, Effective, Safe, Accessible -Encouraged family to reach out to local pain mgmt due to upcoming transition to [place], provided provider information below for pain and psych needs -Gabapentin and Cymbalta have both been increased.  Gabapentin above max dose but pain is well controlled and she is not on any opioids. No changes at this time, able to do ADLs on current dose of pain medications.  QOL is improved with these dose.  Diabetes (A1c goal <7%) 07/23/22 -Controlled, most recent A1c is 6.3% -Current medications: Metformin 1072m twice daily Appropriate, Effective, Safe, Accessible -Medications previously tried: none noted  -Current home glucose readings fasting glucose: unknown post prandial glucose: unknown -Denies hypoglycemic/hyperglycemic symptoms -Current meal patterns:  Was eating lots of beans, counseled carbohydrate content in beans.  She is increasing her water and has cut back  on her sodas.  Counseled on adding Miralax to her water to aid in constipation. -Educated on A1c and blood sugar goals; Prevention and management of hypoglycemic episodes; Benefits of routine self-monitoring of blood sugar; -Counseled to check feet daily and get yearly eye exams -A1c remains controlled, no changes to meds needed continue routine screenings.  MDD PHQ not assessed during today's visit, not assessed -[facility] referral - pt/son had contacted them this am, however [facility] [place] the next [place].  -Patient's son DTimmothy Soursis currently coordinating with Sister on best facilities to use for psych and pain needs. I provided patient with a list of providers accepting medicare insurance and new patients. Through [facility] if wanting to further consider referrals -Pain MFrancis Gaines MD  [place] [place] [address] Phone ([phone] ([phone]-Psychiatry  EKennieth Francois MD [facility] [address] [address] ([phone] Care Coordination -provided above provider information to help with [place] care coordination -reviewed discharge medications at length, answered questions related to antibiotic therapy, gabapentin, cymbalta -Will f/u early may to ensure coordination needs are met  Patient Goals/Self-Care Activities Patient will:  - collaborate with provider on medication access solutions -reach out with any medication/care coordination related questions  Follow Up Plan: CCM FU call 6 months Medication Assistance: None required.  Patient affirms current coverage meets needs.  [place]Phone: [phone]: [phone]       Compliance/Adherence/Medication fill history: Care Gaps: None  Star-Rating Drugs: Losartan/HCTZ 100-288m07/30/23 90ds 05/19/22 Metformin 100060m0ds Follow Up:  Patient agrees to Care Plan  and Follow-up.  Future Appointments  Date Time Provider [place]  08/16/2022  9:30 AM [name], MD [facility] [place] [facility]  12/23/2022  3:15 PM [facility] HEALTH COACH [facility]

## 2022-07-23 NOTE — Patient Instructions (Addendum)
Visit Information   Goals Addressed             This Visit's Progress    Lifestyle Change-Hypertension   On track    Timeframe:  Long-Range Goal Priority:  High Start Date: 01/07/21                       Expected End Date: 01/07/22                       Follow Up Date 04/21/2021  - agree to work together to make changes - ask questions to understand - learn about high blood pressure    Why is this important?   The changes that you are asked to make may be hard to do.  This is especially true when the changes are life-long.  Knowing why it is important to you is the first step.  Working on the change with your family or support person helps you not feel alone.  Reward yourself and family or support person when goals are met. This can be an activity you choose like bowling, hiking, biking, swimming or shooting hoops.     Notes:        Patient Care Plan: CCM Pharmacy Care Plan     Problem Identified: HTN DM Osteopenia MDD PVCs T2DM statin intolerance OSA Neuropathy   Note:   HTN DM Osteoporosis    Long-Range Goal: Disease Management   Start Date: 01/07/2021  Expected End Date: 01/07/2022  Recent Progress: Not on track  Priority: High  Note:   Pharmacist Clinical Goal(s):  Patient will contact provider office for questions/concerns as evidenced notation of same in electronic health record through collaboration with PharmD and provider.   Interventions: 1:1 collaboration with [name], MD regarding development and update of comprehensive plan of care as evidenced by provider attestation and co-signature Inter-disciplinary care team collaboration (see longitudinal plan of care) Comprehensive medication review performed; medication list updated in electronic medical record  Neuropathic Pain (Goal: minimize pain, optimize medication safety)07/23/22 -Controlled, patient states her pain is much improved --Has been taken off of all opioids following suicide attempt.   -Current treatment  Duloxetine 60 mg twice daily Appropriate, Effective, Safe, Accessible Gabapentin 800 mg tablet -2 tablets three times per day Appropriate, Effective, Safe, Accessible -Encouraged family to reach out to local pain mgmt due to upcoming transition to [place], provided provider information below for pain and psych needs -Gabapentin and Cymbalta have both been increased.  Gabapentin above max dose but pain is well controlled and she is not on any opioids. No changes at this time, able to do ADLs on current dose of pain medications.  QOL is improved with these dose.  Diabetes (A1c goal <7%) 07/23/22 -Controlled, most recent A1c is 6.3% -Current medications: Metformin 1043m twice daily Appropriate, Effective, Safe, Accessible -Medications previously tried: none noted  -Current home glucose readings fasting glucose: unknown post prandial glucose: unknown -Denies hypoglycemic/hyperglycemic symptoms -Current meal patterns:  Was eating lots of beans, counseled carbohydrate content in beans.  She is increasing her water and has cut back on her sodas.  Counseled on adding Miralax to her water to aid in constipation. -Educated on A1c and blood sugar goals; Prevention and management of hypoglycemic episodes; Benefits of routine self-monitoring of blood sugar; -Counseled to check feet daily and get yearly eye exams -A1c remains controlled, no changes to meds needed continue routine screenings.  MDD PHQ not assessed during  today's visit, not assessed -[facility] referral - pt/son had contacted them this am, however are opting to find [facility] provider in [place] as they are planning on patient living there for the next month.  -Patient's son Timmothy Sours is currently coordinating with Sister on best facilities to use for psych and pain needs. I provided patient with a list of providers accepting medicare insurance and new patients. Through [facility] if wanting to further consider  referrals -Pain Francis Gaines, MD  [place] [address], [place] [address]  Phone [phone] Fax [phone] -Psychiatry  Kennieth Francois, MD [facility] [place] [address]  [phone]  Care Coordination -provided above provider information to help with [place] care coordination -reviewed discharge medications at length, answered questions related to antibiotic therapy, gabapentin, cymbalta -Will f/u early may to ensure coordination needs are met  Patient Goals/Self-Care Activities Patient will:  - collaborate with provider on medication access solutions -reach out with any medication/care coordination related questions  Follow Up Plan: CCM FU call 6 months Medication Assistance: None required.  Patient affirms current coverage meets needs.  Patient's preferred pharmacy is:  [facility] [place], [place] [address] Phone: [phone] Fax: [phone]         The patient verbalized understanding of instructions, educational materials, and care plan provided today and DECLINED offer to receive copy of patient instructions, educational materials, and care plan.  Telephone follow up appointment with pharmacy team member scheduled for: 6 months  CLAYTON JARMON, [place], PharmD Clinical Pharmacist  [facility] [phone]

## 2022-08-11 ENCOUNTER — Ambulatory Visit: Payer: Medicare Other | Admitting: Family Medicine

## 2022-08-16 ENCOUNTER — Ambulatory Visit (INDEPENDENT_AMBULATORY_CARE_PROVIDER_SITE_OTHER): Payer: Medicare Other | Admitting: Family Medicine

## 2022-08-16 ENCOUNTER — Encounter: Payer: Self-pay | Admitting: Family Medicine

## 2022-08-16 VITALS — BP 100/70 | HR 97 | Temp 97.8°F | Ht 66.0 in | Wt 185.6 lb

## 2022-08-16 DIAGNOSIS — E1142 Type 2 diabetes mellitus with diabetic polyneuropathy: Secondary | ICD-10-CM | POA: Diagnosis not present

## 2022-08-16 DIAGNOSIS — R8271 Bacteriuria: Secondary | ICD-10-CM | POA: Diagnosis not present

## 2022-08-16 DIAGNOSIS — I10 Essential (primary) hypertension: Secondary | ICD-10-CM

## 2022-08-16 DIAGNOSIS — K5900 Constipation, unspecified: Secondary | ICD-10-CM | POA: Diagnosis not present

## 2022-08-16 DIAGNOSIS — N390 Urinary tract infection, site not specified: Secondary | ICD-10-CM | POA: Diagnosis not present

## 2022-08-16 DIAGNOSIS — Z23 Encounter for immunization: Secondary | ICD-10-CM | POA: Diagnosis not present

## 2022-08-16 LAB — BASIC METABOLIC PANEL
BUN: 24 mg/dL — ABNORMAL HIGH (ref 6–23)
CO2: 31 mEq/L (ref 19–32)
Calcium: 9.9 mg/dL (ref 8.4–10.5)
Chloride: 100 mEq/L (ref 96–112)
Creatinine, Ser: 0.99 mg/dL (ref 0.40–1.20)
GFR: 56.65 mL/min — ABNORMAL LOW (ref >60.00)
Glucose, Bld: 134 mg/dL — ABNORMAL HIGH (ref 70–99)
Potassium: 4.3 mEq/L (ref 3.5–5.1)
Sodium: 141 mEq/L (ref 135–145)

## 2022-08-16 LAB — POCT GLYCOSYLATED HEMOGLOBIN (HGB A1C): Hemoglobin A1C: 6.5 % — AB (ref 4.0–5.6)

## 2022-08-16 NOTE — Progress Notes (Signed)
Subjective  CC:  Chief Complaint  Patient presents with   Diabetes    Pt here to F/U with Dm and also having some constipation    HPI: Marilyn Myers is a 71 y.o. female who presents to the office today for follow up of diabetes and problems listed above in the chief complaint.  Diabetes follow up: Her diabetic control is reported as Unchanged. Doing well  She denies exertional CP or SOB or symptomatic hypoglycemia. She denies foot sores and high-dose gabapentin continues to work well for her peripheral neuropathy pain.  Eye exam up-to-date.  On ARB.  Refuses statin.  Eligible for flu shot. Hypertension is well controlled.  Running a little low today without any symptoms of low blood pressure.  No tachycardia palpitations or lightheadedness.  Compliant with medications.  Has hypokalemia on Hyzaar and 20 mEq of potassium daily.  Due for recheck. Complains of constipation: Reviewed recent urgent care note.  Small hard to pass stool.  Using stool softener twice daily.  Intermittent MiraLAX.  Worried because of lower abdominal soreness during the episodes of constipation.  Has history of colon surgery.  No blood in the stool.  No mucus in the stool.  No fevers or chills.  No nausea or vomiting. Reviewed urology notes.  History of recurrent UTIs but also likely he thinks has asymptomatic bacteriuria.  Today she has a smell which is more chronic but no urinary irritative symptoms.  Reviewed urgent care visit with positive urine culture but she does not think she had symptoms at that time.  No longer on antibiotic prophylaxis.  Wt Readings from Last 3 Encounters:  08/16/22 185 lb 9.6 oz (84.2 kg)  06/02/22 180 lb (81.6 kg)  05/13/22 182 lb 6.4 oz (82.7 kg)    BP Readings from Last 3 Encounters:  08/16/22 100/70  07/05/22 139/88  06/02/22 100/72    Assessment  1. Controlled type 2 diabetes mellitus with diabetic polyneuropathy, without long-term current use of insulin ([place])   2. Recurrent  UTI   3. Asymptomatic bacteriuria   4. Essential hypertension   5. Constipation, unspecified constipation type   6. Need for immunization against influenza      Plan  Diabetes is currently very well controlled.  Continue metformin twice daily.  Flu shot today Discussed recurrent UTI, persistent UTI versus asymptomatic bacteriuria.  No urine culture today.  She will return if she has urinary irritative or symptoms. Hypertension: Well-controlled.  Monitor for lows.  Continue Hyzaar and Cardizem 120 daily.  Recheck renal function, electrolytes.  Continue potassium supplement. Constipation: Recommend MiraLAX daily with stool softener.  If lower abdominal pain persist, then can refer back to GI.  Formally saw digestive health specialist.  Would prefer to stay in [place] at this point.  Last colonoscopy was 2019.  Follow up: 6 months for complete physical and recheck. Orders Placed This Encounter  Procedures   Flu Vaccine QUAD High Dose(Fluad)   Basic metabolic panel   POCT HgB A1C   No orders of the defined types were placed in this encounter.     Immunization History  Administered Date(s) Administered   Fluad Quad(high Dose 65+) 08/27/2019, 09/22/2021, 08/16/2022   Influenza, High Dose Seasonal PF 08/19/2014, 09/02/2015, 10/08/2016, 09/22/2017, 08/29/2018   Influenza-Unspecified 10/13/2020   PFIZER(Purple Top)SARS-COV-2 Vaccination 12/29/2019, 01/18/2020, 10/13/2020   Pneumococcal Conjugate-13 10/18/2016   Pneumococcal Polysaccharide-23 08/19/2014    Diabetes Related Lab Review: Lab Results  Component Value Date   HGBA1C 6.5 (A) 08/16/2022  HGBA1C 6.3 (A) 05/12/2022   HGBA1C 7.5 (H) 02/05/2022    Lab Results  Component Value Date   MICROALBUR 7.5 (H) 02/05/2022   Lab Results  Component Value Date   CREATININE 0.97 05/12/2022   BUN 23 05/12/2022   NA 137 05/12/2022   K 3.3 (L) 05/12/2022   CL 99 05/12/2022   CO2 30 05/12/2022   Lab Results  Component Value Date    CHOL 148 05/12/2022   CHOL 212 (H) 02/05/2022   CHOL 197 01/16/2021   Lab Results  Component Value Date   HDL 44.60 05/12/2022   HDL 54.30 02/05/2022   HDL 41.60 01/16/2021   Lab Results  Component Value Date   LDLCALC 70 05/12/2022   LDLCALC 87 11/06/2018   Lab Results  Component Value Date   TRIG 166.0 (H) 05/12/2022   TRIG 224.0 (H) 02/05/2022   TRIG 285.0 (H) 01/16/2021   Lab Results  Component Value Date   CHOLHDL 3 05/12/2022   CHOLHDL 4 02/05/2022   CHOLHDL 5 01/16/2021   Lab Results  Component Value Date   LDLDIRECT 144.0 02/05/2022   LDLDIRECT 117.0 01/16/2021   LDLDIRECT 115.0 08/27/2019   The 10-year ASCVD risk score (Arnett DK, et al., 2019) is: 19.2%   Values used to calculate the score:     Age: 74 years     Sex: Female     Is Non-Hispanic African American: No     Diabetic: Yes     Tobacco smoker: No     Systolic Blood Pressure: 024 mmHg     Is BP treated: Yes     HDL Cholesterol: 44.6 mg/dL     Total Cholesterol: 148 mg/dL I have reviewed the PMH, Fam and Soc history. Patient Active Problem List   Diagnosis Date Noted   Secondary insomnia 06/02/2022   Combined hyperlipidemia associated with type 2 diabetes mellitus ([place]) 05/12/2022   Major depression, recurrent, chronic ([place])    History of suicide attempt - 2022 opiod overdose    Gastroesophageal reflux disease without esophagitis 01/07/2020   Obesity (BMI 30-39.9) 02/22/2019   Refusal of statin medication by patient 01/24/2018    Husband has problems with this medication for her    Seasonal allergic rhinitis 01/24/2018   PVC's (premature ventricular contractions) 10/08/2016   Diabetic peripheral neuropathy ([place]) 09/28/2016    Failed lyrica    S/P colostomy takedown 07/02/2015   History of osteopenia 05/19/2015     T = -1.4 Dexa 2016.  Dexa 2019 at GYN: normal at all sites. See records    Essential hypertension 01/28/2015   Diverticulitis of large intestine 08/19/2014    Overview:   2015    Hydradenitis 08/19/2014   Nephrolithiasis 08/19/2014    Overview:  Dr. Kellie Simmering    Obstructive sleep apnea 08/01/2014   Controlled type 2 diabetes mellitus with diabetic polyneuropathy, without long-term current use of insulin ([place]) 06/10/2014    Social History: Patient  reports that she quit smoking about 25 years ago. Her smoking use included cigarettes. She has a 25.00 pack-year smoking history. She has never used smokeless tobacco. She reports that she does not drink alcohol and does not use drugs.  Review of Systems: Ophthalmic: negative for eye pain, loss of vision or double vision Cardiovascular: negative for chest pain Respiratory: negative for SOB or persistent cough Gastrointestinal: negative for abdominal pain Genitourinary: negative for dysuria or gross hematuria MSK: negative for foot lesions Neurologic: negative for weakness or gait disturbance  Objective  Vitals: BP 100/70   Pulse 97   Temp 97.8 F (36.6 C)   Ht '5\' 6"'$  (1.676 m)   Wt 185 lb 9.6 oz (84.2 kg)   SpO2 97%   BMI 29.96 kg/m  General: well appearing, no acute distress  Psych:  Alert and oriented, normal mood and affect HEENT:  Normocephalic, atraumatic, moist mucous membranes, supple neck  Cardiovascular:  Nl S1 and S2, RRR without murmur, gallop or rub. no edema Respiratory:  Good breath sounds bilaterally, CTAB with normal effort, no rales Gastrointestinal: normal BS, soft, nontender     Diabetic education: ongoing education regarding chronic disease management for diabetes was given today. We continue to reinforce the ABC's of diabetic management: A1c (<7 or 8 dependent upon patient), tight blood pressure control, and cholesterol management with goal LDL < 100 minimally. We discuss diet strategies, exercise recommendations, medication options and possible side effects. At each visit, we review recommended immunizations and preventive care recommendations for diabetics and stress that good  diabetic control can prevent other problems. See below for this patient's data.   Commons side effects, risks, benefits, and alternatives for medications and treatment plan prescribed today were discussed, and the patient expressed understanding of the given instructions. Patient is instructed to call or message via MyChart if he/she has any questions or concerns regarding our treatment plan. No barriers to understanding were identified. We discussed Red Flag symptoms and signs in detail. Patient expressed understanding regarding what to do in case of urgent or emergency type symptoms.  Medication list was reconciled, printed and provided to the patient in AVS. Patient instructions and summary information was reviewed with the patient as documented in the AVS. This note was prepared with assistance of Dragon voice recognition software. Occasional wrong-word or sound-a-like substitutions may have occurred due to the inherent limitations of voice recognition software

## 2022-08-16 NOTE — Patient Instructions (Signed)
Please return in 6 months for your annual complete physical; please come fasting.   I will release your lab results to you on your MyChart account with further instructions. You may see the results before I do, but when I review them I will send you a message with my report or have my assistant call you if things need to be discussed. Please reply to my message with any questions. Thank you!   IF you develop urinary symptoms, then make an appointment with the urologist. For now, all seems well.  You may use miralax and a stool softener together daily.   If you have any questions or concerns, please don't hesitate to send me a message via MyChart or call the office at [phone]. Thank you for visiting with [place] today! It's our pleasure caring for you.   Asymptomatic Bacteriuria Asymptomatic bacteriuria is the presence of a large number of bacteria in the urine without the usual symptoms of burning or frequent urination. This is usually not harmful, and treatment may not be needed. A person with this condition will not be more likely to develop an infection in the future. What are the causes? This condition is caused by an increase in bacteria in the urine. This increase can be caused by: Bacteria entering the urinary tract, such as during sex. A blockage in the urinary tract, such as from kidney stones or a tumor. Bladder problems that prevent the bladder from emptying. What increases the risk? You are more likely to develop this condition if: You have diabetes. You are an older adult. This especially affects older adults in long-term care facilities. You are pregnant and in the first trimester. You have kidney stones. You are female. You have had a kidney transplant. You have a leaky kidney tube valve (reflux). You had a urinary catheter for a long period of time. This is a long, thin tube that collects urine. What are the signs or symptoms? There are no symptoms of this condition. How  is this diagnosed? This condition is diagnosed with a urine test. Because this condition does not cause symptoms, it is usually diagnosed when a urine sample is taken to treat or diagnose another condition, such as pregnancy or kidney problems. Most women who are in their first trimester of pregnancy are screened for asymptomatic bacteriuria. How is this treated? Usually, treatment is not needed for this condition. Treating the condition can lead to other problems, such as a yeast infection or the growth of bacteria that do not respond to treatment (antibiotic-resistant bacteria). Some people do need treatment with antibiotic medicines to prevent kidney infection, known as pyelonephritis. Treatment is needed if: You are pregnant. In pregnant women, kidney infection can lead to: Early labor (premature labor). Very low birth weight (fetal growth restriction). Newborn death. You are having a procedure that affects the urinary tract. You have had a kidney transplant. If you are diagnosed with this condition, talk with your health care provider about any concerns that you have. Follow these instructions at home: Medicines Take over-the-counter and prescription medicines only as told by your health care provider. If you were prescribed an antibiotic medicine, take it as told by your health care provider. Do not stop using the antibiotic even if you start to feel better. General instructions Monitor your condition for any changes. Drink enough fluid to keep your urine pale yellow. Urinate more often to keep your bladder empty. If you are female, keep the area around your vagina and rectum  clean. Wipe from front to back after urinating or having a bowel movement. Use each piece of toilet paper only once. Keep all follow-up visits. This is important. Contact a health care provider if: You have symptoms of a urine infection, such as: A burning sensation, or pain when you urinate. A strong need to  urinate, or urinating more often. Urine turning discolored or cloudy. Blood in your urine. Urine that smells bad. Get help right away if: You develop signs of a kidney infection, such as: Back pain or pelvic pain. A fever or chills. Nausea or vomiting. Severe pain that cannot be controlled with medicine. Summary Asymptomatic bacteriuria is the presence of a large number of bacteria in the urine without the usual symptoms of burning or frequent urination. Usually, treatment is not needed for this condition. Treating the condition can lead to other problems, such as a yeast infection or the growth of bacteria that do not respond to treatment. Some people do need treatment. Treatment is needed if you are pregnant, if you are having a procedure that affects the urinary tract, or if you have had a kidney transplant. If you were prescribed an antibiotic medicine, take it as told by your health care provider. Do not stop using the antibiotic even if you start to feel better. This information is not intended to replace advice given to you by your health care provider. Make sure you discuss any questions you have with your health care provider. Document Revised: 06/20/2020 Document Reviewed: 06/20/2020 Elsevier Patient Education  [place].

## 2022-10-26 ENCOUNTER — Encounter: Payer: Self-pay | Admitting: Family Medicine

## 2022-11-03 ENCOUNTER — Ambulatory Visit (INDEPENDENT_AMBULATORY_CARE_PROVIDER_SITE_OTHER): Payer: Medicare Other | Admitting: Family Medicine

## 2022-11-03 VITALS — BP 124/80 | HR 96 | Temp 98.0°F | Ht 66.0 in | Wt 192.4 lb

## 2022-11-03 DIAGNOSIS — M4726 Other spondylosis with radiculopathy, lumbar region: Secondary | ICD-10-CM | POA: Diagnosis not present

## 2022-11-03 DIAGNOSIS — E1142 Type 2 diabetes mellitus with diabetic polyneuropathy: Secondary | ICD-10-CM | POA: Diagnosis not present

## 2022-11-03 DIAGNOSIS — M7061 Trochanteric bursitis, right hip: Secondary | ICD-10-CM | POA: Diagnosis not present

## 2022-11-03 DIAGNOSIS — M5441 Lumbago with sciatica, right side: Secondary | ICD-10-CM

## 2022-11-03 MED ORDER — PREDNISONE 10 MG PO TABS: ORAL_TABLET | ORAL | 0 refills | Status: DC

## 2022-11-03 NOTE — Patient Instructions (Signed)
Please follow up as scheduled for your next visit with me: 02/14/2023   If you have any questions or concerns, please don't hesitate to send me a message via MyChart or call the office at [phone]. Thank you for visiting with [place] today! It's our pleasure caring for you.   We will call you to get you set up with orthopedics

## 2022-11-03 NOTE — Progress Notes (Signed)
Subjective  CC:  Chief Complaint  Patient presents with   Hip Pain    HPI: Marilyn Myers is a 71 y.o. female who presents to the office today to address the problems listed above in the chief complaint. Patient reports she is here for "hip injection" She reports that during Thanksgiving, about 3 weeks ago she was in [place] and did a lot of walking.  She started having right low back pain.  Limping.  Now can barely walk due to pain.  She does better if she is sitting down.  Reports pain starts in the right upper buttocks and travels around the inguinal area.  She intermittently will have pain down the anterior portion of her right leg.  This is similar in nature to what she had back in July.  I reviewed that note.  She does have known degenerative joint disease changes in the lumbar spine from x-rays taken in April 2022.  She does have history of right hip bursitis.  She says this does feel different although it does feel tender when she lies on that side at night.  She denies weakness in the legs or bowel or bladder incontinence.  She has chronic peripheral neuropathy for which she takes high-dose gabapentin.  No injury.  No, referred her to sports medicine back in July but she never made an appointment.  She says she prefers to see [facility].  Assessment  1. Acute right-sided low back pain with right-sided sciatica   2. Osteoarthritis of spine with radiculopathy, lumbar region   3. Greater trochanteric bursitis of right hip   4. Diabetic peripheral neuropathy ([place])      Plan  Acute right-sided sciatica: I think most pain is stemming from her right lumbar spine.  Education given.  Could also have bursitis component as well as hip arthritis component.  Refer to orthopedics for further evaluation.  Prednisone taper to calm down sciatica pain at this point.  Tylenol as needed.  Recommend cane to avoid falls.  If has severe pain, bowel or bladder incontinence, to ER.  Patient understands  and agrees with care plan.  Instructed patient to monitor sugars while taking prednisone.  Increase fluid intake, water and decreased sweets and carbohydrates.  Follow up: As scheduled 12/23/2022  Orders Placed This Encounter  Procedures   Ambulatory referral to Orthopedic Surgery   Meds ordered this encounter  Medications   predniSONE (DELTASONE) 10 MG tablet    Sig: Take 4 tabs qd x 2 days, 3 qd x 2 days, 2 qd x 2d, 1qd x 3 days    Dispense:  21 tablet    Refill:  0      I reviewed the patients updated PMH, FH, and SocHx.    Patient Active Problem List   Diagnosis Date Noted   Secondary insomnia 06/02/2022   Combined hyperlipidemia associated with type 2 diabetes mellitus ([place]) 05/12/2022   Major depression, recurrent, chronic ([place])    History of suicide attempt - 2022 opiod overdose    Gastroesophageal reflux disease without esophagitis 01/07/2020   Obesity (BMI 30-39.9) 02/22/2019   Refusal of statin medication by patient 01/24/2018   Seasonal allergic rhinitis 01/24/2018   PVC's (premature ventricular contractions) 10/08/2016   Diabetic peripheral neuropathy ([place]) 09/28/2016   S/P colostomy takedown 07/02/2015   History of osteopenia 05/19/2015   Essential hypertension 01/28/2015   Diverticulitis of large intestine 08/19/2014   Hydradenitis 08/19/2014   Nephrolithiasis 08/19/2014   Obstructive sleep apnea 08/01/2014  Controlled type 2 diabetes mellitus with diabetic polyneuropathy, without long-term current use of insulin ([facility]) 06/10/2014   Current Meds  Medication Sig   BESIVANCE 0.6 % SUSP Apply 1 drop to eye 3 (three) times daily.   clotrimazole-betamethasone (LOTRISONE) cream Apply 1 application topically daily.   diltiazem (CARDIZEM CD) 120 MG 24 hr capsule TAKE ONE CAPSULE BY MOUTH DAILY   doxepin (SINEQUAN) 25 MG capsule Take 1 capsule (25 mg total) by mouth at bedtime.   DULoxetine (CYMBALTA) 60 MG capsule Take 1 capsule (60 mg total) by mouth daily.    DUREZOL 0.05 % EMUL Place 1 drop into the right eye 3 (three) times daily.   ezetimibe (ZETIA) 10 MG tablet Take 1 tablet (10 mg total) by mouth daily.   gabapentin (NEURONTIN) 800 MG tablet TAKE TWO TABLETS BY MOUTH THREE TIMES A DAY   losartan-hydrochlorothiazide (HYZAAR) 100-25 MG tablet Take 1 tablet by mouth daily.   metFORMIN (GLUCOPHAGE) 1000 MG tablet Take 1 tablet (1,000 mg total) by mouth 2 (two) times daily.   pantoprazole (PROTONIX) 20 MG tablet TAKE 1 TO 2 TABLETS (20-40 MG TOTAL) BY MOUTH DAILY.   potassium chloride SA (KLOR-CON M) 20 MEQ tablet Take 1 tablet (20 mEq total) by mouth daily.   predniSONE (DELTASONE) 10 MG tablet Take 4 tabs qd x 2 days, 3 qd x 2 days, 2 qd x 2d, 1qd x 3 days   sodium phosphate (FLEET) 7-19 GM/118ML ENEM Place 133 mLs (1 enema total) rectally daily as needed for severe constipation or moderate constipation.    Allergies: Patient is allergic to ace inhibitors, codeine, and flomax [tamsulosin hcl]. Family History: Patient family history includes Breast cancer in her mother; Diabetes in her maternal aunt and paternal grandmother; Heart disease in her mother; Renal Disease in her mother. Social History:  Patient  reports that she quit smoking about 26 years ago. Her smoking use included cigarettes. She has a 25.00 pack-year smoking history. She has never used smokeless tobacco. She reports that she does not drink alcohol and does not use drugs.  Review of Systems: Constitutional: Negative for fever malaise or anorexia Cardiovascular: negative for chest pain Respiratory: negative for SOB or persistent cough Gastrointestinal: negative for abdominal pain  Objective  Vitals: BP 124/80   Pulse 96   Temp 98 F (36.7 C)   Ht '5\' 6"'$  (1.676 m)   Wt 192 lb 6.4 oz (87.3 kg)   SpO2 96%   BMI 31.05 kg/m  General: Appears uncomfortable, antalgic gait with limp, A&Ox3 HEENT: PEERL, conjunctiva normal, neck is supple Cardiovascular:  RRR without murmur or  gallop.  Respiratory:  Good breath sounds bilaterally, CTAB with normal respiratory effort Right low back: Tender right paralumbar muscles and right sciatic notch, tender right greater trochanteric bursa, antalgic gait    Commons side effects, risks, benefits, and alternatives for medications and treatment plan prescribed today were discussed, and the patient expressed understanding of the given instructions. Patient is instructed to call or message via MyChart if he/she has any questions or concerns regarding our treatment plan. No barriers to understanding were identified. We discussed Red Flag symptoms and signs in detail. Patient expressed understanding regarding what to do in case of urgent or emergency type symptoms.  Medication list was reconciled, printed and provided to the patient in AVS. Patient instructions and summary information was reviewed with the patient as documented in the AVS. This note was prepared with assistance of Dragon voice recognition software. Occasional wrong-word or  sound-a-like substitutions may have occurred due to the inherent limitations of voice recognition software  This visit occurred during the SARS-CoV-2 public health emergency.  Safety protocols were in place, including screening questions prior to the visit, additional usage of staff PPE, and extensive cleaning of exam room while observing appropriate contact time as indicated for disinfecting solutions.

## 2022-12-01 DIAGNOSIS — M25551 Pain in right hip: Secondary | ICD-10-CM | POA: Diagnosis not present

## 2022-12-01 DIAGNOSIS — M1611 Unilateral primary osteoarthritis, right hip: Secondary | ICD-10-CM | POA: Diagnosis not present

## 2022-12-27 DIAGNOSIS — M25551 Pain in right hip: Secondary | ICD-10-CM | POA: Diagnosis not present

## 2023-01-03 ENCOUNTER — Telehealth: Payer: Self-pay | Admitting: Family Medicine

## 2023-01-03 DIAGNOSIS — M1611 Unilateral primary osteoarthritis, right hip: Secondary | ICD-10-CM | POA: Diagnosis not present

## 2023-01-03 NOTE — Telephone Encounter (Signed)
..  Type of form received: clearance form   Additional comments:   Received [id number] ortho  Form should be Faxed to: [phone]  Form should be mailed to:   na Is patient requesting call for pickup:  Na  Form placed:   Dr Toy Baker folder  Attach charge sheet.  Yes  Individual made aware of 3-5 business day turn around (Y/N)?  YES

## 2023-01-04 NOTE — Telephone Encounter (Signed)
Forms placed in Andy's box.  Forms has been faxed on 01/05/2023

## 2023-01-05 ENCOUNTER — Ambulatory Visit (INDEPENDENT_AMBULATORY_CARE_PROVIDER_SITE_OTHER): Payer: Medicare Other

## 2023-01-05 VITALS — Wt 192.0 lb

## 2023-01-05 DIAGNOSIS — Z0000 Encounter for general adult medical examination without abnormal findings: Secondary | ICD-10-CM

## 2023-01-05 NOTE — Progress Notes (Signed)
I connected with  Daleen Bo on 01/05/23 by a audio enabled telemedicine application and verified that I am speaking with the correct person using two identifiers.  Patient Location: Home  Provider Location: Home Office  I discussed the limitations of evaluation and management by telemedicine. The patient expressed understanding and agreed to proceed.   Subjective:   Marilyn Myers is a 72 y.o. female who presents for an Initial Medicare Annual Wellness Visit.  Review of Systems     Cardiac Risk Factors include: advanced age (>29mn, >>37women);diabetes mellitus;hypertension;dyslipidemia;obesity (BMI >30kg/m2)     Objective:    Today's Vitals   01/05/23 1301  Weight: 192 lb (87.1 kg)   Body mass index is 30.99 kg/m.     01/05/2023    1:08 PM 03/11/2021    1:29 PM 02/17/2021    9:19 AM 01/25/2021    4:58 PM 01/22/2021    9:02 AM 12/11/2020    3:28 PM 12/10/2019    3:15 PM  Advanced Directives  Does Patient Have a Medical Advance Directive? Yes No No;Yes Yes Yes Yes Yes  Type of AParamedicof AMcCameyLiving will  [place]Living will HHome[place]Living will [place]andal[place]Living will H[place]Living will Living will;Healthcare Power of Attorney  Does patient want to make changes to medical advance directive? No - Patient declined      No - Patient declined  Copy of HFondain Chart? Yes - validated most recent copy scanned in chart (See row information)     No - copy requested No - copy requested  Would patient like information on creating a medical advance directive?  No - Patient declined         Current Medications (verified) Outpatient Encounter Medications as of 01/05/2023  Medication Sig   clotrimazole-betamethasone (LOTRISONE) cream Apply 1 application topically daily.   diltiazem (CARDIZEM CD) 120 MG 24 hr capsule TAKE ONE CAPSULE BY MOUTH DAILY    doxepin (SINEQUAN) 25 MG capsule Take 1 capsule (25 mg total) by mouth at bedtime.   DULoxetine (CYMBALTA) 60 MG capsule Take 1 capsule (60 mg total) by mouth daily.   ezetimibe (ZETIA) 10 MG tablet Take 1 tablet (10 mg total) by mouth daily.   gabapentin (NEURONTIN) 800 MG tablet TAKE TWO TABLETS BY MOUTH THREE TIMES A DAY   losartan-hydrochlorothiazide (HYZAAR) 100-25 MG tablet Take 1 tablet by mouth daily.   metFORMIN (GLUCOPHAGE) 1000 MG tablet Take 1 tablet (1,000 mg total) by mouth 2 (two) times daily.   pantoprazole (PROTONIX) 20 MG tablet TAKE 1 TO 2 TABLETS (20-40 MG TOTAL) BY MOUTH DAILY.   potassium chloride SA (KLOR-CON M) 20 MEQ tablet Take 1 tablet (20 mEq total) by mouth daily.   sodium phosphate (FLEET) 7-19 GM/118ML ENEM Place 133 mLs (1 enema total) rectally daily as needed for severe constipation or moderate constipation.   [DISCONTINUED] BESIVANCE 0.6 % SUSP Apply 1 drop to eye 3 (three) times daily.   [DISCONTINUED] DUREZOL 0.05 % EMUL Place 1 drop into the right eye 3 (three) times daily.   [DISCONTINUED] predniSONE (DELTASONE) 10 MG tablet Take 4 tabs qd x 2 days, 3 qd x 2 days, 2 qd x 2d, 1qd x 3 days   No facility-administered encounter medications on file as of 01/05/2023.    Allergies (verified) Ace inhibitors, Codeine, and Flomax [tamsulosin hcl]   History: Past Medical History:  Diagnosis Date  Anxiety    Diverticulitis of intestine without perforation or abscess    Diverticulosis, sigmoid    GERD (gastroesophageal reflux disease)    History of diverticulitis of colon    09/ 2015  RESOLVED    History of kidney stones    Hypertension    Nephrolithiasis    BILATERAL   OSA on CPAP    no longer uses    Right ureteral stone    Seasonal allergic rhinitis 01/24/2018   Type 2 diabetes mellitus ([place])    type 2    Past Surgical History:  Procedure Laterality Date   BARIATRIC SURGERY     CERVICAL CONE BIOPSY  1976   COLON RESECTION     colostomy x 6 months    CYSTO/  BILATERAL RETROGRADE PYELOGRAM/  PLACEMENT BILATERAL URETERAL STENTS  07-10-2011   CYSTOSCOPY WITH RETROGRADE PYELOGRAM, URETEROSCOPY AND STENT PLACEMENT Right 10/11/2014   Procedure: CYSTOSCOPY WITH RETROGRADE PYELOGRAM, URETEROSCOPY, right ureteral biopsy, AND STENT PLACEMENT;  Surgeon: Arvil Persons, MD;  Location: [facility];  Service: Urology;  Laterality: Right;   CYSTOSCOPY/RETROGRADE/URETEROSCOPY  10/08/2011   Procedure: CYSTOSCOPY/RETROGRADE/URETEROSCOPY;  Surgeon: Hanley Ben, MD;  Location: [facility];  Service: Urology;  Laterality: N/A;  CYSTOSCOPY, RIGHT  RETROGRADE, RIGHT URETEROSCOPY HOLMIUM LASER WITH BASKET STONE EXTRACTION AND RIGHT URETERAL STENT PLACEMENT   CYSTOSCOPY/URETEROSCOPY/HOLMIUM LASER/STENT PLACEMENT Right 02/20/2021   Procedure: CYSTOSCOPY RETROGRADE/URETEROSCOPY/HOLMIUM LASER/STENT PLACEMENT/BASKET STONE EXTRACTION;  Surgeon: Janith Lima, MD;  Location: [facility];  Service: Urology;  Laterality: Right;  ONLY NEEDS 30 MIN   EXTRACORPOREAL SHOCK WAVE LITHOTRIPSY  x3  last one 01-24-2012   TUBAL LIGATION  1987   URETEROSCPIC STONE EXTRACTION/ STENT PLACEMENT  left 08-20-2011   Family History  Problem Relation Age of Onset   Breast cancer Mother        12   Renal Disease Mother    Heart disease Mother    Diabetes Paternal Grandmother    Diabetes Maternal Aunt    Social History   Socioeconomic History   Marital status: Widowed    Spouse name: Not on file   Number of children: 3   Years of education: Not on file   Highest education level: Not on file  Occupational History   Occupation: Retired   Tobacco Use   Smoking status: Former    Packs/day: 1.00    Years: 25.00    Total pack years: 25.00    Types: Cigarettes    Quit date: 10/07/1996    Years since quitting: 26.2   Smokeless tobacco: Never  Vaping Use   Vaping Use: Never used  Substance and Sexual Activity   Alcohol use: Never   Drug use: No   Sexual  activity: Never  Other Topics Concern   Not on file  Social History Narrative   In process of selling home and moving to [place] with daughter   Right handed   Caffeine 1/2 cup    Social Determinants of Health   Financial Resource Strain: Low Risk  (01/05/2023)   Overall Financial Resource Strain (CARDIA)    Difficulty of Paying Living Expenses: Not hard at all  Food Insecurity: No Food Insecurity (01/05/2023)   Hunger Vital Sign    Worried About Running Out of Food in the Last Year: Never true    [place] in the Last Year: Never true  Transportation Needs: No Transportation Needs (01/05/2023)   PRAPARE - Transportation    Lack of Transportation (  Medical): No    Lack of Transportation (Non-Medical): No  Physical Activity: Inactive (01/05/2023)   Exercise Vital Sign    Days of Exercise per Week: 0 days    Minutes of Exercise per Session: 0 min  Stress: No Stress Concern Present (01/05/2023)   [place]    Feeling of Stress : Not at all  Social Connections: Moderately Integrated (01/05/2023)   Social Connection and Isolation Panel [NHANES]    Frequency of Communication with Friends and Family: More than three times a week    Frequency of Social Gatherings with Friends and Family: More than three times a week    Attends Religious Services: More than 4 times per year    Active Member of [employer] or Organizations: Yes    Attends Archivist Meetings: 1 to 4 times per year    Marital Status: Widowed    Tobacco Counseling Counseling given: Not Answered   Clinical Intake:  Pre-visit preparation completed: Yes  Pain : No/denies pain     BMI - recorded: 30.99 Nutritional Status: BMI > 30  Obese Nutritional Risks: None Diabetes: Yes CBG done?: No Did pt. bring in CBG monitor from home?: No  How often do you need to have someone help you when you read instructions, pamphlets, or other written  materials from your doctor or pharmacy?: 1 - Never  Diabetic?Nutrition Risk Assessment:  Has the patient had any N/V/D within the last 2 months?  No  Does the patient have any non-healing wounds?  No  Has the patient had any unintentional weight loss or weight gain?  No   Diabetes:  Is the patient diabetic?  Yes  If diabetic, was a CBG obtained today?  No  Did the patient bring in their glucometer from home?  No  How often do you monitor your CBG's? N/a.   Financial Strains and Diabetes Management:  Are you having any financial strains with the device, your supplies or your medication? No .  Does the patient want to be seen by Chronic Care Management for management of their diabetes?  No  Would the patient like to be referred to a Nutritionist or for Diabetic Management?  No   Diabetic Exams:  Diabetic Eye Exam: Overdue for diabetic eye exam. Pt has been advised about the importance in completing this exam. Patient advised to call and schedule an eye exam. Diabetic Foot Exam: Completed 05/12/22   Interpreter Needed?: No  Information entered by :: Charlott Rakes, LPN   Activities of Daily Living    01/05/2023    1:09 PM  In your present state of health, do you have any difficulty performing the following activities:  Hearing? 0  Vision? 0  Difficulty concentrating or making decisions? 0  Walking or climbing stairs? 1  Comment with hip take a chair lift  Dressing or bathing? 0  Doing errands, shopping? 0  Preparing Food and eating ? N  Using the Toilet? N  In the past six months, have you accidently leaked urine? N  Do you have problems with loss of bowel control? N  Managing your Medications? N  Managing your Finances? N  Housekeeping or managing your Housekeeping? N    Patient Care Team: [name], MD as PCP - General (Family Medicine) Dasher, Jeneen Rinks, MD (Surgery) Princess Bruins, MD as Consulting Physician (Obstetrics and Gynecology) Idolina Primer Warnell Bureau  [facility]) Harriett Sine, MD as Consulting Physician (Dermatology) Roche, Christian Mate, PA-C  as Physician Assistant (Pain Medicine) Edythe Clarity, [facility] (Pharmacist) Janith Lima, MD as Consulting Physician (Urology)  Indicate any recent Medical Services you may have received from other than [facility] providers in the past year (date may be approximate).     Assessment:   This is a routine wellness examination for [place].  Hearing/Vision screen Hearing Screening - Comments:: Pt denies any hearing issues  Vision Screening - Comments:: Pt follows up with Dr Idolina Primer for annual eye exams   Dietary issues and exercise activities discussed: Current Exercise Habits: The patient does not participate in regular exercise at present   Goals Addressed             This Visit's Progress    Patient Stated       get through this operation       Depression Screen    01/05/2023    1:06 PM 11/03/2022    2:28 PM 08/16/2022    9:43 AM 06/02/2022    9:32 AM 05/12/2022    9:22 AM 02/05/2022   11:29 AM 12/17/2021    3:16 PM  PHQ 2/9 Scores  PHQ - 2 Score 0 0 0 0 0 0 0  PHQ- 9 Score      5     Fall Risk    01/05/2023    1:08 PM 11/03/2022    2:28 PM 08/16/2022    9:43 AM 06/02/2022    9:32 AM 05/12/2022    9:21 AM  [place] in the past year? 0 0 0 0 0  Number falls in past yr: 0 0 0 0 0  Injury with Fall? 0 0 0 0 0  Risk for fall due to : Impaired balance/gait;Impaired vision No Fall Risks No Fall Risks No Fall Risks No Fall Risks  Follow up Falls prevention discussed Falls evaluation completed Falls evaluation completed Falls evaluation completed Falls evaluation completed    [place]:  Any stairs in or around the home? Yes  If so, are there any without handrails? No  Home free of loose throw rugs in walkways, pet beds, electrical cords, etc? Yes  Adequate lighting in your home to reduce risk of falls? Yes   ASSISTIVE DEVICES UTILIZED TO  PREVENT FALLS:  Life alert? No  Use of a cane, walker or w/c? No  Grab bars in the bathroom? Yes  Shower chair or bench in shower? No  Elevated toilet seat or a handicapped toilet? No   TIMED UP AND GO:  Was the test performed? No .  Cognitive Function:    12/07/2018   10:23 AM 12/01/2017    1:31 PM  MMSE - Mini Mental State Exam  Orientation to time 5 5  Orientation to Place 5 5  Registration 3 3  Attention/ Calculation 5 5  Recall 3 3  Language- name 2 objects 2 2  Language- repeat 1 1  Language- follow 3 step command 3 3  Language- read & follow direction 1 1  Write a sentence 1 1  Copy design 1 1  Total score 30 30        01/05/2023    1:10 PM 12/17/2021    3:20 PM 12/11/2020    3:32 PM 12/10/2019    3:31 PM  6CIT Screen  What Year? 0 points 0 points 0 points 0 points  What month? 0 points 0 points 0 points 0 points  What time? 0 points 0 points  0 points  Count back from 20 0 points 0 points 0 points 0 points  Months in reverse 0 points 0 points 0 points 0 points  Repeat phrase 0 points 0 points 0 points 0 points  Total Score 0 points 0 points  0 points    Immunizations Immunization History  Administered Date(s) Administered   Fluad Quad(high Dose 65+) 08/27/2019, 09/22/2021, 08/16/2022   Influenza, High Dose Seasonal PF 08/19/2014, 09/02/2015, 10/08/2016, 09/22/2017, 08/29/2018   Influenza-Unspecified 10/13/2020   PFIZER(Purple Top)SARS-COV-2 Vaccination 12/29/2019, 01/18/2020, 10/13/2020   Pneumococcal Conjugate-13 10/18/2016   Pneumococcal Polysaccharide-23 08/19/2014      Flu Vaccine status: Up to date  Pneumococcal vaccine status: Up to date  Covid-19 vaccine status: Completed vaccines  Qualifies for Shingles Vaccine? Yes   Zostavax completed No   Shingrix Completed?: No.    Education has been provided regarding the importance of this vaccine. Patient has been advised to call insurance company to determine out of pocket expense if they have not  yet received this vaccine. Advised may also receive vaccine at local pharmacy or Health Dept. Verbalized acceptance and understanding.  Screening Tests Health Maintenance  Topic Date Due   COVID-19 Vaccine (4 - 2023-24 season) 07/23/2022   OPHTHALMOLOGY EXAM  11/02/2022   MAMMOGRAM  01/24/2023   Diabetic kidney evaluation - Urine ACR  02/06/2023   HEMOGLOBIN A1C  02/14/2023   FOOT EXAM  05/13/2023   DEXA SCAN  07/05/2023   Diabetic kidney evaluation - eGFR measurement  08/17/2023   Medicare Annual Wellness (AWV)  01/06/2024   COLONOSCOPY (Pts 45-74yr Insurance coverage will need to be confirmed)  06/28/2028   Pneumonia Vaccine 74 Years old  Completed   INFLUENZA VACCINE  Completed   HPV VACCINES  Aged Out   DTaP/Tdap/Td  Discontinued   Hepatitis C Screening  Discontinued   Zoster Vaccines- Shingrix  Discontinued    Health Maintenance  Health Maintenance Due  Topic Date Due   COVID-19 Vaccine (4 - 2023-24 season) 07/23/2022   OPHTHALMOLOGY EXAM  11/02/2022    Colorectal cancer screening: Type of screening: Colonoscopy. Completed 8/7/. Repeat every 06/28/18 years  Mammogram status: Completed 01/23/22. Repeat every year  Bone Density status: Completed 07/04/18. Results reflect: Bone density results: NORMAL. Repeat every 5 years.  Additional Screening:   Vision Screening: Recommended annual ophthalmology exams for early detection of glaucoma and other disorders of the eye. Is the patient up to date with their annual eye exam?  no Who is the provider or what is the name of the office in which the patient attends annual eye exams? Dr DIdolina PrimerIf pt is not established with a provider, would they like to be referred to a provider to establish care? No .   Dental Screening: Recommended annual dental exams for proper oral hygiene  Community Resource Referral / Chronic Care Management: CRR required this visit?  No   CCM required this visit?  No      Plan:     I have personally  reviewed and noted the following in the patient's chart:   Medical and social history Use of alcohol, tobacco or illicit drugs  Current medications and supplements including opioid prescriptions. Patient is not currently taking opioid prescriptions. Functional ability and status Nutritional status Physical activity Advanced directives List of other physicians Hospitalizations, surgeries, and ER visits in previous 12 months Vitals Screenings to include cognitive, depression, and falls Referrals and appointments  In addition, I have reviewed and discussed with patient certain preventive  protocols, quality metrics, and best practice recommendations. A written personalized care plan for preventive services as well as general preventive health recommendations were provided to patient.     Willette Brace, LPN   [phone]   Nurse Notes: none

## 2023-01-05 NOTE — Patient Instructions (Signed)
Marilyn Myers , Thank you for taking time to come for your Medicare Wellness Visit. I appreciate your ongoing commitment to your health goals. Please review the following plan we discussed and let me know if I can assist you in the future.   These are the goals we discussed:  Goals      Lifestyle Change-Hypertension     Timeframe:  Long-Range Goal Priority:  High Start Date: 01/07/21                       Expected End Date: 01/07/22                       Follow Up Date 04/21/2021  - agree to work together to make changes - ask questions to understand - learn about high blood pressure    Why is this important?   The changes that you are asked to make may be hard to do.  This is especially true when the changes are life-long.  Knowing why it is important to you is the first step.  Working on the change with your family or support person helps you not feel alone.  Reward yourself and family or support person when goals are met. This can be an activity you choose like bowling, hiking, biking, swimming or shooting hoops.     Notes:      Patient Stated     Follow diet and organize/clean house     Patient Stated     Not at this time     Patient Stated     Walk more      Patient Stated     get through this operation     [place] (see longitudinal plan of care for additional care plan information)  Current Barriers:  Chronic Disease Management support, education, and care coordination needs related to GERD, Hypertension, Hyperlipidemia, and Diabetes   Hypertension BP Readings from Last 3 Encounters:  06/27/20 130/68  06/11/20 126/70  04/30/20 112/70  Pharmacist Clinical Goal(s): Over the next 180 days, patient will work with PharmD and providers to maintain BP goal <130/80 Current regimen:  Losartan 100 mg once daily Diltiazem 120 mg 120 mg once daily Hydrochlorothiazide 25 mg once daily Interventions: Continue current management Patient self  care activities - Over the next 180 days, patient will: Check BP at least once every 1-2 weeks, document, and provide at future appointments Ensure daily salt intake < 2300 mg/day  Hyperlipidemia Lab Results  Component Value Date/Time   LDLCALC 87 11/06/2018 12:00 AM   LDLDIRECT 115.0 08/27/2019 09:27 AM  Pharmacist Clinical Goal(s): Over the next 180 days, patient will work with PharmD and providers to achieve LDL goal < 100 Current regimen:  Continue current management Interventions: Diet counseling Patient self care activities - Over the next 180 days, patient will: Continue current management  Diabetes  Lab Results  Component Value Date/Time   HGBA1C 6.7 (A) 04/30/2020 04:44 PM   HGBA1C 6.7 (A) 01/07/2020 09:48 AM   HGBA1C 6.4 11/06/2018 12:00 AM  Pharmacist Clinical Goal(s): Over the next 180 days, patient will work with PharmD and providers to maintain A1c goal <7% Current regimen:  Metformin 1000 mg twice daily  Interventions: Continue current management Patient self care activities - Over the next 180 days, patient will: Check blood sugar as directed, document, and provide at future appointments Contact provider with any episodes of hypoglycemia  GERD Pharmacist Clinical Goal(s) Over the next 180 days, patient will work with PharmD and providers to minimize acid reflux symptoms Current regimen:  Pantoprazole 20-40 mg daily Interventions: Discussed acid reflux food triggers Patient self care activities - Over the next 180 days, patient will: Continue current management with emphasis on food trigger avoidance  Medication management Pharmacist Clinical Goal(s): Over the next 180 days, patient will work with PharmD and providers to maintain optimal medication adherence Current pharmacy: [facility] Interventions Comprehensive medication review performed. Continue current medication management strategy Patient self care activities - Over the next 180 days, patient  will: Take medications as prescribed Report any questions or concerns to PharmD and/or provider(s)  Initial goal documentation.     Set My Target A1C-Diabetes Type 2     Timeframe:  Short-Term Goal Priority:  High Start Date:       01/07/21 Expected End Date: 01/07/21 Follow Up Date 04/06/2021  - set target A1C - goal a1c <7%   Why is this important?   Your target A1C is decided together by you and your doctor.  It is based on several things like your age and other health issues.    Notes:      Weight (lb) < 200 lb (90.7 kg)     Begin "bariatric diet" again.         This is a list of the screening recommended for you and due dates:  Health Maintenance  Topic Date Due   COVID-19 Vaccine (4 - 2023-24 season) 07/23/2022   Eye exam for diabetics  11/02/2022   Mammogram  01/24/2023   Yearly kidney health urinalysis for diabetes  02/06/2023   Hemoglobin A1C  02/14/2023   Complete foot exam   05/13/2023   DEXA scan (bone density measurement)  07/05/2023   Yearly kidney function blood test for diabetes  08/17/2023   Medicare Annual Wellness Visit  01/06/2024   Colon Cancer Screening  06/28/2028   Pneumonia Vaccine  Completed   Flu Shot  Completed   HPV Vaccine  Aged Out   DTaP/Tdap/Td vaccine  Discontinued   Hepatitis C Screening: USPSTF Recommendation to screen - Ages 38-79 yo.  Discontinued   Zoster (Shingles) Vaccine  Discontinued    Advanced directives: copies in chart   Conditions/risks identified: get better after this hip operation   Next appointment: Follow up in one year for your annual wellness visit    Preventive Care 65 Years and Older, Female Preventive care refers to lifestyle choices and visits with your health care provider that can promote health and wellness. What does preventive care include? A yearly physical exam. This is also called an annual well check. Dental exams once or twice a year. Routine eye exams. Ask your health care provider how  often you should have your eyes checked. Personal lifestyle choices, including: Daily care of your teeth and gums. Regular physical activity. Eating a healthy diet. Avoiding tobacco and drug use. Limiting alcohol use. Practicing safe sex. Taking low-dose aspirin every day. Taking vitamin and mineral supplements as recommended by your health care provider. What happens during an annual well check? The services and screenings done by your health care provider during your annual well check will depend on your age, overall health, lifestyle risk factors, and family history of disease. Counseling  Your health care provider may ask you questions about your: Alcohol use. Tobacco use. Drug use. Emotional well-being. Home and relationship well-being. Sexual activity. Eating habits. History of falls. Memory and ability  to understand (cognition). Work and work Statistician. Reproductive health. Screening  You may have the following tests or measurements: Height, weight, and BMI. Blood pressure. Lipid and cholesterol levels. These may be checked every 5 years, or more frequently if you are over 41 years old. Skin check. Lung cancer screening. You may have this screening every year starting at age 31 if you have a 30-pack-year history of smoking and currently smoke or have quit within the past 15 years. Fecal occult blood test (FOBT) of the stool. You may have this test every year starting at age 69. Flexible sigmoidoscopy or colonoscopy. You may have a sigmoidoscopy every 5 years or a colonoscopy every 10 years starting at age 59. Hepatitis C blood test. Hepatitis B blood test. Sexually transmitted disease (STD) testing. Diabetes screening. This is done by checking your blood sugar (glucose) after you have not eaten for a while (fasting). You may have this done every 1-3 years. Bone density scan. This is done to screen for osteoporosis. You may have this done starting at age 39. Mammogram.  This may be done every 1-2 years. Talk to your health care provider about how often you should have regular mammograms. Talk with your health care provider about your test results, treatment options, and if necessary, the need for more tests. Vaccines  Your health care provider may recommend certain vaccines, such as: Influenza vaccine. This is recommended every year. Tetanus, diphtheria, and acellular pertussis (Tdap, Td) vaccine. You may need a Td booster every 10 years. Zoster vaccine. You may need this after age 10. Pneumococcal 13-valent conjugate (PCV13) vaccine. One dose is recommended after age 92. Pneumococcal polysaccharide (PPSV23) vaccine. One dose is recommended after age 28. Talk to your health care provider about which screenings and vaccines you need and how often you need them. This information is not intended to replace advice given to you by your health care provider. Make sure you discuss any questions you have with your health care provider. Document Released: 12/05/2015 Document Revised: 07/28/2016 Document Reviewed: 09/09/2015 Elsevier Interactive Patient Education  2017 [employer] Prevention in the Home Falls can cause injuries. They can happen to people of all ages. There are many things you can do to make your home safe and to help prevent falls. What can I do on the outside of my home? Regularly fix the edges of walkways and driveways and fix any cracks. Remove anything that might make you trip as you walk through a door, such as a raised step or threshold. Trim any bushes or trees on the path to your home. Use bright outdoor lighting. Clear any walking paths of anything that might make someone trip, such as rocks or tools. Regularly check to see if handrails are loose or broken. Make sure that both sides of any steps have handrails. Any raised decks and porches should have guardrails on the edges. Have any leaves, snow, or ice cleared regularly. Use sand  or salt on walking paths during winter. Clean up any spills in your garage right away. This includes oil or grease spills. What can I do in the bathroom? Use night lights. Install grab bars by the toilet and in the tub and shower. Do not use towel bars as grab bars. Use non-skid mats or decals in the tub or shower. If you need to sit down in the shower, use a plastic, non-slip stool. Keep the floor dry. Clean up any water that spills on the floor as soon as  it happens. Remove soap buildup in the tub or shower regularly. Attach bath mats securely with double-sided non-slip rug tape. Do not have throw rugs and other things on the floor that can make you trip. What can I do in the bedroom? Use night lights. Make sure that you have a light by your bed that is easy to reach. Do not use any sheets or blankets that are too big for your bed. They should not hang down onto the floor. Have a firm chair that has side arms. You can use this for support while you get dressed. Do not have throw rugs and other things on the floor that can make you trip. What can I do in the kitchen? Clean up any spills right away. Avoid walking on wet floors. Keep items that you use a lot in easy-to-reach places. If you need to reach something above you, use a strong step stool that has a grab bar. Keep electrical cords out of the way. Do not use floor polish or wax that makes floors slippery. If you must use wax, use non-skid floor wax. Do not have throw rugs and other things on the floor that can make you trip. What can I do with my stairs? Do not leave any items on the stairs. Make sure that there are handrails on both sides of the stairs and use them. Fix handrails that are broken or loose. Make sure that handrails are as long as the stairways. Check any carpeting to make sure that it is firmly attached to the stairs. Fix any carpet that is loose or worn. Avoid having throw rugs at the top or bottom of the stairs.  If you do have throw rugs, attach them to the floor with carpet tape. Make sure that you have a light switch at the top of the stairs and the bottom of the stairs. If you do not have them, ask someone to add them for you. What else can I do to help prevent falls? Wear shoes that: Do not have high heels. Have rubber bottoms. Are comfortable and fit you well. Are closed at the toe. Do not wear sandals. If you use a stepladder: Make sure that it is fully opened. Do not climb a closed stepladder. Make sure that both sides of the stepladder are locked into place. Ask someone to hold it for you, if possible. Clearly mark and make sure that you can see: Any grab bars or handrails. First and last steps. Where the edge of each step is. Use tools that help you move around (mobility aids) if they are needed. These include: Canes. Walkers. Scooters. Crutches. Turn on the lights when you go into a dark area. Replace any light bulbs as soon as they burn out. Set up your furniture so you have a clear path. Avoid moving your furniture around. If any of your floors are uneven, fix them. If there are any pets around you, be aware of where they are. Review your medicines with your doctor. Some medicines can make you feel dizzy. This can increase your chance of falling. Ask your doctor what other things that you can do to help prevent falls. This information is not intended to replace advice given to you by your health care provider. Make sure you discuss any questions you have with your health care provider. Document Released: 09/04/2009 Document Revised: 04/15/2016 Document Reviewed: 12/13/2014 Elsevier Interactive Patient Education  2017 [employer].

## 2023-01-26 ENCOUNTER — Telehealth: Payer: Self-pay | Admitting: Pharmacist

## 2023-01-26 NOTE — Progress Notes (Signed)
Care Management & Coordination Services Pharmacy Team  Reason for Encounter: Appointment Reminder  Contacted patient to confirm telephone appointment with Leata Mouse, PharmD on 01/27/2023 at 2 pm. Spoke with patient on 01/26/2023     Star Rating Drugs:  Losartan-HCTZ 100-25 mg last filled 11/25/2022 90 DS Metformin 1000 mg last filled 11/17/2022 90 DS   Care Gaps: Annual wellness visit in last year? Yes  If Diabetic: Last eye exam / retinopathy screening: 11/02/2021 Last diabetic foot exam: 05/12/2022  Future Appointments  Date Time Provider [place]  01/27/2023  2:00 PM Kairi, Archilla, [facility] None  02/14/2023  3:00 PM [name], MD [facility]   April D Calhoun, [place] Pharmacist Assistant [phone]

## 2023-01-27 ENCOUNTER — Encounter: Payer: Medicare Other | Admitting: Pharmacist

## 2023-01-27 NOTE — Progress Notes (Unsigned)
Care Management & Coordination Services Pharmacy Note  01/27/2023 Name:  Marilyn Myers MRN:  [id number] DOB:  04-12-1951  Summary: ***  Recommendations/Changes made from today's visit: ***  Follow up plan: ***   Subjective: Marilyn Myers is an 72 y.o. year old female who is a primary patient of [name], MD.  The care coordination team was consulted for assistance with disease management and care coordination needs.    {CCMTELEPHONEFACETOFACE:21091510} for {CCMINITIALFOLLOWUPCHOICE:21091511}.  Recent office visits: ***  Recent consult visits: ***  Hospital visits: {Hospital DC Yes/No:25215}   Objective:  Lab Results  Component Value Date   CREATININE 0.99 08/16/2022   BUN 24 (H) 08/16/2022   GFR 56.65 (L) 08/16/2022   GFRNONAA 60 (L) 03/11/2021   GFRAA 77 (L) 07/26/2014   NA 141 08/16/2022   K 4.3 08/16/2022   CALCIUM 9.9 08/16/2022   CO2 31 08/16/2022   GLUCOSE 134 (H) 08/16/2022    Lab Results  Component Value Date/Time   HGBA1C 6.5 (A) 08/16/2022 09:50 AM   HGBA1C 6.3 (A) 05/12/2022 09:27 AM   HGBA1C 7.5 (H) 02/05/2022 11:25 AM   HGBA1C 7.3 (H) 07/01/2021 10:15 AM   HGBA1C 6.4 11/06/2018 12:00 AM   GFR 56.65 (L) 08/16/2022 10:08 AM   GFR 58.17 (L) 05/12/2022 09:47 AM   MICROALBUR 7.5 (H) 02/05/2022 11:25 AM    Last diabetic Eye exam:  Lab Results  Component Value Date/Time   HMDIABEYEEXA No Retinopathy 11/02/2021 12:00 AM    Last diabetic Foot exam: No results found for: "HMDIABFOOTEX"   Lab Results  Component Value Date   CHOL 148 05/12/2022   HDL 44.60 05/12/2022   LDLCALC 70 05/12/2022   LDLDIRECT 144.0 02/05/2022   TRIG 166.0 (H) 05/12/2022   CHOLHDL 3 05/12/2022       Latest Ref Rng & Units 05/12/2022    9:47 AM 02/05/2022   11:25 AM 09/22/2021    3:16 PM  Hepatic Function  Total Protein 6.0 - 8.3 g/dL 7.1  7.6  7.0   Albumin 3.5 - 5.2 g/dL 3.8  4.1    AST 0 - 37 U/L 17  14    ALT 0 - 35 U/L 13  10    Alk Phosphatase  39 - 117 U/L 85  91    Total Bilirubin 0.2 - 1.2 mg/dL 0.8  0.8      Lab Results  Component Value Date/Time   TSH 1.51 02/05/2022 11:25 AM   TSH 1.38 07/01/2021 10:15 AM       Latest Ref Rng & Units 02/05/2022   11:25 AM 07/01/2021   10:15 AM 03/11/2021    4:45 PM  CBC  WBC 4.0 - 10.5 K/uL 9.7  7.4  12.3   Hemoglobin 12.0 - 15.0 g/dL 13.1  12.2  11.8   Hematocrit 36.0 - 46.0 % 39.8  37.5  36.5   Platelets 150.0 - 400.0 K/uL 317.0  282.0  303     Lab Results  Component Value Date/Time   VD25OH 51.36 07/01/2021 10:15 AM   VD25OH 43.9 11/06/2018 12:00 AM   VITAMINB12 506 07/01/2021 10:15 AM    Clinical ASCVD: {YES/NO:21197} The 10-year ASCVD risk score (Arnett DK, et al., 2019) is: 28%   Values used to calculate the score:     Age: 70 years     Sex: Female     Is Non-Hispanic African American: No     Diabetic: Yes     Tobacco smoker: No  Systolic Blood Pressure: A999333 mmHg     Is BP treated: Yes     HDL Cholesterol: 44.6 mg/dL     Total Cholesterol: 148 mg/dL    ***Other: (CHADS2VASc if Afib, MMRC or CAT for COPD, ACT, DEXA)     01/05/2023    1:06 PM 11/03/2022    2:28 PM 08/16/2022    9:43 AM  Depression screen PHQ 2/9  Decreased Interest 0 0 0  Down, Depressed, Hopeless 0 0 0  PHQ - 2 Score 0 0 0     Social History   Tobacco Use  Smoking Status Former   Packs/day: 1.00   Years: 25.00   Total pack years: 25.00   Types: Cigarettes   Quit date: 10/07/1996   Years since quitting: 26.3  Smokeless Tobacco Never   BP Readings from Last 3 Encounters:  11/03/22 124/80  08/16/22 100/70  07/05/22 139/88   Pulse Readings from Last 3 Encounters:  11/03/22 96  08/16/22 97  07/05/22 88   Wt Readings from Last 3 Encounters:  01/05/23 192 lb (87.1 kg)  11/03/22 192 lb 6.4 oz (87.3 kg)  08/16/22 185 lb 9.6 oz (84.2 kg)   BMI Readings from Last 3 Encounters:  01/05/23 30.99 kg/m  11/03/22 31.05 kg/m  08/16/22 29.96 kg/m    Allergies  Allergen  Reactions   Ace Inhibitors Cough   Codeine Nausea And Vomiting   Flomax [Tamsulosin Hcl] Itching    Medications Reviewed Today     Reviewed by Willette Brace, LPN (Licensed Practical Nurse) on 01/05/23 at 1305  Med List Status: <None>   Medication Order Taking? Sig Documenting Provider Last Dose Status Informant  clotrimazole-betamethasone (LOTRISONE) cream [id number] Yes Apply 1 application topically daily. Tawnya Crook, MD Taking Active   diltiazem [facility] CD) 120 MG 24 hr capsule [id number] Yes TAKE ONE CAPSULE BY MOUTH DAILY [name], MD Taking Active   doxepin (SINEQUAN) 25 MG capsule [id number] Yes Take 1 capsule (25 mg total) by mouth at bedtime. [name], MD Taking Active   DULoxetine (CYMBALTA) 60 MG capsule [id number] Yes Take 1 capsule (60 mg total) by mouth daily. [name], MD Taking Active   ezetimibe (ZETIA) 10 MG tablet [id number] Yes Take 1 tablet (10 mg total) by mouth daily. [name], MD Taking Active   gabapentin (NEURONTIN) 800 MG tablet [id number] Yes TAKE TWO TABLETS BY MOUTH THREE TIMES A DAY [name], MD Taking Active   losartan-hydrochlorothiazide [facility]) 100-25 MG tablet [id number] Yes Take 1 tablet by mouth daily. [name], MD Taking Active   metFORMIN (GLUCOPHAGE) 1000 MG tablet [id number] Yes Take 1 tablet (1,000 mg total) by mouth 2 (two) times daily. [name], MD Taking Active   pantoprazole (PROTONIX) 20 MG tablet [id number] Yes TAKE 1 TO 2 TABLETS (20-40 MG TOTAL) BY MOUTH DAILY. [name], MD Taking Active   potassium chloride SA (KLOR-CON M) 20 MEQ tablet [id number] Yes Take 1 tablet (20 mEq total) by mouth daily. [name], MD Taking Active   sodium phosphate (FLEET) 7-19 GM/118ML ENEM [id number] Yes Place 133 mLs (1 enema total) rectally daily as needed for severe constipation or moderate constipation. Jaynee Eagles, PA-C Taking Active             SDOH:  (Social Determinants of Health)  assessments and interventions performed: {yes/no:20286} SDOH Interventions    Flowsheet Row Clinical Support from 01/05/2023 in [facility]  from 07/08/2020 in [facility] Interventions Intervention Not Indicated Other (Comment)  Transportation Interventions Intervention Not Indicated Other (Comment)  Utilities Interventions Intervention Not Indicated --  Financial Strain Interventions Intervention Not Indicated --  Physical Activity Interventions Intervention Not Indicated --  Stress Interventions Intervention Not Indicated --  Social Connections Interventions Intervention Not Indicated --       Medication Assistance: {MEDASSISTANCEINFO:25044}  Medication Access: Within the past 30 days, how often has patient missed a dose of medication? *** Is a pillbox or other method used to improve adherence? {YES/NO:21197} Factors that may affect medication adherence? {CHL DESC; BARRIERS:21522} Are meds synced by current pharmacy? {YES/NO:21197} Are meds delivered by current pharmacy? {YES/NO:21197} Does patient experience delays in picking up medications due to transportation concerns? {YES/NO:21197}  [facility] Reviewed: Is patient disadvantaged to use [facility]?: {YES/NO:21197} Current Rx insurance plan: *** Name and location of Current pharmacy:  [facility] [id number] - [place], [place] [address] Phone: [phone] Fax: [phone]  [facility] [id number] - [address], [place] - [place] [address] Phone: [phone] Fax: [phone]  [facility] [id number] [place], [place] [address] Phone: [phone] Fax: [phone]  [facility] services reviewed with patient today?:  {YES/NO:21197} Patient requests to transfer care to [facility]?: {YES/NO:21197} Reason patient declined to change pharmacies: {US patient preference:27474}  Compliance/Adherence/Medication fill history: Care Gaps: ***  Star-Rating Drugs: ***   Assessment/Plan    Neuropathic Pain (Goal: minimize pain, optimize medication safety)07/23/22 -Controlled, patient states her pain is much improved --Has been taken off of all opioids following suicide attempt.  -Current treatment  Duloxetine 60 mg twice daily Appropriate, Effective, Safe, Accessible Gabapentin 800 mg tablet -2 tablets three times per day Appropriate, Effective, Safe, Accessible -Encouraged family to reach out to local pain mgmt due to upcoming transition to [facility], provided provider information below for pain and psych needs -Gabapentin and Cymbalta have both been increased.  Gabapentin above max dose but pain is well controlled and she is not on any opioids. No changes at this time, able to do ADLs on current dose of pain medications.  QOL is improved with these dose.  Diabetes (A1c goal <7%) 07/23/22 -Controlled, most recent A1c is 6.3% -Current medications: Metformin '1000mg'$  twice daily Appropriate, Effective, Safe, Accessible -Medications previously tried: none noted  -Current home glucose readings fasting glucose: unknown post prandial glucose: unknown -Denies hypoglycemic/hyperglycemic symptoms -Current meal patterns:  Was eating lots of beans, counseled carbohydrate content in beans.  She is increasing her water and has cut back on her sodas.  Counseled on adding Miralax to her water to aid in constipation. -Educated on A1c and blood sugar goals; Prevention and management of hypoglycemic episodes; Benefits of routine self-monitoring of blood sugar; -Counseled to check feet daily and get yearly eye exams -A1c remains controlled, no changes to meds needed continue routine screenings.  MDD PHQ not assessed  during today's visit, not assessed -[facility] referral - pt/son had contacted them this am, however are opting to find [facility] provider in [place] as they are planning on patient living there for the next month.  -Patient's son Timmothy Sours is currently coordinating with [facility] on best facilities to use for psych and pain needs. I provided patient with a list of providers accepting medicare  insurance and new patients. Through [facility] if wanting to further consider referrals -Pain Francis Gaines, MD  PAIN MEDICINE [place] [address] [place] [address]  Phone [phone] Fax [phone] -Psychiatry  Kennieth Francois, MD [facility] [place] [address]  [phone]          Kailah Milch, PharmD Clinical Pharmacist  [place] [phone]

## 2023-01-31 ENCOUNTER — Ambulatory Visit: Payer: Medicare Other | Admitting: Pharmacist

## 2023-01-31 NOTE — Progress Notes (Signed)
Care Management & Coordination Services Pharmacy Note  01/31/2023 Name:  Marilyn Myers MRN:  [id number] DOB:  05-19-1951  Summary: PharmD FU visit.  Doing well, does report some recent weight gain and increased eating lately.  "Eats all of the time."  Her neuropathy is well controlled.  She does verbalize taking her Zetia for elevated cholesterol.    Recommendations/Changes made from today's visit: Due for diabetic kidney eval, eye exam Recheck A1c at next OV Recheck Lipids  Follow up plan: Fu 6 months   Subjective: Marilyn Myers is an 72 y.o. year old female who is a primary patient of [name], MD.  The care coordination team was consulted for assistance with disease management and care coordination needs.    Engaged with patient by telephone for follow up visit.  Recent office visits:  02/22/2022 OV (PCP) [name], MD; Her urine culture returned with a different bacteria than last time: and this was is resistant to macrobid.  So need to stop the macrobid (can keep on hand for future use if needed) and I have ordered cipro to take again for 5 days.  Potassium level is better. Please take the potassium supplement 3x/week and I will recheck levels at her next visit   Recent consult visits:  None since last adherence call  Hospital visits: Medication Reconciliation was completed by comparing discharge summary, patient's EMR and Pharmacy list, and upon discussion with patient.   Admitted to the hospital on 03/11/2021-03/13/2021 due to suicide attempt.   New?Medications Started at [facility] Discharge:?? -started cymbalta 20 mg BID due to neuropathy -diazepam 5 mg TID for muscle spasms    Medications Discontinued at Hospital Discharge: -Stopped all opioids   Medications that remain the same after Hospital Discharge:??  -All other medications will remain the same.    Objective:  Lab Results  Component Value Date   CREATININE 0.99 08/16/2022   BUN 24 (H)  08/16/2022   GFR 56.65 (L) 08/16/2022   GFRNONAA 60 (L) 03/11/2021   GFRAA 77 (L) 07/26/2014   NA 141 08/16/2022   K 4.3 08/16/2022   CALCIUM 9.9 08/16/2022   CO2 31 08/16/2022   GLUCOSE 134 (H) 08/16/2022    Lab Results  Component Value Date/Time   HGBA1C 6.5 (A) 08/16/2022 09:50 AM   HGBA1C 6.3 (A) 05/12/2022 09:27 AM   HGBA1C 7.5 (H) 02/05/2022 11:25 AM   HGBA1C 7.3 (H) 07/01/2021 10:15 AM   HGBA1C 6.4 11/06/2018 12:00 AM   GFR 56.65 (L) 08/16/2022 10:08 AM   GFR 58.17 (L) 05/12/2022 09:47 AM   MICROALBUR 7.5 (H) 02/05/2022 11:25 AM    Last diabetic Eye exam:  Lab Results  Component Value Date/Time   HMDIABEYEEXA No Retinopathy 11/02/2021 12:00 AM    Last diabetic Foot exam: No results found for: "HMDIABFOOTEX"   Lab Results  Component Value Date   CHOL 148 05/12/2022   HDL 44.60 05/12/2022   LDLCALC 70 05/12/2022   LDLDIRECT 144.0 02/05/2022   TRIG 166.0 (H) 05/12/2022   CHOLHDL 3 05/12/2022       Latest Ref Rng & Units 05/12/2022    9:47 AM 02/05/2022   11:25 AM 09/22/2021    3:16 PM  Hepatic Function  Total Protein 6.0 - 8.3 g/dL 7.1  7.6  7.0   Albumin 3.5 - 5.2 g/dL 3.8  4.1    AST 0 - 37 U/L 17  14    ALT 0 - 35 U/L 13  10  Alk Phosphatase 39 - 117 U/L 85  91    Total Bilirubin 0.2 - 1.2 mg/dL 0.8  0.8      Lab Results  Component Value Date/Time   TSH 1.51 02/05/2022 11:25 AM   TSH 1.38 07/01/2021 10:15 AM       Latest Ref Rng & Units 02/05/2022   11:25 AM 07/01/2021   10:15 AM 03/11/2021    4:45 PM  CBC  WBC 4.0 - 10.5 K/uL 9.7  7.4  12.3   Hemoglobin 12.0 - 15.0 g/dL 13.1  12.2  11.8   Hematocrit 36.0 - 46.0 % 39.8  37.5  36.5   Platelets 150.0 - 400.0 K/uL 317.0  282.0  303     Lab Results  Component Value Date/Time   VD25OH 51.36 07/01/2021 10:15 AM   VD25OH 43.9 11/06/2018 12:00 AM   VITAMINB12 506 07/01/2021 10:15 AM    Clinical ASCVD: No  The 10-year ASCVD risk score (Arnett DK, et al., 2019) is: 28%   Values used to  calculate the score:     Age: 61 years     Sex: Female     Is Non-Hispanic African American: No     Diabetic: Yes     Tobacco smoker: No     Systolic Blood Pressure: A999333 mmHg     Is BP treated: Yes     HDL Cholesterol: 44.6 mg/dL     Total Cholesterol: 148 mg/dL       01/05/2023    1:06 PM 11/03/2022    2:28 PM 08/16/2022    9:43 AM  Depression screen PHQ 2/9  Decreased Interest 0 0 0  Down, Depressed, Hopeless 0 0 0  PHQ - 2 Score 0 0 0     Social History   Tobacco Use  Smoking Status Former   Packs/day: 1.00   Years: 25.00   Total pack years: 25.00   Types: Cigarettes   Quit date: 10/07/1996   Years since quitting: 26.3  Smokeless Tobacco Never   BP Readings from Last 3 Encounters:  11/03/22 124/80  08/16/22 100/70  07/05/22 139/88   Pulse Readings from Last 3 Encounters:  11/03/22 96  08/16/22 97  07/05/22 88   Wt Readings from Last 3 Encounters:  01/05/23 192 lb (87.1 kg)  11/03/22 192 lb 6.4 oz (87.3 kg)  08/16/22 185 lb 9.6 oz (84.2 kg)   BMI Readings from Last 3 Encounters:  01/05/23 30.99 kg/m  11/03/22 31.05 kg/m  08/16/22 29.96 kg/m    Allergies  Allergen Reactions   Ace Inhibitors Cough   Codeine Nausea And Vomiting   Flomax [Tamsulosin Hcl] Itching    Medications Reviewed Today     Reviewed by Willette Brace, LPN (Licensed Practical Nurse) on 01/05/23 at 1305  Med List Status: <None>   Medication Order Taking? Sig Documenting Provider Last Dose Status Informant  clotrimazole-betamethasone (LOTRISONE) cream [id number] Yes Apply 1 application topically daily. Tawnya Crook, MD Taking Active   diltiazem [facility] CD) 120 MG 24 hr capsule [id number] Yes TAKE ONE CAPSULE BY MOUTH DAILY [name], MD Taking Active   doxepin (SINEQUAN) 25 MG capsule [id number] Yes Take 1 capsule (25 mg total) by mouth at bedtime. [name], MD Taking Active   DULoxetine (CYMBALTA) 60 MG capsule [id number] Yes Take 1 capsule (60 mg total) by  mouth daily. [name], MD Taking Active   ezetimibe (ZETIA) 10 MG tablet [id number] Yes Take 1 tablet (10 mg total)  by mouth daily. [name], MD Taking Active   gabapentin (NEURONTIN) 800 MG tablet [id number] Yes TAKE TWO TABLETS BY MOUTH THREE TIMES A DAY [name], MD Taking Active   losartan-hydrochlorothiazide [facility]) 100-25 MG tablet [id number] Yes Take 1 tablet by mouth daily. [name], MD Taking Active   metFORMIN (GLUCOPHAGE) 1000 MG tablet [id number] Yes Take 1 tablet (1,000 mg total) by mouth 2 (two) times daily. [name], MD Taking Active   pantoprazole (PROTONIX) 20 MG tablet [id number] Yes TAKE 1 TO 2 TABLETS (20-40 MG TOTAL) BY MOUTH DAILY. [name], MD Taking Active   potassium chloride SA (KLOR-CON M) 20 MEQ tablet [id number] Yes Take 1 tablet (20 mEq total) by mouth daily. [name], MD Taking Active   sodium phosphate (FLEET) 7-19 GM/118ML ENEM [id number] Yes Place 133 mLs (1 enema total) rectally daily as needed for severe constipation or moderate constipation. Jaynee Eagles, PA-C Taking Active             SDOH:  (Social Determinants of Health) assessments and interventions performed: No Financial Resource Strain: Low Risk  (01/05/2023)   Overall Financial Resource Strain (CARDIA)    Difficulty of Paying Living Expenses: Not hard at all   Food Insecurity: No Food Insecurity (01/05/2023)   Hunger Vital Sign    Worried About Running Out of Food in the Last Year: Never true    Ran Out of Food in the Last Year: Never true    SDOH Interventions    Flowsheet Row Clinical Support from 01/05/2023 in [facility] from 07/08/2020 in [facility] Interventions Intervention Not Indicated Other (Comment)  Transportation Interventions Intervention Not Indicated Other (Comment)  Utilities Interventions Intervention Not Indicated --   Financial Strain Interventions Intervention Not Indicated --  Physical Activity Interventions Intervention Not Indicated --  Stress Interventions Intervention Not Indicated --  Social Connections Interventions Intervention Not Indicated --       Medication Assistance: None required.  Patient affirms current coverage meets needs.  Medication Access: Within the past 30 days, how often has patient missed a dose of medication? 0 Is a pillbox or other method used to improve adherence? No  Factors that may affect medication adherence? nonadherence to medications Are meds synced by current pharmacy? No  Are meds delivered by current pharmacy? No  Does patient experience delays in picking up medications due to transportation concerns? No   [facility] Reviewed: Is patient disadvantaged to use [facility]?: No  Current Rx insurance plan: Medicare Name and location of Current pharmacy:  [facility] [id number] - [place], [place] [address] Phone: [phone] Fax: [phone]  [facility] [id number] - [place], [place] - [address] [place] [address] Phone: [phone] Fax: [phone]  [facility] [id number] [place], [place] [address] Phone: [phone] Fax: [phone]  [facility] services reviewed with patient today?: Yes  Patient requests to transfer care to [facility]?: No  Reason patient declined to change pharmacies: Loyalty to other pharmacy/Patient preference  Compliance/Adherence/Medication fill history: Care Gaps: Losartan-HCTZ 100-25 mg last filled 11/25/2022 90 DS Metformin 1000 mg last filled 11/17/2022 90 DS     Care Gaps: Annual wellness visit in last year? Yes   If Diabetic: Last  eye exam / retinopathy screening: 11/02/2021 Last diabetic foot exam:  05/12/2022   Assessment/Plan     Neuropathic Pain (Goal: minimize pain, optimize medication safety)01/31/23 -Controlled, patient states her pain is much improved --Has been taken off of all opioids following suicide attempt.  -Current treatment  Duloxetine 60 mg  daily Appropriate, Effective, Safe, Accessible Gabapentin 800 mg tablet -2 tablets three times per day Appropriate, Effective, Safe, Accessible -Encouraged family to reach out to local pain mgmt due to upcoming transition to [place], provided provider information below for pain and psych needs -Neuropathy is controlled, she feels the best she has in a while.  No changes needed on these meds at this time.  Diabetes (A1c goal <7%) 01/31/23 -Controlled, most recent A1c is 6.5% -Current medications: Metformin '1000mg'$  twice daily Appropriate, Effective, Safe, Accessible -Medications previously tried: none noted  -Current home glucose readings fasting glucose: unknown post prandial glucose: unknown -Denies hypoglycemic/hyperglycemic symptoms -Current meal patterns:  States she has gained some weight, "always hungry".  She eats all of the time per her reports -Educated on A1c and blood sugar goals; Prevention and management of hypoglycemic episodes; -No self monitoring of glucose at home - I am ok with this due to historic A1c - we need to keep monitor of A1c especially with weight gain and reports of eating.   Upcoming PCP FU - recheck A1c and address.  MDD PHQ not assessed during today's visit, not assessed -[facility] referral - pt/son had contacted them this am, however are opting to find [facility] provider in [place] as they are planning on patient living there for the next month.  -Patient's son Timmothy Sours is currently coordinating with Sister on best facilities to use for psych and pain needs. I provided patient with a list of providers accepting medicare insurance and new patients. Through [facility] if wanting to further consider  referrals -Pain Francis Gaines, MD  [facility] [address] [place] [address]  Phone [phone] Fax [phone] -Psychiatry  Kennieth Francois, MD [facility] [address] [place] [address]  [phone]  Care Coordination -provided above provider information to help with [place] care coordination -reviewed discharge medications at length, answered questions related to antibiotic therapy, gabapentin, cymbalta -Will f/u early may to ensure coordination needs are met          Taira Milch, PharmD Clinical Pharmacist  [facility] [phone]

## 2023-02-01 ENCOUNTER — Other Ambulatory Visit: Payer: Self-pay | Admitting: Family Medicine

## 2023-02-02 ENCOUNTER — Other Ambulatory Visit: Payer: Self-pay | Admitting: Family Medicine

## 2023-02-04 ENCOUNTER — Other Ambulatory Visit: Payer: Self-pay | Admitting: Family Medicine

## 2023-02-04 DIAGNOSIS — F339 Major depressive disorder, recurrent, unspecified: Secondary | ICD-10-CM

## 2023-02-04 DIAGNOSIS — K219 Gastro-esophageal reflux disease without esophagitis: Secondary | ICD-10-CM

## 2023-02-14 ENCOUNTER — Ambulatory Visit: Payer: Medicare Other | Admitting: Family Medicine

## 2023-02-15 ENCOUNTER — Other Ambulatory Visit: Payer: Self-pay | Admitting: Family Medicine

## 2023-02-15 DIAGNOSIS — E1165 Type 2 diabetes mellitus with hyperglycemia: Secondary | ICD-10-CM

## 2023-02-28 ENCOUNTER — Telehealth: Payer: Self-pay | Admitting: Family Medicine

## 2023-02-28 NOTE — Telephone Encounter (Signed)
Pt states insurance did not pay for pts CPE because she states we did not put in the correct codes. Please advise.

## 2023-03-07 NOTE — Progress Notes (Signed)
Sent message, via epic in basket, requesting orders in epic from surgeon.  

## 2023-03-08 ENCOUNTER — Ambulatory Visit: Payer: Self-pay | Admitting: Student

## 2023-03-08 DIAGNOSIS — E1142 Type 2 diabetes mellitus with diabetic polyneuropathy: Secondary | ICD-10-CM

## 2023-03-09 ENCOUNTER — Other Ambulatory Visit: Payer: Self-pay | Admitting: Family Medicine

## 2023-03-11 NOTE — Patient Instructions (Addendum)
SURGICAL WAITING ROOM VISITATION  Patients having surgery or a procedure may have no more than 2 support people in the waiting area - these visitors may rotate.    Children under the age of 60 must have an adult with them who is not the patient.  Due to an increase in RSV and influenza rates and associated hospitalizations, children ages 17 and under may not visit patients in [facility] hospitals.  If the patient needs to stay at the hospital during part of their recovery, the visitor guidelines for inpatient rooms apply. Pre-op nurse will coordinate an appropriate time for 1 support person to accompany patient in pre-op.  This support person may not rotate.    Please refer to the [facility] website for the visitor guidelines for Inpatients (after your surgery is over and you are in a regular room).    Your procedure is scheduled on: 03/23/23   Report to [facility] Main Entrance    Report to admitting at 9:40 AM   Call this number if you have problems the morning of surgery [phone]   Do not eat food :After Midnight.   After Midnight you may have the following liquids until 8:55 AM DAY OF SURGERY  Water Non-Citrus Juices (without pulp, NO RED-Apple, White grape, White cranberry) Black Coffee (NO MILK/CREAM OR CREAMERS, sugar ok)  Clear Tea (NO MILK/CREAM OR CREAMERS, sugar ok) regular and decaf                             Plain Jell-O (NO RED)                                           Fruit ices (not with fruit pulp, NO RED)                                     Popsicles (NO RED)                                                               Sports drinks like Gatorade (NO RED)                    The day of surgery:  Drink ONE (1) Pre-Surgery Clear Ensure or G2 at 8:55 AM the morning of surgery. Drink in one sitting. Do not sip.  This drink was given to you during your hospital  pre-op appointment visit. Nothing else to drink after completing the  Pre-Surgery   G2.          If you have questions, please contact your surgeon's office.   FOLLOW BOWEL PREP AND ANY ADDITIONAL PRE OP INSTRUCTIONS YOU RECEIVED FROM YOUR SURGEON'S OFFICE!!!     Oral Hygiene is also important to reduce your risk of infection.                                    Remember - BRUSH YOUR TEETH THE MORNING OF SURGERY WITH YOUR  REGULAR TOOTHPASTE  DENTURES WILL BE REMOVED PRIOR TO SURGERY PLEASE DO NOT APPLY "Poly grip" OR ADHESIVES!!!   Take these medicines the morning of surgery with A SIP OF WATER: Diltiazem, Cymbalta, Ezetimibe, Gabapentin, Protonix   DO NOT TAKE ANY ORAL DIABETIC MEDICATIONS DAY OF YOUR SURGERY  How to Manage Your Diabetes Before and After Surgery  Why is it important to control my blood sugar before and after surgery? Improving blood sugar levels before and after surgery helps healing and can limit problems. A way of improving blood sugar control is eating a healthy diet by:  Eating less sugar and carbohydrates  Increasing activity/exercise  Talking with your doctor about reaching your blood sugar goals High blood sugars (greater than 180 mg/dL) can raise your risk of infections and slow your recovery, so you will need to focus on controlling your diabetes during the weeks before surgery. Make sure that the doctor who takes care of your diabetes knows about your planned surgery including the date and location.  How do I manage my blood sugar before surgery? Check your blood sugar at least 4 times a day, starting 2 days before surgery, to make sure that the level is not too high or low. Check your blood sugar the morning of your surgery when you wake up and every 2 hours until you get to the Short Stay unit. If your blood sugar is less than 70 mg/dL, you will need to treat for low blood sugar: Do not take insulin. Treat a low blood sugar (less than 70 mg/dL) with  cup of clear juice (cranberry or apple), 4 glucose tablets, OR glucose gel. Recheck  blood sugar in 15 minutes after treatment (to make sure it is greater than 70 mg/dL). If your blood sugar is not greater than 70 mg/dL on recheck, call [phone] for further instructions. Report your blood sugar to the short stay nurse when you get to Short Stay.  If you are admitted to the hospital after surgery: Your blood sugar will be checked by the staff and you will probably be given insulin after surgery (instead of oral diabetes medicines) to make sure you have good blood sugar levels. The goal for blood sugar control after surgery is 80-180 mg/dL.   WHAT DO I DO ABOUT MY DIABETES MEDICATION?  Do not take oral diabetes medicines (pills) the morning of surgery.  THE DAY BEFORE SURGERY, take Metformin as prescribed.     THE MORNING OF SURGERY, do not take Metformin.  Reviewed and Endorsed by [facility] Patient Education Committee, August 2015                              You may not have any metal on your body including hair pins, jewelry, and body piercing             Do not wear make-up, lotions, powders, perfumes or deodorant  Do not wear nail polish including gel and S&S, artificial/acrylic nails, or any other type of covering on natural nails including finger and toenails. If you have artificial nails, gel coating, etc. that needs to be removed by a nail salon please have this removed prior to surgery or surgery may need to be canceled/ delayed if the surgeon/ anesthesia feels like they are unable to be safely monitored.   Do not shave  48 hours prior to surgery.    Do not bring valuables to the hospital. [address] IS  NOT             RESPONSIBLE   FOR VALUABLES.   Contacts, glasses, dentures or bridgework may not be worn into surgery.  DO NOT BRING YOUR HOME MEDICATIONS TO THE HOSPITAL. PHARMACY WILL DISPENSE MEDICATIONS LISTED ON YOUR MEDICATION LIST TO YOU DURING YOUR ADMISSION IN THE HOSPITAL!    Patients discharged on the day of surgery will not be allowed to  drive home.  Someone NEEDS to stay with you for the first 24 hours after anesthesia.   Special Instructions: Bring a copy of your healthcare power of attorney and living will documents the day of surgery if you haven't scanned them before.              Please read over the following fact sheets you were given: IF YOU HAVE QUESTIONS ABOUT YOUR PRE-OP INSTRUCTIONS PLEASE CALL [phone] [facility]ras    If you received a COVID test during your pre-op visit  it is requested that you wear a mask when out in public, stay away from anyone that may not be feeling well and notify your surgeon if you develop symptoms. If you test positive for Covid or have been in contact with anyone that has tested positive in the last 10 days please notify you surgeon.    [facility] - Preparing for Surgery Before surgery, you can play an important role.  Because skin is not sterile, your skin needs to be as free of germs as possible.  You can reduce the number of germs on your skin by washing with CHG (chlorahexidine gluconate) soap before surgery.  CHG is an antiseptic cleaner which kills germs and bonds with the skin to continue killing germs even after washing. Please DO NOT use if you have an allergy to CHG or antibacterial soaps.  If your skin becomes reddened/irritated stop using the CHG and inform your nurse when you arrive at Short Stay. Do not shave (including legs and underarms) for at least 48 hours prior to the first CHG shower.  You may shave your face/neck. ______________________________________________________  WHAT IS A BLOOD TRANSFUSION? Blood Transfusion Information  A transfusion is the replacement of blood or some of its parts. Blood is made up of multiple cells which provide different functions. Red blood cells carry oxygen and are used for blood loss replacement. White blood cells fight against infection. Platelets control bleeding. Plasma helps clot blood. Other blood products are available for  specialized needs, such as hemophilia or other clotting disorders. BEFORE THE TRANSFUSION  Who gives blood for transfusions?  Healthy volunteers who are fully evaluated to make sure their blood is safe. This is blood bank blood. Transfusion therapy is the safest it has ever been in the practice of medicine. Before blood is taken from a donor, a complete history is taken to make sure that person has no history of diseases nor engages in risky social behavior (examples are intravenous drug use or sexual activity with multiple partners). The donor's travel history is screened to minimize risk of transmitting infections, such as malaria. The donated blood is tested for signs of infectious diseases, such as HIV and hepatitis. The blood is then tested to be sure it is compatible with you in order to minimize the chance of a transfusion reaction. If you or a relative donates blood, this is often done in anticipation of surgery and is not appropriate for emergency situations. It takes many days to process the donated blood. RISKS AND COMPLICATIONS Although transfusion  therapy is very safe and saves many lives, the main dangers of transfusion include:  Getting an infectious disease. Developing a transfusion reaction. This is an allergic reaction to something in the blood you were given. Every precaution is taken to prevent this. The decision to have a blood transfusion has been considered carefully by your caregiver before blood is given. Blood is not given unless the benefits outweigh the risks. AFTER THE TRANSFUSION Right after receiving a blood transfusion, you will usually feel much better and more energetic. This is especially true if your red blood cells have gotten low (anemic). The transfusion raises the level of the red blood cells which carry oxygen, and this usually causes an energy increase. The nurse administering the transfusion will monitor you carefully for complications. HOME CARE INSTRUCTIONS   No special instructions are needed after a transfusion. You may find your energy is better. Speak with your caregiver about any limitations on activity for underlying diseases you may have. SEEK MEDICAL CARE IF:  Your condition is not improving after your transfusion. You develop redness or irritation at the intravenous (IV) site. SEEK IMMEDIATE MEDICAL CARE IF:  Any of the following symptoms occur over the next 12 hours: Shaking chills. You have a temperature by mouth above 102 F (38.9 C), not controlled by medicine. Chest, back, or muscle pain. People around you feel you are not acting correctly or are confused. Shortness of breath or difficulty breathing. Dizziness and fainting. You get a rash or develop hives. You have a decrease in urine output. Your urine turns a dark color or changes to pink, red, or brown. Any of the following symptoms occur over the next 10 days: You have a temperature by mouth above 102 F (38.9 C), not controlled by medicine. Shortness of breath. Weakness after normal activity. The white part of the eye turns yellow (jaundice). You have a decrease in the amount of urine or are urinating less often. Your urine turns a dark color or changes to pink, red, or brown. Document Released: 11/05/2000 Document Revised: 01/31/2012 Document Reviewed: 06/24/2008 [facility] Patient Information 2014 [place], [place].  _______________________________________________________________________

## 2023-03-11 NOTE — Progress Notes (Addendum)
COVID Vaccine Completed: yes  Date of COVID positive in last 90 days:  PCP - Asencion Partridge, MD Cardiologist - saw in 2015 and 2017 for pre op clearance due to h/o PACs, HTN, DM, OSA  Clearance??  Chest x-ray -  EKG -  Stress Test -  ECHO - 2016 Cardiac Cath -  Pacemaker/ICD device last checked: Spinal Cord Stimulator:  Bowel Prep -   Sleep Study -  CPAP - not using   Fasting Blood Sugar -  Checks Blood Sugar _____ times a day  Last dose of GLP1 agonist-  N/A GLP1 instructions:  N/A   Last dose of SGLT-2 inhibitors-  N/A SGLT-2 instructions: N/A   Blood Thinner Instructions:  Time Aspirin Instructions: Last Dose:  Activity level:  Can go up a flight of stairs and perform activities of daily living without stopping and without symptoms of chest pain or shortness of breath.  Able to exercise without symptoms  Unable to go up a flight of stairs without symptoms of     Anesthesia review:   Patient denies shortness of breath, fever, cough and chest pain at PAT appointment  Patient verbalized understanding of instructions that were given to them at the PAT appointment. Patient was also instructed that they will need to review over the PAT instructions again at home before surgery.

## 2023-03-14 ENCOUNTER — Encounter (HOSPITAL_COMMUNITY): Payer: Self-pay

## 2023-03-14 ENCOUNTER — Other Ambulatory Visit: Payer: Self-pay

## 2023-03-14 ENCOUNTER — Encounter (HOSPITAL_COMMUNITY)
Admission: RE | Admit: 2023-03-14 | Discharge: 2023-03-14 | Disposition: A | Discharge status: Home / Self Care | Payer: Medicare Other | Source: Ambulatory Visit | Attending: Orthopedic Surgery | Admitting: Orthopedic Surgery

## 2023-03-14 VITALS — BP 149/104 | HR 109 | Temp 97.9°F | Resp 16 | Ht 66.0 in | Wt 194.0 lb

## 2023-03-14 DIAGNOSIS — I10 Essential (primary) hypertension: Secondary | ICD-10-CM | POA: Diagnosis not present

## 2023-03-14 DIAGNOSIS — E119 Type 2 diabetes mellitus without complications: Secondary | ICD-10-CM | POA: Diagnosis not present

## 2023-03-14 DIAGNOSIS — Z01818 Encounter for other preprocedural examination: Secondary | ICD-10-CM | POA: Diagnosis not present

## 2023-03-14 HISTORY — DX: Cardiac arrhythmia, unspecified: I49.9

## 2023-03-14 LAB — BASIC METABOLIC PANEL
Anion gap: 10 (ref 5–15)
BUN: 24 mg/dL — ABNORMAL HIGH (ref 8–23)
CO2: 26 mmol/L (ref 22–32)
Calcium: 9.2 mg/dL (ref 8.9–10.3)
Chloride: 100 mmol/L (ref 98–111)
Creatinine, Ser: 0.98 mg/dL (ref 0.44–1.00)
GFR, Estimated: >60 mL/min (ref >60)
Glucose, Bld: 234 mg/dL — ABNORMAL HIGH (ref 70–99)
Potassium: 4 mmol/L (ref 3.5–5.1)
Sodium: 136 mmol/L (ref 135–145)

## 2023-03-14 LAB — CBC
HCT: 36.2 % (ref 36.0–46.0)
Hemoglobin: 11.1 g/dL — ABNORMAL LOW (ref 12.0–15.0)
MCH: 26.5 pg (ref 26.0–34.0)
MCHC: 30.7 g/dL (ref 30.0–36.0)
MCV: 86.4 fL (ref 80.0–100.0)
Platelets: 336 10*3/uL (ref 150–400)
RBC: 4.19 MIL/uL (ref 3.87–5.11)
RDW: 15.5 % (ref 11.5–15.5)
WBC: 7.6 10*3/uL (ref 4.0–10.5)
nRBC: 0 % (ref 0.0–0.2)

## 2023-03-14 LAB — GLUCOSE, CAPILLARY: Glucose-Capillary: 234 mg/dL — ABNORMAL HIGH (ref 70–99)

## 2023-03-14 LAB — SURGICAL PCR SCREEN
MRSA, PCR: NEGATIVE
Staphylococcus aureus: NEGATIVE

## 2023-03-14 LAB — HEMOGLOBIN A1C
Hgb A1c MFr Bld: 8.2 % — ABNORMAL HIGH (ref 4.8–5.6)
Mean Plasma Glucose: 188.64 mg/dL

## 2023-03-14 LAB — TYPE AND SCREEN

## 2023-03-15 NOTE — Progress Notes (Signed)
A1C 8.2, results routed to Dr. Linna Caprice

## 2023-03-23 ENCOUNTER — Ambulatory Visit (HOSPITAL_COMMUNITY): Admission: RE | Admit: 2023-03-23 | Payer: Medicare Other | Source: Ambulatory Visit | Admitting: Orthopedic Surgery

## 2023-03-23 ENCOUNTER — Encounter (HOSPITAL_COMMUNITY): Admission: RE | Payer: Self-pay | Source: Ambulatory Visit

## 2023-03-23 LAB — TYPE AND SCREEN
ABO/RH(D): A POS
Antibody Screen: NEGATIVE

## 2023-03-23 SURGERY — ARTHROPLASTY, HIP, TOTAL, ANTERIOR APPROACH
Anesthesia: Spinal | Site: Hip | Laterality: Right

## 2023-03-24 ENCOUNTER — Ambulatory Visit (INDEPENDENT_AMBULATORY_CARE_PROVIDER_SITE_OTHER): Payer: Medicare Other | Admitting: Family Medicine

## 2023-03-24 ENCOUNTER — Telehealth: Payer: Self-pay | Admitting: Family Medicine

## 2023-03-24 ENCOUNTER — Encounter: Payer: Self-pay | Admitting: Family Medicine

## 2023-03-24 VITALS — BP 126/76 | HR 100 | Temp 98.7°F | Ht 66.0 in | Wt 204.8 lb

## 2023-03-24 DIAGNOSIS — G4733 Obstructive sleep apnea (adult) (pediatric): Secondary | ICD-10-CM | POA: Diagnosis not present

## 2023-03-24 DIAGNOSIS — F339 Major depressive disorder, recurrent, unspecified: Secondary | ICD-10-CM | POA: Diagnosis not present

## 2023-03-24 DIAGNOSIS — Z8739 Personal history of other diseases of the musculoskeletal system and connective tissue: Secondary | ICD-10-CM | POA: Diagnosis not present

## 2023-03-24 DIAGNOSIS — E782 Mixed hyperlipidemia: Secondary | ICD-10-CM

## 2023-03-24 DIAGNOSIS — E1169 Type 2 diabetes mellitus with other specified complication: Secondary | ICD-10-CM | POA: Diagnosis not present

## 2023-03-24 DIAGNOSIS — D649 Anemia, unspecified: Secondary | ICD-10-CM

## 2023-03-24 DIAGNOSIS — I152 Hypertension secondary to endocrine disorders: Secondary | ICD-10-CM

## 2023-03-24 DIAGNOSIS — Z5320 Procedure and treatment not carried out because of patient's decision for unspecified reasons: Secondary | ICD-10-CM

## 2023-03-24 DIAGNOSIS — E1142 Type 2 diabetes mellitus with diabetic polyneuropathy: Secondary | ICD-10-CM

## 2023-03-24 DIAGNOSIS — E1165 Type 2 diabetes mellitus with hyperglycemia: Secondary | ICD-10-CM | POA: Diagnosis not present

## 2023-03-24 DIAGNOSIS — E1159 Type 2 diabetes mellitus with other circulatory complications: Secondary | ICD-10-CM

## 2023-03-24 DIAGNOSIS — Z1231 Encounter for screening mammogram for malignant neoplasm of breast: Secondary | ICD-10-CM

## 2023-03-24 DIAGNOSIS — Z780 Asymptomatic menopausal state: Secondary | ICD-10-CM

## 2023-03-24 LAB — POCT GLYCOSYLATED HEMOGLOBIN (HGB A1C): Hemoglobin A1C: 7.6 % — AB (ref 4.0–5.6)

## 2023-03-24 LAB — COMPREHENSIVE METABOLIC PANEL
ALT: 11 U/L (ref 0–35)
AST: 15 U/L (ref 0–37)
Albumin: 3.7 g/dL (ref 3.5–5.2)
Alkaline Phosphatase: 99 U/L (ref 39–117)
BUN: 16 mg/dL (ref 6–23)
CO2: 29 mEq/L (ref 19–32)
Calcium: 9.5 mg/dL (ref 8.4–10.5)
Chloride: 99 mEq/L (ref 96–112)
Creatinine, Ser: 0.98 mg/dL (ref 0.40–1.20)
GFR: 57.11 mL/min — ABNORMAL LOW (ref >60.00)
Glucose, Bld: 153 mg/dL — ABNORMAL HIGH (ref 70–99)
Potassium: 4.2 mEq/L (ref 3.5–5.1)
Sodium: 140 mEq/L (ref 135–145)
Total Bilirubin: 0.6 mg/dL (ref 0.2–1.2)
Total Protein: 6.8 g/dL (ref 6.0–8.3)

## 2023-03-24 LAB — CBC WITH DIFFERENTIAL/PLATELET
Basophils Absolute: 0 10*3/uL (ref 0.0–0.1)
Basophils Relative: 0.5 % (ref 0.0–3.0)
Eosinophils Absolute: 0.5 10*3/uL (ref 0.0–0.7)
Eosinophils Relative: 7.5 % — ABNORMAL HIGH (ref 0.0–5.0)
HCT: 34.8 % — ABNORMAL LOW (ref 36.0–46.0)
Hemoglobin: 11.2 g/dL — ABNORMAL LOW (ref 12.0–15.0)
Lymphocytes Relative: 24.1 % (ref 12.0–46.0)
Lymphs Abs: 1.7 10*3/uL (ref 0.7–4.0)
MCHC: 32.3 g/dL (ref 30.0–36.0)
MCV: 82.5 fl (ref 78.0–100.0)
Monocytes Absolute: 0.7 10*3/uL (ref 0.1–1.0)
Monocytes Relative: 9.6 % (ref 3.0–12.0)
Neutro Abs: 4.2 10*3/uL (ref 1.4–7.7)
Neutrophils Relative %: 58.3 % (ref 43.0–77.0)
Platelets: 322 10*3/uL (ref 150.0–400.0)
RBC: 4.21 Mil/uL (ref 3.87–5.11)
RDW: 16.3 % — ABNORMAL HIGH (ref 11.5–15.5)
WBC: 7.2 10*3/uL (ref 4.0–10.5)

## 2023-03-24 LAB — LIPID PANEL
Cholesterol: 183 mg/dL (ref 0–200)
HDL: 45.9 mg/dL (ref >39.00)
NonHDL: 137.35
Total CHOL/HDL Ratio: 4
Triglycerides: 299 mg/dL — ABNORMAL HIGH (ref 0.0–149.0)
VLDL: 59.8 mg/dL — ABNORMAL HIGH (ref 0.0–40.0)

## 2023-03-24 LAB — LDL CHOLESTEROL, DIRECT: Direct LDL: 108 mg/dL

## 2023-03-24 LAB — B12 AND FOLATE PANEL
Folate: >23.5 ng/mL (ref >5.9)
Vitamin B-12: 386 pg/mL (ref 211–911)

## 2023-03-24 LAB — MICROALBUMIN / CREATININE URINE RATIO
Creatinine,U: 85.4 mg/dL
Microalb Creat Ratio: 12 mg/g (ref 0.0–30.0)
Microalb, Ur: 10.3 mg/dL — ABNORMAL HIGH (ref 0.0–1.9)

## 2023-03-24 LAB — TSH: TSH: 1.17 u[IU]/mL (ref 0.35–5.50)

## 2023-03-24 MED ORDER — TIRZEPATIDE 2.5 MG/0.5ML ~~LOC~~ SOAJ: 2.5000 mg | SUBCUTANEOUS | 2 refills | Status: DC

## 2023-03-24 NOTE — Patient Instructions (Addendum)
Please return in 3 months for diabetes follow up  Please set up an appointment for a diabetic eye exam and have the results sent to me.   I will release your lab results to you on your MyChart account with further instructions. You may see the results before I do, but when I review them I will send you a message with my report or have my assistant call you if things need to be discussed. Please reply to my message with any questions. Thank you!  If you have any questions or concerns, please don't hesitate to send me a message via MyChart or call the office at [phone]. Thank you for visiting with [place] today! It's our pleasure caring for you.   Please call the office checked below to schedule your appointment for your mammogram and/or bone density screen (the checked studies were ordered): [x]   Mammogram  [x]   Bone Density  []   [facility]     [address] [place], [place]        [phone]         [x]   [facility]  [address] [place], [place]  [phone]

## 2023-03-24 NOTE — Telephone Encounter (Signed)
Son, who has POA, would like a call back concerning pts medical necessity and safety around [facility] and plan to get her blood sugar down.

## 2023-03-24 NOTE — Progress Notes (Signed)
CC:  Chief Complaint  Patient presents with   Diabetes    HPI: Marilyn Myers is a 72 y.o. female who presents to the office today for follow up of diabetes and problems listed above in the chief complaint.  Diabetes follow up: Her diabetic control is reported as Worse.  Reviewed recent preoperative lab work.  A1c was 8.2.  Glucose was 264.  She admits she has been eating worse.  Became "addicted" to milk ducts.  She has gained about 10 pounds.  She denies symptoms of hypoglycemia.  She denies exertional CP or SOB or symptomatic hypoglycemia. She denies foot sores or painful paresthesias.  She is due an eye exam Hypertension is well-controlled on current medications.  No chest pain or shortness of breath Bilateral hip arthritis requiring hip replacements.  Unfortunately due to her uncontrolled diabetes, the surgery was canceled.  Need to get diabetes under control and then can be scheduled.  Using a cane to help ambulate. She reports her depression is well-controlled. Health maintenance: Due for bone density scan, last was in 2019 and was normal.  Due for mammogram.  Colorectal cancer screening is up-to-date. Normocytic anemia: Recent lab work, preoperative, shows mild normocytic anemia.  Wt Readings from Last 3 Encounters:  03/24/23 204 lb 12.8 oz (92.9 kg)  03/14/23 194 lb (88 kg)  01/05/23 192 lb (87.1 kg)    BP Readings from Last 3 Encounters:  03/24/23 126/76  03/14/23 (!) 149/104  11/03/22 124/80    Assessment  1. Uncontrolled type 2 diabetes mellitus with hyperglycemia, without long-term current use of insulin (HCC)   2. Diabetic peripheral neuropathy (HCC)   3. Hypertension associated with diabetes (HCC)   4. Combined hyperlipidemia associated with type 2 diabetes mellitus (HCC)   5. Obstructive sleep apnea   6. Major depression, recurrent, chronic (HCC)   7. History of osteopenia   8. Refusal of statin medication by patient   9. Normochromic anemia      Plan   Diabetes is currently poorly controlled.  Discussed improving diet, stopping candy.  Will try to get Mounjaro 2.5 mg weekly to help with diabetic management and weight loss.  Continue metformin 1000 twice daily.  Renal function stable.  Check urine nephropathy screen.  She is on an ACE inhibitor.  Continue diabetic nephropathy gabapentin. Blood pressure is well-controlled Recheck nonfasting lipids today.  Patient refuses statin.  Continue Zetia Mood is controlled on duloxetine Patient to schedule mammogram and bone density. Workup for normocytic anemia  Follow up: 3 months follow-up diabetes Orders Placed This Encounter  Procedures   Comprehensive metabolic panel   Iron, TIBC and Ferritin Panel   B12 and Folate Panel   CBC with Differential/Platelet   Lipid panel   TSH   Microalbumin / creatinine urine ratio   POCT HgB A1C   No orders of the defined types were placed in this encounter.     Immunization History  Administered Date(s) Administered   Fluad Quad(high Dose 65+) 08/27/2019, 09/22/2021, 08/16/2022   Influenza, High Dose Seasonal PF 08/19/2014, 09/02/2015, 10/08/2016, 09/22/2017, 08/29/2018   Influenza-Unspecified 10/13/2020   PFIZER(Purple Top)SARS-COV-2 Vaccination 12/29/2019, 01/18/2020, 10/13/2020   Pneumococcal Conjugate-13 10/18/2016   Pneumococcal Polysaccharide-23 08/19/2014    Diabetes Related Lab Review: Lab Results  Component Value Date   HGBA1C 8.2 (H) 03/14/2023   HGBA1C 6.5 (A) 08/16/2022   HGBA1C 6.3 (A) 05/12/2022    Lab Results  Component Value Date   MICROALBUR 7.5 (H) 02/05/2022   Lab  Results  Component Value Date   CREATININE 0.98 03/14/2023   BUN 24 (H) 03/14/2023   NA 136 03/14/2023   K 4.0 03/14/2023   CL 100 03/14/2023   CO2 26 03/14/2023   Lab Results  Component Value Date   CHOL 148 05/12/2022   CHOL 212 (H) 02/05/2022   CHOL 197 01/16/2021   Lab Results  Component Value Date   HDL 44.60 05/12/2022   HDL 54.30  02/05/2022   HDL 41.60 01/16/2021   Lab Results  Component Value Date   LDLCALC 70 05/12/2022   LDLCALC 87 11/06/2018   Lab Results  Component Value Date   TRIG 166.0 (H) 05/12/2022   TRIG 224.0 (H) 02/05/2022   TRIG 285.0 (H) 01/16/2021   Lab Results  Component Value Date   CHOLHDL 3 05/12/2022   CHOLHDL 4 02/05/2022   CHOLHDL 5 01/16/2021   Lab Results  Component Value Date   LDLDIRECT 144.0 02/05/2022   LDLDIRECT 117.0 01/16/2021   LDLDIRECT 115.0 08/27/2019   The 10-year ASCVD risk score (Arnett DK, et al., 2019) is: 28.8%   Values used to calculate the score:     Age: 51 years     Sex: Female     Is Non-Hispanic African American: No     Diabetic: Yes     Tobacco smoker: No     Systolic Blood Pressure: 126 mmHg     Is BP treated: Yes     HDL Cholesterol: 44.6 mg/dL     Total Cholesterol: 148 mg/dL I have reviewed the PMH, Fam and Soc history. Patient Active Problem List   Diagnosis Date Noted   Combined hyperlipidemia associated with type 2 diabetes mellitus (HCC) 05/12/2022    Priority: High   Major depression, recurrent, chronic (HCC)     Priority: High   History of suicide attempt - 2022 opiod overdose     Priority: High   Obesity (BMI 30-39.9) 02/22/2019    Priority: High   Diabetic peripheral neuropathy (HCC) 09/28/2016    Priority: High    Failed lyrica    Hypertension associated with diabetes (HCC) 01/28/2015    Priority: High   Obstructive sleep apnea 08/01/2014    Priority: High   Controlled type 2 diabetes mellitus with diabetic polyneuropathy, without long-term current use of insulin (HCC) 06/10/2014    Priority: High   Secondary insomnia 06/02/2022    Priority: Medium    Gastroesophageal reflux disease without esophagitis 01/07/2020    Priority: Medium    History of osteopenia 05/19/2015    Priority: Medium      T = -1.4 Dexa 2016.  Dexa 2019 at GYN: normal at all sites. See records    Diverticulitis of large intestine 08/19/2014     Priority: Medium     Overview:  2015    Nephrolithiasis 08/19/2014    Priority: Medium     Overview:  Dr. Julien Girt    Refusal of statin medication by patient 01/24/2018    Priority: Low    Husband has problems with this medication for her    Seasonal allergic rhinitis 01/24/2018    Priority: Low   PVC's (premature ventricular contractions) 10/08/2016    Priority: Low   S/P colostomy takedown 07/02/2015    Priority: Low   Hydradenitis 08/19/2014    Priority: Low    Social History: Patient  reports that she quit smoking about 26 years ago. Her smoking use included cigarettes. She has a 25.00 pack-year smoking history. She has  never used smokeless tobacco. She reports that she does not drink alcohol and does not use drugs.  Review of Systems: Ophthalmic: negative for eye pain, loss of vision or double vision Cardiovascular: negative for chest pain Respiratory: negative for SOB or persistent cough Gastrointestinal: negative for abdominal pain Genitourinary: negative for dysuria or gross hematuria MSK: negative for foot lesions Neurologic: negative for weakness or gait disturbance  Objective  Vitals: BP 126/76   Pulse 100   Temp 98.7 F (37.1 C)   Ht 5\' 6"  (1.676 m)   Wt 204 lb 12.8 oz (92.9 kg)   SpO2 97%   BMI 33.06 kg/m  General: well appearing, no acute distress  Psych:  Alert and oriented, normal mood and affect HEENT:  Normocephalic, atraumatic, moist mucous membranes, supple neck  Cardiovascular:  Nl S1 and S2, RRR without murmur, gallop or rub. no edema Respiratory:  Good breath sounds bilaterally, CTAB with normal effort, no rales Gastrointestinal: normal BS, soft, nontender Skin:  Warm, no rashes Neurologic:   Mental status is normal. normal gait Diabetic Foot Exam: Appearance - no lesions, ulcers or significant calluses Skin - no sigificant pallor or erythema Normal sensation Pulses - +2 distally bilaterally     Diabetic education: ongoing education  regarding chronic disease management for diabetes was given today. We continue to reinforce the ABC's of diabetic management: A1c (<7 or 8 dependent upon patient), tight blood pressure control, and cholesterol management with goal LDL < 100 minimally. We discuss diet strategies, exercise recommendations, medication options and possible side effects. At each visit, we review recommended immunizations and preventive care recommendations for diabetics and stress that good diabetic control can prevent other problems. See below for this patient's data.   Commons side effects, risks, benefits, and alternatives for medications and treatment plan prescribed today were discussed, and the patient expressed understanding of the given instructions. Patient is instructed to call or message via MyChart if he/she has any questions or concerns regarding our treatment plan. No barriers to understanding were identified. We discussed Red Flag symptoms and signs in detail. Patient expressed understanding regarding what to do in case of urgent or emergency type symptoms.  Medication list was reconciled, printed and provided to the patient in AVS. Patient instructions and summary information was reviewed with the patient as documented in the AVS. This note was prepared with assistance of Dragon voice recognition software. Occasional wrong-word or sound-a-like substitutions may have occurred due to the inherent limitations of voice recognition software

## 2023-03-25 LAB — IRON,TIBC AND FERRITIN PANEL
%SAT: 11 % (calc) — ABNORMAL LOW (ref 16–45)
Ferritin: 8 ng/mL — ABNORMAL LOW (ref 16–288)
Iron: 45 ug/dL (ref 45–160)
TIBC: 393 mcg/dL (calc) (ref 250–450)

## 2023-03-25 NOTE — Progress Notes (Signed)
Please call patient: I have reviewed his/her lab results.  Please let her know that her iron levels and vitamin B12 levels are low.  This is causing her anemia.  Please start over-the-counter iron tablets daily and over-the-counter vitamin B12 1000 mcg daily.  I will recheck in 3 months.  Her other labs are stable

## 2023-03-28 MED ORDER — GLIPIZIDE ER 5 MG PO TB24
5.0000 mg | ORAL_TABLET | Freq: Every day | ORAL | 2 refills | Status: DC
Start: 2023-03-28 — End: 2023-06-23

## 2023-03-28 NOTE — Telephone Encounter (Signed)
Message has been sent to [facility]

## 2023-03-29 ENCOUNTER — Encounter: Payer: Self-pay | Admitting: Family Medicine

## 2023-04-01 ENCOUNTER — Other Ambulatory Visit: Payer: Self-pay | Admitting: Family Medicine

## 2023-04-02 ENCOUNTER — Encounter: Payer: Self-pay | Admitting: Family Medicine

## 2023-04-05 MED ORDER — CLOTRIMAZOLE-BETAMETHASONE 1-0.05 % EX CREA: 1.0000 | TOPICAL_CREAM | Freq: Two times a day (BID) | CUTANEOUS | 1 refills | Status: DC | PRN

## 2023-04-12 DIAGNOSIS — E119 Type 2 diabetes mellitus without complications: Secondary | ICD-10-CM | POA: Diagnosis not present

## 2023-04-12 LAB — HM DIABETES EYE EXAM

## 2023-04-13 ENCOUNTER — Other Ambulatory Visit: Payer: Self-pay | Admitting: Family Medicine

## 2023-05-08 ENCOUNTER — Other Ambulatory Visit: Payer: Self-pay | Admitting: Family Medicine

## 2023-05-31 ENCOUNTER — Telehealth: Payer: Self-pay | Admitting: Family Medicine

## 2023-05-31 DIAGNOSIS — Z1231 Encounter for screening mammogram for malignant neoplasm of breast: Secondary | ICD-10-CM | POA: Diagnosis not present

## 2023-05-31 NOTE — Telephone Encounter (Signed)
Pt states she called [facility] to make the Bone Density appt and they let her know they do not have the order and gave her a fax number that only the provider can use to fax the order. Please advise.    Fax #[phone]

## 2023-06-01 ENCOUNTER — Other Ambulatory Visit: Payer: Self-pay

## 2023-06-01 DIAGNOSIS — Z13820 Encounter for screening for osteoporosis: Secondary | ICD-10-CM

## 2023-06-01 NOTE — Telephone Encounter (Signed)
New Dexa order has been placed.

## 2023-06-17 DIAGNOSIS — N958 Other specified menopausal and perimenopausal disorders: Secondary | ICD-10-CM | POA: Diagnosis not present

## 2023-06-17 DIAGNOSIS — E119 Type 2 diabetes mellitus without complications: Secondary | ICD-10-CM | POA: Diagnosis not present

## 2023-06-17 DIAGNOSIS — M8588 Other specified disorders of bone density and structure, other site: Secondary | ICD-10-CM | POA: Diagnosis not present

## 2023-06-17 LAB — HM MAMMOGRAPHY

## 2023-06-17 LAB — HM DEXA SCAN

## 2023-06-20 ENCOUNTER — Encounter: Payer: Self-pay | Admitting: Family Medicine

## 2023-06-23 ENCOUNTER — Other Ambulatory Visit: Payer: Self-pay | Admitting: Family Medicine

## 2023-06-23 ENCOUNTER — Encounter: Payer: Self-pay | Admitting: Family Medicine

## 2023-06-23 ENCOUNTER — Ambulatory Visit (INDEPENDENT_AMBULATORY_CARE_PROVIDER_SITE_OTHER): Payer: Medicare Other | Admitting: Family Medicine

## 2023-06-23 VITALS — BP 120/76 | HR 92 | Temp 98.7°F | Ht 66.0 in | Wt 200.6 lb

## 2023-06-23 DIAGNOSIS — E1142 Type 2 diabetes mellitus with diabetic polyneuropathy: Secondary | ICD-10-CM | POA: Diagnosis not present

## 2023-06-23 DIAGNOSIS — M160 Bilateral primary osteoarthritis of hip: Secondary | ICD-10-CM | POA: Diagnosis not present

## 2023-06-23 DIAGNOSIS — E1159 Type 2 diabetes mellitus with other circulatory complications: Secondary | ICD-10-CM | POA: Diagnosis not present

## 2023-06-23 DIAGNOSIS — E611 Iron deficiency: Secondary | ICD-10-CM

## 2023-06-23 DIAGNOSIS — M85852 Other specified disorders of bone density and structure, left thigh: Secondary | ICD-10-CM

## 2023-06-23 DIAGNOSIS — I152 Hypertension secondary to endocrine disorders: Secondary | ICD-10-CM

## 2023-06-23 DIAGNOSIS — E538 Deficiency of other specified B group vitamins: Secondary | ICD-10-CM

## 2023-06-23 LAB — POCT GLYCOSYLATED HEMOGLOBIN (HGB A1C): Hemoglobin A1C: 5.9 % — AB (ref 4.0–5.6)

## 2023-06-23 NOTE — Progress Notes (Signed)
Subjective  CC:  Chief Complaint  Patient presents with   Diabetes   Hypertension   Anemia    HPI: Marilyn Myers is a 72 y.o. female who presents to the office today for follow up of diabetes and problems listed above in the chief complaint.  Diabetes follow up: Her diabetic control is reported as Improved.  Started Glucotrol XL 10 mg in addition to metformin in May.  She cannot afford Mounjaro.  Fortunately, has had a very good response.  Also eating less sweets.  Never had symptoms of hyperglycemia.  Last A1c was 8.0.  Needs to have bilateral hip replacements.  She denies exertional CP or SOB or symptomatic hypoglycemia.  Continues on high-dose gabapentin to treat peripheral neuropathy.  No foot sores.  She reports she had a diabetic eye exam, no retinopathy.  We are calling for records Hypertension remains well-controlled without chest pain Taking iron and B12 for oral deficiencies.  Wt Readings from Last 3 Encounters:  06/23/23 200 lb 9.6 oz (91 kg)  03/24/23 204 lb 12.8 oz (92.9 kg)  03/14/23 194 lb (88 kg)    BP Readings from Last 3 Encounters:  06/23/23 120/76  03/24/23 126/76  03/14/23 (!) 149/104    Assessment  1. Controlled type 2 diabetes mellitus with diabetic polyneuropathy, without long-term current use of insulin (HCC)   2. Iron deficiency   3. Vitamin B12 deficiency   4. Diabetic peripheral neuropathy (HCC)   5. Hypertension associated with diabetes (HCC)   6. Osteopenia of left hip   7. Bilateral hip joint arthritis      Plan  Diabetes is currently very well controlled.  She denies symptoms of hypoglycemia.  Now will be medically cleared for hip replacement surgery.  Continue metformin and Glucotrol XL.  Monitor for hypoglycemia. Continue oral iron and B12 supplements Blood pressure is controlled Reviewed recent bone density showing mild worsening of osteopenia left hip, normal spine.  Vitamin D, calcium and after surgery, exercise.  Recheck 2  years  Follow up: 6 months for recheck Orders Placed This Encounter  Procedures   POCT HgB A1C   No orders of the defined types were placed in this encounter.     Immunization History  Administered Date(s) Administered   Fluad Quad(high Dose 65+) 08/27/2019, 09/22/2021, 08/16/2022   Influenza, High Dose Seasonal PF 08/19/2014, 09/02/2015, 10/08/2016, 09/22/2017, 08/29/2018   Influenza-Unspecified 10/13/2020   PFIZER(Purple Top)SARS-COV-2 Vaccination 12/29/2019, 01/18/2020, 10/13/2020   Pneumococcal Conjugate-13 10/18/2016   Pneumococcal Polysaccharide-23 08/19/2014    Diabetes Related Lab Review: Lab Results  Component Value Date   HGBA1C 5.9 (A) 06/23/2023   HGBA1C 7.6 (A) 03/24/2023   HGBA1C 8.2 (H) 03/14/2023    Lab Results  Component Value Date   MICROALBUR 10.3 (H) 03/24/2023   Lab Results  Component Value Date   CREATININE 0.98 03/24/2023   BUN 16 03/24/2023   NA 140 03/24/2023   K 4.2 03/24/2023   CL 99 03/24/2023   CO2 29 03/24/2023   Lab Results  Component Value Date   CHOL 183 03/24/2023   CHOL 148 05/12/2022   CHOL 212 (H) 02/05/2022   Lab Results  Component Value Date   HDL 45.90 03/24/2023   HDL 44.60 05/12/2022   HDL 54.30 02/05/2022   Lab Results  Component Value Date   LDLCALC 70 05/12/2022   LDLCALC 87 11/06/2018   Lab Results  Component Value Date   TRIG 299.0 (H) 03/24/2023   TRIG 166.0 (H) 05/12/2022  TRIG 224.0 (H) 02/05/2022   Lab Results  Component Value Date   CHOLHDL 4 03/24/2023   CHOLHDL 3 05/12/2022   CHOLHDL 4 02/05/2022   Lab Results  Component Value Date   LDLDIRECT 108.0 03/24/2023   LDLDIRECT 144.0 02/05/2022   LDLDIRECT 117.0 01/16/2021   The 10-year ASCVD risk score (Arnett DK, et al., 2019) is: 30%   Values used to calculate the score:     Age: 78 years     Sex: Female     Is Non-Hispanic African American: No     Diabetic: Yes     Tobacco smoker: No     Systolic Blood Pressure: 120 mmHg     Is BP  treated: Yes     HDL Cholesterol: 45.9 mg/dL     Total Cholesterol: 183 mg/dL I have reviewed the PMH, Fam and Soc history. Patient Active Problem List   Diagnosis Date Noted Date Diagnosed   Combined hyperlipidemia associated with type 2 diabetes mellitus (HCC) 05/12/2022     Priority: High   Major depression, recurrent, chronic (HCC)      Priority: High   History of suicide attempt - 2022 opiod overdose      Priority: High   Obesity (BMI 30-39.9) 02/22/2019     Priority: High   Diabetic peripheral neuropathy (HCC) 09/28/2016     Priority: High    Failed lyrica    Hypertension associated with diabetes (HCC) 01/28/2015     Priority: High   Obstructive sleep apnea 08/01/2014     Priority: High   Controlled type 2 diabetes mellitus with diabetic polyneuropathy, without long-term current use of insulin (HCC) 06/10/2014     Priority: High    Has never been on GLP-1 or SGLT2.  Cost prohibitive 2023 Ozempic. Glucotrol XL 5 mg started May 2024, trying to get under better control quickly so she can have hip replacement surgery. Tolerates high-dose metformin    Secondary insomnia 06/02/2022     Priority: Medium    Gastroesophageal reflux disease without esophagitis 01/07/2020     Priority: Medium    Osteopenia 05/19/2015     Priority: Medium     Dexa: 05/2023, lowest T = -1.4 left total femur. FRAX OK. Recheck 2 years. [facility] T = -1.4 Dexa 2016.  Dexa 2019 at GYN: normal at all sites. See records    Diverticulitis of large intestine 08/19/2014     Priority: Medium     Overview:  2015    Nephrolithiasis 08/19/2014     Priority: Medium     Overview:  Dr. Julien Girt    Refusal of statin medication by patient 01/24/2018     Priority: Low    Husband has problems with this medication for her    Seasonal allergic rhinitis 01/24/2018     Priority: Low   PVC's (premature ventricular contractions) 10/08/2016     Priority: Low   S/P colostomy takedown 07/02/2015     Priority: Low    Hydradenitis 08/19/2014     Priority: Low    Social History: Patient  reports that she quit smoking about 26 years ago. Her smoking use included cigarettes. She started smoking about 51 years ago. She has a 25 pack-year smoking history. She has never used smokeless tobacco. She reports that she does not drink alcohol and does not use drugs.  Review of Systems: Ophthalmic: negative for eye pain, loss of vision or double vision Cardiovascular: negative for chest pain Respiratory: negative for SOB or persistent cough  Gastrointestinal: negative for abdominal pain Genitourinary: negative for dysuria or gross hematuria MSK: negative for foot lesions Neurologic: negative for weakness or gait disturbance  Objective  Vitals: BP 120/76   Pulse 92   Temp 98.7 F (37.1 C)   Ht 5\' 6"  (1.676 m)   Wt 200 lb 9.6 oz (91 kg)   SpO2 99%   BMI 32.38 kg/m  General: well appearing, no acute distress  Psych:  Alert and oriented, normal mood and affect HEENT:  Normocephalic, atraumatic, moist mucous membranes, supple neck  Cardiovascular:  Nl S1 and S2, RRR without murmur, gallop or rub. no edema Respiratory:  Good breath sounds bilaterally, CTAB with normal effort, no rales   Diabetic education: ongoing education regarding chronic disease management for diabetes was given today. We continue to reinforce the ABC's of diabetic management: A1c (<7 or 8 dependent upon patient), tight blood pressure control, and cholesterol management with goal LDL < 100 minimally. We discuss diet strategies, exercise recommendations, medication options and possible side effects. At each visit, we review recommended immunizations and preventive care recommendations for diabetics and stress that good diabetic control can prevent other problems. See below for this patient's data.   Commons side effects, risks, benefits, and alternatives for medications and treatment plan prescribed today were discussed, and the patient  expressed understanding of the given instructions. Patient is instructed to call or message via MyChart if he/she has any questions or concerns regarding our treatment plan. No barriers to understanding were identified. We discussed Red Flag symptoms and signs in detail. Patient expressed understanding regarding what to do in case of urgent or emergency type symptoms.  Medication list was reconciled, printed and provided to the patient in AVS. Patient instructions and summary information was reviewed with the patient as documented in the AVS. This note was prepared with assistance of Dragon voice recognition software. Occasional wrong-word or sound-a-like substitutions may have occurred due to the inherent limitations of voice recognition software

## 2023-06-27 ENCOUNTER — Ambulatory Visit: Payer: Medicare Other | Admitting: Family Medicine

## 2023-06-29 ENCOUNTER — Encounter: Payer: Self-pay | Admitting: Family Medicine

## 2023-07-16 ENCOUNTER — Encounter: Payer: Self-pay | Admitting: Family Medicine

## 2023-07-20 ENCOUNTER — Other Ambulatory Visit: Payer: Self-pay

## 2023-07-20 MED ORDER — GABAPENTIN 800 MG PO TABS: 800.0000 mg | ORAL_TABLET | Freq: Three times a day (TID) | ORAL | 3 refills | Status: DC

## 2023-07-21 ENCOUNTER — Other Ambulatory Visit: Payer: Self-pay

## 2023-07-21 MED ORDER — GABAPENTIN 800 MG PO TABS: 800.0000 mg | ORAL_TABLET | Freq: Three times a day (TID) | ORAL | 3 refills | Status: DC

## 2023-08-08 ENCOUNTER — Encounter: Payer: Medicare Other | Admitting: Pharmacist

## 2023-08-08 DIAGNOSIS — M1611 Unilateral primary osteoarthritis, right hip: Secondary | ICD-10-CM | POA: Diagnosis not present

## 2023-11-17 NOTE — Progress Notes (Signed)
Surgery orders requested via Epic inbox. °

## 2023-11-21 ENCOUNTER — Ambulatory Visit: Payer: Self-pay | Admitting: Student

## 2023-11-21 DIAGNOSIS — E119 Type 2 diabetes mellitus without complications: Secondary | ICD-10-CM

## 2023-11-23 NOTE — Progress Notes (Addendum)
 COVID Vaccine received:  []  No [x]  Yes Date of any COVID positive Test in last 90 days:  none  PCP - Lavern Heck, MD [phone]  [phone] (Fax)  Cardiologist - none recently   (09-06-2016 saw [facility]- Duwaine Bailer, NP [place], [place]  for PACs [phone] (Work)  [phone] (Fax)   Chest x-ray - 03-11-2021  1v  Epic EKG -  03-14-2023  Epic Stress Test -  ECHO - 2016  Epic Cardiac Cath -   PCR screen: [x]  Ordered & Completed []   No Order but Needs PROFEND     []   N/A for this surgery  Surgery Plan:  []  Ambulatory   [x]  Outpatient in bed  []  Admit Anesthesia:    []  General  [x]  Spinal  []   Choice []   MAC  Pacemaker / ICD device [x]  No []  Yes   Spinal Cord Stimulator:[x]  No []  Yes       History of Sleep Apnea? []  No [x]  Yes   CPAP used?- [x]  No []  Yes  had weight loss  Does the patient monitor blood sugar?   []  N/A   [x]  No []  Yes  Patient has: []  NO Hx DM   []  Pre-DM   []  DM1  [x]   DM2 Last A1c was:  5.9   on   06-23-2023   Does patient have a [employer] or Dexacom? [x]  No []  Yes    METFORMIN - 1000mg  bid,  Hold DOS GLIPIZIDE  - 5 mg q am,   Day before take as usual in the morning.  Hold DOS.   Blood Thinner / Instructions: none Aspirin  Instructions:  none  ERAS Protocol Ordered: []  No  [x]  Yes PRE-SURGERY []  ENSURE  [x]  G2     Patient is to be NPO after: 0430  Dental hx: []  Dentures:  [x]  N/A      []  Bridge or Partial:                   []  Loose or Damaged teeth:   Comments: Patient was given the 5 CHG shower / bath instructions for THA surgery along with 2 bottles of the CHG soap. Patient will start this on:  11-27-2023 All questions were asked and answered, Patient voiced understanding of this process.   Activity level: Can perform activities of daily living without stopping and without symptoms of chest pain or shortness of breath. No stairs due to hip, pt has stair lift   Anesthesia review: DM2, HTN, anemia, GERD, PVCs, OSA- no CPAP since weight  loss, s/p gastric sleeve 2018.   Patient denies shortness of breath, fever, cough and chest pain at PAT appointment.  Patient verbalized understanding and agreement to the Pre-Surgical Instructions that were given to them at this PAT appointment. Patient was also educated of the need to review these PAT instructions again prior to her surgery.I reviewed the appropriate phone numbers to call if they have any and questions or concerns.

## 2023-11-23 NOTE — Patient Instructions (Addendum)
 SURGICAL WAITING ROOM VISITATION Patients having surgery or a procedure may have no more than 2 support people in the waiting area - these visitors may rotate in the visitor waiting room.   Due to an increase in RSV and influenza rates and associated hospitalizations, children ages 42 and under may not visit patients in [facility]. If the patient needs to stay at the hospital during part of their recovery, the visitor guidelines for inpatient rooms apply.  PRE-OP VISITATION  Pre-op nurse will coordinate an appropriate time for 1 support person to accompany the patient in pre-op.  This support person may not rotate.  This visitor will be contacted when the time is appropriate for the visitor to come back in the pre-op area.  Please refer to the [facility] website for the visitor guidelines for Inpatients (after your surgery is over and you are in a regular room).  You are not required to quarantine at this time prior to your surgery. However, you must do this: Hand Hygiene often Do NOT share personal items Notify your provider if you are in close contact with someone who has COVID or you develop fever 100.4 or greater, new onset of sneezing, cough, sore throat, shortness of breath or body aches.  If you test positive for Covid or have been in contact with anyone that has tested positive in the last 10 days please notify you surgeon.    Your procedure is scheduled on:  THURSDAY  December 01, 2023  Report to [facility] Main Entrance: [address] entrance where the [employer] is available.   Report to admitting at: 05:15    AM  Call this number if you have any questions or problems the morning of surgery [phone]  Do not eat food after Midnight the night prior to your surgery/procedure.  After Midnight you may have the following liquids until  04:30 AM DAY OF SURGERY  Clear Liquid Diet Water  Black Coffee (sugar ok, NO MILK/CREAM OR CREAMERS)  Tea (sugar ok, NO  MILK/CREAM OR CREAMERS) regular and decaf                             Plain Jell-O  with no fruit (NO RED)                                           Fruit ices (not with fruit pulp, NO RED)                                     Popsicles (NO RED)                                                                  Juice: NO CITRUS JUICES: only apple, WHITE grape, WHITE cranberry Sports drinks like Gatorade or Powerade (NO RED)                    The day of surgery:  Drink ONE (1) Pre-Surgery G2 at  04:30 AM the morning of surgery. Drink  in one sitting. Do not sip.  This drink was given to you during your hospital pre-op appointment visit. Nothing else to drink after completing the Pre-Surgery G2 : No candy, chewing gum or throat lozenges.    FOLLOW ANY ADDITIONAL PRE OP INSTRUCTIONS YOU RECEIVED FROM YOUR SURGEON'S OFFICE!!!   Oral Hygiene is also important to reduce your risk of infection.        Remember - BRUSH YOUR TEETH THE MORNING OF SURGERY WITH YOUR REGULAR TOOTHPASTE  Do NOT smoke after Midnight the night before surgery.   METFORMIN - 1000mg  bid, Day before surgery take as usual.   DAY OF SURGERY:  DO NOT TAKE METFORMIN   GLIPIZIDE  - 5 mg q am,   Day before take as usual in the morning.  DAY OF SURGERY:  DO NOT TAKE GLIPIZIDE .    STOP TAKING all Vitamins, Herbs and supplements 1 week before your surgery.   Take ONLY these medicines the morning of surgery with A SIP OF WATER : Duloxetine  (Cymbalta ), gabapentin                     You may not have any metal on your body including hair pins, jewelry, and body piercing  Do not wear make-up, lotions, powders, perfumes or deodorant  Do not wear nail polish including gel and S&S, artificial / acrylic nails, or any other type of covering on natural nails including finger and toenails. If you have artificial nails, gel coating, etc., that needs to be removed by a nail salon, Please have this removed prior to surgery. Not doing so may  mean that your surgery could be cancelled or delayed if the Surgeon or anesthesia staff feels like they are unable to monitor you safely.   Do not shave 48 hours prior to surgery to avoid nicks in your skin which may contribute to postoperative infections.    Contacts, Hearing Aids, dentures or bridgework may not be worn into surgery. DENTURES WILL BE REMOVED PRIOR TO SURGERY PLEASE DO NOT APPLY Poly grip OR ADHESIVES!!!  You may bring a small overnight bag with you on the day of surgery, only pack items that are not valuable.  IS NOT RESPONSIBLE   FOR VALUABLES THAT ARE LOST OR STOLEN.   Do not bring your home medications to the hospital. The Pharmacy will dispense medications listed on your medication list to you during your admission in the Hospital.  Please read over the following fact sheets you were given: IF YOU HAVE QUESTIONS ABOUT YOUR PRE-OP INSTRUCTIONS, PLEASE CALL [phone].     Pre-operative 5 CHG Bath Instructions   You can play a key role in reducing the risk of infection after surgery. Your skin needs to be as free of germs as possible. You can reduce the number of germs on your skin by washing with CHG (chlorhexidine  gluconate) soap before surgery. CHG is an antiseptic soap that kills germs and continues to kill germs even after washing.   DO NOT use if you have an allergy to chlorhexidine /CHG or antibacterial soaps. If your skin becomes reddened or irritated, stop using the CHG and notify one of our RNs at [phone]  Please shower with the CHG soap starting 4 days before surgery using the following schedule: START SHOWERS ON   SUNDAY  November 27, 2023  Please keep in mind the following:  DO NOT shave, including legs and underarms, starting the day of your first shower.   You may  shave your face at any point before/day of surgery.   Place clean sheets on your bed the day you start using CHG soap. Use a clean washcloth (not used since being washed) for each shower. DO NOT sleep with pets once you start using the CHG.   CHG Shower Instructions:  If you choose to wash your hair and private area, wash first with your normal shampoo/soap.  After you use shampoo/soap, rinse your hair and body thoroughly to remove shampoo/soap residue.  Turn the water  OFF and apply about 3 tablespoons (45 ml) of CHG soap to a CLEAN washcloth.  Apply CHG soap ONLY FROM YOUR NECK DOWN TO YOUR TOES (washing for 3-5 minutes)  DO NOT use CHG soap on face, private areas, open wounds, or sores.  Pay special attention to the area where your surgery is being performed.  If you are having back surgery, having someone wash your back for you may be helpful.  Wait 2 minutes after CHG soap is applied, then you may rinse off the CHG soap.  Pat dry with a clean towel  Put on clean clothes/pajamas   If you choose to wear lotion, please use ONLY the CHG-compatible lotions on the back of this paper.     Additional instructions for the day of surgery: DO NOT APPLY any lotions, deodorants, cologne, or perfumes.   Put on clean/comfortable clothes.  Brush your teeth.  Ask your nurse before applying any prescription medications to the skin.      CHG Compatible Lotions   Aveeno Moisturizing lotion  Cetaphil Moisturizing Cream  Cetaphil Moisturizing Lotion  Clairol Herbal Essence Moisturizing Lotion, Dry Skin  Clairol Herbal Essence Moisturizing Lotion, Extra Dry Skin  Clairol Herbal Essence Moisturizing Lotion, Normal Skin  Curel Age Defying Therapeutic Moisturizing Lotion with Alpha Hydroxy  Curel Extreme Care Body Lotion  Curel Soothing Hands Moisturizing Hand Lotion  Curel Therapeutic Moisturizing Cream, Fragrance-Free  Curel Therapeutic Moisturizing Lotion, Fragrance-Free  Curel Therapeutic  Moisturizing Lotion, Original Formula  Eucerin Daily Replenishing Lotion  Eucerin Dry Skin Therapy Plus Alpha Hydroxy Crme  Eucerin Dry Skin Therapy Plus Alpha Hydroxy Lotion  Eucerin Original Crme  Eucerin Original Lotion  Eucerin Plus Crme Eucerin Plus Lotion  Eucerin TriLipid Replenishing Lotion  Keri Anti-Bacterial Hand Lotion  Keri Deep Conditioning Original Lotion Dry Skin Formula Softly Scented  Keri Deep Conditioning Original Lotion, Fragrance Free Sensitive Skin Formula  Keri Lotion Fast Absorbing Fragrance Free Sensitive Skin Formula  Keri Lotion Fast Absorbing Softly Scented Dry Skin Formula  Keri Original Lotion  Keri Skin Renewal Lotion Keri Silky Smooth Lotion  Keri Silky Smooth Sensitive Skin Lotion  Nivea Body Creamy Conditioning Oil  Nivea Body Extra Enriched Lotion  Nivea Body Original Lotion  Nivea Body Sheer Moisturizing Lotion Nivea Crme  Nivea Skin Firming Lotion  NutraDerm 30 Skin Lotion  NutraDerm Skin Lotion  NutraDerm Therapeutic Skin Cream  NutraDerm Therapeutic Skin Lotion  ProShield Protective Hand Cream  Provon moisturizing lotion   FAILURE TO FOLLOW THESE INSTRUCTIONS MAY RESULT IN THE CANCELLATION OF YOUR SURGERY  PATIENT SIGNATURE_________________________________  NURSE SIGNATURE__________________________________  ________________________________________________________________________     Marilyn Myers    An incentive spirometer is a tool that can help keep your lungs clear and active. This tool measures how well you are filling your lungs with each breath. Taking long deep  breaths may help reverse or decrease the chance of developing breathing (pulmonary) problems (especially infection) following: A long period of time when you are unable to move or be active. BEFORE THE PROCEDURE  If the spirometer includes an indicator to show your best effort, your nurse or respiratory therapist will set it to a desired goal. If  possible, sit up straight or lean slightly forward. Try not to slouch. Hold the incentive spirometer in an upright position. INSTRUCTIONS FOR USE  Sit on the edge of your bed if possible, or sit up as far as you can in bed or on a chair. Hold the incentive spirometer in an upright position. Breathe out normally. Place the mouthpiece in your mouth and seal your lips tightly around it. Breathe in slowly and as deeply as possible, raising the piston or the ball toward the top of the column. Hold your breath for 3-5 seconds or for as long as possible. Allow the piston or ball to fall to the bottom of the column. Remove the mouthpiece from your mouth and breathe out normally. Rest for a few seconds and repeat Steps 1 through 7 at least 10 times every 1-2 hours when you are awake. Take your time and take a few normal breaths between deep breaths. The spirometer may include an indicator to show your best effort. Use the indicator as a goal to work toward during each repetition. After each set of 10 deep breaths, practice coughing to be sure your lungs are clear. If you have an incision (the cut made at the time of surgery), support your incision when coughing by placing a pillow or rolled up towels firmly against it. Once you are able to get out of bed, walk around indoors and cough well. You may stop using the incentive spirometer when instructed by your caregiver.  RISKS AND COMPLICATIONS Take your time so you do not get dizzy or light-headed. If you are in pain, you may need to take or ask for pain medication before doing incentive spirometry. It is harder to take a deep breath if you are having pain. AFTER USE Rest and breathe slowly and easily. It can be helpful to keep track of a log of your progress. Your caregiver can provide you with a simple table to help with this. If you are using the spirometer at home, follow these instructions: SEEK MEDICAL CARE IF:  You are having difficultly using the  spirometer. You have trouble using the spirometer as often as instructed. Your pain medication is not giving enough relief while using the spirometer. You develop fever of 100.5 F (38.1 C) or higher.                                                                                                    SEEK IMMEDIATE MEDICAL CARE IF:  You cough up bloody sputum that had not been present before. You develop fever of 102 F (38.9 C) or greater. You develop worsening pain at or near the incision site. MAKE SURE YOU:  Understand these instructions. Will watch your condition. Will get  help right away if you are not doing well or get worse. Document Released: 03/21/2007 Document Revised: 01/31/2012 Document Reviewed: 05/22/2007 [facility] Patient Information 2014 [place], [place].      WHAT IS A BLOOD TRANSFUSION? Blood Transfusion Information  A transfusion is the replacement of blood or some of its parts. Blood is made up of multiple cells which provide different functions. Red blood cells carry oxygen and are used for blood loss replacement. White blood cells fight against infection. Platelets control bleeding. Plasma helps clot blood. Other blood products are available for specialized needs, such as hemophilia or other clotting disorders. BEFORE THE TRANSFUSION  Who gives blood for transfusions?  Healthy volunteers who are fully evaluated to make sure their blood is safe. This is blood bank blood. Transfusion therapy is the safest it has ever been in the practice of medicine. Before blood is taken from a donor, a complete history is taken to make sure that person has no history of diseases nor engages in risky social behavior (examples are intravenous drug use or sexual activity with multiple partners). The donor's travel history is screened to minimize risk of transmitting infections, such as malaria. The donated blood is tested for signs of infectious diseases, such as HIV and hepatitis. The  blood is then tested to be sure it is compatible with you in order to minimize the chance of a transfusion reaction. If you or a relative donates blood, this is often done in anticipation of surgery and is not appropriate for emergency situations. It takes many days to process the donated blood. RISKS AND COMPLICATIONS Although transfusion therapy is very safe and saves many lives, the main dangers of transfusion include:  Getting an infectious disease. Developing a transfusion reaction. This is an allergic reaction to something in the blood you were given. Every precaution is taken to prevent this. The decision to have a blood transfusion has been considered carefully by your caregiver before blood is given. Blood is not given unless the benefits outweigh the risks. AFTER THE TRANSFUSION Right after receiving a blood transfusion, you will usually feel much better and more energetic. This is especially true if your red blood cells have gotten low (anemic). The transfusion raises the level of the red blood cells which carry oxygen, and this usually causes an energy increase. The nurse administering the transfusion will monitor you carefully for complications. HOME CARE INSTRUCTIONS  No special instructions are needed after a transfusion. You may find your energy is better. Speak with your caregiver about any limitations on activity for underlying diseases you may have. SEEK MEDICAL CARE IF:  Your condition is not improving after your transfusion. You develop redness or irritation at the intravenous (IV) site. SEEK IMMEDIATE MEDICAL CARE IF:  Any of the following symptoms occur over the next 12 hours: Shaking chills. You have a temperature by mouth above 102 F (38.9 C), not controlled by medicine. Chest, back, or muscle pain. People around you feel you are not acting correctly or are confused. Shortness of breath or difficulty breathing. Dizziness and fainting. You get a rash or develop  hives. You have a decrease in urine output. Your urine turns a dark color or changes to pink, red, or brown. Any of the following symptoms occur over the next 10 days: You have a temperature by mouth above 102 F (38.9 C), not controlled by medicine. Shortness of breath. Weakness after normal activity. The white part of the eye turns yellow (jaundice). You have a decrease in  the amount of urine or are urinating less often. Your urine turns a dark color or changes to pink, red, or brown. Document Released: 11/05/2000 Document Revised: 01/31/2012 Document Reviewed: 06/24/2008 [facility] Patient Information 2014 [place], [place].  _______________________________________________________________________

## 2023-11-25 ENCOUNTER — Other Ambulatory Visit: Payer: Self-pay

## 2023-11-25 ENCOUNTER — Encounter (HOSPITAL_COMMUNITY)
Admission: RE | Admit: 2023-11-25 | Discharge: 2023-11-25 | Disposition: A | Discharge status: Home / Self Care | Payer: Medicare Other | Source: Ambulatory Visit | Attending: Orthopedic Surgery | Admitting: Orthopedic Surgery

## 2023-11-25 ENCOUNTER — Encounter (HOSPITAL_COMMUNITY): Payer: Self-pay

## 2023-11-25 VITALS — BP 106/60 | HR 102 | Temp 97.8°F | Resp 22 | Ht 66.0 in | Wt 200.0 lb

## 2023-11-25 DIAGNOSIS — I152 Hypertension secondary to endocrine disorders: Secondary | ICD-10-CM | POA: Diagnosis not present

## 2023-11-25 DIAGNOSIS — Z01812 Encounter for preprocedural laboratory examination: Secondary | ICD-10-CM | POA: Insufficient documentation

## 2023-11-25 DIAGNOSIS — E1159 Type 2 diabetes mellitus with other circulatory complications: Secondary | ICD-10-CM | POA: Diagnosis not present

## 2023-11-25 DIAGNOSIS — E119 Type 2 diabetes mellitus without complications: Secondary | ICD-10-CM

## 2023-11-25 DIAGNOSIS — Z01818 Encounter for other preprocedural examination: Secondary | ICD-10-CM

## 2023-11-25 DIAGNOSIS — E1142 Type 2 diabetes mellitus with diabetic polyneuropathy: Secondary | ICD-10-CM

## 2023-11-25 HISTORY — DX: Unspecified osteoarthritis, unspecified site: M19.90

## 2023-11-25 LAB — CBC
HCT: 42.1 % (ref 36.0–46.0)
Hemoglobin: 14 g/dL (ref 12.0–15.0)
MCH: 32 pg (ref 26.0–34.0)
MCHC: 33.3 g/dL (ref 30.0–36.0)
MCV: 96.1 fL (ref 80.0–100.0)
Platelets: 321 10*3/uL (ref 150–400)
RBC: 4.38 MIL/uL (ref 3.87–5.11)
RDW: 13.3 % (ref 11.5–15.5)
WBC: 9.4 10*3/uL (ref 4.0–10.5)
nRBC: 0 % (ref 0.0–0.2)

## 2023-11-25 LAB — COMPREHENSIVE METABOLIC PANEL
ALT: 13 U/L (ref 0–44)
AST: 19 U/L (ref 15–41)
Albumin: 3.7 g/dL (ref 3.5–5.0)
Alkaline Phosphatase: 76 U/L (ref 38–126)
Anion gap: 12 (ref 5–15)
BUN: 25 mg/dL — ABNORMAL HIGH (ref 8–23)
CO2: 22 mmol/L (ref 22–32)
Calcium: 9.3 mg/dL (ref 8.9–10.3)
Chloride: 102 mmol/L (ref 98–111)
Creatinine, Ser: 1.08 mg/dL — ABNORMAL HIGH (ref 0.44–1.00)
GFR, Estimated: 54 mL/min — ABNORMAL LOW (ref >60)
Glucose, Bld: 97 mg/dL (ref 70–99)
Potassium: 3.9 mmol/L (ref 3.5–5.1)
Sodium: 136 mmol/L (ref 135–145)
Total Bilirubin: 0.8 mg/dL (ref 0.0–1.2)
Total Protein: 7.7 g/dL (ref 6.5–8.1)

## 2023-11-25 LAB — SURGICAL PCR SCREEN
MRSA, PCR: NEGATIVE
Staphylococcus aureus: NEGATIVE

## 2023-11-25 LAB — GLUCOSE, CAPILLARY: Glucose-Capillary: 94 mg/dL (ref 70–99)

## 2023-11-26 LAB — HEMOGLOBIN A1C
Hgb A1c MFr Bld: 6.2 % — ABNORMAL HIGH (ref 4.8–5.6)
Mean Plasma Glucose: 131 mg/dL

## 2023-11-28 ENCOUNTER — Ambulatory Visit: Payer: Self-pay | Admitting: Student

## 2023-11-28 NOTE — H&P (Signed)
 TOTAL HIP ADMISSION H&P  Patient is admitted for right total hip arthroplasty.  Subjective:  Chief Complaint: right hip pain  HPI: Marilyn Myers, 73 y.o. female, has a history of pain and functional disability in the right hip(s) due to arthritis and patient has failed non-surgical conservative treatments for greater than 12 weeks to include NSAID's and/or analgesics, corticosteriod injections, flexibility and strengthening excercises, use of assistive devices, and activity modification.  Onset of symptoms was gradual starting 10 years ago with rapidlly worsening course since that time.The patient noted no past surgery on the right hip(s).  Patient currently rates pain in the right hip at 10 out of 10 with activity. Patient has night pain, worsening of pain with activity and weight bearing, trendelenberg gait, pain that interfers with activities of daily living, and pain with passive range of motion. Patient has evidence of subchondral cysts, subchondral sclerosis, periarticular osteophytes, and joint space narrowing by imaging studies. This condition presents safety issues increasing the risk of falls.  There is no current active infection.  Patient Active Problem List   Diagnosis Date Noted   Secondary insomnia 06/02/2022   Combined hyperlipidemia associated with type 2 diabetes mellitus (HCC) 05/12/2022   Major depression, recurrent, chronic (HCC)    History of suicide attempt - 2022 opiod overdose    Gastroesophageal reflux disease without esophagitis 01/07/2020   Obesity (BMI 30-39.9) 02/22/2019   Refusal of statin medication by patient 01/24/2018   Seasonal allergic rhinitis 01/24/2018   PVC's (premature ventricular contractions) 10/08/2016   Diabetic peripheral neuropathy (HCC) 09/28/2016   S/P colostomy takedown 07/02/2015   Osteopenia 05/19/2015   Hypertension associated with diabetes (HCC) 01/28/2015   Diverticulitis of large intestine 08/19/2014   Hydradenitis 08/19/2014    Nephrolithiasis 08/19/2014   Obstructive sleep apnea 08/01/2014   Controlled type 2 diabetes mellitus with diabetic polyneuropathy, without long-term current use of insulin  (HCC) 06/10/2014   Past Medical History:  Diagnosis Date   Anxiety    Arthritis    Diverticulitis of intestine without perforation or abscess    Diverticulosis, sigmoid    Dysrhythmia    GERD (gastroesophageal reflux disease)    History of diverticulitis of colon    09/ 2015  RESOLVED    History of kidney stones    Hypertension    Nephrolithiasis    BILATERAL   OSA on CPAP    no longer uses    Right ureteral stone    Seasonal allergic rhinitis 01/24/2018   Type 2 diabetes mellitus (HCC)    type 2     Past Surgical History:  Procedure Laterality Date   ABDOMINAL WOUND DEHISCENCE  2017   wound vac placement,  had exploratory laparotomy   BARIATRIC SURGERY     CATARACT EXTRACTION Bilateral    CERVICAL CONE BIOPSY  1976   COLON RESECTION     colostomy x 6 months   CYSTO/  BILATERAL RETROGRADE PYELOGRAM/  PLACEMENT BILATERAL URETERAL STENTS  07/10/2011   CYSTOSCOPY WITH RETROGRADE PYELOGRAM, URETEROSCOPY AND STENT PLACEMENT Right 10/11/2014   Procedure: CYSTOSCOPY WITH RETROGRADE PYELOGRAM, URETEROSCOPY, right ureteral biopsy, AND STENT PLACEMENT;  Surgeon: Oliva VEAR Oiler, MD;  Location: [facility];  Service: Urology;  Laterality: Right;   CYSTOSCOPY/RETROGRADE/URETEROSCOPY  10/08/2011   Procedure: CYSTOSCOPY/RETROGRADE/URETEROSCOPY;  Surgeon: Thomasine Oiler, MD;  Location: [facility];  Service: Urology;  Laterality: N/A;  CYSTOSCOPY, RIGHT  RETROGRADE, RIGHT URETEROSCOPY HOLMIUM LASER WITH BASKET STONE EXTRACTION AND RIGHT URETERAL STENT PLACEMENT   CYSTOSCOPY/URETEROSCOPY/HOLMIUM  LASER/STENT PLACEMENT Right 02/20/2021   Procedure: CYSTOSCOPY RETROGRADE/URETEROSCOPY/HOLMIUM LASER/STENT PLACEMENT/BASKET STONE EXTRACTION;  Surgeon: Selma Donnice SAUNDERS, MD;  Location: [facility];  Service:  Urology;  Laterality: Right;  ONLY NEEDS 30 MIN   EXTRACORPOREAL SHOCK WAVE LITHOTRIPSY  x3  last one 01-24-2012   TUBAL LIGATION  1987   URETEROSCPIC STONE EXTRACTION/ STENT PLACEMENT  left 08-20-2011    Current Outpatient Medications  Medication Sig Dispense Refill Last Dose/Taking   Cyanocobalamin  (VITAMIN B-12 PO) Take 1 tablet by mouth in the morning.      diltiazem  (CARDIZEM  CD) 120 MG 24 hr capsule TAKE 1 CAPSULE BY MOUTH DAILY (Patient taking differently: Take 120 mg by mouth every evening.) 90 capsule 3    docusate sodium  (COLACE) 100 MG capsule Take 100 mg by mouth 2 (two) times daily.      doxepin  (SINEQUAN ) 25 MG capsule TAKE 1 CAPSULE BY MOUTH AT BEDTIME (Patient taking differently: Take 25 mg by mouth at bedtime as needed (sleep).) 90 capsule 3    DULoxetine  (CYMBALTA ) 60 MG capsule TAKE ONE CAPSULE BY MOUTH DAILY 90 capsule 3    ezetimibe  (ZETIA ) 10 MG tablet TAKE ONE TABLET BY MOUTH DAILY 90 tablet 3    ferrous sulfate  325 (65 FE) MG tablet Take 325 mg by mouth every evening.      gabapentin  (NEURONTIN ) 800 MG tablet Take 1 tablet (800 mg total) by mouth 3 (three) times daily. TAKE 2 TABLETS 3 TIMES A DAY BY MOUTH (Patient taking differently: Take 1,600 mg by mouth 3 (three) times daily.) 540 tablet 3    glipiZIDE  (GLUCOTROL  XL) 5 MG 24 hr tablet TAKE 1 TABLET BY MOUTH DAILY WITH BREAKFAST 90 tablet 3    losartan -hydrochlorothiazide  (HYZAAR) 100-25 MG tablet TAKE ONE TABLET BY MOUTH DAILY 90 tablet 3    metFORMIN  (GLUCOPHAGE ) 1000 MG tablet TAKE ONE TABLET BY MOUTH TWICE A DAY 180 tablet 3    Multiple Vitamins-Minerals (MULTIVITAMIN WITH MINERALS) tablet Take 1 tablet by mouth every evening.      nystatin  (MYCOSTATIN /NYSTOP ) powder Apply 1 Application topically 3 (three) times daily as needed (skin irritation/rash.).      pantoprazole  (PROTONIX ) 20 MG tablet TAKE 1 TO 2 TABLETS BY MOUTH DAILY (Patient taking differently: Take 20 mg by mouth every evening.) 180 tablet 3     Potassium Chloride  ER 20 MEQ TBCR TAKE ONE TABLET BY MOUTH DAILY 90 tablet 3    No current facility-administered medications for this visit.   Allergies  Allergen Reactions   Ace Inhibitors Cough   Codeine Nausea And Vomiting   Flomax [Tamsulosin Hcl] Itching    Social History   Tobacco Use   Smoking status: Former    Current packs/day: 0.00    Average packs/day: 1 pack/day for 25.0 years (25.0 ttl pk-yrs)    Types: Cigarettes    Start date: 10/08/1971    Quit date: 10/07/1996    Years since quitting: 27.1   Smokeless tobacco: Never  Substance Use Topics   Alcohol  use: Never    Family History  Problem Relation Age of Onset   Breast cancer Mother        55   Renal Disease Mother    Heart disease Mother    Diabetes Paternal Grandmother    Diabetes Maternal Aunt      Review of Systems  Musculoskeletal:  Positive for arthralgias and gait problem.  All other systems reviewed and are negative.   Objective:  Physical Exam Constitutional:  Appearance: Normal appearance.  HENT:     Head: Normocephalic and atraumatic.     Nose: Nose normal.     Mouth/Throat:     Mouth: Mucous membranes are moist.     Pharynx: Oropharynx is clear.  Eyes:     Conjunctiva/sclera: Conjunctivae normal.  Cardiovascular:     Rate and Rhythm: Normal rate and regular rhythm.     Pulses: Normal pulses.     Heart sounds: Normal heart sounds.  Pulmonary:     Effort: Pulmonary effort is normal.     Breath sounds: Normal breath sounds.  Abdominal:     General: Abdomen is flat.     Palpations: Abdomen is soft.  Genitourinary:    Comments: Deferred.  Musculoskeletal:     Cervical back: Normal range of motion and neck supple.     Comments: Examination of the right hip reveals no skin wounds or lesions. Mild trochanteric tenderness to palpation. She has severely restricted range of motion of the right hip. Pain with terminal flexion and rotation. Pain in the position of impingement. Positive  Stinchfield.  Distally, there is no focal motor or sensory deficit. She has subjective sensory change consistent with diabetic neuropathy. She has palpable pedal pulses.  She ambulates with an antalgic gait.  Skin:    General: Skin is warm and dry.     Capillary Refill: Capillary refill takes less than 2 seconds.  Neurological:     General: No focal deficit present.     Mental Status: She is alert and oriented to person, place, and time.  Psychiatric:        Mood and Affect: Mood normal.        Behavior: Behavior normal.        Thought Content: Thought content normal.        Judgment: Judgment normal.     Vital signs in last 24 hours: @VSRANGES @  Labs:   Estimated body mass index is 32.28 kg/m as calculated from the following:   Height as of 11/25/23: 5' 6 (1.676 m).   Weight as of 11/25/23: 90.7 kg.   Imaging Review Plain radiographs demonstrate severe degenerative joint disease of the right hip(s). The bone quality appears to be adequate for age and reported activity level.      Assessment/Plan:  End stage arthritis, right hip(s)  The patient history, physical examination, clinical judgement of the provider and imaging studies are consistent with end stage degenerative joint disease of the right hip(s) and total hip arthroplasty is deemed medically necessary. The treatment options including medical management, injection therapy, arthroscopy and arthroplasty were discussed at length. The risks and benefits of total hip arthroplasty were presented and reviewed. The risks due to aseptic loosening, infection, stiffness, dislocation/subluxation,  thromboembolic complications and other imponderables were discussed.  The patient acknowledged the explanation, agreed to proceed with the plan and consent was signed. Patient is being admitted for inpatient treatment for surgery, pain control, PT, OT, prophylactic antibiotics, VTE prophylaxis, progressive ambulation and ADL's and  discharge planning.The patient is planning to be discharged home with HEP after an overnight stay.   Therapy Plans: HEP.  Disposition: Home with son.  Planned DVT Prophylaxis: aspirin  81mg  BID.  DME needed: walker. No rollator after surgery.  PCP: Cleared TXA: IV Allergies:  - ACE inhibitors - cough - Codeine - vomiting - Tamsulosin - hives Anesthesia Concerns: None.  BMI: 33.4 Last HgbA1c: 5.9 Other: - T2DM. Diabetic neuropathy. - OSA, doesn't use CPAP.  - Right  hip IA injection 12/01/22.  - Nystatin  powder for pannicular fold and groin. Clear today. patient aware has to be clear in order to proceed with surgery.  - History of gastric bypass surgery. NO NSAIDs.  - Patient does not want any narcotic medication after surgery. Will try hydrocodone  in hospital. She only wants tylenol , zofran . We will see how she does.  - 11/24/22: Hgb 14.0, Cr. 1.08.    Patient's anticipated LOS is less than 2 midnights, meeting these requirements: - Younger than 52 - Lives within 1 hour of care - Has a competent adult at home to recover with post-op recover - NO history of  - Chronic pain requiring opiods  - Diabetes  - Coronary Artery Disease  - Heart failure  - Heart attack  - Stroke  - DVT/VTE  - Cardiac arrhythmia  - Respiratory Failure/COPD  - Renal failure  - Anemia  - Advanced Liver disease

## 2023-11-28 NOTE — H&P (View-Only) (Signed)
 TOTAL HIP ADMISSION H&P  Patient is admitted for right total hip arthroplasty.  Subjective:  Chief Complaint: right hip pain  HPI: Marilyn Myers, 73 y.o. female, has a history of pain and functional disability in the right hip(s) due to arthritis and patient has failed non-surgical conservative treatments for greater than 12 weeks to include NSAID's and/or analgesics, corticosteriod injections, flexibility and strengthening excercises, use of assistive devices, and activity modification.  Onset of symptoms was gradual starting 10 years ago with rapidlly worsening course since that time.The patient noted no past surgery on the right hip(s).  Patient currently rates pain in the right hip at 10 out of 10 with activity. Patient has night pain, worsening of pain with activity and weight bearing, trendelenberg gait, pain that interfers with activities of daily living, and pain with passive range of motion. Patient has evidence of subchondral cysts, subchondral sclerosis, periarticular osteophytes, and joint space narrowing by imaging studies. This condition presents safety issues increasing the risk of falls.  There is no current active infection.  Patient Active Problem List   Diagnosis Date Noted   Secondary insomnia 06/02/2022   Combined hyperlipidemia associated with type 2 diabetes mellitus (HCC) 05/12/2022   Major depression, recurrent, chronic (HCC)    History of suicide attempt - 2022 opiod overdose    Gastroesophageal reflux disease without esophagitis 01/07/2020   Obesity (BMI 30-39.9) 02/22/2019   Refusal of statin medication by patient 01/24/2018   Seasonal allergic rhinitis 01/24/2018   PVC's (premature ventricular contractions) 10/08/2016   Diabetic peripheral neuropathy (HCC) 09/28/2016   S/P colostomy takedown 07/02/2015   Osteopenia 05/19/2015   Hypertension associated with diabetes (HCC) 01/28/2015   Diverticulitis of large intestine 08/19/2014   Hydradenitis 08/19/2014    Nephrolithiasis 08/19/2014   Obstructive sleep apnea 08/01/2014   Controlled type 2 diabetes mellitus with diabetic polyneuropathy, without long-term current use of insulin  (HCC) 06/10/2014   Past Medical History:  Diagnosis Date   Anxiety    Arthritis    Diverticulitis of intestine without perforation or abscess    Diverticulosis, sigmoid    Dysrhythmia    GERD (gastroesophageal reflux disease)    History of diverticulitis of colon    09/ 2015  RESOLVED    History of kidney stones    Hypertension    Nephrolithiasis    BILATERAL   OSA on CPAP    no longer uses    Right ureteral stone    Seasonal allergic rhinitis 01/24/2018   Type 2 diabetes mellitus (HCC)    type 2     Past Surgical History:  Procedure Laterality Date   ABDOMINAL WOUND DEHISCENCE  2017   wound vac placement,  had exploratory laparotomy   BARIATRIC SURGERY     CATARACT EXTRACTION Bilateral    CERVICAL CONE BIOPSY  1976   COLON RESECTION     colostomy x 6 months   CYSTO/  BILATERAL RETROGRADE PYELOGRAM/  PLACEMENT BILATERAL URETERAL STENTS  07/10/2011   CYSTOSCOPY WITH RETROGRADE PYELOGRAM, URETEROSCOPY AND STENT PLACEMENT Right 10/11/2014   Procedure: CYSTOSCOPY WITH RETROGRADE PYELOGRAM, URETEROSCOPY, right ureteral biopsy, AND STENT PLACEMENT;  Surgeon: Oliva VEAR Oiler, MD;  Location: [facility];  Service: Urology;  Laterality: Right;   CYSTOSCOPY/RETROGRADE/URETEROSCOPY  10/08/2011   Procedure: CYSTOSCOPY/RETROGRADE/URETEROSCOPY;  Surgeon: Thomasine Oiler, MD;  Location: [facility];  Service: Urology;  Laterality: N/A;  CYSTOSCOPY, RIGHT  RETROGRADE, RIGHT URETEROSCOPY HOLMIUM LASER WITH BASKET STONE EXTRACTION AND RIGHT URETERAL STENT PLACEMENT   CYSTOSCOPY/URETEROSCOPY/HOLMIUM  LASER/STENT PLACEMENT Right 02/20/2021   Procedure: CYSTOSCOPY RETROGRADE/URETEROSCOPY/HOLMIUM LASER/STENT PLACEMENT/BASKET STONE EXTRACTION;  Surgeon: Selma Donnice SAUNDERS, MD;  Location: [facility];  Service:  Urology;  Laterality: Right;  ONLY NEEDS 30 MIN   EXTRACORPOREAL SHOCK WAVE LITHOTRIPSY  x3  last one 01-24-2012   TUBAL LIGATION  1987   URETEROSCPIC STONE EXTRACTION/ STENT PLACEMENT  left 08-20-2011    Current Outpatient Medications  Medication Sig Dispense Refill Last Dose/Taking   Cyanocobalamin  (VITAMIN B-12 PO) Take 1 tablet by mouth in the morning.      diltiazem  (CARDIZEM  CD) 120 MG 24 hr capsule TAKE 1 CAPSULE BY MOUTH DAILY (Patient taking differently: Take 120 mg by mouth every evening.) 90 capsule 3    docusate sodium  (COLACE) 100 MG capsule Take 100 mg by mouth 2 (two) times daily.      doxepin  (SINEQUAN ) 25 MG capsule TAKE 1 CAPSULE BY MOUTH AT BEDTIME (Patient taking differently: Take 25 mg by mouth at bedtime as needed (sleep).) 90 capsule 3    DULoxetine  (CYMBALTA ) 60 MG capsule TAKE ONE CAPSULE BY MOUTH DAILY 90 capsule 3    ezetimibe  (ZETIA ) 10 MG tablet TAKE ONE TABLET BY MOUTH DAILY 90 tablet 3    ferrous sulfate  325 (65 FE) MG tablet Take 325 mg by mouth every evening.      gabapentin  (NEURONTIN ) 800 MG tablet Take 1 tablet (800 mg total) by mouth 3 (three) times daily. TAKE 2 TABLETS 3 TIMES A DAY BY MOUTH (Patient taking differently: Take 1,600 mg by mouth 3 (three) times daily.) 540 tablet 3    glipiZIDE  (GLUCOTROL  XL) 5 MG 24 hr tablet TAKE 1 TABLET BY MOUTH DAILY WITH BREAKFAST 90 tablet 3    losartan -hydrochlorothiazide  (HYZAAR) 100-25 MG tablet TAKE ONE TABLET BY MOUTH DAILY 90 tablet 3    metFORMIN  (GLUCOPHAGE ) 1000 MG tablet TAKE ONE TABLET BY MOUTH TWICE A DAY 180 tablet 3    Multiple Vitamins-Minerals (MULTIVITAMIN WITH MINERALS) tablet Take 1 tablet by mouth every evening.      nystatin  (MYCOSTATIN /NYSTOP ) powder Apply 1 Application topically 3 (three) times daily as needed (skin irritation/rash.).      pantoprazole  (PROTONIX ) 20 MG tablet TAKE 1 TO 2 TABLETS BY MOUTH DAILY (Patient taking differently: Take 20 mg by mouth every evening.) 180 tablet 3     Potassium Chloride  ER 20 MEQ TBCR TAKE ONE TABLET BY MOUTH DAILY 90 tablet 3    No current facility-administered medications for this visit.   Allergies  Allergen Reactions   Ace Inhibitors Cough   Codeine Nausea And Vomiting   Flomax [Tamsulosin Hcl] Itching    Social History   Tobacco Use   Smoking status: Former    Current packs/day: 0.00    Average packs/day: 1 pack/day for 25.0 years (25.0 ttl pk-yrs)    Types: Cigarettes    Start date: 10/08/1971    Quit date: 10/07/1996    Years since quitting: 27.1   Smokeless tobacco: Never  Substance Use Topics   Alcohol  use: Never    Family History  Problem Relation Age of Onset   Breast cancer Mother        55   Renal Disease Mother    Heart disease Mother    Diabetes Paternal Grandmother    Diabetes Maternal Aunt      Review of Systems  Musculoskeletal:  Positive for arthralgias and gait problem.  All other systems reviewed and are negative.   Objective:  Physical Exam Constitutional:  Appearance: Normal appearance.  HENT:     Head: Normocephalic and atraumatic.     Nose: Nose normal.     Mouth/Throat:     Mouth: Mucous membranes are moist.     Pharynx: Oropharynx is clear.  Eyes:     Conjunctiva/sclera: Conjunctivae normal.  Cardiovascular:     Rate and Rhythm: Normal rate and regular rhythm.     Pulses: Normal pulses.     Heart sounds: Normal heart sounds.  Pulmonary:     Effort: Pulmonary effort is normal.     Breath sounds: Normal breath sounds.  Abdominal:     General: Abdomen is flat.     Palpations: Abdomen is soft.  Genitourinary:    Comments: Deferred.  Musculoskeletal:     Cervical back: Normal range of motion and neck supple.     Comments: Examination of the right hip reveals no skin wounds or lesions. Mild trochanteric tenderness to palpation. She has severely restricted range of motion of the right hip. Pain with terminal flexion and rotation. Pain in the position of impingement. Positive  Stinchfield.  Distally, there is no focal motor or sensory deficit. She has subjective sensory change consistent with diabetic neuropathy. She has palpable pedal pulses.  She ambulates with an antalgic gait.  Skin:    General: Skin is warm and dry.     Capillary Refill: Capillary refill takes less than 2 seconds.  Neurological:     General: No focal deficit present.     Mental Status: She is alert and oriented to person, place, and time.  Psychiatric:        Mood and Affect: Mood normal.        Behavior: Behavior normal.        Thought Content: Thought content normal.        Judgment: Judgment normal.     Vital signs in last 24 hours: @VSRANGES @  Labs:   Estimated body mass index is 32.28 kg/m as calculated from the following:   Height as of 11/25/23: 5' 6 (1.676 m).   Weight as of 11/25/23: 90.7 kg.   Imaging Review Plain radiographs demonstrate severe degenerative joint disease of the right hip(s). The bone quality appears to be adequate for age and reported activity level.      Assessment/Plan:  End stage arthritis, right hip(s)  The patient history, physical examination, clinical judgement of the provider and imaging studies are consistent with end stage degenerative joint disease of the right hip(s) and total hip arthroplasty is deemed medically necessary. The treatment options including medical management, injection therapy, arthroscopy and arthroplasty were discussed at length. The risks and benefits of total hip arthroplasty were presented and reviewed. The risks due to aseptic loosening, infection, stiffness, dislocation/subluxation,  thromboembolic complications and other imponderables were discussed.  The patient acknowledged the explanation, agreed to proceed with the plan and consent was signed. Patient is being admitted for inpatient treatment for surgery, pain control, PT, OT, prophylactic antibiotics, VTE prophylaxis, progressive ambulation and ADL's and  discharge planning.The patient is planning to be discharged home with HEP after an overnight stay.   Therapy Plans: HEP.  Disposition: Home with son.  Planned DVT Prophylaxis: aspirin  81mg  BID.  DME needed: walker. No rollator after surgery.  PCP: Cleared TXA: IV Allergies:  - ACE inhibitors - cough - Codeine - vomiting - Tamsulosin - hives Anesthesia Concerns: None.  BMI: 33.4 Last HgbA1c: 5.9 Other: - T2DM. Diabetic neuropathy. - OSA, doesn't use CPAP.  - Right  hip IA injection 12/01/22.  - Nystatin  powder for pannicular fold and groin. Clear today. patient aware has to be clear in order to proceed with surgery.  - History of gastric bypass surgery. NO NSAIDs.  - Patient does not want any narcotic medication after surgery. Will try hydrocodone  in hospital. She only wants tylenol , zofran . We will see how she does.  - 11/24/22: Hgb 14.0, Cr. 1.08.    Patient's anticipated LOS is less than 2 midnights, meeting these requirements: - Younger than 52 - Lives within 1 hour of care - Has a competent adult at home to recover with post-op recover - NO history of  - Chronic pain requiring opiods  - Diabetes  - Coronary Artery Disease  - Heart failure  - Heart attack  - Stroke  - DVT/VTE  - Cardiac arrhythmia  - Respiratory Failure/COPD  - Renal failure  - Anemia  - Advanced Liver disease

## 2023-11-30 NOTE — Progress Notes (Signed)
 Patient aware to arrive at 0900 am for surgery tomorrow

## 2023-12-01 ENCOUNTER — Ambulatory Visit (HOSPITAL_COMMUNITY): Payer: Medicare Other | Admitting: Anesthesiology

## 2023-12-01 ENCOUNTER — Encounter (HOSPITAL_COMMUNITY): Payer: Self-pay | Admitting: Orthopedic Surgery

## 2023-12-01 ENCOUNTER — Observation Stay (HOSPITAL_COMMUNITY)
Admission: RE | Admit: 2023-12-01 | Discharge: 2023-12-02 | Disposition: A | Discharge status: Home / Self Care | Payer: Medicare Other | Source: Ambulatory Visit | Attending: Orthopedic Surgery | Admitting: Orthopedic Surgery

## 2023-12-01 ENCOUNTER — Other Ambulatory Visit: Payer: Self-pay

## 2023-12-01 ENCOUNTER — Observation Stay (HOSPITAL_COMMUNITY): Payer: Medicare Other

## 2023-12-01 ENCOUNTER — Encounter (HOSPITAL_COMMUNITY)
Admission: RE | Disposition: A | Discharge status: Home / Self Care | Payer: Self-pay | Source: Ambulatory Visit | Attending: Orthopedic Surgery

## 2023-12-01 ENCOUNTER — Ambulatory Visit (HOSPITAL_COMMUNITY): Payer: Medicare Other

## 2023-12-01 DIAGNOSIS — Z7984 Long term (current) use of oral hypoglycemic drugs: Secondary | ICD-10-CM | POA: Diagnosis not present

## 2023-12-01 DIAGNOSIS — I10 Essential (primary) hypertension: Secondary | ICD-10-CM | POA: Diagnosis not present

## 2023-12-01 DIAGNOSIS — E119 Type 2 diabetes mellitus without complications: Secondary | ICD-10-CM | POA: Insufficient documentation

## 2023-12-01 DIAGNOSIS — Z96641 Presence of right artificial hip joint: Principal | ICD-10-CM

## 2023-12-01 DIAGNOSIS — G4733 Obstructive sleep apnea (adult) (pediatric): Secondary | ICD-10-CM | POA: Diagnosis not present

## 2023-12-01 DIAGNOSIS — Z79899 Other long term (current) drug therapy: Secondary | ICD-10-CM | POA: Diagnosis not present

## 2023-12-01 DIAGNOSIS — M1611 Unilateral primary osteoarthritis, right hip: Secondary | ICD-10-CM | POA: Diagnosis not present

## 2023-12-01 DIAGNOSIS — Z471 Aftercare following joint replacement surgery: Secondary | ICD-10-CM | POA: Diagnosis not present

## 2023-12-01 DIAGNOSIS — Z01818 Encounter for other preprocedural examination: Secondary | ICD-10-CM

## 2023-12-01 DIAGNOSIS — G4730 Sleep apnea, unspecified: Secondary | ICD-10-CM | POA: Diagnosis not present

## 2023-12-01 DIAGNOSIS — Z87891 Personal history of nicotine dependence: Secondary | ICD-10-CM | POA: Diagnosis not present

## 2023-12-01 DIAGNOSIS — E1142 Type 2 diabetes mellitus with diabetic polyneuropathy: Secondary | ICD-10-CM

## 2023-12-01 HISTORY — PX: TOTAL HIP ARTHROPLASTY: SHX124

## 2023-12-01 LAB — GLUCOSE, CAPILLARY
Glucose-Capillary: 135 mg/dL — ABNORMAL HIGH (ref 70–99)
Glucose-Capillary: 111 mg/dL — ABNORMAL HIGH (ref 70–99)
Glucose-Capillary: 367 mg/dL — ABNORMAL HIGH (ref 70–99)

## 2023-12-01 LAB — TYPE AND SCREEN
ABO/RH(D): A POS
Antibody Screen: NEGATIVE

## 2023-12-01 LAB — ABO/RH: ABO/RH(D): A POS

## 2023-12-01 SURGERY — ARTHROPLASTY, HIP, TOTAL, ANTERIOR APPROACH
Anesthesia: Monitor Anesthesia Care | Site: Hip | Laterality: Right

## 2023-12-01 MED ORDER — METHOCARBAMOL 500 MG PO TABS: 500.0000 mg | ORAL_TABLET | Freq: Four times a day (QID) | ORAL | Status: DC | PRN

## 2023-12-01 MED ORDER — SODIUM CHLORIDE 0.9 % IR SOLN
Status: DC | PRN
  Administered 2023-12-01: 1000 mL
  Administered 2023-12-01: 3000 mL

## 2023-12-01 MED ORDER — ONDANSETRON HCL 4 MG/2ML IJ SOLN
INTRAMUSCULAR | Status: AC
  Filled 2023-12-01: qty 2

## 2023-12-01 MED ORDER — KETOROLAC TROMETHAMINE 30 MG/ML IJ SOLN
INTRAMUSCULAR | Status: DC | PRN
  Administered 2023-12-01: 30 mg

## 2023-12-01 MED ORDER — METOCLOPRAMIDE HCL 5 MG/ML IJ SOLN
5.0000 mg | Freq: Three times a day (TID) | INTRAMUSCULAR | Status: DC | PRN
Start: 2023-12-01 — End: 2023-12-02

## 2023-12-01 MED ORDER — BUPIVACAINE-EPINEPHRINE 0.25% -1:200000 IJ SOLN
INTRAMUSCULAR | Status: DC | PRN
  Administered 2023-12-01: 30 mL

## 2023-12-01 MED ORDER — POTASSIUM CHLORIDE CRYS ER 10 MEQ PO TBCR
10.0000 meq | EXTENDED_RELEASE_TABLET | Freq: Every day | ORAL | Status: DC
  Administered 2023-12-02: 10 meq via ORAL
  Filled 2023-12-01: qty 1

## 2023-12-01 MED ORDER — ONDANSETRON HCL 4 MG PO TABS: 4.0000 mg | ORAL_TABLET | Freq: Three times a day (TID) | ORAL | Status: DC | PRN

## 2023-12-01 MED ORDER — MENTHOL 3 MG MT LOZG: 1.0000 | LOZENGE | OROMUCOSAL | Status: DC | PRN

## 2023-12-01 MED ORDER — KETOROLAC TROMETHAMINE 30 MG/ML IJ SOLN
INTRAMUSCULAR | Status: AC
  Filled 2023-12-01: qty 1

## 2023-12-01 MED ORDER — ACETAMINOPHEN 500 MG PO TABS
500.0000 mg | ORAL_TABLET | Freq: Four times a day (QID) | ORAL | Status: DC
  Administered 2023-12-01 – 2023-12-02 (×2): 500 mg via ORAL
  Filled 2023-12-01 (×2): qty 1

## 2023-12-01 MED ORDER — POVIDONE-IODINE 10 % EX SWAB: 2.0000 | Freq: Once | CUTANEOUS | Status: DC

## 2023-12-01 MED ORDER — TRANEXAMIC ACID-NACL 1000-0.7 MG/100ML-% IV SOLN
1000.0000 mg | INTRAVENOUS | Status: AC
  Administered 2023-12-01: 1000 mg via INTRAVENOUS
  Filled 2023-12-01: qty 100

## 2023-12-01 MED ORDER — MIDAZOLAM HCL 5 MG/5ML IJ SOLN
INTRAMUSCULAR | Status: DC | PRN
  Administered 2023-12-01: 1 mg via INTRAVENOUS

## 2023-12-01 MED ORDER — PROPOFOL 10 MG/ML IV BOLUS
INTRAVENOUS | Status: DC | PRN
  Administered 2023-12-01: 20 mg via INTRAVENOUS
  Administered 2023-12-01: 10 mg via INTRAVENOUS
  Administered 2023-12-01: 30 mg via INTRAVENOUS
  Administered 2023-12-01: 10 mg via INTRAVENOUS
  Administered 2023-12-01: 30 mg via INTRAVENOUS

## 2023-12-01 MED ORDER — EZETIMIBE 10 MG PO TABS: 10.0000 mg | ORAL_TABLET | Freq: Every day | ORAL | Status: DC

## 2023-12-01 MED ORDER — ACETAMINOPHEN 160 MG/5ML PO SOLN: 325.0000 mg | ORAL | Status: DC | PRN

## 2023-12-01 MED ORDER — HYDROCODONE-ACETAMINOPHEN 5-325 MG PO TABS
1.0000 | ORAL_TABLET | ORAL | Status: DC | PRN
  Administered 2023-12-02 (×3): 2 via ORAL
  Filled 2023-12-01 (×3): qty 2

## 2023-12-01 MED ORDER — FENTANYL CITRATE PF 50 MCG/ML IJ SOSY: 25.0000 ug | PREFILLED_SYRINGE | INTRAMUSCULAR | Status: DC | PRN

## 2023-12-01 MED ORDER — ACETAMINOPHEN 325 MG PO TABS: 325.0000 mg | ORAL_TABLET | ORAL | Status: DC | PRN

## 2023-12-01 MED ORDER — LIDOCAINE HCL (PF) 2 % IJ SOLN
INTRAMUSCULAR | Status: AC
  Filled 2023-12-01: qty 5

## 2023-12-01 MED ORDER — DEXAMETHASONE SODIUM PHOSPHATE 10 MG/ML IJ SOLN
INTRAMUSCULAR | Status: AC
  Filled 2023-12-01: qty 1

## 2023-12-01 MED ORDER — ALUM & MAG HYDROXIDE-SIMETH 200-200-20 MG/5ML PO SUSP
30.0000 mL | ORAL | Status: DC | PRN
Start: 2023-12-01 — End: 2023-12-02

## 2023-12-01 MED ORDER — DULOXETINE HCL 60 MG PO CPEP
60.0000 mg | ORAL_CAPSULE | Freq: Every day | ORAL | Status: DC
  Administered 2023-12-02: 60 mg via ORAL
  Filled 2023-12-01: qty 1

## 2023-12-01 MED ORDER — CEFAZOLIN SODIUM-DEXTROSE 2-4 GM/100ML-% IV SOLN
2.0000 g | INTRAVENOUS | Status: AC
  Administered 2023-12-01: 2 g via INTRAVENOUS
  Filled 2023-12-01: qty 100

## 2023-12-01 MED ORDER — SODIUM CHLORIDE (PF) 0.9 % IJ SOLN
INTRAMUSCULAR | Status: DC | PRN
  Administered 2023-12-01: 30 mL

## 2023-12-01 MED ORDER — PHENYLEPHRINE HCL (PRESSORS) 10 MG/ML IV SOLN
INTRAVENOUS | Status: DC | PRN
Start: 2023-12-01 — End: 2023-12-01
  Administered 2023-12-01: 80 ug via INTRAVENOUS

## 2023-12-01 MED ORDER — MORPHINE SULFATE (PF) 2 MG/ML IV SOLN
0.5000 mg | INTRAVENOUS | Status: DC | PRN
Start: 2023-12-01 — End: 2023-12-02

## 2023-12-01 MED ORDER — ORAL CARE MOUTH RINSE: 15.0000 mL | Freq: Once | OROMUCOSAL | Status: AC

## 2023-12-01 MED ORDER — MIDAZOLAM HCL 2 MG/2ML IJ SOLN
INTRAMUSCULAR | Status: AC
  Filled 2023-12-01: qty 2

## 2023-12-01 MED ORDER — GABAPENTIN 400 MG PO CAPS
1600.0000 mg | ORAL_CAPSULE | Freq: Three times a day (TID) | ORAL | Status: DC
  Administered 2023-12-01 – 2023-12-02 (×3): 1600 mg via ORAL
  Filled 2023-12-01 (×5): qty 4

## 2023-12-01 MED ORDER — METOCLOPRAMIDE HCL 5 MG PO TABS: 5.0000 mg | ORAL_TABLET | Freq: Three times a day (TID) | ORAL | Status: DC | PRN

## 2023-12-01 MED ORDER — LACTATED RINGERS IV SOLN: INTRAVENOUS | Status: DC

## 2023-12-01 MED ORDER — OXYCODONE HCL 5 MG PO TABS: 5.0000 mg | ORAL_TABLET | Freq: Once | ORAL | Status: DC | PRN

## 2023-12-01 MED ORDER — FERROUS SULFATE 325 (65 FE) MG PO TABS
325.0000 mg | ORAL_TABLET | Freq: Every evening | ORAL | Status: DC
  Administered 2023-12-01: 325 mg via ORAL
  Filled 2023-12-01: qty 1

## 2023-12-01 MED ORDER — PANTOPRAZOLE SODIUM 40 MG PO TBEC
40.0000 mg | DELAYED_RELEASE_TABLET | Freq: Every day | ORAL | Status: DC
Start: 2023-12-02 — End: 2023-12-02
  Administered 2023-12-02: 40 mg via ORAL
  Filled 2023-12-01: qty 1

## 2023-12-01 MED ORDER — SENNA 8.6 MG PO TABS
1.0000 | ORAL_TABLET | Freq: Two times a day (BID) | ORAL | Status: DC
  Administered 2023-12-01 – 2023-12-02 (×2): 8.6 mg via ORAL
  Filled 2023-12-01 (×2): qty 1

## 2023-12-01 MED ORDER — WATER FOR IRRIGATION, STERILE IR SOLN
Status: DC | PRN
  Administered 2023-12-01: 1000 mL

## 2023-12-01 MED ORDER — PROPOFOL 500 MG/50ML IV EMUL
INTRAVENOUS | Status: DC | PRN
  Administered 2023-12-01: 75 ug/kg/min via INTRAVENOUS

## 2023-12-01 MED ORDER — FENTANYL CITRATE (PF) 100 MCG/2ML IJ SOLN
INTRAMUSCULAR | Status: AC
  Filled 2023-12-01: qty 2

## 2023-12-01 MED ORDER — INSULIN ASPART 100 UNIT/ML IJ SOLN
0.0000 [IU] | Freq: Every day | INTRAMUSCULAR | Status: DC
  Administered 2023-12-01: 5 [IU] via SUBCUTANEOUS

## 2023-12-01 MED ORDER — DOCUSATE SODIUM 100 MG PO CAPS
100.0000 mg | ORAL_CAPSULE | Freq: Two times a day (BID) | ORAL | Status: DC
  Administered 2023-12-01 – 2023-12-02 (×2): 100 mg via ORAL
  Filled 2023-12-01 (×2): qty 1

## 2023-12-01 MED ORDER — PHENOL 1.4 % MT LIQD: 1.0000 | OROMUCOSAL | Status: DC | PRN

## 2023-12-01 MED ORDER — DOXEPIN HCL 25 MG PO CAPS: 25.0000 mg | ORAL_CAPSULE | Freq: Every evening | ORAL | Status: DC | PRN

## 2023-12-01 MED ORDER — DILTIAZEM HCL ER COATED BEADS 120 MG PO CP24
120.0000 mg | ORAL_CAPSULE | Freq: Every evening | ORAL | Status: DC
  Administered 2023-12-01: 120 mg via ORAL
  Filled 2023-12-01: qty 1

## 2023-12-01 MED ORDER — INSULIN ASPART 100 UNIT/ML IJ SOLN: 0.0000 [IU] | INTRAMUSCULAR | Status: DC | PRN

## 2023-12-01 MED ORDER — NYSTATIN 100000 UNIT/GM EX POWD: 1.0000 | Freq: Three times a day (TID) | CUTANEOUS | Status: DC | PRN

## 2023-12-01 MED ORDER — ACETAMINOPHEN 500 MG PO TABS
1000.0000 mg | ORAL_TABLET | Freq: Once | ORAL | Status: AC
  Administered 2023-12-01: 1000 mg via ORAL
  Filled 2023-12-01: qty 2

## 2023-12-01 MED ORDER — ISOPROPYL ALCOHOL 70 % SOLN
Status: DC | PRN
  Administered 2023-12-01: 1 via TOPICAL

## 2023-12-01 MED ORDER — INSULIN ASPART 100 UNIT/ML IJ SOLN
0.0000 [IU] | Freq: Three times a day (TID) | INTRAMUSCULAR | Status: DC
  Administered 2023-12-02: 3 [IU] via SUBCUTANEOUS
  Administered 2023-12-02: 2 [IU] via SUBCUTANEOUS

## 2023-12-01 MED ORDER — CEFAZOLIN SODIUM-DEXTROSE 2-4 GM/100ML-% IV SOLN
2.0000 g | Freq: Four times a day (QID) | INTRAVENOUS | Status: AC
  Administered 2023-12-01 – 2023-12-02 (×2): 2 g via INTRAVENOUS
  Filled 2023-12-01 (×2): qty 100

## 2023-12-01 MED ORDER — DIPHENHYDRAMINE HCL 12.5 MG/5ML PO ELIX: 12.5000 mg | ORAL_SOLUTION | ORAL | Status: DC | PRN

## 2023-12-01 MED ORDER — POLYETHYLENE GLYCOL 3350 17 G PO PACK
17.0000 g | PACK | Freq: Every day | ORAL | Status: DC | PRN
Start: 2023-12-01 — End: 2023-12-02

## 2023-12-01 MED ORDER — ONDANSETRON HCL 4 MG/2ML IJ SOLN
INTRAMUSCULAR | Status: DC | PRN
  Administered 2023-12-01: 4 mg via INTRAVENOUS

## 2023-12-01 MED ORDER — OXYCODONE HCL 5 MG/5ML PO SOLN: 5.0000 mg | Freq: Once | ORAL | Status: DC | PRN

## 2023-12-01 MED ORDER — PROPOFOL 1000 MG/100ML IV EMUL
INTRAVENOUS | Status: AC
  Filled 2023-12-01: qty 100

## 2023-12-01 MED ORDER — ONDANSETRON HCL 4 MG/2ML IJ SOLN: 4.0000 mg | Freq: Once | INTRAMUSCULAR | Status: DC | PRN

## 2023-12-01 MED ORDER — BISACODYL 10 MG RE SUPP: 10.0000 mg | Freq: Every day | RECTAL | Status: DC | PRN

## 2023-12-01 MED ORDER — DEXAMETHASONE SODIUM PHOSPHATE 4 MG/ML IJ SOLN
INTRAMUSCULAR | Status: DC | PRN
  Administered 2023-12-01: 10 mg via INTRAVENOUS

## 2023-12-01 MED ORDER — CHLORHEXIDINE GLUCONATE 0.12 % MT SOLN
15.0000 mL | Freq: Once | OROMUCOSAL | Status: AC
  Administered 2023-12-01: 15 mL via OROMUCOSAL

## 2023-12-01 MED ORDER — SODIUM CHLORIDE (PF) 0.9 % IJ SOLN
INTRAMUSCULAR | Status: AC
  Filled 2023-12-01: qty 50

## 2023-12-01 MED ORDER — HYDROCODONE-ACETAMINOPHEN 7.5-325 MG PO TABS
1.0000 | ORAL_TABLET | ORAL | Status: DC | PRN
  Administered 2023-12-01: 1 via ORAL
  Filled 2023-12-01: qty 1

## 2023-12-01 MED ORDER — METHOCARBAMOL 1000 MG/10ML IJ SOLN: 500.0000 mg | Freq: Four times a day (QID) | INTRAMUSCULAR | Status: DC | PRN

## 2023-12-01 MED ORDER — BUPIVACAINE-EPINEPHRINE 0.25% -1:200000 IJ SOLN
INTRAMUSCULAR | Status: AC
  Filled 2023-12-01: qty 1

## 2023-12-01 MED ORDER — ASPIRIN 81 MG PO CHEW
81.0000 mg | CHEWABLE_TABLET | Freq: Two times a day (BID) | ORAL | Status: DC
  Administered 2023-12-01 – 2023-12-02 (×2): 81 mg via ORAL
  Filled 2023-12-01 (×2): qty 1

## 2023-12-01 MED ORDER — ACETAMINOPHEN 325 MG PO TABS: 325.0000 mg | ORAL_TABLET | Freq: Four times a day (QID) | ORAL | Status: DC | PRN

## 2023-12-01 MED ORDER — MEPERIDINE HCL 50 MG/ML IJ SOLN: 6.2500 mg | INTRAMUSCULAR | Status: DC | PRN

## 2023-12-01 MED ORDER — SODIUM CHLORIDE 0.9 % IV SOLN: INTRAVENOUS | Status: DC

## 2023-12-01 SURGICAL SUPPLY — 48 items
ACE SHELL 3H 52 E HIP (Shell) ×1 IMPLANT
BAG COUNTER SPONGE SURGICOUNT (BAG) IMPLANT
BAG ZIPLOCK 12X15 (MISCELLANEOUS) IMPLANT
CHLORAPREP W/TINT 26 (MISCELLANEOUS) ×1 IMPLANT
COVER PERINEAL POST (MISCELLANEOUS) ×1 IMPLANT
COVER SURGICAL LIGHT HANDLE (MISCELLANEOUS) ×1 IMPLANT
DERMABOND ADVANCED .7 DNX12 (GAUZE/BANDAGES/DRESSINGS) ×2 IMPLANT
DRAPE IMP U-DRAPE 54X76 (DRAPES) ×1 IMPLANT
DRAPE SHEET LG 3/4 BI-LAMINATE (DRAPES) ×3 IMPLANT
DRAPE STERI IOBAN 125X83 (DRAPES) ×1 IMPLANT
DRAPE U-SHAPE 47X51 STRL (DRAPES) ×1 IMPLANT
DRSG AQUACEL AG ADV 3.5X10 (GAUZE/BANDAGES/DRESSINGS) ×1 IMPLANT
ELECT REM PT RETURN 15FT ADLT (MISCELLANEOUS) ×1 IMPLANT
GAUZE SPONGE 4X4 12PLY STRL (GAUZE/BANDAGES/DRESSINGS) ×1 IMPLANT
GLOVE BIO SURGEON STRL SZ7 (GLOVE) ×1 IMPLANT
GLOVE BIO SURGEON STRL SZ8.5 (GLOVE) ×2 IMPLANT
GLOVE BIOGEL PI IND STRL 7.5 (GLOVE) ×1 IMPLANT
GLOVE BIOGEL PI IND STRL 8.5 (GLOVE) ×1 IMPLANT
GOWN SPEC L3 XXLG W/TWL (GOWN DISPOSABLE) ×1 IMPLANT
GOWN STRL REUS W/ TWL XL LVL3 (GOWN DISPOSABLE) ×1 IMPLANT
HEAD CERAMIC BIOLOX 36 (Head) IMPLANT
HOLDER FOLEY CATH W/STRAP (MISCELLANEOUS) ×1 IMPLANT
HOOD PEEL AWAY T7 (MISCELLANEOUS) ×3 IMPLANT
KIT TURNOVER KIT A (KITS) IMPLANT
LINER ACE G7 HIGH 36 SZ E (Liner) IMPLANT
MANIFOLD NEPTUNE II (INSTRUMENTS) ×1 IMPLANT
MARKER SKIN DUAL TIP RULER LAB (MISCELLANEOUS) ×1 IMPLANT
NDL SAFETY ECLIPSE 18X1.5 (NEEDLE) ×1 IMPLANT
NDL SPNL 18GX3.5 QUINCKE PK (NEEDLE) ×1 IMPLANT
NEEDLE SPNL 18GX3.5 QUINCKE PK (NEEDLE) ×1
PACK ANTERIOR HIP CUSTOM (KITS) ×1 IMPLANT
SAW OSC TIP CART 19.5X105X1.3 (SAW) ×1 IMPLANT
SEALER BIPOLAR AQUA 6.0 (INSTRUMENTS) ×1 IMPLANT
SET HNDPC FAN SPRY TIP SCT (DISPOSABLE) ×1 IMPLANT
SHELL ACETAB 3H 52 E HIP (Shell) IMPLANT
SOLUTION PRONTOSAN WOUND 350ML (IRRIGATION / IRRIGATOR) ×1 IMPLANT
SPIKE FLUID TRANSFER (MISCELLANEOUS) ×1 IMPLANT
STEM FEM CMTLS 15X115 133D (Stem) IMPLANT
SUT MNCRL AB 3-0 PS2 18 (SUTURE) ×1 IMPLANT
SUT MON AB 2-0 CT1 36 (SUTURE) ×1 IMPLANT
SUT STRATAFIX 14 PDO 48 VLT (SUTURE) ×1 IMPLANT
SUT STRATAFIX PDO 1 14 VIOLET (SUTURE) ×1
SUT VIC AB 2-0 CT1 TAPERPNT 27 (SUTURE) IMPLANT
SYR 3ML LL SCALE MARK (SYRINGE) ×1 IMPLANT
TOWEL GREEN STERILE FF (TOWEL DISPOSABLE) ×1 IMPLANT
TRAY FOLEY MTR SLVR 16FR STAT (SET/KITS/TRAYS/PACK) IMPLANT
TUBE SUCTION HIGH CAP CLEAR NV (SUCTIONS) ×1 IMPLANT
WATER STERILE IRR 1000ML POUR (IV SOLUTION) ×1 IMPLANT

## 2023-12-01 NOTE — Anesthesia Postprocedure Evaluation (Signed)
 Anesthesia Post Note  Patient: Marilyn Myers  Procedure(s) Performed: TOTAL HIP ARTHROPLASTY ANTERIOR APPROACH (Right: Hip)     Patient location during evaluation: PACU Anesthesia Type: MAC Level of consciousness: awake and alert Pain management: pain level controlled Vital Signs Assessment: post-procedure vital signs reviewed and stable Respiratory status: spontaneous breathing, nonlabored ventilation, respiratory function stable and patient connected to nasal cannula oxygen Cardiovascular status: stable and blood pressure returned to baseline Postop Assessment: no apparent nausea or vomiting Anesthetic complications: no   No notable events documented.  Last Vitals:  Vitals:   12/01/23 1630 12/01/23 1728  BP: 119/75 127/82  Pulse: 67 88  Resp: 15 17  Temp:  36.5 C  SpO2: 100% 99%    Last Pain:  Vitals:   12/01/23 1731  TempSrc:   PainSc: 0-No pain                 Nayab Aten

## 2023-12-01 NOTE — Interval H&P Note (Signed)
 History and Physical Interval Note:  12/01/2023 9:42 AM  Marilyn Myers  has presented today for surgery, with the diagnosis of right hip osteoarthritis.  The various methods of treatment have been discussed with the patient and family. After consideration of risks, benefits and other options for treatment, the patient has consented to  Procedure(s) with comments: TOTAL HIP ARTHROPLASTY ANTERIOR APPROACH (Right) - 130 as a surgical intervention.  The patient's history has been reviewed, patient examined, no change in status, stable for surgery.  I have reviewed the patient's chart and labs.  Questions were answered to the patient's satisfaction.     Marilyn Myers

## 2023-12-01 NOTE — Transfer of Care (Signed)
 Immediate Anesthesia Transfer of Care Note  Patient: Marilyn Myers  Procedure(s) Performed: TOTAL HIP ARTHROPLASTY ANTERIOR APPROACH (Right: Hip)  Patient Location: PACU  Anesthesia Type:General  Level of Consciousness: awake and alert   Airway & Oxygen Therapy: Patient Spontanous Breathing and Patient connected to nasal cannula oxygen  Post-op Assessment: Report given to RN and Post -op Vital signs reviewed and stable  Post vital signs: Reviewed and stable  Last Vitals:  Vitals Value Taken Time  BP    Temp    Pulse 67 12/01/23 1513  Resp 19 12/01/23 1513  SpO2 95 % 12/01/23 1513  Vitals shown include unfiled device data.  Last Pain:  Vitals:   12/01/23 1021  TempSrc: Oral  PainSc:          Complications: No notable events documented.

## 2023-12-01 NOTE — Op Note (Signed)
 OPERATIVE REPORT  SURGEON: Redell Shoals, MD   ASSISTANT: Valery Potters, PA-C.  PREOPERATIVE DIAGNOSIS: Right hip arthritis.   POSTOPERATIVE DIAGNOSIS: Right hip arthritis.   PROCEDURE: Right total hip arthroplasty, anterior approach.   IMPLANTS: Biomet Taperloc Complete Microplasty stem, size 15 x 115 mm, high offset. Biomet G7 OsseoTi Cup, size 52 mm. Biomet Vivacit-E liner, size 36 mm, E, neutral. Biomet Biolox ceramic head ball, size 36 - 3 mm.  ANESTHESIA:  MAC and Spinal  ESTIMATED BLOOD LOSS:-25 mL    ANTIBIOTICS: 2g Ancef .  DRAINS: None.  COMPLICATIONS: None.   CONDITION: PACU - hemodynamically stable.   BRIEF CLINICAL NOTE: Marilyn Myers is a 73 y.o. female with a long-standing history of Right hip arthritis. After failing conservative management, the patient was indicated for total hip arthroplasty. The risks, benefits, and alternatives to the procedure were explained, and the patient elected to proceed.  PROCEDURE IN DETAIL: Surgical site was marked by myself in the pre-op holding area. Once inside the operating room, spinal anesthesia was obtained, and a foley catheter was inserted. The patient was then positioned on the Hana table.  All bony prominences were well padded.  The hip was prepped and draped in the normal sterile surgical fashion.  A time-out was called verifying side and site of surgery. The patient received IV antibiotics within 60 minutes of beginning the procedure.   Bikini incision was made, and superficial dissection was performed lateral to the ASIS. The direct anterior approach to the hip was performed through the Hueter interval.  Lateral femoral circumflex vessels were treated with the Auqumantys. The anterior capsule was exposed and an inverted T capsulotomy was made. The femoral neck cut was made to the level of the templated cut.  A corkscrew was placed into the head and the head was removed.  The femoral head was found to have eburnated  bone. The head was passed to the back table and was measured. Pubofemoral ligament was released off of the calcar, taking care to stay on bone. Superior capsule was released from the greater trochanter, taking care to stay lateral to the posterior border of the femoral neck in order to preserve the short external rotators.   Acetabular exposure was achieved, and the pulvinar and labrum were excised. Sequential reaming of the acetabulum was then performed up to a size 51 mm reamer. A 52 mm cup was then opened and impacted into place at approximately 40 degrees of abduction and 20 degrees of anteversion. The final polyethylene liner was impacted into place and acetabular osteophytes were removed.    I then gained femoral exposure taking care to protect the abductors and greater trochanter.  This was performed using standard external rotation, extension, and adduction.  A cookie cutter was used to enter the femoral canal, and then the femoral canal finder was placed.  Sequential broaching was performed up to a size 15.  Calcar planer was used on the femoral neck remnant.  I placed a high offset neck and a trial head ball.  The hip was reduced.  Leg lengths and offset were checked fluoroscopically.  The hip was dislocated and trial components were removed.  The final implants were placed, and the hip was reduced.  Fluoroscopy was used to confirm component position and leg lengths.  At 90 degrees of external rotation and full extension, the hip was stable to an anterior directed force.   The wound was copiously irrigated with Prontosan solution and normal saline using pulse lavage.  Marcaine  solution was injected into the periarticular soft tissue.  The wound was closed in layers using #1 Vicryl and V-Loc for the fascia, 2-0 Vicryl for the subcutaneous fat, 2-0 Monocryl for the deep dermal layer, 3-0 running Monocryl subcuticular stitch, and Dermabond for the skin.  Once the glue was fully dried, an Aquacell Ag  dressing was applied.  The patient was transported to the recovery room in stable condition.  Sponge, needle, and instrument counts were correct at the end of the case x2.  The patient tolerated the procedure well and there were no known complications.  The aquamantis was utilized for this case to help facilitate better hemostasis as patient was felt to be at increased risk of bleeding because of complex case requiring increased OR time and/or exposure.  A oscillating saw tip was utilized for this case to prevent damage to the soft tissue structures such as muscles, ligaments and tendons, and to ensure accurate bone cuts. This patient was at increased risk for above structures due to  minimally invasive approach.  Please note that a surgical assistant was a medical necessity for this procedure to perform it in a safe and expeditious manner. Assistant was necessary to provide appropriate retraction of vital neurovascular structures, to prevent femoral fracture, and to allow for anatomic placement of the prosthesis.

## 2023-12-01 NOTE — Anesthesia Preprocedure Evaluation (Signed)
 Anesthesia Evaluation  Patient identified by MRN, date of birth, ID band Patient awake    Reviewed: Allergy & Precautions, NPO status , Patient's Chart, lab work & pertinent test results  Airway Mallampati: II  TM Distance: >3 FB Neck ROM: Full    Dental no notable dental hx.    Pulmonary sleep apnea , former smoker   Pulmonary exam normal breath sounds clear to auscultation       Cardiovascular hypertension, Pt. on medications Normal cardiovascular exam+ dysrhythmias  Rhythm:Regular Rate:Normal     Neuro/Psych negative neurological ROS  negative psych ROS   GI/Hepatic Neg liver ROS,GERD  Medicated,,  Endo/Other  diabetes, Type 2  Class 3 obesity  Renal/GU Renal disease  negative genitourinary   Musculoskeletal  (+) Arthritis , Osteoarthritis,    Abdominal   Peds negative pediatric ROS (+)  Hematology negative hematology ROS (+)   Anesthesia Other Findings   Reproductive/Obstetrics negative OB ROS                             Anesthesia Physical Anesthesia Plan  ASA: 3  Anesthesia Plan: MAC and Spinal   Post-op Pain Management: Minimal or no pain anticipated   Induction: Intravenous  PONV Risk Score and Plan: 3 and Ondansetron , Dexamethasone  and Treatment may vary due to age or medical condition  Airway Management Planned: Natural Airway, Simple Face Mask and Mask  Additional Equipment: None  Intra-op Plan:   Post-operative Plan:   Informed Consent: I have reviewed the patients History and Physical, chart, labs and discussed the procedure including the risks, benefits and alternatives for the proposed anesthesia with the patient or authorized representative who has indicated his/her understanding and acceptance.     Dental advisory given  Plan Discussed with: CRNA, Surgeon and Anesthesiologist  Anesthesia Plan Comments:         Anesthesia Quick Evaluation

## 2023-12-01 NOTE — Plan of Care (Signed)
  Problem: Education: Goal: Knowledge of General Education information will improve Description: Including pain rating scale, medication(s)/side effects and non-pharmacologic comfort measures Outcome: Progressing   Problem: Health Behavior/Discharge Planning: Goal: Ability to manage health-related needs will improve Outcome: Progressing   Problem: Nutrition: Goal: Adequate nutrition will be maintained Outcome: Progressing   Problem: Skin Integrity: Goal: Risk for impaired skin integrity will decrease Outcome: Progressing   Problem: Activity: Goal: Ability to tolerate increased activity will improve Outcome: Progressing   Problem: Clinical Measurements: Goal: Postoperative complications will be avoided or minimized Outcome: Progressing

## 2023-12-01 NOTE — Discharge Instructions (Signed)

## 2023-12-01 NOTE — Care Plan (Signed)
 Ortho Bundle Case Management Note  Patient Details  Name: Marilyn Myers MRN: [id number] Date of Birth: May 29, 1951  R THA on 12-01-23 DCP:  Home with son DME:  RW ordered through [employer] PT:  HEP                   DME Arranged:  Vannie rolling DME Agency:  [employer]  HH Arranged:  NA HH Agency:  NA  Additional Comments: Please contact me with any questions of if this plan should need to change.  Kate DELENA Kraft, RN,CCM [facility]  [phone] 12/01/2023, 12:06 PM

## 2023-12-02 ENCOUNTER — Encounter (HOSPITAL_COMMUNITY): Payer: Self-pay | Admitting: Orthopedic Surgery

## 2023-12-02 ENCOUNTER — Other Ambulatory Visit (HOSPITAL_COMMUNITY): Payer: Self-pay

## 2023-12-02 DIAGNOSIS — M1611 Unilateral primary osteoarthritis, right hip: Secondary | ICD-10-CM | POA: Diagnosis not present

## 2023-12-02 DIAGNOSIS — Z7984 Long term (current) use of oral hypoglycemic drugs: Secondary | ICD-10-CM | POA: Diagnosis not present

## 2023-12-02 DIAGNOSIS — I10 Essential (primary) hypertension: Secondary | ICD-10-CM | POA: Diagnosis not present

## 2023-12-02 DIAGNOSIS — Z79899 Other long term (current) drug therapy: Secondary | ICD-10-CM | POA: Diagnosis not present

## 2023-12-02 DIAGNOSIS — Z87891 Personal history of nicotine dependence: Secondary | ICD-10-CM | POA: Diagnosis not present

## 2023-12-02 DIAGNOSIS — E119 Type 2 diabetes mellitus without complications: Secondary | ICD-10-CM | POA: Diagnosis not present

## 2023-12-02 LAB — BASIC METABOLIC PANEL
Anion gap: 8 (ref 5–15)
Anion gap: 10 (ref 5–15)
BUN: 35 mg/dL — ABNORMAL HIGH (ref 8–23)
BUN: 34 mg/dL — ABNORMAL HIGH (ref 8–23)
CO2: 24 mmol/L (ref 22–32)
CO2: 23 mmol/L (ref 22–32)
Calcium: 8.4 mg/dL — ABNORMAL LOW (ref 8.9–10.3)
Calcium: 8.5 mg/dL — ABNORMAL LOW (ref 8.9–10.3)
Chloride: 104 mmol/L (ref 98–111)
Chloride: 102 mmol/L (ref 98–111)
Creatinine, Ser: 1.31 mg/dL — ABNORMAL HIGH (ref 0.44–1.00)
Creatinine, Ser: 1.21 mg/dL — ABNORMAL HIGH (ref 0.44–1.00)
GFR, Estimated: 43 mL/min — ABNORMAL LOW (ref >60)
GFR, Estimated: 47 mL/min — ABNORMAL LOW (ref >60)
Glucose, Bld: 263 mg/dL — ABNORMAL HIGH (ref 70–99)
Glucose, Bld: 176 mg/dL — ABNORMAL HIGH (ref 70–99)
Potassium: 3.3 mmol/L — ABNORMAL LOW (ref 3.5–5.1)
Potassium: 3.5 mmol/L (ref 3.5–5.1)
Sodium: 136 mmol/L (ref 135–145)
Sodium: 135 mmol/L (ref 135–145)

## 2023-12-02 LAB — CBC
HCT: 33.5 % — ABNORMAL LOW (ref 36.0–46.0)
Hemoglobin: 11.1 g/dL — ABNORMAL LOW (ref 12.0–15.0)
MCH: 31.2 pg (ref 26.0–34.0)
MCHC: 33.1 g/dL (ref 30.0–36.0)
MCV: 94.1 fL (ref 80.0–100.0)
Platelets: 231 10*3/uL (ref 150–400)
RBC: 3.56 MIL/uL — ABNORMAL LOW (ref 3.87–5.11)
RDW: 13 % (ref 11.5–15.5)
WBC: 14.5 10*3/uL — ABNORMAL HIGH (ref 4.0–10.5)
nRBC: 0 % (ref 0.0–0.2)

## 2023-12-02 LAB — GLUCOSE, CAPILLARY
Glucose-Capillary: 250 mg/dL — ABNORMAL HIGH (ref 70–99)
Glucose-Capillary: 160 mg/dL — ABNORMAL HIGH (ref 70–99)

## 2023-12-02 MED ORDER — ASPIRIN 81 MG PO CHEW
81.0000 mg | CHEWABLE_TABLET | Freq: Two times a day (BID) | ORAL | 0 refills | Status: AC
  Filled 2023-12-02: qty 90, 45d supply, fill #0

## 2023-12-02 MED ORDER — SENNA 8.6 MG PO TABS
2.0000 | ORAL_TABLET | Freq: Every day | ORAL | 0 refills | Status: AC
  Filled 2023-12-02: qty 30, 15d supply, fill #0

## 2023-12-02 MED ORDER — POLYETHYLENE GLYCOL 3350 17 GM/SCOOP PO POWD
17.0000 g | Freq: Every day | ORAL | 0 refills | Status: AC | PRN
  Filled 2023-12-02: qty 238, 14d supply, fill #0

## 2023-12-02 MED ORDER — HYDROCODONE-ACETAMINOPHEN 5-325 MG PO TABS
1.0000 | ORAL_TABLET | ORAL | 0 refills | Status: AC | PRN
  Filled 2023-12-02: qty 42, 7d supply, fill #0

## 2023-12-02 MED ORDER — DOCUSATE SODIUM 100 MG PO CAPS
100.0000 mg | ORAL_CAPSULE | Freq: Two times a day (BID) | ORAL | 0 refills | Status: AC
  Filled 2023-12-02: qty 60, 30d supply, fill #0

## 2023-12-02 MED ORDER — NYSTATIN 100000 UNIT/GM EX POWD
1.0000 | Freq: Three times a day (TID) | CUTANEOUS | 0 refills | Status: AC
  Filled 2023-12-02: qty 15, 5d supply, fill #0

## 2023-12-02 MED ORDER — ONDANSETRON HCL 4 MG PO TABS
4.0000 mg | ORAL_TABLET | Freq: Three times a day (TID) | ORAL | 0 refills | Status: AC | PRN
  Filled 2023-12-02: qty 30, 10d supply, fill #0

## 2023-12-02 NOTE — Plan of Care (Signed)
  Problem: Safety: Goal: Ability to remain free from injury will improve Outcome: Progressing   Problem: Education: Goal: Knowledge of the prescribed therapeutic regimen will improve Outcome: Progressing   Problem: Clinical Measurements: Goal: Postoperative complications will be avoided or minimized Outcome: Progressing   Problem: Pain Management: Goal: Pain level will decrease with appropriate interventions Outcome: Progressing

## 2023-12-02 NOTE — Progress Notes (Signed)
    Subjective:  Patient reports pain as mild to moderate.  Denies N/V/CP/SOB/Abd pain. She denies any tingling or numbness in LE bilaterally. She reports pain controlled with pain medication.   We discussed barriers for d/c home. She is eager fro d/c home.   Objective:   VITALS:   Vitals:   12/01/23 1728 12/01/23 1936 12/02/23 0057 12/02/23 0549  BP: 127/82 (!) 122/59 124/76 119/63  Pulse: 88 83 85 75  Resp: 17 18 16 18   Temp: 97.7 F (36.5 C) 98.2 F (36.8 C) (!) 97.5 F (36.4 C) 98 F (36.7 C)  TempSrc: Oral Oral Oral Oral  SpO2: 99% 97% 97% 95%  Weight:      Height:        NAD Neurologically intact ABD soft Neurovascular intact Sensation intact distally Intact pulses distally Dorsiflexion/Plantar flexion intact Incision: dressing C/D/I No cellulitis present Compartment soft   Lab Results  Component Value Date   WBC 14.5 (H) 12/02/2023   HGB 11.1 (L) 12/02/2023   HCT 33.5 (L) 12/02/2023   MCV 94.1 12/02/2023   PLT 231 12/02/2023   BMET    Component Value Date/Time   NA 136 12/02/2023 0327   NA 139 11/06/2018 0000   K 3.3 (L) 12/02/2023 0327   CL 104 12/02/2023 0327   CO2 24 12/02/2023 0327   GLUCOSE 263 (H) 12/02/2023 0327   BUN 35 (H) 12/02/2023 0327   BUN 18 11/06/2018 0000   CREATININE 1.31 (H) 12/02/2023 0327   CALCIUM  8.4 (L) 12/02/2023 0327   GFRNONAA 43 (L) 12/02/2023 0327     Assessment/Plan: 1 Day Post-Op   Principal Problem:   Osteoarthritis of right hip Active Problems:   S/P total right hip arthroplasty   WBAT with walker DVT ppx: Aspirin , SCDs, TEDS PO pain control PT/OT Dispo:  - ABLA hemoglobin 11.1. Continue to monitor.  - Chronic hypokalemia: K+ 3.3 this morning. She is on 10 mEq potassium at baseline. She reports she has not taken in 1 week. Will resume home dose this morning.  - Creatinine: 1.31 this morning (1.03 11/25/2023). Continue IV fluids will recheck BMP this afternoon. If downward trend can d/c home. -  Disposition pending PT clearance, voiding, afternoon BMP.    Valery GORMAN Potters, PA-C 12/02/2023, 7:33 AM   [facility]  [place] [address], [address], [place], [place] [address] Phone: [phone] [email or web address] Facebook  [employer]

## 2023-12-02 NOTE — Progress Notes (Signed)
 TOC discharge meds in a secure bag delivered to pt in room by this RN. Pennie Rushing, patient's nurse, updated on delivery.

## 2023-12-02 NOTE — Progress Notes (Signed)
 Physical Therapy Treatment Patient Details Name: Marilyn Myers MRN: [id number] DOB: 1951/08/31 Today's Date: 12/02/2023   History of Present Illness Pt s/p R THR and with hx of DM    PT Comments  Pt motivated and progressing well with mobility including up to ambulate in hall with noted improvement in stability, up to bathroom for toileting and hygiene at sink, reviewed car transfers and reviewed LB dressing.  Additional HEP deferred as pt states, Dr Fidel just told me to throw anything you gave me in the trash and just do standing hip abduction up to 300 a day.  Pt eager for dc home this date.    If plan is discharge home, recommend the following: A little help with walking and/or transfers;A little help with bathing/dressing/bathroom;Assistance with cooking/housework;Assist for transportation;Help with stairs or ramp for entrance   Can travel by private vehicle        Equipment Recommendations  Rolling walker (2 wheels)    Recommendations for Other Services OT consult     Precautions / Restrictions Precautions Precautions: Fall Restrictions Weight Bearing Restrictions Per Provider Order: No RLE Weight Bearing Per Provider Order: Weight bearing as tolerated     Mobility  Bed Mobility Overal bed mobility: Needs Assistance Bed Mobility: Supine to Sit     Supine to sit: Contact guard, Supervision     General bed mobility comments: up in chair and returns to same    Transfers Overall transfer level: Needs assistance Equipment used: Rolling walker (2 wheels) Transfers: Sit to/from Stand Sit to Stand: Contact guard assist, Supervision           General transfer comment: cues for LE management and use of UEs to self assist    Ambulation/Gait Ambulation/Gait assistance: Contact guard assist, Supervision Gait Distance (Feet): 150 Feet (and 15' back from bathroom) Assistive device: Rolling walker (2 wheels) Gait Pattern/deviations: Step-to pattern, Decreased step  length - right, Decreased step length - left, Shuffle, Trunk flexed Gait velocity: decr     General Gait Details: min cues for posture, sequence and position from RW   Stairs Stairs: Yes Stairs assistance: Min assist Stair Management: No rails, Step to pattern, Forwards, With walker Number of Stairs: 2 General stair comments: single step x 2 with RW and min cues for sequence   Wheelchair Mobility     Tilt Bed    Modified Rankin (Stroke Patients Only)       Balance Overall balance assessment: Mild deficits observed, not formally tested                                          Cognition Arousal: Alert Behavior During Therapy: WFL for tasks assessed/performed Overall Cognitive Status: Within Functional Limits for tasks assessed                                          Exercises Total Joint Exercises Ankle Circles/Pumps: AROM, Both, 15 reps, Supine Hip ABduction/ADduction: AROM, Standing, 20 reps, Right    General Comments        Pertinent Vitals/Pain Pain Assessment Pain Assessment: 0-10 Pain Score: 2  Pain Location: R hip Pain Descriptors / Indicators: Aching, Sore Pain Intervention(s): Limited activity within patient's tolerance, Monitored during session, Premedicated before session, Ice applied    Home Living  Family/patient expects to be discharged to:: Private residence Living Arrangements: Alone Available Help at Discharge: Available 24 hours/day;Family Type of Home: House Home Access: Stairs to enter   [employer] of Steps: 1+1+1   Home Layout: One level Home Equipment: Cane - single point;Toilet riser      Prior Function            PT Goals (current goals can now be found in the care plan section) Acute Rehab PT Goals Patient Stated Goal: Regain IND PT Goal Formulation: With patient Time For Goal Achievement: 12/09/23 Potential to Achieve Goals: Good Progress towards PT goals: Progressing  toward goals    Frequency    7X/week      PT Plan      Co-evaluation              AM-PAC PT 6 Clicks Mobility   Outcome Measure  Help needed turning from your back to your side while in a flat bed without using bedrails?: A Little Help needed moving from lying on your back to sitting on the side of a flat bed without using bedrails?: A Little Help needed moving to and from a bed to a chair (including a wheelchair)?: A Little Help needed standing up from a chair using your arms (e.g., wheelchair or bedside chair)?: A Little Help needed to walk in hospital room?: A Little Help needed climbing 3-5 steps with a railing? : A Little 6 Click Score: 18    End of Session Equipment Utilized During Treatment: Gait belt Activity Tolerance: Patient tolerated treatment well Patient left: in chair;with call bell/phone within reach;with chair alarm set Nurse Communication: Mobility status PT Visit Diagnosis: Difficulty in walking, not elsewhere classified (R26.2)     Time: 8882-8851 PT Time Calculation (min) (ACUTE ONLY): 31 min  Charges:    $Gait Training: 8-22 mins $Therapeutic Activity: 8-22 mins PT General Charges $$ ACUTE PT VISIT: 1 Visit                     Katrinka Acton PT Acute Rehabilitation Services Pager [phone] Office [phone]    Cynda Soule 12/02/2023, 12:55 PM

## 2023-12-02 NOTE — Care Management Obs Status (Signed)
 MEDICARE OBSERVATION STATUS NOTIFICATION   Patient Details  Name: Marilyn Myers MRN: [id number] Date of Birth: June 06, 1951   Medicare Observation Status Notification Given:  Hart Robinsons, LCSW 12/02/2023, 11:58 AM

## 2023-12-02 NOTE — TOC Transition Note (Signed)
 Transition of Care [facility]) - Discharge Note   Patient Details  Name: Marilyn Myers MRN: [id number] Date of Birth: 1951/05/06  Transition of Care [facility]) CM/SW Contact:  NORMAN ASPEN, LCSW Phone Number: 12/02/2023, 11:17 AM   Clinical Narrative:     Met with pt and have confirmed that RW has been delivered to room via [employer].  Plan for HEP.  No further TOC needs.  Final next level of care: Home/Self Care Barriers to Discharge: No Barriers Identified   Patient Goals and CMS Choice Patient states their goals for this hospitalization and ongoing recovery are:: return home          Discharge Placement                       Discharge Plan and Services Additional resources added to the After Visit Summary for                  DME Arranged: Walker rolling DME Agency: [employer]       HH Arranged: NA HH Agency: NA        Social Drivers of Health (SDOH) Interventions SDOH Screenings   Food Insecurity: No Food Insecurity (12/01/2023)  Housing: Low Risk  (12/01/2023)  Transportation Needs: No Transportation Needs (12/01/2023)  Utilities: Not At Risk (12/01/2023)  Depression (PHQ2-9): Low Risk  (06/23/2023)  Financial Resource Strain: Low Risk  (01/05/2023)  Physical Activity: Inactive (01/05/2023)  Social Connections: Moderately Integrated (12/01/2023)  Stress: No Stress Concern Present (01/05/2023)  Tobacco Use: Medium Risk (12/01/2023)     Readmission Risk Interventions     No data to display

## 2023-12-02 NOTE — Plan of Care (Signed)
  Problem: Education: Goal: Knowledge of General Education information will improve Description: Including pain rating scale, medication(s)/side effects and non-pharmacologic comfort measures Outcome: Adequate for Discharge   Problem: Health Behavior/Discharge Planning: Goal: Ability to manage health-related needs will improve Outcome: Adequate for Discharge   Problem: Clinical Measurements: Goal: Ability to maintain clinical measurements within normal limits will improve Outcome: Adequate for Discharge Goal: Will remain free from infection Outcome: Adequate for Discharge Goal: Diagnostic test results will improve Outcome: Adequate for Discharge Goal: Respiratory complications will improve Outcome: Adequate for Discharge Goal: Cardiovascular complication will be avoided Outcome: Adequate for Discharge   Problem: Activity: Goal: Risk for activity intolerance will decrease Outcome: Adequate for Discharge   Problem: Nutrition: Goal: Adequate nutrition will be maintained Outcome: Adequate for Discharge   Problem: Coping: Goal: Level of anxiety will decrease Outcome: Adequate for Discharge   Problem: Elimination: Goal: Will not experience complications related to bowel motility Outcome: Adequate for Discharge Goal: Will not experience complications related to urinary retention Outcome: Adequate for Discharge   Problem: Pain Management: Goal: General experience of comfort will improve Outcome: Adequate for Discharge   Problem: Safety: Goal: Ability to remain free from injury will improve Outcome: Adequate for Discharge   Problem: Skin Integrity: Goal: Risk for impaired skin integrity will decrease Outcome: Adequate for Discharge   Problem: Education: Goal: Ability to describe self-care measures that may prevent or decrease complications (Diabetes Survival Skills Education) will improve Outcome: Adequate for Discharge Goal: Individualized Educational Video(s) Outcome:  Adequate for Discharge   Problem: Coping: Goal: Ability to adjust to condition or change in health will improve Outcome: Adequate for Discharge   Problem: Fluid Volume: Goal: Ability to maintain a balanced intake and output will improve Outcome: Adequate for Discharge   Problem: Health Behavior/Discharge Planning: Goal: Ability to identify and utilize available resources and services will improve Outcome: Adequate for Discharge Goal: Ability to manage health-related needs will improve Outcome: Adequate for Discharge   Problem: Metabolic: Goal: Ability to maintain appropriate glucose levels will improve Outcome: Adequate for Discharge   Problem: Nutritional: Goal: Maintenance of adequate nutrition will improve Outcome: Adequate for Discharge Goal: Progress toward achieving an optimal weight will improve Outcome: Adequate for Discharge   Problem: Skin Integrity: Goal: Risk for impaired skin integrity will decrease Outcome: Adequate for Discharge   Problem: Tissue Perfusion: Goal: Adequacy of tissue perfusion will improve Outcome: Adequate for Discharge   Problem: Education: Goal: Knowledge of the prescribed therapeutic regimen will improve Outcome: Adequate for Discharge Goal: Understanding of discharge needs will improve Outcome: Adequate for Discharge Goal: Individualized Educational Video(s) Outcome: Adequate for Discharge   Problem: Activity: Goal: Ability to avoid complications of mobility impairment will improve Outcome: Adequate for Discharge Goal: Ability to tolerate increased activity will improve Outcome: Adequate for Discharge   Problem: Clinical Measurements: Goal: Postoperative complications will be avoided or minimized Outcome: Adequate for Discharge   Problem: Pain Management: Goal: Pain level will decrease with appropriate interventions Outcome: Adequate for Discharge   Problem: Skin Integrity: Goal: Will show signs of wound healing Outcome:  Adequate for Discharge

## 2023-12-02 NOTE — Evaluation (Signed)
 Physical Therapy Evaluation Patient Details Name: Marilyn Myers MRN: [id number] DOB: 1951-06-03 Today's Date: 12/02/2023  History of Present Illness  Pt s/p R THR and with hx of DM  Clinical Impression  Pt s/p R THR and presents with decreased R LE strength/ROM and post op pain limiting functional mobility.  Pt should progress to discharge home with family assist.        If plan is discharge home, recommend the following: A little help with walking and/or transfers;A little help with bathing/dressing/bathroom;Assistance with cooking/housework;Assist for transportation;Help with stairs or ramp for entrance   Can travel by private vehicle        Equipment Recommendations Rolling walker (2 wheels)  Recommendations for Other Services  OT consult    Functional Status Assessment Patient has had a recent decline in their functional status and demonstrates the ability to make significant improvements in function in a reasonable and predictable amount of time.     Precautions / Restrictions Precautions Precautions: Fall Restrictions Weight Bearing Restrictions Per Provider Order: No RLE Weight Bearing Per Provider Order: Weight bearing as tolerated      Mobility  Bed Mobility Overal bed mobility: Needs Assistance Bed Mobility: Supine to Sit     Supine to sit: Contact guard, Supervision     General bed mobility comments: Increased time but no physical assist    Transfers Overall transfer level: Needs assistance Equipment used: Rolling walker (2 wheels) Transfers: Sit to/from Stand Sit to Stand: Min assist, Contact guard assist           General transfer comment: cues for LE management and use of UEs to self assist    Ambulation/Gait Ambulation/Gait assistance: Min assist, Contact guard assist Gait Distance (Feet): 150 Feet Assistive device: Rolling walker (2 wheels) Gait Pattern/deviations: Step-to pattern, Decreased step length - right, Decreased step length -  left, Shuffle, Trunk flexed Gait velocity: decr     General Gait Details: min cues for posture, sequence and position from [employer]            Wheelchair Mobility     Tilt Bed    Modified Rankin (Stroke Patients Only)       Balance                                             Pertinent Vitals/Pain Pain Assessment Pain Assessment: 0-10 Pain Score: 2  Pain Location: R hip Pain Descriptors / Indicators: Aching, Sore Pain Intervention(s): Limited activity within patient's tolerance, Monitored during session, Premedicated before session, Ice applied    Home Living Family/patient expects to be discharged to:: Private residence Living Arrangements: Alone Available Help at Discharge: Available 24 hours/day;Family Type of Home: House Home Access: Stairs to enter   [employer] of Steps: 1+1+1   Home Layout: One level Home Equipment: Cane - single point;Toilet riser      Prior Function Prior Level of Function : Independent/Modified Independent             Mobility Comments: using cane out of home       Extremity/Trunk Assessment   Upper Extremity Assessment Upper Extremity Assessment: Overall WFL for tasks assessed    Lower Extremity Assessment Lower Extremity Assessment: RLE deficits/detail    Cervical / Trunk Assessment Cervical / Trunk Assessment: Normal  Communication   Communication Communication: No apparent difficulties  Cognition Arousal: Alert  Behavior During Therapy: WFL for tasks assessed/performed Overall Cognitive Status: Within Functional Limits for tasks assessed                                          General Comments      Exercises Total Joint Exercises Ankle Circles/Pumps: AROM, Both, 15 reps, Supine Hip ABduction/ADduction: AROM, Standing, 20 reps, Right   Assessment/Plan    PT Assessment Patient needs continued PT services  PT Problem List Decreased strength;Decreased  range of motion;Decreased activity tolerance;Decreased balance;Decreased mobility;Decreased knowledge of use of DME;Pain       PT Treatment Interventions DME instruction;Gait training;Stair training;Functional mobility training;Therapeutic activities;Therapeutic exercise;Patient/family education    PT Goals (Current goals can be found in the Care Plan section)  Acute Rehab PT Goals Patient Stated Goal: Regain IND PT Goal Formulation: With patient Time For Goal Achievement: 12/09/23 Potential to Achieve Goals: Good    Frequency 7X/week     Co-evaluation               AM-PAC PT 6 Clicks Mobility  Outcome Measure Help needed turning from your back to your side while in a flat bed without using bedrails?: A Little Help needed moving from lying on your back to sitting on the side of a flat bed without using bedrails?: A Little Help needed moving to and from a bed to a chair (including a wheelchair)?: A Little Help needed standing up from a chair using your arms (e.g., wheelchair or bedside chair)?: A Little Help needed to walk in hospital room?: A Little Help needed climbing 3-5 steps with a railing? : A Little 6 Click Score: 18    End of Session Equipment Utilized During Treatment: Gait belt Activity Tolerance: Patient tolerated treatment well Patient left: in chair;with call bell/phone within reach;with chair alarm set Nurse Communication: Mobility status PT Visit Diagnosis: Difficulty in walking, not elsewhere classified (R26.2)    Time: 9087-9064 PT Time Calculation (min) (ACUTE ONLY): 23 min   Charges:   PT Evaluation $PT Eval Low Complexity: 1 Low PT Treatments $Gait Training: 8-22 mins PT General Charges $$ ACUTE PT VISIT: 1 Visit         Katrinka Acton PT Acute Rehabilitation Services Pager [phone] Office [phone]   Keylah Darwish 12/02/2023, 12:44 PM

## 2023-12-05 NOTE — Discharge Summary (Signed)
 Physician Discharge Summary  Patient ID: Marilyn Myers MRN: [id number] DOB/AGE: [birth year] 73 y.o.  Admit date: 12/01/2023 Discharge date: 12/02/2023  Admission Diagnoses:  Osteoarthritis of right hip  Discharge Diagnoses:  Principal Problem:   Osteoarthritis of right hip Active Problems:   S/P total right hip arthroplasty   Past Medical History:  Diagnosis Date   Anxiety    Arthritis    Diverticulitis of intestine without perforation or abscess    Diverticulosis, sigmoid    Dysrhythmia    GERD (gastroesophageal reflux disease)    History of diverticulitis of colon    09/ 2015  RESOLVED    History of kidney stones    Hypertension    Nephrolithiasis    BILATERAL   OSA on CPAP    no longer uses    Right ureteral stone    Seasonal allergic rhinitis 01/24/2018   Type 2 diabetes mellitus (HCC)    type 2     Surgeries: Procedure(s): TOTAL HIP ARTHROPLASTY ANTERIOR APPROACH on 12/01/2023   Consultants (if any):   Discharged Condition: Improved  Hospital Course: Marilyn Myers is an 73 y.o. female who was admitted 12/01/2023 with a diagnosis of Osteoarthritis of right hip and went to the operating room on 12/01/2023 and underwent the above named procedures.    She was given perioperative antibiotics:  Anti-infectives (From admission, onward)    Start     Dose/Rate Route Frequency Ordered Stop   12/01/23 1930  ceFAZolin  (ANCEF ) IVPB 2g/100 mL premix        2 g 200 mL/hr over 30 Minutes Intravenous Every 6 hours 12/01/23 1654 12/02/23 0217   12/01/23 0915  ceFAZolin  (ANCEF ) IVPB 2g/100 mL premix        2 g 200 mL/hr over 30 Minutes Intravenous On call to O.R. 12/01/23 0900 12/01/23 1330       She was given sequential compression devices, early ambulation, and aspirin  for DVT prophylaxis.  POD#1 ABLA hemoglobin 11.1, asymptomatic. Chronic hypokalemia: K+ 3.3 am labs. She is on 10 mEq potassium at baseline. She reports she has not taken in 1 week. Will resume home dose.  Creatinine: 1.31 am labs (1.03 11/25/2023).  IV fluids continued. Recheck pm BMP K+ 3.5, Cr improved 1.21. She ambulated well with PT 150 feet. D/c home with HEP.   She benefited maximally from the hospital stay and there were no complications.    Recent vital signs:  Vitals:   12/02/23 1017 12/02/23 1456  BP: 101/87 115/68  Pulse: 85 76  Resp: 16 16  Temp: 98.1 F (36.7 C) 98.7 F (37.1 C)  SpO2: 97% 98%    Recent laboratory studies:  Lab Results  Component Value Date   HGB 11.1 (L) 12/02/2023   HGB 14.0 11/25/2023   HGB 11.2 (L) 03/24/2023   Lab Results  Component Value Date   WBC 14.5 (H) 12/02/2023   PLT 231 12/02/2023   No results found for: INR Lab Results  Component Value Date   NA 135 12/02/2023   K 3.5 12/02/2023   CL 102 12/02/2023   CO2 23 12/02/2023   BUN 34 (H) 12/02/2023   CREATININE 1.21 (H) 12/02/2023   GLUCOSE 176 (H) 12/02/2023     Allergies as of 12/02/2023       Reactions   Ace Inhibitors Cough   Codeine Nausea And Vomiting   Flomax [tamsulosin Hcl] Itching        Medication List     TAKE these medications  Aspirin  Low Dose 81 MG chewable tablet Generic drug: aspirin  Chew 1 tablet (81 mg total) by mouth 2 (two) times daily with a meal.   diltiazem  120 MG 24 hr capsule Commonly known as: CARDIZEM  CD TAKE 1 CAPSULE BY MOUTH DAILY What changed: when to take this   docusate sodium  100 MG capsule Commonly known as: COLACE Take 100 mg by mouth 2 (two) times daily. What changed: Another medication with the same name was added. Make sure you understand how and when to take each.   docusate sodium  100 MG capsule Commonly known as: Colace Take 1 capsule (100 mg total) by mouth 2 (two) times daily. What changed: You were already taking a medication with the same name, and this prescription was added. Make sure you understand how and when to take each.   doxepin  25 MG capsule Commonly known as: SINEQUAN  TAKE 1 CAPSULE BY MOUTH AT  BEDTIME What changed:  when to take this reasons to take this   DULoxetine  60 MG capsule Commonly known as: CYMBALTA  TAKE ONE CAPSULE BY MOUTH DAILY   ezetimibe  10 MG tablet Commonly known as: ZETIA  TAKE ONE TABLET BY MOUTH DAILY   ferrous sulfate  325 (65 FE) MG tablet Take 325 mg by mouth every evening.   gabapentin  800 MG tablet Commonly known as: Neurontin  Take 1 tablet (800 mg total) by mouth 3 (three) times daily. TAKE 2 TABLETS 3 TIMES A DAY BY MOUTH What changed:  how much to take additional instructions   glipiZIDE  5 MG 24 hr tablet Commonly known as: GLUCOTROL  XL TAKE 1 TABLET BY MOUTH DAILY WITH BREAKFAST   HYDROcodone -acetaminophen  5-325 MG tablet Commonly known as: NORCO/VICODIN Take 1 tablet by mouth every 4 (four) hours as needed for up to 7 days for moderate pain (pain score 4-6) or severe pain (pain score 7-10).   losartan -hydrochlorothiazide  100-25 MG tablet Commonly known as: HYZAAR TAKE ONE TABLET BY MOUTH DAILY   metFORMIN  1000 MG tablet Commonly known as: GLUCOPHAGE  TAKE ONE TABLET BY MOUTH TWICE A DAY   multivitamin with minerals tablet Take 1 tablet by mouth every evening.   nystatin  powder Commonly known as: MYCOSTATIN /NYSTOP  Apply 1 Application topically 3 (three) times daily as needed (skin irritation/rash.). What changed: Another medication with the same name was added. Make sure you understand how and when to take each.   nystatin  powder Commonly known as: MYCOSTATIN /NYSTOP  Apply 1 Application topically 3 (three) times daily. What changed: You were already taking a medication with the same name, and this prescription was added. Make sure you understand how and when to take each.   ondansetron  4 MG tablet Commonly known as: Zofran  Take 1 tablet (4 mg total) by mouth every 8 (eight) hours as needed for nausea or vomiting.   pantoprazole  20 MG tablet Commonly known as: PROTONIX  TAKE 1 TO 2 TABLETS BY MOUTH DAILY What changed:  how  much to take how to take this when to take this additional instructions   polyethylene glycol powder 17 GM/SCOOP powder Commonly known as: GLYCOLAX /MIRALAX  Dissolve 17 g in liquid and take by mouth daily as needed for mild constipation or moderate constipation.   Potassium Chloride  ER 20 MEQ Tbcr TAKE ONE TABLET BY MOUTH DAILY   senna 8.6 MG Tabs tablet Commonly known as: SENOKOT Take 2 tablets (17.2 mg total) by mouth at bedtime for 15 days.   VITAMIN B-12 PO Take 1 tablet by mouth in the morning.  Discharge Care Instructions  (From admission, onward)           Start     Ordered   12/02/23 0000  Weight bearing as tolerated        12/02/23 1459   12/02/23 0000  Change dressing       Comments: Do not change your dressing.   12/02/23 1459              WEIGHT BEARING   Weight bearing as tolerated with assist device (walker, cane, etc) as directed, use it as long as suggested by your surgeon or therapist, typically at least 4-6 weeks.   EXERCISES  Results after joint replacement surgery are often greatly improved when you follow the exercise, range of motion and muscle strengthening exercises prescribed by your doctor. Safety measures are also important to protect the joint from further injury. Any time any of these exercises cause you to have increased pain or swelling, decrease what you are doing until you are comfortable again and then slowly increase them. If you have problems or questions, call your caregiver or physical therapist for advice.   Rehabilitation is important following a joint replacement. After just a few days of immobilization, the muscles of the leg can become weakened and shrink (atrophy).  These exercises are designed to build up the tone and strength of the thigh and leg muscles and to improve motion. Often times heat used for twenty to thirty minutes before working out will loosen up your tissues and help with improving the  range of motion but do not use heat for the first two weeks following surgery (sometimes heat can increase post-operative swelling).   These exercises can be done on a training (exercise) mat, on the floor, on a table or on a bed. Use whatever works the best and is most comfortable for you.    Use music or television while you are exercising so that the exercises are a pleasant break in your day. This will make your life better with the exercises acting as a break in your routine that you can look forward to.   Perform all exercises about fifteen times, three times per day or as directed.  You should exercise both the operative leg and the other leg as well.  Exercises include:   Quad Sets - Tighten up the muscle on the front of the thigh (Quad) and hold for 5-10 seconds.   Straight Leg Raises - With your knee straight (if you were given a brace, keep it on), lift the leg to 60 degrees, hold for 3 seconds, and slowly lower the leg.  Perform this exercise against resistance later as your leg gets stronger.  Leg Slides: Lying on your back, slowly slide your foot toward your buttocks, bending your knee up off the floor (only go as far as is comfortable). Then slowly slide your foot back down until your leg is flat on the floor again.  Angel Wings: Lying on your back spread your legs to the side as far apart as you can without causing discomfort.  Hamstring Strength:  Lying on your back, push your heel against the floor with your leg straight by tightening up the muscles of your buttocks.  Repeat, but this time bend your knee to a comfortable angle, and push your heel against the floor.  You may put a pillow under the heel to make it more comfortable if necessary.   A rehabilitation program following joint replacement surgery can speed  recovery and prevent re-injury in the future due to weakened muscles. Contact your doctor or a physical therapist for more information on knee rehabilitation.     CONSTIPATION  Constipation is defined medically as fewer than three stools per week and severe constipation as less than one stool per week.  Even if you have a regular bowel pattern at home, your normal regimen is likely to be disrupted due to multiple reasons following surgery.  Combination of anesthesia, postoperative narcotics, change in appetite and fluid intake all can affect your bowels.   YOU MUST use at least one of the following options; they are listed in order of increasing strength to get the job done.  They are all available over the counter, and you may need to use some, POSSIBLY even all of these options:    Drink plenty of fluids (prune juice may be helpful) and high fiber foods Colace 100 mg by mouth twice a day  Senokot for constipation as directed and as needed Dulcolax (bisacodyl ), take with full glass of water   Miralax  (polyethylene glycol) once or twice a day as needed.  If you have tried all these things and are unable to have a bowel movement in the first 3-4 days after surgery call either your surgeon or your primary doctor.    If you experience loose stools or diarrhea, hold the medications until you stool forms back up.  If your symptoms do not get better within 1 week or if they get worse, check with your doctor.  If you experience the worst abdominal pain ever or develop nausea or vomiting, please contact the office immediately for further recommendations for treatment.   ITCHING:  If you experience itching with your medications, try taking only a single pain pill, or even half a pain pill at a time.  You can also use Benadryl  over the counter for itching or also to help with sleep.   TED HOSE STOCKINGS:  Use stockings on both legs until for at least 2 weeks or as directed by physician office. They may be removed at night for sleeping.  MEDICATIONS:  See your medication summary on the "After Visit Summary" that nursing will review with you.  You may have some  home medications which will be placed on hold until you complete the course of blood thinner medication.  It is important for you to complete the blood thinner medication as prescribed.  PRECAUTIONS:  If you experience chest pain or shortness of breath - call 911 immediately for transfer to the hospital emergency department.   If you develop a fever greater that 101 F, purulent drainage from wound, increased redness or drainage from wound, foul odor from the wound/dressing, or calf pain - CONTACT YOUR SURGEON.                                                   FOLLOW-UP APPOINTMENTS:  If you do not already have a post-op appointment, please call the office for an appointment to be seen by your surgeon.  Guidelines for how soon to be seen are listed in your "After Visit Summary", but are typically between 1-4 weeks after surgery.  OTHER INSTRUCTIONS:   Knee Replacement:  Do not place pillow under knee, focus on keeping the knee straight while resting. CPM instructions: 0-90 degrees, 2 hours in the morning,  2 hours in the afternoon, and 2 hours in the evening. Place foam block, curve side up under heel at all times except when in CPM or when walking.  DO NOT modify, tear, cut, or change the foam block in any way.   MAKE SURE YOU:  Understand these instructions.  Get help right away if you are not doing well or get worse.    Thank you for letting us  be a part of your medical care team.  It is a privilege we respect greatly.  We hope these instructions will help you stay on track for a fast and full recovery!   Diagnostic Studies: DG Pelvis Portable Result Date: 12/01/2023 CLINICAL DATA:  Status post right hip arthroplasty. EXAM: PORTABLE PELVIS 1-2 VIEWS COMPARISON:  None Available. FINDINGS: Right hip arthroplasty in expected alignment. No periprosthetic lucency or fracture. Recent postsurgical change includes air and edema in the soft tissues. IMPRESSION: Right hip arthroplasty without immediate  postoperative complication. Electronically Signed   By: Andrea Gasman M.D.   On: 12/01/2023 16:54   DG HIP UNILAT WITH PELVIS 1V RIGHT Result Date: 12/01/2023 CLINICAL DATA:  Total right hip arthroplasty. Intraoperative fluoroscopy. EXAM: DG HIP (WITH OR WITHOUT PELVIS) 1V RIGHT COMPARISON:  [facility] 07/05/2022 FINDINGS: Images were performed intraoperatively without the presence of a radiologist. Severe severe right femoroacetabular joint space narrowing, subchondral sclerosis, and peripheral osteophytosis. The patient is undergoing total right hip arthroplasty. No hardware complication is seen. Total fluoroscopy images: 5 Total fluoroscopy time: 10 seconds Total dose: Radiation Exposure Index (as provided by the fluoroscopic device): 2.03 mGy air Kerma Please see intraoperative findings for further detail. IMPRESSION: Intraoperative fluoroscopy for total right hip arthroplasty. Electronically Signed   By: Tanda Lyons M.D.   On: 12/01/2023 15:01   DG C-Arm 1-60 Min-No Report Result Date: 12/01/2023 Fluoroscopy was utilized by the requesting physician.  No radiographic interpretation.    Disposition: Discharge disposition: 01-Home or Self Care       Discharge Instructions     Call MD / Call 911   Complete by: As directed    If you experience chest pain or shortness of breath, CALL 911 and be transported to the hospital emergency room.  If you develope a fever above 101 F, pus (white drainage) or increased drainage or redness at the wound, or calf pain, call your surgeon's office.   Change dressing   Complete by: As directed    Do not change your dressing.   Constipation Prevention   Complete by: As directed    Drink plenty of fluids.  Prune juice may be helpful.  You may use a stool softener, such as Colace (over the counter) 100 mg twice a day.  Use MiraLax  (over the counter) for constipation as needed.   Diet Carb Modified   Complete by: As directed    Discharge instructions   Complete  by: As directed    Elevate toes above nose. Use cryotherapy as needed for pain and swelling.   Driving restrictions   Complete by: As directed    No driving for 6 weeks   Increase activity slowly as tolerated   Complete by: As directed    Lifting restrictions   Complete by: As directed    No lifting for 6 weeks   Post-operative opioid taper instructions:   Complete by: As directed    POST-OPERATIVE OPIOID TAPER INSTRUCTIONS: It is important to wean off of your opioid medication as soon as possible. If you do not  need pain medication after your surgery it is ok to stop day one. Opioids include: Codeine, Hydrocodone (Norco, Vicodin), Oxycodone (Percocet, oxycontin ) and hydromorphone  amongst others.  Long term and even short term use of opiods can cause: Increased pain response Dependence Constipation Depression Respiratory depression And more.  Withdrawal symptoms can include Flu like symptoms Nausea, vomiting And more Techniques to manage these symptoms Hydrate well Eat regular healthy meals Stay active Use relaxation techniques(deep breathing, meditating, yoga) Do Not substitute Alcohol  to help with tapering If you have been on opioids for less than two weeks and do not have pain than it is ok to stop all together.  Plan to wean off of opioids This plan should start within one week post op of your joint replacement. Maintain the same interval or time between taking each dose and first decrease the dose.  Cut the total daily intake of opioids by one tablet each day Next start to increase the time between doses. The last dose that should be eliminated is the evening dose.      TED hose   Complete by: As directed    Use stockings (TED hose) for 2 weeks on both leg(s).  You may remove them at night for sleeping.   Weight bearing as tolerated   Complete by: As directed         Follow-up Information     Leigh Valery RAMAN, PA-C. Go on 12/13/2023.   Specialty: Orthopedic  Surgery Why: You are scheduled for a follow up appointment on 12-13-23 at 10:15 am. Contact information: [address] [place] [address] [phone]                  Signed: Valery RAMAN Leigh 12/05/2023, 5:24 PM

## 2023-12-13 DIAGNOSIS — Z471 Aftercare following joint replacement surgery: Secondary | ICD-10-CM | POA: Diagnosis not present

## 2023-12-13 DIAGNOSIS — Z96641 Presence of right artificial hip joint: Secondary | ICD-10-CM | POA: Diagnosis not present

## 2023-12-26 ENCOUNTER — Ambulatory Visit: Payer: Medicare Other | Admitting: Family Medicine

## 2023-12-27 ENCOUNTER — Ambulatory Visit: Payer: Self-pay | Admitting: Family Medicine

## 2023-12-27 NOTE — Telephone Encounter (Signed)
 Copied from CRM [id number]. Topic: Clinical - Red Word Triage >> Dec 27, 2023  1:32 PM Macario HERO wrote: Red Word that prompted transfer to Nurse Triage: Patient stated she pulled a hangnail and it's now infected and is needing an antibiotic. Finger currently hurting, red and swollen.   Chief Complaint: Finger pain/swelling Symptoms: Left middle finger pain around fingernail Frequency: Constant  Pertinent Negatives: Patient denies fevers, pus at fingernail  Disposition: [] ED /[] Urgent Care (no appt availability in office) / [x] Appointment(In office/virtual)/ []  [facility]/ [] Home Care/ [x] Refused Recommended Disposition /[]  Mobile Bus/ []  Follow-up with PCP Additional Notes: Patient reports that approximately 3 weeks ago she pulled a hangnail and has been experiencing pain and swelling to the rea since that time. She states that her pain is 5/10 and that there is redness and swelling around the fingernail. She believes it's infected and would like an antibiotic. Patient has declined an appointment, stating she has one to be seen next week and would not be able to come in before then.  I advised I would sent the request for an antibiotic to the office for her.      Reason for Disposition  Redness and painful skin around fingernail (cuticle, nailfold)  Answer Assessment - Initial Assessment Questions 1. ONSET: When did the pain start?      3 weeks ago 2. LOCATION and RADIATION: Where is the pain located?  (e.g., fingertip, around nail, joint, entire  finger)      Left hand, middle finger, around nail 3. SEVERITY: How bad is the pain? What does it keep you from doing?   (Scale 1-10; or mild, moderate, severe)  - MILD (1-3): doesn't interfere with normal activities.   - MODERATE (4-7): interferes with normal activities or awakens from sleep.  - SEVERE (8-10): excruciating pain, unable to hold a glass of water  or bend finger even a little.     5/10 4. APPEARANCE:  What does the finger look like? (e.g., redness, swelling, bruising, pallor)     Swelling and redness 5. WORK OR EXERCISE: Has there been any recent work or exercise that involved this part (i.e., fingers or hand) of the body?     No 6. CAUSE: What do you think is causing the pain?     Pulled hangnail, now believes it might be infected  7. AGGRAVATING FACTORS: What makes the pain worse? (e.g., using computer)     Using finger exacerbates pain  8. OTHER SYMPTOMS: Do you have any other symptoms? (e.g., fever, neck pain, numbness)     No 9. PREGNANCY: Is there any chance you are pregnant? When was your last menstrual period?     No  Protocols used: Finger Pain-A-AH

## 2024-01-04 ENCOUNTER — Ambulatory Visit (INDEPENDENT_AMBULATORY_CARE_PROVIDER_SITE_OTHER): Payer: Medicare Other | Admitting: Family Medicine

## 2024-01-04 ENCOUNTER — Encounter: Payer: Self-pay | Admitting: Family Medicine

## 2024-01-04 VITALS — BP 128/76 | HR 58 | Temp 97.7°F | Ht 66.0 in | Wt 204.0 lb

## 2024-01-04 DIAGNOSIS — Z5320 Procedure and treatment not carried out because of patient's decision for unspecified reasons: Secondary | ICD-10-CM | POA: Diagnosis not present

## 2024-01-04 DIAGNOSIS — K219 Gastro-esophageal reflux disease without esophagitis: Secondary | ICD-10-CM | POA: Diagnosis not present

## 2024-01-04 DIAGNOSIS — E1142 Type 2 diabetes mellitus with diabetic polyneuropathy: Secondary | ICD-10-CM | POA: Diagnosis not present

## 2024-01-04 DIAGNOSIS — Z96641 Presence of right artificial hip joint: Secondary | ICD-10-CM | POA: Diagnosis not present

## 2024-01-04 DIAGNOSIS — E1159 Type 2 diabetes mellitus with other circulatory complications: Secondary | ICD-10-CM

## 2024-01-04 DIAGNOSIS — G4709 Other insomnia: Secondary | ICD-10-CM | POA: Diagnosis not present

## 2024-01-04 DIAGNOSIS — I152 Hypertension secondary to endocrine disorders: Secondary | ICD-10-CM | POA: Diagnosis not present

## 2024-01-04 LAB — POCT GLYCOSYLATED HEMOGLOBIN (HGB A1C): Hemoglobin A1C: 6.2 % — AB (ref 4.0–5.6)

## 2024-01-04 MED ORDER — EZETIMIBE 10 MG PO TABS: 10.0000 mg | ORAL_TABLET | Freq: Every day | ORAL | 3 refills | Status: DC

## 2024-01-04 MED ORDER — METFORMIN HCL 1000 MG PO TABS: 1000.0000 mg | ORAL_TABLET | Freq: Two times a day (BID) | ORAL | 3 refills | Status: DC

## 2024-01-04 MED ORDER — POTASSIUM CHLORIDE ER 20 MEQ PO TBCR: 1.0000 | EXTENDED_RELEASE_TABLET | Freq: Every day | ORAL | 3 refills | Status: DC

## 2024-01-04 MED ORDER — DILTIAZEM HCL ER COATED BEADS 120 MG PO CP24: 120.0000 mg | ORAL_CAPSULE | Freq: Every evening | ORAL | Status: DC

## 2024-01-04 MED ORDER — LOSARTAN POTASSIUM-HCTZ 100-25 MG PO TABS: 1.0000 | ORAL_TABLET | Freq: Every day | ORAL | 3 refills | Status: DC

## 2024-01-04 MED ORDER — DOXEPIN HCL 25 MG PO CAPS: 25.0000 mg | ORAL_CAPSULE | Freq: Every evening | ORAL | 3 refills | Status: AC | PRN

## 2024-01-04 MED ORDER — GABAPENTIN 800 MG PO TABS
1600.0000 mg | ORAL_TABLET | Freq: Three times a day (TID) | ORAL | Status: DC
Start: 2024-01-04 — End: 2024-07-31

## 2024-01-04 MED ORDER — PANTOPRAZOLE SODIUM 20 MG PO TBEC: 20.0000 mg | DELAYED_RELEASE_TABLET | Freq: Every evening | ORAL | 3 refills | Status: DC

## 2024-01-04 NOTE — Progress Notes (Signed)
Subjective  CC:  Chief Complaint  Patient presents with   Diabetes    HPI: Marilyn Myers is a 73 y.o. female who presents to the office today for follow up of diabetes and problems listed above in the chief complaint.  Diabetes follow up:  she is about 6 weeks out from right THR. Doing well. Her diabetic control is reported as Unchanged. Tolerating glucotrol xl and metformin. No sxs of hyperglycemia.  She denies exertional CP or SOB or symptomatic hypoglycemia. She denies foot sores or paresthesias. High dose gabapentin is controlling her neuropathy Refuses statin but on zetia Bp is controlled.  Uses doxepin for sleep and needs refill. Wt Readings from Last 3 Encounters:  01/04/24 204 lb (92.5 kg)  12/01/23 200 lb (90.7 kg)  11/25/23 200 lb (90.7 kg)    BP Readings from Last 3 Encounters:  01/04/24 128/76  12/02/23 115/68  11/25/23 106/60    Assessment  1. Controlled type 2 diabetes mellitus with diabetic polyneuropathy, without long-term current use of insulin (HCC)   2. Refusal of statin medication by patient   3. Diabetic peripheral neuropathy (HCC)   4. Hypertension associated with diabetes (HCC)   5. Secondary insomnia   6. Gastroesophageal reflux disease without esophagitis   7. Status post hip replacement, right      Plan  Diabetes is currently very well controlled.  HTN is controlled, refilled meds Insomnia is controlled. Refilled sinequan.  Refilled protonix for chronic gerd.  S/p hip replacement and rehabbing well.  Follow up: 3 mo for cpe and recheck Orders Placed This Encounter  Procedures   POCT HgB A1C   Meds ordered this encounter  Medications   diltiazem (CARDIZEM CD) 120 MG 24 hr capsule    Sig: Take 1 capsule (120 mg total) by mouth every evening.   doxepin (SINEQUAN) 25 MG capsule    Sig: Take 1 capsule (25 mg total) by mouth at bedtime as needed (sleep).    Dispense:  90 capsule    Refill:  3   ezetimibe (ZETIA) 10 MG tablet    Sig:  Take 1 tablet (10 mg total) by mouth daily.    Dispense:  90 tablet    Refill:  3   gabapentin (NEURONTIN) 800 MG tablet    Sig: Take 2 tablets (1,600 mg total) by mouth 3 (three) times daily.   losartan-hydrochlorothiazide (HYZAAR) 100-25 MG tablet    Sig: Take 1 tablet by mouth daily.    Dispense:  90 tablet    Refill:  3   metFORMIN (GLUCOPHAGE) 1000 MG tablet    Sig: Take 1 tablet (1,000 mg total) by mouth 2 (two) times daily.    Dispense:  180 tablet    Refill:  3   pantoprazole (PROTONIX) 20 MG tablet    Sig: Take 1 tablet (20 mg total) by mouth every evening.    Dispense:  90 tablet    Refill:  3   Potassium Chloride ER 20 MEQ TBCR    Sig: Take 1 tablet (20 mEq total) by mouth daily.    Dispense:  90 tablet    Refill:  3      Immunization History  Administered Date(s) Administered   Fluad Quad(high Dose 65+) 08/27/2019, 09/22/2021, 08/16/2022   Influenza, High Dose Seasonal PF 08/19/2014, 09/02/2015, 10/08/2016, 09/22/2017, 08/29/2018   Influenza-Unspecified 10/13/2020   PFIZER(Purple Top)SARS-COV-2 Vaccination 12/29/2019, 01/18/2020, 10/13/2020   Pneumococcal Conjugate-13 10/18/2016   Pneumococcal Polysaccharide-23 08/19/2014    Diabetes Related  Lab Review: Lab Results  Component Value Date   HGBA1C 6.2 (A) 01/04/2024   HGBA1C 6.2 (H) 11/25/2023   HGBA1C 5.9 (A) 06/23/2023    Lab Results  Component Value Date   MICROALBUR 10.3 (H) 03/24/2023   Lab Results  Component Value Date   CREATININE 1.21 (H) 12/02/2023   BUN 34 (H) 12/02/2023   NA 135 12/02/2023   K 3.5 12/02/2023   CL 102 12/02/2023   CO2 23 12/02/2023   Lab Results  Component Value Date   CHOL 183 03/24/2023   CHOL 148 05/12/2022   CHOL 212 (H) 02/05/2022   Lab Results  Component Value Date   HDL 45.90 03/24/2023   HDL 44.60 05/12/2022   HDL 54.30 02/05/2022   Lab Results  Component Value Date   LDLCALC 70 05/12/2022   LDLCALC 87 11/06/2018   Lab Results  Component Value Date    TRIG 299.0 (H) 03/24/2023   TRIG 166.0 (H) 05/12/2022   TRIG 224.0 (H) 02/05/2022   Lab Results  Component Value Date   CHOLHDL 4 03/24/2023   CHOLHDL 3 05/12/2022   CHOLHDL 4 02/05/2022   Lab Results  Component Value Date   LDLDIRECT 108.0 03/24/2023   LDLDIRECT 144.0 02/05/2022   LDLDIRECT 117.0 01/16/2021   The 10-year ASCVD risk score (Arnett DK, et al., 2019) is: 33.3%   Values used to calculate the score:     Age: 60 years     Sex: Female     Is Non-Hispanic African American: No     Diabetic: Yes     Tobacco smoker: No     Systolic Blood Pressure: 128 mmHg     Is BP treated: Yes     HDL Cholesterol: 45.9 mg/dL     Total Cholesterol: 183 mg/dL I have reviewed the PMH, Fam and Soc history. Patient Active Problem List   Diagnosis Date Noted Date Diagnosed   Combined hyperlipidemia associated with type 2 diabetes mellitus (HCC) 05/12/2022     Priority: High   Major depression, recurrent, chronic (HCC)      Priority: High   History of suicide attempt - 2022 opiod overdose      Priority: High   Obesity (BMI 30-39.9) 02/22/2019     Priority: High   Diabetic peripheral neuropathy (HCC) 09/28/2016     Priority: High    Failed lyrica    Hypertension associated with diabetes (HCC) 01/28/2015     Priority: High   Obstructive sleep apnea 08/01/2014     Priority: High   Controlled type 2 diabetes mellitus with diabetic polyneuropathy, without long-term current use of insulin (HCC) 06/10/2014     Priority: High    Has never been on GLP-1 or SGLT2.  Cost prohibitive 2023 Ozempic. Glucotrol XL 5 mg started May 2024, trying to get under better control quickly so she can have hip replacement surgery. Tolerates high-dose metformin    Secondary insomnia 06/02/2022     Priority: Medium    Gastroesophageal reflux disease without esophagitis 01/07/2020     Priority: Medium    Osteopenia 05/19/2015     Priority: Medium     Dexa: 05/2023, lowest T = -1.4 left total femur. FRAX  OK. Recheck 2 years. [facility] T = -1.4 Dexa 2016.  Dexa 2019 at GYN: normal at all sites. See records    Diverticulitis of large intestine 08/19/2014     Priority: Medium     Overview:  2015    Nephrolithiasis 08/19/2014  Priority: Medium     Overview:  Dr. Julien Girt    Refusal of statin medication by patient 01/24/2018     Priority: Low    Husband has problems with this medication for her    Seasonal allergic rhinitis 01/24/2018     Priority: Low   PVC's (premature ventricular contractions) 10/08/2016     Priority: Low   S/P colostomy takedown 07/02/2015     Priority: Low   Hydradenitis 08/19/2014     Priority: Low   Status post hip replacement, right 01/04/2024    Osteoarthritis of right hip 12/01/2023    S/P total right hip arthroplasty 12/01/2023     Social History: Patient  reports that she quit smoking about 27 years ago. Her smoking use included cigarettes. She started smoking about 52 years ago. She has a 25 pack-year smoking history. She has never used smokeless tobacco. She reports that she does not drink alcohol and does not use drugs.  Review of Systems: Ophthalmic: negative for eye pain, loss of vision or double vision Cardiovascular: negative for chest pain Respiratory: negative for SOB or persistent cough Gastrointestinal: negative for abdominal pain Genitourinary: negative for dysuria or gross hematuria MSK: negative for foot lesions Neurologic: negative for weakness or gait disturbance  Objective  Vitals: BP 128/76   Pulse (!) 58   Temp 97.7 F (36.5 C)   Ht 5\' 6"  (1.676 m)   Wt 204 lb (92.5 kg)   SpO2 98%   BMI 32.93 kg/m  General: well appearing, no acute distress  Psych:  Alert and oriented, normal mood and affect HEENT:  Normocephalic, atraumatic, moist mucous membranes, supple neck  Cardiovascular:  Nl S1 and S2, RRR without murmur, gallop or rub. no edema Respiratory:  Good breath sounds bilaterally, CTAB with normal effort, no  rales Walking with walker, limp, no edema    Diabetic education: ongoing education regarding chronic disease management for diabetes was given today. We continue to reinforce the ABC's of diabetic management: A1c (<7 or 8 dependent upon patient), tight blood pressure control, and cholesterol management with goal LDL < 100 minimally. We discuss diet strategies, exercise recommendations, medication options and possible side effects. At each visit, we review recommended immunizations and preventive care recommendations for diabetics and stress that good diabetic control can prevent other problems. See below for this patient's data.   Commons side effects, risks, benefits, and alternatives for medications and treatment plan prescribed today were discussed, and the patient expressed understanding of the given instructions. Patient is instructed to call or message via MyChart if he/she has any questions or concerns regarding our treatment plan. No barriers to understanding were identified. We discussed Red Flag symptoms and signs in detail. Patient expressed understanding regarding what to do in case of urgent or emergency type symptoms.  Medication list was reconciled, printed and provided to the patient in AVS. Patient instructions and summary information was reviewed with the patient as documented in the AVS. This note was prepared with assistance of Dragon voice recognition software. Occasional wrong-word or sound-a-like substitutions may have occurred due to the inherent limitations of voice recognition software

## 2024-01-07 ENCOUNTER — Encounter: Payer: Self-pay | Admitting: Family Medicine

## 2024-01-10 DIAGNOSIS — Z96641 Presence of right artificial hip joint: Secondary | ICD-10-CM | POA: Diagnosis not present

## 2024-01-10 DIAGNOSIS — Z471 Aftercare following joint replacement surgery: Secondary | ICD-10-CM | POA: Diagnosis not present

## 2024-01-26 ENCOUNTER — Other Ambulatory Visit: Payer: Self-pay | Admitting: Family Medicine

## 2024-01-26 DIAGNOSIS — F339 Major depressive disorder, recurrent, unspecified: Secondary | ICD-10-CM

## 2024-01-27 ENCOUNTER — Other Ambulatory Visit: Payer: Self-pay | Admitting: Family Medicine

## 2024-02-10 DIAGNOSIS — M25552 Pain in left hip: Secondary | ICD-10-CM | POA: Diagnosis not present

## 2024-02-17 ENCOUNTER — Other Ambulatory Visit (HOSPITAL_BASED_OUTPATIENT_CLINIC_OR_DEPARTMENT_OTHER): Payer: Self-pay

## 2024-02-22 ENCOUNTER — Other Ambulatory Visit (HOSPITAL_COMMUNITY): Payer: Self-pay

## 2024-03-07 DIAGNOSIS — M1612 Unilateral primary osteoarthritis, left hip: Secondary | ICD-10-CM | POA: Diagnosis not present

## 2024-03-08 ENCOUNTER — Ambulatory Visit

## 2024-03-12 ENCOUNTER — Ambulatory Visit (INDEPENDENT_AMBULATORY_CARE_PROVIDER_SITE_OTHER)

## 2024-03-12 VITALS — Ht 66.0 in | Wt 204.0 lb

## 2024-03-12 DIAGNOSIS — Z0000 Encounter for general adult medical examination without abnormal findings: Secondary | ICD-10-CM | POA: Diagnosis not present

## 2024-03-12 NOTE — Progress Notes (Signed)
 Subjective:   Marilyn Myers is a 73 y.o. who presents for a Medicare Wellness preventive visit.  Visit Complete: Virtual I connected with  Delight Felts on 03/12/24 by a audio enabled telemedicine application and verified that I am speaking with the correct person using two identifiers.  Patient Location: Home  Provider Location: Office/Clinic  I discussed the limitations of evaluation and management by telemedicine. The patient expressed understanding and agreed to proceed.  Vital Signs: Because this visit was a virtual/telehealth visit, some criteria may be missing or patient reported. Any vitals not documented were not able to be obtained and vitals that have been documented are patient reported.  VideoDeclined- This patient declined Librarian, academic. Therefore the visit was completed with audio only.  Persons Participating in Visit: Patient.  AWV Questionnaire: No: Patient Medicare AWV questionnaire was not completed prior to this visit.  Cardiac Risk Factors include: advanced age (>89men, >74 women);diabetes mellitus;dyslipidemia;hypertension;obesity (BMI >30kg/m2)     Objective:    Today's Vitals   03/12/24 1543  Weight: 204 lb (92.5 kg)  Height: 5\' 6"  (1.676 m)  PainSc: 3    Body mass index is 32.93 kg/m.     03/12/2024    3:50 PM 12/01/2023    9:08 AM 11/25/2023   10:19 AM 03/14/2023    2:29 PM 01/05/2023    1:08 PM 03/11/2021    1:29 PM 02/17/2021    9:19 AM  Advanced Directives  Does Patient Have a Medical Advance Directive? Yes Yes Yes Yes Yes No No;Yes  Type of Estate agent of [place];Living will Healthcare Power of [employer] of [place];Living will Healthcare Power of [place];Living will  Healthcare Power of [place];Living will  Does patient want to make changes to medical advance directive? No - Patient declined No - Patient declined No - Patient declined  No - Patient declined    Copy  of Healthcare Power of Attorney in Chart? Yes - validated most recent copy scanned in chart (See row information) Yes - validated most recent copy scanned in chart (See row information)  Yes - validated most recent copy scanned in chart (See row information) Yes - validated most recent copy scanned in chart (See row information)    Would patient like information on creating a medical advance directive?      No - Patient declined     Current Medications (verified) Outpatient Encounter Medications as of 03/12/2024  Medication Sig   Cyanocobalamin  (VITAMIN B-12 PO) Take 1 tablet by mouth in the morning.   diltiazem  (CARDIZEM  CD) 120 MG 24 hr capsule Take 1 capsule (120 mg total) by mouth every evening.   docusate sodium  (COLACE) 100 MG capsule Take 100 mg by mouth 2 (two) times daily.   doxepin  (SINEQUAN ) 25 MG capsule Take 1 capsule (25 mg total) by mouth at bedtime as needed (sleep).   DULoxetine  (CYMBALTA ) 60 MG capsule TAKE 1 CAPSULE BY MOUTH DAILY   ezetimibe  (ZETIA ) 10 MG tablet Take 1 tablet (10 mg total) by mouth daily.   ferrous sulfate  325 (65 FE) MG tablet Take 325 mg by mouth every evening.   gabapentin  (NEURONTIN ) 800 MG tablet Take 2 tablets (1,600 mg total) by mouth 3 (three) times daily.   glipiZIDE  (GLUCOTROL  XL) 5 MG 24 hr tablet TAKE 1 TABLET BY MOUTH DAILY WITH BREAKFAST   losartan -hydrochlorothiazide  (HYZAAR) 100-25 MG tablet Take 1 tablet by mouth daily.   metFORMIN  (GLUCOPHAGE ) 1000 MG tablet Take  1 tablet (1,000 mg total) by mouth 2 (two) times daily.   Multiple Vitamins-Minerals (MULTIVITAMIN WITH MINERALS) tablet Take 1 tablet by mouth every evening.   nystatin  (MYCOSTATIN /NYSTOP ) powder Apply 1 Application topically 3 (three) times daily.   ondansetron  (ZOFRAN ) 4 MG tablet Take 1 tablet (4 mg total) by mouth every 8 (eight) hours as needed for nausea or vomiting.   pantoprazole  (PROTONIX ) 20 MG tablet Take 1 tablet (20 mg total) by mouth every evening.   Potassium  Chloride ER 20 MEQ TBCR TAKE 1 TABLET BY MOUTH DAILY   No facility-administered encounter medications on file as of 03/12/2024.    Allergies (verified) Ace inhibitors, Codeine, and Flomax [tamsulosin hcl]   History: Past Medical History:  Diagnosis Date   Anxiety    Arthritis    Diverticulitis of intestine without perforation or abscess    Diverticulosis, sigmoid    Dysrhythmia    GERD (gastroesophageal reflux disease)    History of diverticulitis of colon    09/ 2015  RESOLVED    History of kidney stones    Hypertension    Nephrolithiasis    BILATERAL   OSA on CPAP    no longer uses    Right ureteral stone    Seasonal allergic rhinitis 01/24/2018   Type 2 diabetes mellitus (HCC)    type 2    Past Surgical History:  Procedure Laterality Date   ABDOMINAL WOUND DEHISCENCE  2017   wound vac placement,  had exploratory laparotomy   BARIATRIC SURGERY     CATARACT EXTRACTION Bilateral    CERVICAL CONE BIOPSY  1976   COLON RESECTION     colostomy x 6 months   CYSTO/  BILATERAL RETROGRADE PYELOGRAM/  PLACEMENT BILATERAL URETERAL STENTS  07/10/2011   CYSTOSCOPY WITH RETROGRADE PYELOGRAM, URETEROSCOPY AND STENT PLACEMENT Right 10/11/2014   Procedure: CYSTOSCOPY WITH RETROGRADE PYELOGRAM, URETEROSCOPY, right ureteral biopsy, AND STENT PLACEMENT;  Surgeon: Arleen Bells, MD;  Location: [facility];  Service: Urology;  Laterality: Right;   CYSTOSCOPY/RETROGRADE/URETEROSCOPY  10/08/2011   Procedure: CYSTOSCOPY/RETROGRADE/URETEROSCOPY;  Surgeon: Jinny Mounts, MD;  Location: [facility];  Service: Urology;  Laterality: N/A;  CYSTOSCOPY, RIGHT  RETROGRADE, RIGHT URETEROSCOPY HOLMIUM LASER WITH BASKET STONE EXTRACTION AND RIGHT URETERAL STENT PLACEMENT   CYSTOSCOPY/URETEROSCOPY/HOLMIUM LASER/STENT PLACEMENT Right 02/20/2021   Procedure: CYSTOSCOPY RETROGRADE/URETEROSCOPY/HOLMIUM LASER/STENT PLACEMENT/BASKET STONE EXTRACTION;  Surgeon: Lahoma Pigg, MD;   Location: [facility];  Service: Urology;  Laterality: Right;  ONLY NEEDS 30 MIN   EXTRACORPOREAL SHOCK WAVE LITHOTRIPSY  x3  last one 01-24-2012   TOTAL HIP ARTHROPLASTY Right 12/01/2023   Procedure: TOTAL HIP ARTHROPLASTY ANTERIOR APPROACH;  Surgeon: Adonica Hoose, MD;  Location: [facility];  Service: Orthopedics;  Laterality: Right;  130   TUBAL LIGATION  1987   URETEROSCPIC STONE EXTRACTION/ STENT PLACEMENT  left 08-20-2011   Family History  Problem Relation Age of Onset   Breast cancer Mother        67   Renal Disease Mother    Heart disease Mother    Diabetes Paternal Grandmother    Diabetes Maternal Aunt    Social History   Socioeconomic History   Marital status: Widowed    Spouse name: Not on file   Number of children: 3   Years of education: Not on file   Highest education level: Not on file  Occupational History   Occupation: Retired   Tobacco Use   Smoking status: Former    Current packs/day: 0.00  Average packs/day: 1 pack/day for 25.0 years (25.0 ttl pk-yrs)    Types: Cigarettes    Start date: 10/08/1971    Quit date: 10/07/1996    Years since quitting: 27.4   Smokeless tobacco: Never  Vaping Use   Vaping status: Never Used  Substance and Sexual Activity   Alcohol  use: Never   Drug use: No   Sexual activity: Not Currently  Other Topics Concern   Not on file  Social History Narrative   In process of selling home and moving to [place]  with daughter   Right handed   Caffeine 1/2 cup    Social Drivers of Health   Financial Resource Strain: Low Risk  (03/12/2024)   Overall Financial Resource Strain (CARDIA)    Difficulty of Paying Living Expenses: Not hard at all  Food Insecurity: No Food Insecurity (03/12/2024)   Hunger Vital Sign    Worried About Running Out of Food in the Last Year: Never true    Ran Out of Food in the Last Year: Never true  Transportation Needs: No Transportation Needs (03/12/2024)   PRAPARE - Administrator, Civil Service  (Medical): No    Lack of Transportation (Non-Medical): No  Physical Activity: Insufficiently Active (03/12/2024)   Exercise Vital Sign    Days of Exercise per Week: 7 days    Minutes of Exercise per Session: 10 min  Stress: No Stress Concern Present (03/12/2024)   [employer] of Occupational Health - Occupational Stress Questionnaire    Feeling of Stress : Not at all  Social Connections: Moderately Isolated (03/12/2024)   Social Connection and Isolation Panel [NHANES]    Frequency of Communication with Friends and Family: More than three times a week    Frequency of Social Gatherings with Friends and Family: More than three times a week    Attends Religious Services: More than 4 times per year    Active Member of [employer] or Organizations: No    Attends Banker Meetings: Never    Marital Status: Widowed    Tobacco Counseling Counseling given: Not Answered    Clinical Intake:  Pre-visit preparation completed: Yes  Pain : 0-10 Pain Score: 3  Pain Type: Chronic pain Pain Location: Hip Pain Orientation: Left Pain Descriptors / Indicators: Aching Pain Onset: More than a month ago Pain Frequency: Intermittent     BMI - recorded: 32.93 Nutritional Status: BMI > 30  Obese Nutritional Risks: None Diabetes: Yes CBG done?: No Did pt. bring in CBG monitor from home?: No  Lab Results  Component Value Date   HGBA1C 6.2 (A) 01/04/2024   HGBA1C 6.2 (H) 11/25/2023   HGBA1C 5.9 (A) 06/23/2023     How often do you need to have someone help you when you read instructions, pamphlets, or other written materials from your doctor or pharmacy?: 1 - Never  Interpreter Needed?: No  Information entered by :: Lamont Pilsner, LPN   Activities of Daily Living      03/12/2024    3:45 PM 12/01/2023    6:00 PM  In your present state of health, do you have any difficulty performing the following activities:  Hearing? 0 0  Vision? 0 0  Difficulty concentrating or making  decisions? 0 1  Walking or climbing stairs? 0   Comment has chair lift   Dressing or bathing? 0   Doing errands, shopping? 0 0  Preparing Food and eating ? N   Using the Toilet? N   In  the past six months, have you accidently leaked urine? N   Do you have problems with loss of bowel control? N   Managing your Medications? N   Managing your Finances? N   Housekeeping or managing your Housekeeping? N     Patient Care Team: Luevenia Saha, MD as PCP - General (Family Medicine) Dasher, Royston Cornea, MD (Surgery) Lavoie, Marie-Lyne, MD as Consulting Physician (Obstetrics and Gynecology) [place] [facility]) Gaynelle Keeling, MD as Consulting Physician (Dermatology) Roche, Lacinda Pica, PA-C as Physician Assistant (Pain Medicine) Myrle [facility] (Inactive) (Pharmacist) Lahoma Pigg, MD as Consulting Physician (Urology)  Indicate any recent Medical Services you may have received from other than [facility] providers in the past year (date may be approximate).     Assessment:   This is a routine wellness examination for [place].  Hearing/Vision screen Hearing Screening - Comments:: Pt denies any hearing issues  Vision Screening - Comments:: Wears rx glasses - up to date with routine eye exams with Dr Selby Dage     Goals Addressed             This Visit's Progress    Patient Stated       Walk more        Depression Screen      03/12/2024    3:48 PM 01/04/2024    2:54 PM 06/23/2023   11:25 AM 03/24/2023   11:43 AM 01/05/2023    1:06 PM 11/03/2022    2:28 PM 08/16/2022    9:43 AM  PHQ 2/9 Scores  PHQ - 2 Score 0 0 0 0 0 0 0    Fall Risk     03/12/2024    3:50 PM 01/04/2024    2:54 PM 06/23/2023   11:23 AM 03/24/2023   11:43 AM 01/05/2023    1:08 PM  Fall Risk   Falls in the past year? 0 0 0 0 0  Number falls in past yr: 0 0 0 0 0  Injury with Fall? 0 0 0 0 0  Risk for fall due to : Impaired mobility;Impaired balance/gait No Fall Risks No Fall Risks No Fall Risks  Impaired balance/gait;Impaired vision  Follow up Falls prevention discussed Falls evaluation completed Falls evaluation completed Falls evaluation completed Falls prevention discussed    MEDICARE RISK AT HOME:   Medicare Risk at Home Any stairs in or around the home?: Yes If so, are there any without handrails?: No Home free of loose throw rugs in walkways, pet beds, electrical cords, etc?: Yes Adequate lighting in your home to reduce risk of falls?: Yes Life alert?: No Use of a cane, walker or w/c?: Yes Grab bars in the bathroom?: Yes Shower chair or bench in shower?: Yes Elevated toilet seat or a handicapped toilet?: Yes  TIMED UP AND GO:  Was the test performed?  No  Cognitive Function: 6CIT completed    12/07/2018   10:23 AM 12/01/2017    1:31 PM  MMSE - Mini Mental State Exam  Orientation to time 5 5  Orientation to Place 5 5  Registration 3 3  Attention/ Calculation 5 5  Recall 3 3  Language- name 2 objects 2 2  Language- repeat 1 1  Language- follow 3 step command 3 3  Language- read & follow direction 1 1  Write a sentence 1 1  Copy design 1 1  Total score 30 30        03/12/2024    3:51 PM 01/05/2023  1:10 PM 12/17/2021    3:20 PM 12/11/2020    3:32 PM 12/10/2019    3:31 PM  6CIT Screen  What Year? 0 points 0 points 0 points 0 points 0 points  What month? 0 points 0 points 0 points 0 points 0 points  What time? 0 points 0 points 0 points  0 points  Count back from 20 0 points 0 points 0 points 0 points 0 points  Months in reverse 0 points 0 points 0 points 0 points 0 points  Repeat phrase 0 points 0 points 0 points 0 points 0 points  Total Score 0 points 0 points 0 points  0 points    Immunizations Immunization History  Administered Date(s) Administered   Fluad Quad(high Dose 65+) 08/27/2019, 09/22/2021, 08/16/2022   Influenza, High Dose Seasonal PF 08/19/2014, 09/02/2015, 10/08/2016, 09/22/2017, 08/29/2018   Influenza-Unspecified 10/13/2020    PFIZER(Purple Top)SARS-COV-2 Vaccination 12/29/2019, 01/18/2020, 10/13/2020   Pneumococcal Conjugate-13 10/18/2016   Pneumococcal Polysaccharide-23 08/19/2014    Screening Tests Health Maintenance  Topic Date Due   COVID-19 Vaccine (4 - 2024-25 season) 07/24/2023   Diabetic kidney evaluation - Urine ACR  03/23/2024   FOOT EXAM  03/23/2024   OPHTHALMOLOGY EXAM  04/11/2024   MAMMOGRAM  06/16/2024   INFLUENZA VACCINE  06/22/2024   HEMOGLOBIN A1C  07/03/2024   Diabetic kidney evaluation - eGFR measurement  12/01/2024   Medicare Annual Wellness (AWV)  03/12/2025   DEXA SCAN  06/16/2025   Colonoscopy  06/28/2028   Pneumonia Vaccine 71+ Years old  Completed   HPV VACCINES  Aged Out   Meningococcal B Vaccine  Aged Out   DTaP/Tdap/Td  Discontinued   Hepatitis C Screening  Discontinued   Zoster Vaccines- Shingrix  Discontinued    Health Maintenance  Health Maintenance Due  Topic Date Due   COVID-19 Vaccine (4 - 2024-25 season) 07/24/2023   Diabetic kidney evaluation - Urine ACR  03/23/2024   Health Maintenance Items Addressed: See Nurse Notes  Additional Screening:  Vision Screening: Recommended annual ophthalmology exams for early detection of glaucoma and other disorders of the eye.  Dental Screening: Recommended annual dental exams for proper oral hygiene  Community Resource Referral / Chronic Care Management: CRR required this visit?  No   CCM required this visit?  No     Plan:     I have personally reviewed and noted the following in the patient's chart:   Medical and social history Use of alcohol , tobacco or illicit drugs  Current medications and supplements including opioid prescriptions. Patient is not currently taking opioid prescriptions. Functional ability and status Nutritional status Physical activity Advanced directives List of other physicians Hospitalizations, surgeries, and ER visits in previous 12 months Vitals Screenings to include cognitive,  depression, and falls Referrals and appointments  In addition, I have reviewed and discussed with patient certain preventive protocols, quality metrics, and best practice recommendations. A written personalized care plan for preventive services as well as general preventive health recommendations were provided to patient.     Bruno Capri, LPN   1/61/0960   After Visit Summary: (MyChart) Due to this being a telephonic visit, the after visit summary with patients personalized plan was offered to patient via MyChart   Notes: Nothing significant to report at this time.

## 2024-03-12 NOTE — Patient Instructions (Signed)
 Marilyn Myers , Thank you for taking time to come for your Medicare Wellness Visit. I appreciate your ongoing commitment to your health goals. Please review the following plan we discussed and let me know if I can assist you in the future.   Referrals/Orders/Follow-Ups/Clinician Recommendations: continue to walk more   This is a list of the screening recommended for you and due dates:  Health Maintenance  Topic Date Due   COVID-19 Vaccine (4 - 2024-25 season) 07/24/2023   Yearly kidney health urinalysis for diabetes  03/23/2024   Complete foot exam   03/23/2024   Eye exam for diabetics  04/11/2024   Mammogram  06/16/2024   Flu Shot  06/22/2024   Hemoglobin A1C  07/03/2024   Yearly kidney function blood test for diabetes  12/01/2024   Medicare Annual Wellness Visit  03/12/2025   DEXA scan (bone density measurement)  06/16/2025   Colon Cancer Screening  06/28/2028   Pneumonia Vaccine  Completed   HPV Vaccine  Aged Out   Meningitis B Vaccine  Aged Out   DTaP/Tdap/Td vaccine  Discontinued   Hepatitis C Screening  Discontinued   Zoster (Shingles) Vaccine  Discontinued    Advanced directives: (In Chart) A copy of your advanced directives are scanned into your chart should your provider ever need it.  Next Medicare Annual Wellness Visit scheduled for next year: Yes

## 2024-04-05 ENCOUNTER — Other Ambulatory Visit: Payer: Self-pay | Admitting: Family Medicine

## 2024-04-11 ENCOUNTER — Ambulatory Visit: Payer: Medicare Other | Admitting: Family Medicine

## 2024-04-13 ENCOUNTER — Ambulatory Visit (INDEPENDENT_AMBULATORY_CARE_PROVIDER_SITE_OTHER): Admitting: Family Medicine

## 2024-04-13 ENCOUNTER — Encounter: Payer: Self-pay | Admitting: Family Medicine

## 2024-04-13 VITALS — BP 130/85 | HR 83 | Temp 97.7°F | Ht 66.0 in | Wt 201.8 lb

## 2024-04-13 DIAGNOSIS — E782 Mixed hyperlipidemia: Secondary | ICD-10-CM

## 2024-04-13 DIAGNOSIS — F339 Major depressive disorder, recurrent, unspecified: Secondary | ICD-10-CM

## 2024-04-13 DIAGNOSIS — E1142 Type 2 diabetes mellitus with diabetic polyneuropathy: Secondary | ICD-10-CM | POA: Diagnosis not present

## 2024-04-13 DIAGNOSIS — I152 Hypertension secondary to endocrine disorders: Secondary | ICD-10-CM

## 2024-04-13 DIAGNOSIS — G4709 Other insomnia: Secondary | ICD-10-CM

## 2024-04-13 DIAGNOSIS — G4733 Obstructive sleep apnea (adult) (pediatric): Secondary | ICD-10-CM

## 2024-04-13 DIAGNOSIS — K219 Gastro-esophageal reflux disease without esophagitis: Secondary | ICD-10-CM

## 2024-04-13 DIAGNOSIS — M85852 Other specified disorders of bone density and structure, left thigh: Secondary | ICD-10-CM

## 2024-04-13 DIAGNOSIS — Z5320 Procedure and treatment not carried out because of patient's decision for unspecified reasons: Secondary | ICD-10-CM | POA: Diagnosis not present

## 2024-04-13 DIAGNOSIS — E1169 Type 2 diabetes mellitus with other specified complication: Secondary | ICD-10-CM | POA: Diagnosis not present

## 2024-04-13 DIAGNOSIS — Z7984 Long term (current) use of oral hypoglycemic drugs: Secondary | ICD-10-CM

## 2024-04-13 DIAGNOSIS — E876 Hypokalemia: Secondary | ICD-10-CM | POA: Insufficient documentation

## 2024-04-13 DIAGNOSIS — E1159 Type 2 diabetes mellitus with other circulatory complications: Secondary | ICD-10-CM | POA: Diagnosis not present

## 2024-04-13 LAB — CBC WITH DIFFERENTIAL/PLATELET
Basophils Absolute: 0 10*3/uL (ref 0.0–0.1)
Basophils Relative: 0.4 % (ref 0.0–3.0)
Eosinophils Absolute: 0.2 10*3/uL (ref 0.0–0.7)
Eosinophils Relative: 3.9 % (ref 0.0–5.0)
HCT: 36.9 % (ref 36.0–46.0)
Hemoglobin: 12.2 g/dL (ref 12.0–15.0)
Lymphocytes Relative: 21.3 % (ref 12.0–46.0)
Lymphs Abs: 1.2 10*3/uL (ref 0.7–4.0)
MCHC: 33.1 g/dL (ref 30.0–36.0)
MCV: 90.2 fl (ref 78.0–100.0)
Monocytes Absolute: 0.6 10*3/uL (ref 0.1–1.0)
Monocytes Relative: 10.9 % (ref 3.0–12.0)
Neutro Abs: 3.5 10*3/uL (ref 1.4–7.7)
Neutrophils Relative %: 63.5 % (ref 43.0–77.0)
Platelets: 305 10*3/uL (ref 150.0–400.0)
RBC: 4.09 Mil/uL (ref 3.87–5.11)
RDW: 16.2 % — ABNORMAL HIGH (ref 11.5–15.5)
WBC: 5.5 10*3/uL (ref 4.0–10.5)

## 2024-04-13 LAB — HEMOGLOBIN A1C: Hgb A1c MFr Bld: 6.6 % — ABNORMAL HIGH (ref 4.6–6.5)

## 2024-04-13 LAB — COMPREHENSIVE METABOLIC PANEL WITH GFR
ALT: 111 U/L — ABNORMAL HIGH (ref 0–35)
AST: 66 U/L — ABNORMAL HIGH (ref 0–37)
Albumin: 4 g/dL (ref 3.5–5.2)
Alkaline Phosphatase: 230 U/L — ABNORMAL HIGH (ref 39–117)
BUN: 23 mg/dL (ref 6–23)
CO2: 29 meq/L (ref 19–32)
Calcium: 9.9 mg/dL (ref 8.4–10.5)
Chloride: 98 meq/L (ref 96–112)
Creatinine, Ser: 1.18 mg/dL (ref 0.40–1.20)
GFR: 45.36 mL/min — ABNORMAL LOW (ref >60.00)
Glucose, Bld: 71 mg/dL (ref 70–99)
Potassium: 3.6 meq/L (ref 3.5–5.1)
Sodium: 137 meq/L (ref 135–145)
Total Bilirubin: 1.6 mg/dL — ABNORMAL HIGH (ref 0.2–1.2)
Total Protein: 7.6 g/dL (ref 6.0–8.3)

## 2024-04-13 LAB — LIPID PANEL
Cholesterol: 222 mg/dL — ABNORMAL HIGH (ref 0–200)
HDL: 62.8 mg/dL (ref >39.00)
LDL Cholesterol: 131 mg/dL — ABNORMAL HIGH (ref 0–99)
NonHDL: 158.8
Total CHOL/HDL Ratio: 4
Triglycerides: 137 mg/dL (ref 0.0–149.0)
VLDL: 27.4 mg/dL (ref 0.0–40.0)

## 2024-04-13 LAB — MICROALBUMIN / CREATININE URINE RATIO
Creatinine,U: 125.2 mg/dL
Microalb Creat Ratio: 120.1 mg/g — ABNORMAL HIGH (ref 0.0–30.0)
Microalb, Ur: 15 mg/dL — ABNORMAL HIGH (ref 0.0–1.9)

## 2024-04-13 LAB — TSH: TSH: 2.2 u[IU]/mL (ref 0.35–5.50)

## 2024-04-13 MED ORDER — PANTOPRAZOLE SODIUM 20 MG PO TBEC: 20.0000 mg | DELAYED_RELEASE_TABLET | Freq: Every evening | ORAL | 3 refills | Status: AC

## 2024-04-13 NOTE — Patient Instructions (Signed)
 Please return in 6 months to recheck diabetes and blood pressure For follow up on chronic medical conditions   I will release your lab results to you on your MyChart account with further instructions. You may see the results before I do, but when I review them I will send you a message with my report or have my assistant call you if things need to be discussed. Please reply to my message with any questions. Thank you!   If you have any questions or concerns, please don't hesitate to send me a message via MyChart or call the office at [phone]. Thank you for visiting with us  today! It's our pleasure caring for you.    VISIT SUMMARY: Today, you came in for a pre-operative clearance for your upcoming hip surgery. We discussed your current health status, including your diabetes, hypertension, and general health maintenance. You reported no chest pain or shortness of breath, and your blood pressure was slightly elevated, likely due to almost falling on your way to the appointment. We also talked about your concerns regarding mammograms and your need for an eye exam.  YOUR PLAN: -OSTEOARTHRITIS OF HIP: Osteoarthritis of the hip is a condition where the cartilage in the hip joint wears down over time, causing pain and stiffness. You are awaiting surgical clearance for your upcoming hip surgery. We will coordinate with Dr. Swintec to ensure the clearance form is sent.  -TYPE 2 DIABETES MELLITUS: Type 2 diabetes mellitus is a condition where your body does not use insulin  properly, leading to high blood sugar levels. We will order an A1c test to help with your surgical clearance.  -HYPERTENSION: Hypertension, or high blood pressure, is a condition where the force of the blood against your artery walls is too high. Your blood pressure is well-controlled with your current medications: diltiazem , losartan , and hydrochlorothiazide . Please continue taking these medications as prescribed.  -GENERAL HEALTH  MAINTENANCE: We discussed your decision to stop mammograms due to concerns about radiation exposure. I provided education on the increased risk of breast cancer until age 18 and the importance of early detection. I encourage you to reconsider mammogram screening. Additionally, you need an eye exam, so please contact Dr. Bronson Canny.  INSTRUCTIONS: Please follow up with Dr. Swintec for your surgical clearance. Make sure to get your A1c test done as soon as possible. Contact Dr. Bronson Canny to schedule your eye exam. Consider the information provided about the importance of continuing mammogram screenings.                      Contains text generated by [employer].                                 Contains text generated by [employer]bridge.

## 2024-04-13 NOTE — Progress Notes (Signed)
 Subjective  CC:  Chief Complaint  Patient presents with   Annual Exam    Pt here for Annual exam and is currently fasting. Eye exam has not been scheduled yet   Diabetes    HPI: Marilyn Myers is a 73 y.o. female who presents to the office today to address the problems listed above in the chief complaint. Discussed the use of AI scribe software for clinical note transcription with the patient, who gave verbal consent to proceed.  History of Present Illness Marilyn Myers "Marilyn Myers" is a 73 year old female with diabetes and hypertension who presents for pre-operative clearance for hip surgery.  She is preparing for hip surgery on her problematic hip, following a previous hip replacement in January. The surgery is pending medical clearance.  She has no chest pain or shortness of breath. Her blood pressure was slightly elevated today, which she attributes to almost falling on her way to the appointment. She does not regularly monitor her blood pressure at home.  She has diabetes and is ready for an A1c test today. Her current medications include diltiazem , losartan , hydrochlorothiazide , and ezetimibe . She continues to refuse statins.  She wants to stop mammograms due to concerns about radiation exposure, although previous mammograms have been clear. Her bone density is up to date, and she needs an eye exam, for which she sees Dr. Bronson Canny.  We discussed follow-up for her chronic problems including diabetes, hypertension, GERD, hyperlipidemia, depression, sleep apnea with secondary insomnia.  She reports she is doing well overall.  Taking all her medication without side effects.  As above, no chest pain or shortness of breath.  Her chronic peripheral neuropathy is managed well with high-dose gabapentin .  Sleep is managed with doxepin .  She is fasting for blood work today.  Mood is stable.   Assessment  1. Controlled type 2 diabetes mellitus with diabetic polyneuropathy, without long-term  current use of insulin  (HCC)   2. Gastroesophageal reflux disease without esophagitis   3. Diabetic peripheral neuropathy (HCC)   4. Hypertension associated with diabetes (HCC)   5. Major depression, recurrent, chronic (HCC)   6. Obstructive sleep apnea   7. Combined hyperlipidemia associated with type 2 diabetes mellitus (HCC)   8. Osteopenia of left hip   9. Secondary insomnia   10. Refusal of statin medication by patient      Plan  Assessment and Plan Assessment & Plan Osteoarthritis of hip Osteoarthritis of the hip requiring surgical intervention. Awaiting surgical clearance from Dr. Umberto Ganong. - Coordinate with Dr. Swintec for surgical clearance and ensure clearance form is sent.  Type 2 diabetes mellitus Type 2 diabetes mellitus, preparing for hip surgery and requires A1c testing for surgical clearance. - Order A1c test for surgical clearance. - Urine nephropathy screen today - Check electrolytes and kidney function - Continue metformin  and Glucotrol  XL.  Need good control for upcoming surgery.  Cost has been a barrier to other types of medication.  Hypertension Hypertension, well-controlled with blood pressure at 120/78 mmHg. On diltiazem , losartan , and HCTZ. - Continue current antihypertensive medications: diltiazem , losartan , HCTZ. - Check electrolytes and lipids  Recheck lipids on statin and Zetia .  Monitor LFTs  Continue mood medicines, gabapentin  for neuropathy, Sinequan  for insomnia.  Continue CPAP  General Health Maintenance Refusing further mammogram and bone density screenings. Education provided on increased breast cancer risks until age 76 and the importance of early detection. - Encourage reconsideration of mammogram screening. - Eye exam needed; contact Dr. Donata Fryer  Don.    Follow up: 6 months for diabetes hypertension and follow-up chronic problems Orders Placed This Encounter  Procedures   CBC with Differential/Platelet   Comprehensive metabolic panel  with GFR   Lipid panel   Hemoglobin A1c   TSH   Microalbumin / creatinine urine ratio   Meds ordered this encounter  Medications   pantoprazole  (PROTONIX ) 20 MG tablet    Sig: Take 1 tablet (20 mg total) by mouth every evening.    Dispense:  90 tablet    Refill:  3     I reviewed the patients updated PMH, FH, and SocHx.  Patient Active Problem List   Diagnosis Date Noted   Combined hyperlipidemia associated with type 2 diabetes mellitus (HCC) 05/12/2022    Priority: High   Major depression, recurrent, chronic (HCC)     Priority: High   History of suicide attempt - 2022 opiod overdose     Priority: High   Obesity (BMI 30-39.9) 02/22/2019    Priority: High   Diabetic peripheral neuropathy (HCC) 09/28/2016    Priority: High   Hypertension associated with diabetes (HCC) 01/28/2015    Priority: High   Obstructive sleep apnea 08/01/2014    Priority: High   Controlled type 2 diabetes mellitus with diabetic polyneuropathy, without long-term current use of insulin  (HCC) 06/10/2014    Priority: High   Secondary insomnia 06/02/2022    Priority: Medium    Gastroesophageal reflux disease without esophagitis 01/07/2020    Priority: Medium    Osteopenia 05/19/2015    Priority: Medium    Diverticulitis of large intestine 08/19/2014    Priority: Medium    Nephrolithiasis 08/19/2014    Priority: Medium    Refusal of statin medication by patient 01/24/2018    Priority: Low   Seasonal allergic rhinitis 01/24/2018    Priority: Low   PVC's (premature ventricular contractions) 10/08/2016    Priority: Low   S/P colostomy takedown 07/02/2015    Priority: Low   Hydradenitis 08/19/2014    Priority: Low   Status post hip replacement, right 01/04/2024   Osteoarthritis of right hip 12/01/2023   S/P total right hip arthroplasty 12/01/2023   Current Meds  Medication Sig   Cyanocobalamin  (VITAMIN B-12 PO) Take 1 tablet by mouth in the morning.   diltiazem  (CARDIZEM  CD) 120 MG 24 hr capsule  TAKE 1 CAPSULE BY MOUTH DAILY   docusate sodium  (COLACE) 100 MG capsule Take 100 mg by mouth 2 (two) times daily.   doxepin  (SINEQUAN ) 25 MG capsule Take 1 capsule (25 mg total) by mouth at bedtime as needed (sleep).   DULoxetine  (CYMBALTA ) 60 MG capsule TAKE 1 CAPSULE BY MOUTH DAILY   ezetimibe  (ZETIA ) 10 MG tablet Take 1 tablet (10 mg total) by mouth daily.   ferrous sulfate  325 (65 FE) MG tablet Take 325 mg by mouth every evening.   gabapentin  (NEURONTIN ) 800 MG tablet Take 2 tablets (1,600 mg total) by mouth 3 (three) times daily.   glipiZIDE  (GLUCOTROL  XL) 5 MG 24 hr tablet TAKE 1 TABLET BY MOUTH DAILY WITH BREAKFAST   losartan -hydrochlorothiazide  (HYZAAR) 100-25 MG tablet Take 1 tablet by mouth daily.   metFORMIN  (GLUCOPHAGE ) 1000 MG tablet Take 1 tablet (1,000 mg total) by mouth 2 (two) times daily.   Multiple Vitamins-Minerals (MULTIVITAMIN WITH MINERALS) tablet Take 1 tablet by mouth every evening.   nystatin  (MYCOSTATIN /NYSTOP ) powder Apply 1 Application topically 3 (three) times daily.   ondansetron  (ZOFRAN ) 4 MG tablet Take 1 tablet (4 mg  total) by mouth every 8 (eight) hours as needed for nausea or vomiting.   Potassium Chloride  ER 20 MEQ TBCR TAKE 1 TABLET BY MOUTH DAILY   [DISCONTINUED] pantoprazole  (PROTONIX ) 20 MG tablet Take 1 tablet (20 mg total) by mouth every evening.   Allergies: Patient is allergic to ace inhibitors, codeine, and flomax [tamsulosin hcl]. Family History: Patient family history includes Breast cancer in her mother; Diabetes in her maternal aunt and paternal grandmother; Heart disease in her mother; Renal Disease in her mother. Social History:  Patient  reports that she quit smoking about 27 years ago. Her smoking use included cigarettes. She started smoking about 52 years ago. She has a 25 pack-year smoking history. She has never used smokeless tobacco. She reports that she does not drink alcohol  and does not use drugs.  Review of Systems: Constitutional:  Negative for fever malaise or anorexia Cardiovascular: negative for chest pain Respiratory: negative for SOB or persistent cough Gastrointestinal: negative for abdominal pain  Objective  Vitals: BP 130/85   Pulse 83   Temp 97.7 F (36.5 C)   Ht 5\' 6"  (1.676 m)   Wt 201 lb 12.8 oz (91.5 kg)   SpO2 96%   BMI 32.57 kg/m  General: no acute distress , A&Ox3 HEENT: PEERL, conjunctiva normal, neck is supple Cardiovascular:  RRR without murmur or gallop.  Respiratory:  Good breath sounds bilaterally, CTAB with normal respiratory effort Skin:  Warm, no rashes  Diabetic Foot Exam: Appearance - no lesions, ulcers or significant calluses Skin - no sigificant pallor or erythema decreased sensation Pulses - +2 distally bilaterally  Commons side effects, risks, benefits, and alternatives for medications and treatment plan prescribed today were discussed, and the patient expressed understanding of the given instructions. Patient is instructed to call or message via MyChart if he/she has any questions or concerns regarding our treatment plan. No barriers to understanding were identified. We discussed Red Flag symptoms and signs in detail. Patient expressed understanding regarding what to do in case of urgent or emergency type symptoms.  Medication list was reconciled, printed and provided to the patient in AVS. Patient instructions and summary information was reviewed with the patient as documented in the AVS. This note was prepared with assistance of Dragon voice recognition software. Occasional wrong-word or sound-a-like substitutions may have occurred due to the inherent limitations of voice recognition software

## 2024-04-17 ENCOUNTER — Encounter: Payer: Self-pay | Admitting: Family Medicine

## 2024-04-17 ENCOUNTER — Ambulatory Visit: Payer: Self-pay | Admitting: Family Medicine

## 2024-04-17 DIAGNOSIS — K8020 Calculus of gallbladder without cholecystitis without obstruction: Secondary | ICD-10-CM | POA: Insufficient documentation

## 2024-04-17 NOTE — Progress Notes (Signed)
 Please call patient: liver enzymes are all elevated: please ask: is she having any pain in the right upper abdomen with meals?  And does she still have her gallbladder? If having pain, need ruq ultrasound to reevaluate gallbladder and liver.  Hold zetia  for now  Kidney function is slightly worse as well. Stay hydrated.  I recommend recheck in 3 months.  Dr. Jonelle Neri

## 2024-04-18 ENCOUNTER — Other Ambulatory Visit: Payer: Self-pay

## 2024-04-18 DIAGNOSIS — R748 Abnormal levels of other serum enzymes: Secondary | ICD-10-CM

## 2024-04-18 NOTE — Progress Notes (Signed)
Ultrasound has been placed

## 2024-04-25 ENCOUNTER — Telehealth: Payer: Self-pay

## 2024-04-25 NOTE — Telephone Encounter (Signed)
 Copied from CRM [id number]. Topic: General - Other >> Apr 25, 2024 12:52 PM Howard Macho wrote: Reason for CRM: patient called stating MD [facility] (ortho doctor) office is waiting for her clearance form  CB [phone]  Please Advise.  Message has been routed to provider to address

## 2024-04-25 NOTE — Telephone Encounter (Signed)
 Copied from CRM [id number]. Topic: General - Other >> Apr 25, 2024 12:52 PM Howard Macho wrote: Reason for CRM: patient called stating MD [facility] (ortho doctor) office is waiting for her clearance form  CB [phone]  Message has been sent to pt to get clearance form faxed to office

## 2024-04-25 NOTE — Telephone Encounter (Signed)
 Copied from CRM [id number]. Topic: General - Other >> Apr 25, 2024 12:52 PM Howard Macho wrote: Reason for CRM: patient called stating MD [facility] (ortho doctor) office is waiting for her clearance form  CB [phone]

## 2024-04-25 NOTE — Telephone Encounter (Signed)
 Marilyn Myers

## 2024-05-14 ENCOUNTER — Ambulatory Visit: Payer: Self-pay | Admitting: Student

## 2024-05-15 ENCOUNTER — Ambulatory Visit: Payer: Self-pay | Admitting: Student

## 2024-05-15 DIAGNOSIS — E119 Type 2 diabetes mellitus without complications: Secondary | ICD-10-CM

## 2024-05-15 NOTE — H&P (Signed)
 TOTAL HIP ADMISSION H&P  Patient is admitted for left total hip arthroplasty.  Subjective:  Chief Complaint: left hip pain  HPI: Marilyn Myers, 73 y.o. female, has a history of pain and functional disability in the left hip(s) due to arthritis and patient has failed non-surgical conservative treatments for greater than 12 weeks to include NSAID's and/or analgesics, corticosteriod injections, flexibility and strengthening excercises, use of assistive devices, weight reduction as appropriate, and activity modification.  Onset of symptoms was gradual starting 10 years ago with rapidlly worsening course since that time.The patient noted no past surgery on the left hip(s).  Patient currently rates pain in the left hip at 10 out of 10 with activity. Patient has night pain, worsening of pain with activity and weight bearing, trendelenberg gait, pain that interfers with activities of daily living, and pain with passive range of motion. Patient has evidence of subchondral cysts, subchondral sclerosis, periarticular osteophytes, and joint space narrowing by imaging studies. This condition presents safety issues increasing the risk of falls.  There is no current active infection.  Patient Active Problem List   Diagnosis Date Noted   Cholelithiasis 04/17/2024   Diuretic-induced hypokalemia 04/13/2024   Status post hip replacement, right 01/04/2024   Osteoarthritis of right hip 12/01/2023   S/P total right hip arthroplasty 12/01/2023   Secondary insomnia 06/02/2022   Combined hyperlipidemia associated with type 2 diabetes mellitus (HCC) 05/12/2022   Major depression, recurrent, chronic (HCC)    History of suicide attempt - 2022 opiod overdose    Gastroesophageal reflux disease without esophagitis 01/07/2020   Obesity (BMI 30-39.9) 02/22/2019   Refusal of statin medication by patient 01/24/2018   Seasonal allergic rhinitis 01/24/2018   PVC's (premature ventricular contractions) 10/08/2016   Diabetic  peripheral neuropathy (HCC) 09/28/2016   S/P colostomy takedown 07/02/2015   Osteopenia 05/19/2015   Hypertension associated with diabetes (HCC) 01/28/2015   Diverticulitis of large intestine 08/19/2014   Hydradenitis 08/19/2014   Nephrolithiasis 08/19/2014   Obstructive sleep apnea 08/01/2014   Controlled type 2 diabetes mellitus with diabetic polyneuropathy, without long-term current use of insulin  (HCC) 06/10/2014   Past Medical History:  Diagnosis Date   Anemia    Anxiety    Arthritis    Diverticulitis of intestine without perforation or abscess    Diverticulosis, sigmoid    Dysrhythmia    Elevated LFTs    GERD (gastroesophageal reflux disease)    History of diverticulitis of colon    09/ 2015  RESOLVED    History of kidney stones    Hypertension    Nephrolithiasis    BILATERAL   OSA   no CPAP    no longer uses CPAP   Right ureteral stone    Seasonal allergic rhinitis 01/24/2018   Type 2 diabetes mellitus (HCC)    type 2     Past Surgical History:  Procedure Laterality Date   ABDOMINAL WOUND DEHISCENCE  2017   wound vac placement,  had exploratory laparotomy   BARIATRIC SURGERY     CATARACT EXTRACTION Bilateral    CERVICAL CONE BIOPSY  1976   COLON RESECTION     colostomy x 6 months   CYSTO/  BILATERAL RETROGRADE PYELOGRAM/  PLACEMENT BILATERAL URETERAL STENTS  07/10/2011   CYSTOSCOPY WITH RETROGRADE PYELOGRAM, URETEROSCOPY AND STENT PLACEMENT Right 10/11/2014   Procedure: CYSTOSCOPY WITH RETROGRADE PYELOGRAM, URETEROSCOPY, right ureteral biopsy, AND STENT PLACEMENT;  Surgeon: Oliva VEAR Oiler, MD;  Location: [facility] [place];  Service: Urology;  Laterality: Right;  CYSTOSCOPY/RETROGRADE/URETEROSCOPY  10/08/2011   Procedure: CYSTOSCOPY/RETROGRADE/URETEROSCOPY;  Surgeon: Thomasine Oiler, MD;  Location: [facility];  Service: Urology;  Laterality: N/A;  CYSTOSCOPY, RIGHT  RETROGRADE, RIGHT URETEROSCOPY HOLMIUM LASER WITH BASKET STONE EXTRACTION AND  RIGHT URETERAL STENT PLACEMENT   CYSTOSCOPY/URETEROSCOPY/HOLMIUM LASER/STENT PLACEMENT Right 02/20/2021   Procedure: CYSTOSCOPY RETROGRADE/URETEROSCOPY/HOLMIUM LASER/STENT PLACEMENT/BASKET STONE EXTRACTION;  Surgeon: Selma Donnice SAUNDERS, MD;  Location: [facility];  Service: Urology;  Laterality: Right;  ONLY NEEDS 30 MIN   EXTRACORPOREAL SHOCK WAVE LITHOTRIPSY  x3  last one 01-24-2012   TOTAL HIP ARTHROPLASTY Right 12/01/2023   Procedure: TOTAL HIP ARTHROPLASTY ANTERIOR APPROACH;  Surgeon: Fidel Rogue, MD;  Location: [facility];  Service: Orthopedics;  Laterality: Right;  130   TUBAL LIGATION  1987   URETEROSCPIC STONE EXTRACTION/ STENT PLACEMENT  left 08-20-2011    Current Outpatient Medications  Medication Sig Dispense Refill Last Dose/Taking   Cyanocobalamin  (VITAMIN B-12 PO) Take 1 tablet by mouth in the morning.      diltiazem  (CARDIZEM  CD) 120 MG 24 hr capsule TAKE 1 CAPSULE BY MOUTH DAILY (Patient taking differently: Take 120 mg by mouth at bedtime.) 90 capsule 3    docusate sodium  (COLACE) 100 MG capsule Take 100 mg by mouth 2 (two) times daily.      doxepin  (SINEQUAN ) 25 MG capsule Take 1 capsule (25 mg total) by mouth at bedtime as needed (sleep). 90 capsule 3    DULoxetine  (CYMBALTA ) 60 MG capsule TAKE 1 CAPSULE BY MOUTH DAILY 90 capsule 3    ezetimibe  (ZETIA ) 10 MG tablet Take 1 tablet (10 mg total) by mouth daily. (Patient taking differently: Take 10 mg by mouth every evening.) 90 tablet 3    ferrous sulfate  325 (65 FE) MG tablet Take 325 mg by mouth every evening.      gabapentin  (NEURONTIN ) 800 MG tablet Take 2 tablets (1,600 mg total) by mouth 3 (three) times daily.      glipiZIDE  (GLUCOTROL  XL) 5 MG 24 hr tablet TAKE 1 TABLET BY MOUTH DAILY WITH BREAKFAST 90 tablet 3    ivermectin (STROMECTOL) 3 MG TABS tablet Take 30 mg by mouth 2 (two) times a week. Wednesday and Sunday      losartan -hydrochlorothiazide  (HYZAAR) 100-25 MG tablet Take 1 tablet by mouth daily. 90 tablet 3    MAGNESIUM PO  Take 1 tablet by mouth daily. Magnesium with vitamin D       metFORMIN  (GLUCOPHAGE ) 1000 MG tablet Take 1 tablet (1,000 mg total) by mouth 2 (two) times daily. 180 tablet 3    Multiple Vitamins-Minerals (MULTIVITAMIN WITH MINERALS) tablet Take 1 tablet by mouth daily.      nystatin  (MYCOSTATIN /NYSTOP ) powder Apply 1 Application topically 3 (three) times daily. (Patient taking differently: Apply 1 Application topically daily as needed (rash).) 15 g 0    ondansetron  (ZOFRAN ) 4 MG tablet Take 1 tablet (4 mg total) by mouth every 8 (eight) hours as needed for nausea or vomiting. (Patient not taking: Reported on 05/17/2024) 30 tablet 0    pantoprazole  (PROTONIX ) 20 MG tablet Take 1 tablet (20 mg total) by mouth every evening. 90 tablet 3    Potassium Chloride  ER 20 MEQ TBCR TAKE 1 TABLET BY MOUTH DAILY 90 tablet 3    No current facility-administered medications for this visit.   Allergies  Allergen Reactions   Ace Inhibitors Cough   Codeine Nausea And Vomiting   Flomax [Tamsulosin Hcl] Itching    Social History   Tobacco Use   Smoking status:  Former    Current packs/day: 0.00    Average packs/day: 1 pack/day for 25.0 years (25.0 ttl pk-yrs)    Types: Cigarettes    Start date: 10/08/1971    Quit date: 10/07/1996    Years since quitting: 27.6   Smokeless tobacco: Never  Substance Use Topics   Alcohol  use: Never    Family History  Problem Relation Age of Onset   Breast cancer Mother        44   Renal Disease Mother    Heart disease Mother    Diabetes Paternal Grandmother    Diabetes Maternal Aunt      Review of Systems  Musculoskeletal:  Positive for arthralgias and gait problem.  All other systems reviewed and are negative.   Objective:  Physical Exam Constitutional:      Appearance: Normal appearance.  HENT:     Head: Normocephalic and atraumatic.     Nose: Nose normal.     Mouth/Throat:     Mouth: Mucous membranes are moist.     Pharynx: Oropharynx is clear.   Eyes:      Conjunctiva/sclera: Conjunctivae normal.    Cardiovascular:     Rate and Rhythm: Normal rate and regular rhythm.     Pulses: Normal pulses.     Heart sounds: Normal heart sounds.  Pulmonary:     Effort: Pulmonary effort is normal.     Breath sounds: Normal breath sounds.  Abdominal:     Palpations: Abdomen is soft.  Genitourinary:    Comments: Deferred.  Musculoskeletal:     Cervical back: Normal range of motion and neck supple.     Comments: Examination of the left hip reveals no skin wounds or lesions. Pain with flexion and rotation of the hip. Mild motion restriction. Trochanteric tenderness to palpation. She has significant tenderness to palpation down the IT band to the knee. Pannicular fold clear.  Distally, there is no focal motor or sensory deficit. She has subjective sensory change consistent with diabetic neuropathy. She has palpable pedal pulses  No significant pedal edema. Calves soft and non-tender.   Skin:    General: Skin is warm and dry.     Capillary Refill: Capillary refill takes less than 2 seconds.   Neurological:     General: No focal deficit present.     Mental Status: She is alert and oriented to person, place, and time.   Psychiatric:        Mood and Affect: Mood normal.        Behavior: Behavior normal.        Thought Content: Thought content normal.        Judgment: Judgment normal.     Vital signs in last 24 hours: @VSRANGES @  Labs:   Estimated body mass index is 32.28 kg/m as calculated from the following:   Height as of 05/17/24: 5' 6 (1.676 m).   Weight as of 05/17/24: 90.7 kg.   Imaging Review Plain radiographs demonstrate severe degenerative joint disease of the left hip(s). The bone quality appears to be adequate for age and reported activity level.      Assessment/Plan:  End stage arthritis, left hip(s)  The patient history, physical examination, clinical judgement of the provider and imaging studies are consistent  with end stage degenerative joint disease of the left hip(s) and total hip arthroplasty is deemed medically necessary. The treatment options including medical management, injection therapy, arthroscopy and arthroplasty were discussed at length. The risks and benefits of total  hip arthroplasty were presented and reviewed. The risks due to aseptic loosening, infection, stiffness, dislocation/subluxation,  thromboembolic complications and other imponderables were discussed.  The patient acknowledged the explanation, agreed to proceed with the plan and consent was signed. Patient is being admitted for inpatient treatment for surgery, pain control, PT, OT, prophylactic antibiotics, VTE prophylaxis, progressive ambulation and ADL's and discharge planning.The patient is planning to be discharged home with HEP after an overnight stay.   Therapy Plans: HEP.  Disposition: Home with son Sarie Stall). Planned DVT Prophylaxis: aspirin  81mg  BID.  DME needed: Has rolling walker. No rollator after surgery.  PCP: Cleared TXA: IV Allergies:  - ACE inhibitors - cough - Codeine - vomiting - Tamsulosin - hives Anesthesia Concerns: None.  BMI: 32.4 Last HgbA1c: 6.6 Other: - Hx right THA 12/01/23. - T2DM. Diabetic neuropathy. - Hx of OSA, doesn't use CPAP. - Pannicular fold clear.  - History of gastric bypass surgery. NO NSAIDs.  - Hydrocodone , zofran .  - 05/17/24: Hgb 12.5, K+ 3.5, Cr. 1.11.    Patient's anticipated LOS is less than 2 midnights, meeting these requirements: - Younger than 83 - Lives within 1 hour of care - Has a competent adult at home to recover with post-op recover - NO history of  - Chronic pain requiring opiods  - Diabetes  - Coronary Artery Disease  - Heart failure  - Heart attack  - Stroke  - DVT/VTE  - Cardiac arrhythmia  - Respiratory Failure/COPD  - Renal failure  - Anemia  - Advanced Liver disease

## 2024-05-15 NOTE — H&P (View-Only) (Signed)
 TOTAL HIP ADMISSION H&P  Patient is admitted for left total hip arthroplasty.  Subjective:  Chief Complaint: left hip pain  HPI: Marilyn Myers, 73 y.o. female, has a history of pain and functional disability in the left hip(s) due to arthritis and patient has failed non-surgical conservative treatments for greater than 12 weeks to include NSAID's and/or analgesics, corticosteriod injections, flexibility and strengthening excercises, use of assistive devices, weight reduction as appropriate, and activity modification.  Onset of symptoms was gradual starting 10 years ago with rapidlly worsening course since that time.The patient noted no past surgery on the left hip(s).  Patient currently rates pain in the left hip at 10 out of 10 with activity. Patient has night pain, worsening of pain with activity and weight bearing, trendelenberg gait, pain that interfers with activities of daily living, and pain with passive range of motion. Patient has evidence of subchondral cysts, subchondral sclerosis, periarticular osteophytes, and joint space narrowing by imaging studies. This condition presents safety issues increasing the risk of falls.  There is no current active infection.  Patient Active Problem List   Diagnosis Date Noted   Cholelithiasis 04/17/2024   Diuretic-induced hypokalemia 04/13/2024   Status post hip replacement, right 01/04/2024   Osteoarthritis of right hip 12/01/2023   S/P total right hip arthroplasty 12/01/2023   Secondary insomnia 06/02/2022   Combined hyperlipidemia associated with type 2 diabetes mellitus (HCC) 05/12/2022   Major depression, recurrent, chronic (HCC)    History of suicide attempt - 2022 opiod overdose    Gastroesophageal reflux disease without esophagitis 01/07/2020   Obesity (BMI 30-39.9) 02/22/2019   Refusal of statin medication by patient 01/24/2018   Seasonal allergic rhinitis 01/24/2018   PVC's (premature ventricular contractions) 10/08/2016   Diabetic  peripheral neuropathy (HCC) 09/28/2016   S/P colostomy takedown 07/02/2015   Osteopenia 05/19/2015   Hypertension associated with diabetes (HCC) 01/28/2015   Diverticulitis of large intestine 08/19/2014   Hydradenitis 08/19/2014   Nephrolithiasis 08/19/2014   Obstructive sleep apnea 08/01/2014   Controlled type 2 diabetes mellitus with diabetic polyneuropathy, without long-term current use of insulin  (HCC) 06/10/2014   Past Medical History:  Diagnosis Date   Anemia    Anxiety    Arthritis    Diverticulitis of intestine without perforation or abscess    Diverticulosis, sigmoid    Dysrhythmia    Elevated LFTs    GERD (gastroesophageal reflux disease)    History of diverticulitis of colon    09/ 2015  RESOLVED    History of kidney stones    Hypertension    Nephrolithiasis    BILATERAL   OSA   no CPAP    no longer uses CPAP   Right ureteral stone    Seasonal allergic rhinitis 01/24/2018   Type 2 diabetes mellitus (HCC)    type 2     Past Surgical History:  Procedure Laterality Date   ABDOMINAL WOUND DEHISCENCE  2017   wound vac placement,  had exploratory laparotomy   BARIATRIC SURGERY     CATARACT EXTRACTION Bilateral    CERVICAL CONE BIOPSY  1976   COLON RESECTION     colostomy x 6 months   CYSTO/  BILATERAL RETROGRADE PYELOGRAM/  PLACEMENT BILATERAL URETERAL STENTS  07/10/2011   CYSTOSCOPY WITH RETROGRADE PYELOGRAM, URETEROSCOPY AND STENT PLACEMENT Right 10/11/2014   Procedure: CYSTOSCOPY WITH RETROGRADE PYELOGRAM, URETEROSCOPY, right ureteral biopsy, AND STENT PLACEMENT;  Surgeon: Oliva VEAR Oiler, MD;  Location: [facility] [place];  Service: Urology;  Laterality: Right;  CYSTOSCOPY/RETROGRADE/URETEROSCOPY  10/08/2011   Procedure: CYSTOSCOPY/RETROGRADE/URETEROSCOPY;  Surgeon: Thomasine Oiler, MD;  Location: [facility];  Service: Urology;  Laterality: N/A;  CYSTOSCOPY, RIGHT  RETROGRADE, RIGHT URETEROSCOPY HOLMIUM LASER WITH BASKET STONE EXTRACTION AND  RIGHT URETERAL STENT PLACEMENT   CYSTOSCOPY/URETEROSCOPY/HOLMIUM LASER/STENT PLACEMENT Right 02/20/2021   Procedure: CYSTOSCOPY RETROGRADE/URETEROSCOPY/HOLMIUM LASER/STENT PLACEMENT/BASKET STONE EXTRACTION;  Surgeon: Selma Donnice SAUNDERS, MD;  Location: [facility];  Service: Urology;  Laterality: Right;  ONLY NEEDS 30 MIN   EXTRACORPOREAL SHOCK WAVE LITHOTRIPSY  x3  last one 01-24-2012   TOTAL HIP ARTHROPLASTY Right 12/01/2023   Procedure: TOTAL HIP ARTHROPLASTY ANTERIOR APPROACH;  Surgeon: Fidel Rogue, MD;  Location: [facility];  Service: Orthopedics;  Laterality: Right;  130   TUBAL LIGATION  1987   URETEROSCPIC STONE EXTRACTION/ STENT PLACEMENT  left 08-20-2011    Current Outpatient Medications  Medication Sig Dispense Refill Last Dose/Taking   Cyanocobalamin  (VITAMIN B-12 PO) Take 1 tablet by mouth in the morning.      diltiazem  (CARDIZEM  CD) 120 MG 24 hr capsule TAKE 1 CAPSULE BY MOUTH DAILY (Patient taking differently: Take 120 mg by mouth at bedtime.) 90 capsule 3    docusate sodium  (COLACE) 100 MG capsule Take 100 mg by mouth 2 (two) times daily.      doxepin  (SINEQUAN ) 25 MG capsule Take 1 capsule (25 mg total) by mouth at bedtime as needed (sleep). 90 capsule 3    DULoxetine  (CYMBALTA ) 60 MG capsule TAKE 1 CAPSULE BY MOUTH DAILY 90 capsule 3    ezetimibe  (ZETIA ) 10 MG tablet Take 1 tablet (10 mg total) by mouth daily. (Patient taking differently: Take 10 mg by mouth every evening.) 90 tablet 3    ferrous sulfate  325 (65 FE) MG tablet Take 325 mg by mouth every evening.      gabapentin  (NEURONTIN ) 800 MG tablet Take 2 tablets (1,600 mg total) by mouth 3 (three) times daily.      glipiZIDE  (GLUCOTROL  XL) 5 MG 24 hr tablet TAKE 1 TABLET BY MOUTH DAILY WITH BREAKFAST 90 tablet 3    ivermectin (STROMECTOL) 3 MG TABS tablet Take 30 mg by mouth 2 (two) times a week. Wednesday and Sunday      losartan -hydrochlorothiazide  (HYZAAR) 100-25 MG tablet Take 1 tablet by mouth daily. 90 tablet 3    MAGNESIUM PO  Take 1 tablet by mouth daily. Magnesium with vitamin D       metFORMIN  (GLUCOPHAGE ) 1000 MG tablet Take 1 tablet (1,000 mg total) by mouth 2 (two) times daily. 180 tablet 3    Multiple Vitamins-Minerals (MULTIVITAMIN WITH MINERALS) tablet Take 1 tablet by mouth daily.      nystatin  (MYCOSTATIN /NYSTOP ) powder Apply 1 Application topically 3 (three) times daily. (Patient taking differently: Apply 1 Application topically daily as needed (rash).) 15 g 0    ondansetron  (ZOFRAN ) 4 MG tablet Take 1 tablet (4 mg total) by mouth every 8 (eight) hours as needed for nausea or vomiting. (Patient not taking: Reported on 05/17/2024) 30 tablet 0    pantoprazole  (PROTONIX ) 20 MG tablet Take 1 tablet (20 mg total) by mouth every evening. 90 tablet 3    Potassium Chloride  ER 20 MEQ TBCR TAKE 1 TABLET BY MOUTH DAILY 90 tablet 3    No current facility-administered medications for this visit.   Allergies  Allergen Reactions   Ace Inhibitors Cough   Codeine Nausea And Vomiting   Flomax [Tamsulosin Hcl] Itching    Social History   Tobacco Use   Smoking status:  Former    Current packs/day: 0.00    Average packs/day: 1 pack/day for 25.0 years (25.0 ttl pk-yrs)    Types: Cigarettes    Start date: 10/08/1971    Quit date: 10/07/1996    Years since quitting: 27.6   Smokeless tobacco: Never  Substance Use Topics   Alcohol  use: Never    Family History  Problem Relation Age of Onset   Breast cancer Mother        44   Renal Disease Mother    Heart disease Mother    Diabetes Paternal Grandmother    Diabetes Maternal Aunt      Review of Systems  Musculoskeletal:  Positive for arthralgias and gait problem.  All other systems reviewed and are negative.   Objective:  Physical Exam Constitutional:      Appearance: Normal appearance.  HENT:     Head: Normocephalic and atraumatic.     Nose: Nose normal.     Mouth/Throat:     Mouth: Mucous membranes are moist.     Pharynx: Oropharynx is clear.   Eyes:      Conjunctiva/sclera: Conjunctivae normal.    Cardiovascular:     Rate and Rhythm: Normal rate and regular rhythm.     Pulses: Normal pulses.     Heart sounds: Normal heart sounds.  Pulmonary:     Effort: Pulmonary effort is normal.     Breath sounds: Normal breath sounds.  Abdominal:     Palpations: Abdomen is soft.  Genitourinary:    Comments: Deferred.  Musculoskeletal:     Cervical back: Normal range of motion and neck supple.     Comments: Examination of the left hip reveals no skin wounds or lesions. Pain with flexion and rotation of the hip. Mild motion restriction. Trochanteric tenderness to palpation. Marilyn Myers has significant tenderness to palpation down the IT band to the knee. Pannicular fold clear.  Distally, there is no focal motor or sensory deficit. Marilyn Myers has subjective sensory change consistent with diabetic neuropathy. Marilyn Myers has palpable pedal pulses  No significant pedal edema. Calves soft and non-tender.   Skin:    General: Skin is warm and dry.     Capillary Refill: Capillary refill takes less than 2 seconds.   Neurological:     General: No focal deficit present.     Mental Status: Marilyn Myers is alert and oriented to person, place, and time.   Psychiatric:        Mood and Affect: Mood normal.        Behavior: Behavior normal.        Thought Content: Thought content normal.        Judgment: Judgment normal.     Vital signs in last 24 hours: @VSRANGES @  Labs:   Estimated body mass index is 32.28 kg/m as calculated from the following:   Height as of 05/17/24: 5' 6 (1.676 m).   Weight as of 05/17/24: 90.7 kg.   Imaging Review Plain radiographs demonstrate severe degenerative joint disease of the left hip(s). The bone quality appears to be adequate for age and reported activity level.      Assessment/Plan:  End stage arthritis, left hip(s)  The patient history, physical examination, clinical judgement of the provider and imaging studies are consistent  with end stage degenerative joint disease of the left hip(s) and total hip arthroplasty is deemed medically necessary. The treatment options including medical management, injection therapy, arthroscopy and arthroplasty were discussed at length. The risks and benefits of total  hip arthroplasty were presented and reviewed. The risks due to aseptic loosening, infection, stiffness, dislocation/subluxation,  thromboembolic complications and other imponderables were discussed.  The patient acknowledged the explanation, agreed to proceed with the plan and consent was signed. Patient is being admitted for inpatient treatment for surgery, pain control, PT, OT, prophylactic antibiotics, VTE prophylaxis, progressive ambulation and ADL's and discharge planning.The patient is planning to be discharged home with HEP after an overnight stay.   Therapy Plans: HEP.  Disposition: Home with son Sarie Stall). Planned DVT Prophylaxis: aspirin  81mg  BID.  DME needed: Has rolling walker. No rollator after surgery.  PCP: Cleared TXA: IV Allergies:  - ACE inhibitors - cough - Codeine - vomiting - Tamsulosin - hives Anesthesia Concerns: None.  BMI: 32.4 Last HgbA1c: 6.6 Other: - Hx right THA 12/01/23. - T2DM. Diabetic neuropathy. - Hx of OSA, doesn't use CPAP. - Pannicular fold clear.  - History of gastric bypass surgery. NO NSAIDs.  - Hydrocodone , zofran .  - 05/17/24: Hgb 12.5, K+ 3.5, Cr. 1.11.    Patient's anticipated LOS is less than 2 midnights, meeting these requirements: - Younger than 83 - Lives within 1 hour of care - Has a competent adult at home to recover with post-op recover - NO history of  - Chronic pain requiring opiods  - Diabetes  - Coronary Artery Disease  - Heart failure  - Heart attack  - Stroke  - DVT/VTE  - Cardiac arrhythmia  - Respiratory Failure/COPD  - Renal failure  - Anemia  - Advanced Liver disease

## 2024-05-16 NOTE — Progress Notes (Signed)
 COVID Vaccine received:  []  No [x]  Yes Date of any COVID positive Test in last 90 days:  none   PCP - Lavern Heck, MD [phone]  [phone] (Fax)   medical clearance scanned to [facility] - none recently   (09-06-2016 saw [facility]- Duwaine Bailer, NP [place], [place]  for PACs [phone] (Work)  [phone] (Fax)    Chest x-ray - 03-11-2021  1v  Epic EKG -  03-14-2023  Epic  will repeat Stress Test -  ECHO - 2016  Epic Cardiac Cath -     Pacemaker / ICD device [x]  No []  Yes   Spinal Cord Stimulator:[x]  No []  Yes       History of Sleep Apnea? []  No [x]  Yes   CPAP used?- [x]  No []  Yes  had weight loss   Does the patient monitor blood sugar?   []  N/A   [x]  No []  Yes  Patient has: []  NO Hx DM   []  Pre-DM   []  DM1  [x]   DM2 Last A1c was:  6.6   on   04-13-24  METFORMIN - 1000mg  bid,  Hold DOS GLIPIZIDE  - 5 mg q am,   Day before take as usual in the morning.  Hold DOS.    Blood Thinner / Instructions: none Aspirin  Instructions:  none   ERAS Protocol Ordered: []  No  [x]  Yes PRE-SURGERY []  ENSURE  [x]  G2     Patient is to be NPO after: 0430   Dental hx: []  Dentures:  [x]  N/A      []  Bridge or Partial:                   []  Loose or Damaged teeth:    Comments: Patient was given the 5 CHG shower / bath instructions for THA surgery along with 2 bottles of the CHG soap. Patient will start this on:  05-20-2024 All questions were asked and answered, Patient voiced understanding of this process.    Activity level: Can perform activities of daily living without stopping and without symptoms of chest pain or shortness of breath. No stairs due to hip, pt has stair lift    Anesthesia review:? LFTs, DM2, HTN, anemia, GERD, PVCs, OSA- no CPAP since weight loss, s/p gastric sleeve 2018.    Patient denies shortness of breath, fever, cough and chest pain at PAT appointment.   Patient verbalized understanding and agreement to the Pre-Surgical Instructions that were given to them  at this PAT appointment. Patient was also educated of the need to review these PAT instructions again prior to her surgery.I reviewed the appropriate phone numbers to call if they have any and questions or concerns.

## 2024-05-16 NOTE — Patient Instructions (Signed)
 SURGICAL WAITING ROOM VISITATION Patients having surgery or a procedure may have no more than 2 support people in the waiting area - these visitors may rotate in the visitor waiting room.   Due to an increase in RSV and influenza rates and associated hospitalizations, children ages 75 and under may not visit patients in [facility] hospitals. If the patient needs to stay at the hospital during part of their recovery, the visitor guidelines for inpatient rooms apply.   PRE-OP VISITATION  Pre-op nurse will coordinate an appropriate time for 1 support person to accompany the patient in pre-op.  This support person may not rotate.  This visitor will be contacted when the time is appropriate for the visitor to come back in the pre-op area.   Please refer to the [facility] website for the visitor guidelines for Inpatients (after your surgery is over and you are in a regular room).   You are not required to quarantine at this time prior to your surgery. However, you must do this: Hand Hygiene often Do NOT share personal items Notify your provider if you are in close contact with someone who has COVID or you develop fever 100.4 or greater, new onset of sneezing, cough, sore throat, shortness of breath or body aches.  If you test positive for Covid or have been in contact with anyone that has tested positive in the last 10 days please notify you surgeon.     Your procedure is scheduled on:  THURSDAY  May 24, 2024   Report to [facility] Main Entrance: [address] entrance where the [employer] is available.    Report to admitting at: 08:00   AM   Call this number if you have any questions or problems the morning of surgery [phone]   Do not eat food after Midnight the night prior to your surgery/procedure.   After Midnight you may have the following liquids until  07:30 AM DAY OF SURGERY   Clear Liquid Diet Water  Black Coffee (sugar ok, NO MILK/CREAM OR CREAMERS)  Tea (sugar ok,  NO MILK/CREAM OR CREAMERS) regular and decaf                             Plain Jell-O  with no fruit (NO RED)                                           Fruit ices (not with fruit pulp, NO RED)                                     Popsicles (NO RED)                                                                  Juice: NO CITRUS JUICES: only apple, WHITE grape, WHITE cranberry Sports drinks like Gatorade or Powerade (NO RED)                           The day  of surgery:  Drink ONE (1) Pre-Surgery G2 at  07:30 AM the morning of surgery. Drink in one sitting. Do not sip.  This drink was given to you during your hospital pre-op appointment visit. Nothing else to drink after completing the Pre-Surgery G2 : No candy, chewing gum or throat lozenges.     FOLLOW ANY ADDITIONAL PRE OP INSTRUCTIONS YOU RECEIVED FROM YOUR SURGEON'S OFFICE!!!    Oral Hygiene is also important to reduce your risk of infection.        Remember - BRUSH YOUR TEETH THE MORNING OF SURGERY WITH YOUR REGULAR TOOTHPASTE   Do NOT smoke after Midnight the night before surgery.     METFORMIN - 1000mg  bid, Day before surgery take as usual.   DAY OF SURGERY:  DO NOT TAKE METFORMIN    GLIPIZIDE  - 5 mg q am,   Day before take as usual in the morning.  DAY OF SURGERY:  DO NOT TAKE GLIPIZIDE .      STOP TAKING all Vitamins, Herbs and supplements 1 week before your surgery.    Take ONLY these medicines the morning of surgery with A SIP OF WATER : Duloxetine  (Cymbalta ), gabapentin , Diltiazem   DO NOT TAKE LOSARTAN  / HCTZ the morning of your surgery.                     You may not have any metal on your body including hair pins, jewelry, and body piercing   Do not wear make-up, lotions, powders, perfumes or deodorant   Do not wear nail polish including gel and S&S, artificial / acrylic nails, or any other type of covering on natural nails including finger and toenails. If you have artificial nails, gel coating, etc., that needs  to be removed by a nail salon, Please have this removed prior to surgery. Not doing so may mean that your surgery could be cancelled or delayed if the Surgeon or anesthesia staff feels like they are unable to monitor you safely.    Do not shave 48 hours prior to surgery to avoid nicks in your skin which may contribute to postoperative infections.      Contacts, Hearing Aids, dentures or bridgework may not be worn into surgery. DENTURES WILL BE REMOVED PRIOR TO SURGERY PLEASE DO NOT APPLY Poly grip OR ADHESIVES!!!   You may bring a small overnight bag with you on the day of surgery, only pack items that are not valuable. [facility] IS NOT RESPONSIBLE   FOR VALUABLES THAT ARE LOST OR STOLEN.    Do not bring your home medications to the hospital. The Pharmacy will dispense medications listed on your medication list to you during your admission in the Hospital.   Please read over the following fact sheets you were given: IF YOU HAVE QUESTIONS ABOUT YOUR PRE-OP INSTRUCTIONS, PLEASE CALL [phone].        Pre-operative 5 CHG Bath Instructions    You can play a key role in reducing the risk of infection after surgery. Your skin needs to be as free of germs as possible. You can reduce the number of germs on your skin by washing with CHG (chlorhexidine  gluconate) soap before surgery. CHG is an antiseptic soap that kills germs and continues to kill germs even after washing.    DO NOT use if you have an allergy to chlorhexidine /CHG or antibacterial soaps. If your skin becomes reddened or irritated, stop using the CHG and notify one of our RNs at [phone]   Please shower  with the CHG soap starting 4 days before surgery using the following schedule: START SHOWERS ON   SUNDAY  May 20, 2024                                                                                                                                                                               Please keep in mind the  following:  DO NOT shave, including legs and underarms, starting the day of your first shower.   You may shave your face at any point before/day of surgery.    Place clean sheets on your bed the day you start using CHG soap. Use a clean washcloth (not used since being washed) for each shower. DO NOT sleep with pets once you start using the CHG.    CHG Shower Instructions:  If you choose to wash your hair and private area, wash first with your normal shampoo/soap.  After you use shampoo/soap, rinse your hair and body thoroughly to remove shampoo/soap residue.  Turn the water  OFF and apply about 3 tablespoons (45 ml) of CHG soap to a CLEAN washcloth.  Apply CHG soap ONLY FROM YOUR NECK DOWN TO YOUR TOES (washing for 3-5 minutes)  DO NOT use CHG soap on face, private areas, open wounds, or sores.  Pay special attention to the area where your surgery is being performed.  If you are having back surgery, having someone wash your back for you may be helpful.   Wait 2 minutes after CHG soap is applied, then you may rinse off the CHG soap.  Pat dry with a clean towel  Put on clean clothes/pajamas   If you choose to wear lotion, please use ONLY the CHG-compatible lotions on the back of this paper.     Additional instructions for the day of surgery: DO NOT APPLY any lotions, deodorants, cologne, or perfumes.   Put on clean/comfortable clothes.  Brush your teeth.  Ask your nurse before applying any prescription medications to the skin.          CHG Compatible Lotions    Aveeno Moisturizing lotion  Cetaphil Moisturizing Cream  Cetaphil Moisturizing Lotion  Clairol Herbal Essence Moisturizing Lotion, Dry Skin  Clairol Herbal Essence Moisturizing Lotion, Extra Dry Skin  Clairol Herbal Essence Moisturizing Lotion, Normal Skin  Curel Age Defying Therapeutic Moisturizing Lotion with Alpha Hydroxy  Curel Extreme Care Body Lotion  Curel Soothing Hands Moisturizing Hand Lotion  Curel  Therapeutic Moisturizing Cream, Fragrance-Free  Curel Therapeutic Moisturizing Lotion, Fragrance-Free  Curel Therapeutic Moisturizing Lotion, Original Formula  Eucerin Daily Replenishing Lotion  Eucerin Dry Skin Therapy Plus Alpha Hydroxy Crme  Eucerin Dry Skin Therapy Plus Alpha Hydroxy Lotion  Eucerin Original Crme  Eucerin Original Lotion  Eucerin Plus Crme Eucerin Plus Lotion  Eucerin TriLipid Replenishing Lotion  Keri Anti-Bacterial Hand Lotion  Keri Deep Conditioning Original Lotion Dry Skin Formula Softly Scented  Keri Deep Conditioning Original Lotion, Fragrance Free Sensitive Skin Formula  Keri Lotion Fast Absorbing Fragrance Free Sensitive Skin Formula  Keri Lotion Fast Absorbing Softly Scented Dry Skin Formula  Keri Original Lotion  Keri Skin Renewal Lotion Keri Silky Smooth Lotion  Keri Silky Smooth Sensitive Skin Lotion  Nivea Body Creamy Conditioning Oil  Nivea Body Extra Enriched Lotion  Nivea Body Original Lotion  Nivea Body Sheer Moisturizing Lotion Nivea Crme  Nivea Skin Firming Lotion  NutraDerm 30 Skin Lotion  NutraDerm Skin Lotion  NutraDerm Therapeutic Skin Cream  NutraDerm Therapeutic Skin Lotion  ProShield Protective Hand Cream  Provon moisturizing lotion     FAILURE TO FOLLOW THESE INSTRUCTIONS MAY RESULT IN THE CANCELLATION OF YOUR SURGERY   PATIENT SIGNATURE_________________________________   NURSE SIGNATURE__________________________________   ________________________________________________________________________            Marilyn Myers    An incentive spirometer is a tool that can help keep your lungs clear and active. This tool measures how well you are filling your lungs with each breath. Taking long deep breaths may help reverse or decrease the chance of developing breathing (pulmonary) problems (especially infection) following: A long period of time when you are unable to move or be active. BEFORE THE PROCEDURE  If the  spirometer includes an indicator to show your best effort, your nurse or respiratory therapist will set it to a desired goal. If possible, sit up straight or lean slightly forward. Try not to slouch. Hold the incentive spirometer in an upright position. INSTRUCTIONS FOR USE  Sit on the edge of your bed if possible, or sit up as far as you can in bed or on a chair. Hold the incentive spirometer in an upright position. Breathe out normally. Place the mouthpiece in your mouth and seal your lips tightly around it. Breathe in slowly and as deeply as possible, raising the piston or the ball toward the top of the column. Hold your breath for 3-5 seconds or for as long as possible. Allow the piston or ball to fall to the bottom of the column. Remove the mouthpiece from your mouth and breathe out normally. Rest for a few seconds and repeat Steps 1 through 7 at least 10 times every 1-2 hours when you are awake. Take your time and take a few normal breaths between deep breaths. The spirometer may include an indicator to show your best effort. Use the indicator as a goal to work toward during each repetition. After each set of 10 deep breaths, practice coughing to be sure your lungs are clear. If you have an incision (the cut made at the time of surgery), support your incision when coughing by placing a pillow or rolled up towels firmly against it. Once you are able to get out of bed, walk around indoors and cough well. You may stop using the incentive spirometer when instructed by your caregiver.  RISKS AND COMPLICATIONS Take your time so you do not get dizzy or light-headed. If you are in pain, you may need to take or ask for pain medication before doing incentive spirometry. It is harder to take a deep breath if you are having pain. AFTER USE Rest and breathe slowly and easily. It can be helpful to keep track of a log of your  progress. Your caregiver can provide you with a simple table to help with  this. If you are using the spirometer at home, follow these instructions: SEEK MEDICAL CARE IF:  You are having difficultly using the spirometer. You have trouble using the spirometer as often as instructed. Your pain medication is not giving enough relief while using the spirometer. You develop fever of 100.5 F (38.1 C) or higher.                                                                                                    SEEK IMMEDIATE MEDICAL CARE IF:  You cough up bloody sputum that had not been present before. You develop fever of 102 F (38.9 C) or greater. You develop worsening pain at or near the incision site. MAKE SURE YOU:  Understand these instructions. Will watch your condition. Will get help right away if you are not doing well or get worse. Document Released: 03/21/2007 Document Revised: 01/31/2012 Document Reviewed: 05/22/2007 [facility] Patient Information 2014 [place], [place].      If you would like to see a video about joint replacement:   [email or web address]

## 2024-05-17 ENCOUNTER — Other Ambulatory Visit: Payer: Self-pay

## 2024-05-17 ENCOUNTER — Encounter (HOSPITAL_COMMUNITY)
Admission: RE | Admit: 2024-05-17 | Discharge: 2024-05-17 | Disposition: A | Discharge status: Home / Self Care | Source: Ambulatory Visit | Attending: Orthopedic Surgery | Admitting: Orthopedic Surgery

## 2024-05-17 ENCOUNTER — Encounter (HOSPITAL_COMMUNITY): Payer: Self-pay

## 2024-05-17 VITALS — BP 125/78 | HR 92 | Temp 97.8°F | Resp 20 | Ht 66.0 in | Wt 200.0 lb

## 2024-05-17 DIAGNOSIS — Z01818 Encounter for other preprocedural examination: Secondary | ICD-10-CM | POA: Insufficient documentation

## 2024-05-17 DIAGNOSIS — I10 Essential (primary) hypertension: Secondary | ICD-10-CM | POA: Diagnosis not present

## 2024-05-17 DIAGNOSIS — R7989 Other specified abnormal findings of blood chemistry: Secondary | ICD-10-CM | POA: Diagnosis not present

## 2024-05-17 DIAGNOSIS — E119 Type 2 diabetes mellitus without complications: Secondary | ICD-10-CM | POA: Insufficient documentation

## 2024-05-17 HISTORY — DX: Anemia, unspecified: D64.9

## 2024-05-17 HISTORY — DX: Other specified abnormal findings of blood chemistry: R79.89

## 2024-05-17 LAB — TYPE AND SCREEN
ABO/RH(D): A POS
Antibody Screen: NEGATIVE

## 2024-05-17 LAB — COMPREHENSIVE METABOLIC PANEL WITH GFR
ALT: 12 U/L (ref 0–44)
AST: 15 U/L (ref 15–41)
Albumin: 3.5 g/dL (ref 3.5–5.0)
Alkaline Phosphatase: 95 U/L (ref 38–126)
Anion gap: 8 (ref 5–15)
BUN: 28 mg/dL — ABNORMAL HIGH (ref 8–23)
CO2: 29 mmol/L (ref 22–32)
Calcium: 9.2 mg/dL (ref 8.9–10.3)
Chloride: 101 mmol/L (ref 98–111)
Creatinine, Ser: 1.11 mg/dL — ABNORMAL HIGH (ref 0.44–1.00)
GFR, Estimated: 52 mL/min — ABNORMAL LOW (ref >60)
Glucose, Bld: 101 mg/dL — ABNORMAL HIGH (ref 70–99)
Potassium: 3.5 mmol/L (ref 3.5–5.1)
Sodium: 138 mmol/L (ref 135–145)
Total Bilirubin: 0.8 mg/dL (ref 0.0–1.2)
Total Protein: 7.4 g/dL (ref 6.5–8.1)

## 2024-05-17 LAB — CBC
HCT: 40.5 % (ref 36.0–46.0)
Hemoglobin: 12.5 g/dL (ref 12.0–15.0)
MCH: 30.3 pg (ref 26.0–34.0)
MCHC: 30.9 g/dL (ref 30.0–36.0)
MCV: 98.1 fL (ref 80.0–100.0)
Platelets: 265 10*3/uL (ref 150–400)
RBC: 4.13 MIL/uL (ref 3.87–5.11)
RDW: 14.2 % (ref 11.5–15.5)
WBC: 7.9 10*3/uL (ref 4.0–10.5)
nRBC: 0 % (ref 0.0–0.2)

## 2024-05-17 LAB — SURGICAL PCR SCREEN
MRSA, PCR: NEGATIVE
Staphylococcus aureus: NEGATIVE

## 2024-05-17 LAB — GLUCOSE, CAPILLARY: Glucose-Capillary: 93 mg/dL (ref 70–99)

## 2024-05-24 ENCOUNTER — Ambulatory Visit (HOSPITAL_COMMUNITY)
Admission: RE | Admit: 2024-05-24 | Discharge: 2024-05-25 | Disposition: A | Discharge status: Home / Self Care | Attending: Orthopedic Surgery | Admitting: Orthopedic Surgery

## 2024-05-24 ENCOUNTER — Other Ambulatory Visit: Payer: Self-pay

## 2024-05-24 ENCOUNTER — Ambulatory Visit (HOSPITAL_COMMUNITY): Admitting: Anesthesiology

## 2024-05-24 ENCOUNTER — Encounter (HOSPITAL_COMMUNITY): Payer: Self-pay | Admitting: Orthopedic Surgery

## 2024-05-24 ENCOUNTER — Ambulatory Visit (HOSPITAL_COMMUNITY)

## 2024-05-24 ENCOUNTER — Encounter (HOSPITAL_COMMUNITY)
Admission: RE | Disposition: A | Discharge status: Home / Self Care | Payer: Self-pay | Source: Home / Self Care | Attending: Orthopedic Surgery

## 2024-05-24 DIAGNOSIS — Z96642 Presence of left artificial hip joint: Secondary | ICD-10-CM | POA: Diagnosis present

## 2024-05-24 DIAGNOSIS — M25752 Osteophyte, left hip: Secondary | ICD-10-CM | POA: Insufficient documentation

## 2024-05-24 DIAGNOSIS — Z7984 Long term (current) use of oral hypoglycemic drugs: Secondary | ICD-10-CM | POA: Insufficient documentation

## 2024-05-24 DIAGNOSIS — Z87891 Personal history of nicotine dependence: Secondary | ICD-10-CM | POA: Diagnosis not present

## 2024-05-24 DIAGNOSIS — K219 Gastro-esophageal reflux disease without esophagitis: Secondary | ICD-10-CM | POA: Diagnosis not present

## 2024-05-24 DIAGNOSIS — F418 Other specified anxiety disorders: Secondary | ICD-10-CM | POA: Insufficient documentation

## 2024-05-24 DIAGNOSIS — I10 Essential (primary) hypertension: Secondary | ICD-10-CM | POA: Diagnosis not present

## 2024-05-24 DIAGNOSIS — E1142 Type 2 diabetes mellitus with diabetic polyneuropathy: Secondary | ICD-10-CM | POA: Insufficient documentation

## 2024-05-24 DIAGNOSIS — Z955 Presence of coronary angioplasty implant and graft: Secondary | ICD-10-CM | POA: Insufficient documentation

## 2024-05-24 DIAGNOSIS — E119 Type 2 diabetes mellitus without complications: Secondary | ICD-10-CM

## 2024-05-24 DIAGNOSIS — M1612 Unilateral primary osteoarthritis, left hip: Secondary | ICD-10-CM | POA: Diagnosis not present

## 2024-05-24 DIAGNOSIS — J449 Chronic obstructive pulmonary disease, unspecified: Secondary | ICD-10-CM | POA: Diagnosis not present

## 2024-05-24 DIAGNOSIS — D649 Anemia, unspecified: Secondary | ICD-10-CM | POA: Insufficient documentation

## 2024-05-24 DIAGNOSIS — G709 Myoneural disorder, unspecified: Secondary | ICD-10-CM | POA: Insufficient documentation

## 2024-05-24 DIAGNOSIS — Z96643 Presence of artificial hip joint, bilateral: Secondary | ICD-10-CM | POA: Diagnosis not present

## 2024-05-24 DIAGNOSIS — Z01818 Encounter for other preprocedural examination: Secondary | ICD-10-CM

## 2024-05-24 DIAGNOSIS — Z471 Aftercare following joint replacement surgery: Secondary | ICD-10-CM | POA: Diagnosis not present

## 2024-05-24 DIAGNOSIS — G4733 Obstructive sleep apnea (adult) (pediatric): Secondary | ICD-10-CM | POA: Insufficient documentation

## 2024-05-24 HISTORY — PX: TOTAL HIP ARTHROPLASTY: SHX124

## 2024-05-24 LAB — GLUCOSE, CAPILLARY
Glucose-Capillary: 99 mg/dL (ref 70–99)
Glucose-Capillary: 68 mg/dL — ABNORMAL LOW (ref 70–99)
Glucose-Capillary: 118 mg/dL — ABNORMAL HIGH (ref 70–99)
Glucose-Capillary: 111 mg/dL — ABNORMAL HIGH (ref 70–99)
Glucose-Capillary: 198 mg/dL — ABNORMAL HIGH (ref 70–99)

## 2024-05-24 SURGERY — ARTHROPLASTY, HIP, TOTAL, ANTERIOR APPROACH
Anesthesia: Monitor Anesthesia Care | Site: Hip | Laterality: Left

## 2024-05-24 MED ORDER — FENTANYL CITRATE PF 50 MCG/ML IJ SOSY
PREFILLED_SYRINGE | INTRAMUSCULAR | Status: AC
  Filled 2024-05-24: qty 1

## 2024-05-24 MED ORDER — FENTANYL CITRATE PF 50 MCG/ML IJ SOSY
PREFILLED_SYRINGE | INTRAMUSCULAR | Status: AC
Start: 2024-05-24 — End: 2024-05-24
  Filled 2024-05-24: qty 1

## 2024-05-24 MED ORDER — POTASSIUM CHLORIDE CRYS ER 10 MEQ PO TBCR
20.0000 meq | EXTENDED_RELEASE_TABLET | Freq: Every day | ORAL | Status: DC
  Administered 2024-05-24 – 2024-05-25 (×2): 20 meq via ORAL
  Filled 2024-05-24 (×2): qty 2

## 2024-05-24 MED ORDER — DULOXETINE HCL 60 MG PO CPEP
60.0000 mg | ORAL_CAPSULE | Freq: Every day | ORAL | Status: DC
  Administered 2024-05-25: 60 mg via ORAL
  Filled 2024-05-24: qty 1

## 2024-05-24 MED ORDER — SODIUM CHLORIDE 0.9 % IV SOLN: INTRAVENOUS | Status: DC

## 2024-05-24 MED ORDER — PHENOL 1.4 % MT LIQD: 1.0000 | OROMUCOSAL | Status: DC | PRN

## 2024-05-24 MED ORDER — KETOROLAC TROMETHAMINE 30 MG/ML IJ SOLN
INTRAMUSCULAR | Status: AC
  Filled 2024-05-24: qty 1

## 2024-05-24 MED ORDER — EPHEDRINE SULFATE-NACL 50-0.9 MG/10ML-% IV SOSY
PREFILLED_SYRINGE | INTRAVENOUS | Status: DC | PRN
  Administered 2024-05-24 (×2): 5 mg via INTRAVENOUS

## 2024-05-24 MED ORDER — ONDANSETRON HCL 4 MG/2ML IJ SOLN
INTRAMUSCULAR | Status: AC
Start: 2024-05-24 — End: 2024-05-24
  Filled 2024-05-24: qty 2

## 2024-05-24 MED ORDER — ISOPROPYL ALCOHOL 70 % SOLN
Status: DC | PRN
  Administered 2024-05-24: 1 via TOPICAL

## 2024-05-24 MED ORDER — FENTANYL CITRATE (PF) 100 MCG/2ML IJ SOLN
INTRAMUSCULAR | Status: DC | PRN
  Administered 2024-05-24: 50 ug via INTRAVENOUS

## 2024-05-24 MED ORDER — ONDANSETRON HCL 4 MG/2ML IJ SOLN
INTRAMUSCULAR | Status: DC | PRN
  Administered 2024-05-24: 4 mg via INTRAVENOUS

## 2024-05-24 MED ORDER — ONDANSETRON HCL 4 MG/2ML IJ SOLN: 4.0000 mg | Freq: Four times a day (QID) | INTRAMUSCULAR | Status: DC | PRN

## 2024-05-24 MED ORDER — BUPIVACAINE IN DEXTROSE 0.75-8.25 % IT SOLN
INTRATHECAL | Status: DC | PRN
  Administered 2024-05-24: 1.8 mL via INTRATHECAL

## 2024-05-24 MED ORDER — PROPOFOL 10 MG/ML IV BOLUS
INTRAVENOUS | Status: AC
Start: 2024-05-24 — End: 2024-05-24
  Filled 2024-05-24: qty 20

## 2024-05-24 MED ORDER — POVIDONE-IODINE 10 % EX SWAB: 2.0000 | Freq: Once | CUTANEOUS | Status: DC

## 2024-05-24 MED ORDER — OXYCODONE HCL 5 MG PO TABS
5.0000 mg | ORAL_TABLET | Freq: Once | ORAL | Status: AC | PRN
  Administered 2024-05-24: 5 mg via ORAL

## 2024-05-24 MED ORDER — GABAPENTIN 400 MG PO CAPS
1600.0000 mg | ORAL_CAPSULE | Freq: Three times a day (TID) | ORAL | Status: DC
  Administered 2024-05-24 – 2024-05-25 (×3): 1600 mg via ORAL
  Filled 2024-05-24 (×3): qty 4

## 2024-05-24 MED ORDER — ACETAMINOPHEN 10 MG/ML IV SOLN
1000.0000 mg | Freq: Once | INTRAVENOUS | Status: DC | PRN
Start: 2024-05-24 — End: 2024-05-24

## 2024-05-24 MED ORDER — PROPOFOL 500 MG/50ML IV EMUL
INTRAVENOUS | Status: DC | PRN
  Administered 2024-05-24: 50 ug/kg/min via INTRAVENOUS

## 2024-05-24 MED ORDER — SODIUM CHLORIDE 0.9 % IR SOLN
Status: DC | PRN
  Administered 2024-05-24: 1000 mL

## 2024-05-24 MED ORDER — DOCUSATE SODIUM 100 MG PO CAPS
100.0000 mg | ORAL_CAPSULE | Freq: Two times a day (BID) | ORAL | Status: DC
  Administered 2024-05-24 – 2024-05-25 (×2): 100 mg via ORAL
  Filled 2024-05-24 (×2): qty 1

## 2024-05-24 MED ORDER — CEFAZOLIN SODIUM-DEXTROSE 2-4 GM/100ML-% IV SOLN
2.0000 g | Freq: Four times a day (QID) | INTRAVENOUS | Status: AC
  Administered 2024-05-24 (×2): 2 g via INTRAVENOUS
  Filled 2024-05-24 (×2): qty 100

## 2024-05-24 MED ORDER — METHOCARBAMOL 1000 MG/10ML IJ SOLN: 500.0000 mg | Freq: Four times a day (QID) | INTRAMUSCULAR | Status: DC | PRN

## 2024-05-24 MED ORDER — ALUM & MAG HYDROXIDE-SIMETH 200-200-20 MG/5ML PO SUSP
30.0000 mL | ORAL | Status: DC | PRN
Start: 2024-05-24 — End: 2024-05-25

## 2024-05-24 MED ORDER — METHOCARBAMOL 500 MG PO TABS
500.0000 mg | ORAL_TABLET | Freq: Four times a day (QID) | ORAL | Status: DC | PRN
  Administered 2024-05-24 – 2024-05-25 (×2): 500 mg via ORAL
  Filled 2024-05-24: qty 1

## 2024-05-24 MED ORDER — DILTIAZEM HCL ER COATED BEADS 120 MG PO CP24
120.0000 mg | ORAL_CAPSULE | Freq: Every day | ORAL | Status: DC
  Filled 2024-05-24: qty 1

## 2024-05-24 MED ORDER — ONDANSETRON HCL 4 MG/2ML IJ SOLN
INTRAMUSCULAR | Status: AC
  Filled 2024-05-24: qty 2

## 2024-05-24 MED ORDER — METOCLOPRAMIDE HCL 5 MG PO TABS: 5.0000 mg | ORAL_TABLET | Freq: Three times a day (TID) | ORAL | Status: DC | PRN

## 2024-05-24 MED ORDER — HYDROCODONE-ACETAMINOPHEN 7.5-325 MG PO TABS: 1.0000 | ORAL_TABLET | ORAL | Status: DC | PRN

## 2024-05-24 MED ORDER — BUPIVACAINE-EPINEPHRINE (PF) 0.25% -1:200000 IJ SOLN
INTRAMUSCULAR | Status: AC
  Filled 2024-05-24: qty 30

## 2024-05-24 MED ORDER — PROPOFOL 10 MG/ML IV BOLUS
INTRAVENOUS | Status: DC | PRN
  Administered 2024-05-24 (×2): 20 mg via INTRAVENOUS
  Administered 2024-05-24: 30 mg via INTRAVENOUS

## 2024-05-24 MED ORDER — EPHEDRINE 5 MG/ML INJ
INTRAVENOUS | Status: AC
  Filled 2024-05-24: qty 5

## 2024-05-24 MED ORDER — PHENYLEPHRINE HCL-NACL 20-0.9 MG/250ML-% IV SOLN
INTRAVENOUS | Status: DC | PRN
  Administered 2024-05-24: 30 ug/min via INTRAVENOUS

## 2024-05-24 MED ORDER — LACTATED RINGERS IV SOLN: INTRAVENOUS | Status: DC

## 2024-05-24 MED ORDER — FENTANYL CITRATE PF 50 MCG/ML IJ SOSY
25.0000 ug | PREFILLED_SYRINGE | INTRAMUSCULAR | Status: DC | PRN
  Administered 2024-05-24 (×3): 50 ug via INTRAVENOUS

## 2024-05-24 MED ORDER — MENTHOL 3 MG MT LOZG: 1.0000 | LOZENGE | OROMUCOSAL | Status: DC | PRN

## 2024-05-24 MED ORDER — INSULIN ASPART 100 UNIT/ML IJ SOLN: 0.0000 [IU] | Freq: Every day | INTRAMUSCULAR | Status: DC

## 2024-05-24 MED ORDER — ONDANSETRON HCL 4 MG/2ML IJ SOLN: 4.0000 mg | Freq: Once | INTRAMUSCULAR | Status: DC | PRN

## 2024-05-24 MED ORDER — SODIUM CHLORIDE (PF) 0.9 % IJ SOLN
INTRAMUSCULAR | Status: AC
  Filled 2024-05-24: qty 30

## 2024-05-24 MED ORDER — ORAL CARE MOUTH RINSE: 15.0000 mL | Freq: Once | OROMUCOSAL | Status: AC

## 2024-05-24 MED ORDER — SODIUM CHLORIDE (PF) 0.9 % IJ SOLN
INTRAMUSCULAR | Status: DC | PRN
  Administered 2024-05-24: 61 mL

## 2024-05-24 MED ORDER — FENTANYL CITRATE (PF) 100 MCG/2ML IJ SOLN
INTRAMUSCULAR | Status: AC
  Filled 2024-05-24: qty 2

## 2024-05-24 MED ORDER — ONDANSETRON HCL 4 MG PO TABS: 4.0000 mg | ORAL_TABLET | Freq: Four times a day (QID) | ORAL | Status: DC | PRN

## 2024-05-24 MED ORDER — PROPOFOL 500 MG/50ML IV EMUL
INTRAVENOUS | Status: AC
  Filled 2024-05-24: qty 50

## 2024-05-24 MED ORDER — METHOCARBAMOL 500 MG PO TABS
ORAL_TABLET | ORAL | Status: AC
  Filled 2024-05-24: qty 1

## 2024-05-24 MED ORDER — PANTOPRAZOLE SODIUM 40 MG PO TBEC
40.0000 mg | DELAYED_RELEASE_TABLET | Freq: Every day | ORAL | Status: DC
  Administered 2024-05-24 – 2024-05-25 (×2): 40 mg via ORAL
  Filled 2024-05-24 (×2): qty 1

## 2024-05-24 MED ORDER — MORPHINE SULFATE (PF) 2 MG/ML IV SOLN: 0.5000 mg | INTRAVENOUS | Status: DC | PRN

## 2024-05-24 MED ORDER — METOCLOPRAMIDE HCL 5 MG/ML IJ SOLN: 5.0000 mg | Freq: Three times a day (TID) | INTRAMUSCULAR | Status: DC | PRN

## 2024-05-24 MED ORDER — DOXEPIN HCL 25 MG PO CAPS: 25.0000 mg | ORAL_CAPSULE | Freq: Every evening | ORAL | Status: DC | PRN

## 2024-05-24 MED ORDER — OXYCODONE HCL 5 MG/5ML PO SOLN: 5.0000 mg | Freq: Once | ORAL | Status: AC | PRN

## 2024-05-24 MED ORDER — PHENYLEPHRINE HCL-NACL 20-0.9 MG/250ML-% IV SOLN
INTRAVENOUS | Status: AC
  Filled 2024-05-24: qty 250

## 2024-05-24 MED ORDER — MAGNESIUM OXIDE -MG SUPPLEMENT 400 (240 MG) MG PO TABS
400.0000 mg | ORAL_TABLET | Freq: Every day | ORAL | Status: DC
  Administered 2024-05-25: 400 mg via ORAL
  Filled 2024-05-24: qty 1

## 2024-05-24 MED ORDER — EZETIMIBE 10 MG PO TABS
10.0000 mg | ORAL_TABLET | Freq: Every evening | ORAL | Status: DC
  Filled 2024-05-24: qty 1

## 2024-05-24 MED ORDER — ACETAMINOPHEN 500 MG PO TABS
1000.0000 mg | ORAL_TABLET | Freq: Once | ORAL | Status: AC
  Administered 2024-05-24: 1000 mg via ORAL
  Filled 2024-05-24: qty 2

## 2024-05-24 MED ORDER — CEFAZOLIN SODIUM-DEXTROSE 2-4 GM/100ML-% IV SOLN
2.0000 g | INTRAVENOUS | Status: AC
  Administered 2024-05-24: 2 g via INTRAVENOUS
  Filled 2024-05-24: qty 100

## 2024-05-24 MED ORDER — NYSTATIN 100000 UNIT/GM EX POWD: 1.0000 | Freq: Every day | CUTANEOUS | Status: DC | PRN

## 2024-05-24 MED ORDER — ASPIRIN 81 MG PO CHEW
81.0000 mg | CHEWABLE_TABLET | Freq: Two times a day (BID) | ORAL | Status: DC
  Administered 2024-05-24 – 2024-05-25 (×2): 81 mg via ORAL
  Filled 2024-05-24 (×2): qty 1

## 2024-05-24 MED ORDER — SENNA 8.6 MG PO TABS
1.0000 | ORAL_TABLET | Freq: Two times a day (BID) | ORAL | Status: DC
  Administered 2024-05-24 – 2024-05-25 (×2): 8.6 mg via ORAL
  Filled 2024-05-24 (×2): qty 1

## 2024-05-24 MED ORDER — OXYCODONE HCL 5 MG PO TABS
ORAL_TABLET | ORAL | Status: AC
  Filled 2024-05-24: qty 1

## 2024-05-24 MED ORDER — ACETAMINOPHEN 325 MG PO TABS: 325.0000 mg | ORAL_TABLET | Freq: Four times a day (QID) | ORAL | Status: DC | PRN

## 2024-05-24 MED ORDER — CHLORHEXIDINE GLUCONATE 0.12 % MT SOLN
15.0000 mL | Freq: Once | OROMUCOSAL | Status: AC
  Administered 2024-05-24: 15 mL via OROMUCOSAL

## 2024-05-24 MED ORDER — WATER FOR IRRIGATION, STERILE IR SOLN
Status: DC | PRN
  Administered 2024-05-24: 2000 mL

## 2024-05-24 MED ORDER — TRANEXAMIC ACID-NACL 1000-0.7 MG/100ML-% IV SOLN
1000.0000 mg | INTRAVENOUS | Status: AC
  Administered 2024-05-24: 1000 mg via INTRAVENOUS
  Filled 2024-05-24: qty 100

## 2024-05-24 MED ORDER — HYDROCODONE-ACETAMINOPHEN 5-325 MG PO TABS
1.0000 | ORAL_TABLET | ORAL | Status: DC | PRN
  Administered 2024-05-24 – 2024-05-25 (×2): 2 via ORAL
  Filled 2024-05-24 (×2): qty 2

## 2024-05-24 MED ORDER — INSULIN ASPART 100 UNIT/ML IJ SOLN: 0.0000 [IU] | INTRAMUSCULAR | Status: DC | PRN

## 2024-05-24 MED ORDER — BISACODYL 10 MG RE SUPP: 10.0000 mg | Freq: Every day | RECTAL | Status: DC | PRN

## 2024-05-24 MED ORDER — DIPHENHYDRAMINE HCL 12.5 MG/5ML PO ELIX: 12.5000 mg | ORAL_SOLUTION | ORAL | Status: DC | PRN

## 2024-05-24 MED ORDER — POLYETHYLENE GLYCOL 3350 17 G PO PACK: 17.0000 g | PACK | Freq: Every day | ORAL | Status: DC | PRN

## 2024-05-24 MED ORDER — INSULIN ASPART 100 UNIT/ML IJ SOLN
0.0000 [IU] | Freq: Three times a day (TID) | INTRAMUSCULAR | Status: DC
  Administered 2024-05-25: 2 [IU] via SUBCUTANEOUS

## 2024-05-24 SURGICAL SUPPLY — 48 items
BAG COUNTER SPONGE SURGICOUNT (BAG) IMPLANT
BAG ZIPLOCK 12X15 (MISCELLANEOUS) IMPLANT
BLADE SAW RECIPROCATING 77.5 (BLADE) ×1 IMPLANT
CHLORAPREP W/TINT 26 (MISCELLANEOUS) ×1 IMPLANT
COVER PERINEAL POST (MISCELLANEOUS) ×1 IMPLANT
COVER SURGICAL LIGHT HANDLE (MISCELLANEOUS) ×1 IMPLANT
DERMABOND ADVANCED .7 DNX12 (GAUZE/BANDAGES/DRESSINGS) ×2 IMPLANT
DRAPE IMP U-DRAPE 54X76 (DRAPES) ×1 IMPLANT
DRAPE SHEET LG 3/4 BI-LAMINATE (DRAPES) ×3 IMPLANT
DRAPE STERI IOBAN 125X83 (DRAPES) ×1 IMPLANT
DRAPE U-SHAPE 47X51 STRL (DRAPES) ×2 IMPLANT
DRESSING AQUACEL AG SP 3.5X10 (GAUZE/BANDAGES/DRESSINGS) IMPLANT
DRSG AQUACEL AG ADV 3.5X10 (GAUZE/BANDAGES/DRESSINGS) ×1 IMPLANT
ELECT REM PT RETURN 15FT ADLT (MISCELLANEOUS) ×1 IMPLANT
GAUZE SPONGE 4X4 12PLY STRL (GAUZE/BANDAGES/DRESSINGS) ×1 IMPLANT
GLOVE BIO SURGEON STRL SZ7 (GLOVE) ×1 IMPLANT
GLOVE BIO SURGEON STRL SZ8.5 (GLOVE) ×2 IMPLANT
GLOVE BIOGEL PI IND STRL 7.5 (GLOVE) ×1 IMPLANT
GLOVE BIOGEL PI IND STRL 8.5 (GLOVE) ×1 IMPLANT
GOWN SPEC L3 XXLG W/TWL (GOWN DISPOSABLE) ×1 IMPLANT
GOWN STRL REUS W/ TWL XL LVL3 (GOWN DISPOSABLE) ×1 IMPLANT
HEAD CERAMIC BIOLOX 36 (Head) IMPLANT
HOLDER FOLEY CATH W/STRAP (MISCELLANEOUS) ×1 IMPLANT
HOOD PEEL AWAY T7 (MISCELLANEOUS) ×3 IMPLANT
KIT TURNOVER KIT A (KITS) ×1 IMPLANT
LINER ACETAB NN G7 F 36 (Liner) IMPLANT
MANIFOLD NEPTUNE II (INSTRUMENTS) ×1 IMPLANT
MARKER SKIN DUAL TIP RULER LAB (MISCELLANEOUS) ×1 IMPLANT
NDL SAFETY ECLIPSE 18X1.5 (NEEDLE) ×1 IMPLANT
NDL SPNL 18GX3.5 QUINCKE PK (NEEDLE) ×1 IMPLANT
NEEDLE SPNL 18GX3.5 QUINCKE PK (NEEDLE) ×1 IMPLANT
PACK ANTERIOR HIP CUSTOM (KITS) ×1 IMPLANT
PENCIL SMOKE EVACUATOR (MISCELLANEOUS) ×1 IMPLANT
SEALER BIPOLAR AQUA 6.0 (INSTRUMENTS) ×1 IMPLANT
SET HNDPC FAN SPRY TIP SCT (DISPOSABLE) ×1 IMPLANT
SHELL ACETAB 54 4H HIP (Shell) IMPLANT
SOLUTION PRONTOSAN WOUND 350ML (IRRIGATION / IRRIGATOR) ×1 IMPLANT
SPIKE FLUID TRANSFER (MISCELLANEOUS) ×1 IMPLANT
STEM FEM CMTLS 15X115 133D (Stem) IMPLANT
SUT MNCRL AB 3-0 PS2 18 (SUTURE) ×1 IMPLANT
SUT MON AB 2-0 CT1 36 (SUTURE) ×1 IMPLANT
SUT STRATAFIX 14 PDO 48 VLT (SUTURE) ×1 IMPLANT
SUT VIC AB 2-0 CT1 TAPERPNT 27 (SUTURE) IMPLANT
SYR 3ML LL SCALE MARK (SYRINGE) ×1 IMPLANT
TOWEL GREEN STERILE FF (TOWEL DISPOSABLE) ×1 IMPLANT
TRAY FOLEY MTR SLVR 16FR STAT (SET/KITS/TRAYS/PACK) IMPLANT
TUBE SUCTION HIGH CAP CLEAR NV (SUCTIONS) ×1 IMPLANT
WATER STERILE IRR 1000ML POUR (IV SOLUTION) ×1 IMPLANT

## 2024-05-24 NOTE — Anesthesia Procedure Notes (Signed)
 Spinal  Patient location during procedure: OR Start time: 05/24/2024 10:09 AM End time: 05/24/2024 10:13 AM Reason for block: surgical anesthesia Staffing Performed: resident/CRNA  Resident/CRNA: Dasie Nena PARAS, CRNA Performed by: Dasie Nena PARAS, CRNA Authorized by: Keneth Lynwood POUR, MD   Preanesthetic Checklist Completed: patient identified, IV checked, site marked, risks and benefits discussed, surgical consent, monitors and equipment checked, pre-op evaluation and timeout performed Spinal Block Patient position: sitting Prep: DuraPrep and site prepped and draped Patient monitoring: heart rate, continuous pulse ox, blood pressure and cardiac monitor Approach: midline Location: L3-4 Injection technique: single-shot Needle Needle type: Pencan  Needle gauge: 24 G Needle length: 10 cm Assessment Events: CSF return Additional Notes Expiration date on kit noted and within range.  Lot # [id number].  Sterile prep and drape.    Local with Lidocaine  1%.  Good CSF flow noted with no heme or c/o paresthesia.   Patient tolerated well.

## 2024-05-24 NOTE — Transfer of Care (Signed)
 Immediate Anesthesia Transfer of Care Note  Patient: Marilyn Myers  Procedure(s) Performed: ARTHROPLASTY, HIP, TOTAL, ANTERIOR APPROACH (Left: Hip)  Patient Location: PACU  Anesthesia Type:Spinal  Level of Consciousness: awake, alert , and patient cooperative  Airway & Oxygen Therapy: Patient Spontanous Breathing and Patient connected to face mask oxygen  Post-op Assessment: Report given to RN and Post -op Vital signs reviewed and stable  Post vital signs: Reviewed and stable  Last Vitals:  Vitals Value Taken Time  BP 109/63 05/24/24 12:32  Temp    Pulse 79 05/24/24 12:35  Resp 16 05/24/24 12:35  SpO2 95 % 05/24/24 12:35  Vitals shown include unfiled device data.  Last Pain:  Vitals:   05/24/24 0854  TempSrc:   PainSc: 0-No pain      Patients Stated Pain Goal: 4 (05/24/24 0854)  Complications: No notable events documented.

## 2024-05-24 NOTE — Progress Notes (Signed)
 Arrived from PACU post-operatively.  Handoff received from PACU RN Selinda.     05/24/24 1510  Vitals  Temp 97.6 F (36.4 C)  Temp Source Oral  BP 105/62  MAP (mmHg) 71  BP Location Left Arm  BP Method Automatic  Patient Position (if appropriate) Lying  Pulse Rate 65  Pulse Rate Source Dinamap  Resp 14  Level of Consciousness  Level of Consciousness Alert  MEWS COLOR  MEWS Score Color Green  Oxygen Therapy  SpO2 98 %  O2 Device Room Air  Pain Assessment  Pain Scale 0-10  Pain Score 0  MEWS Score  MEWS Temp 0  MEWS Systolic 0  MEWS Pulse 0  MEWS RR 0  MEWS LOC 0  MEWS Score 0

## 2024-05-24 NOTE — Anesthesia Procedure Notes (Signed)
 Procedure Name: MAC Date/Time: 05/24/2024 10:09 AM  Performed by: Dasie Nena PARAS, CRNAPre-anesthesia Checklist: Patient identified, Emergency Drugs available, Suction available, Patient being monitored and Timeout performed Oxygen Delivery Method: Simple face mask Placement Confirmation: positive ETCO2

## 2024-05-24 NOTE — Op Note (Signed)
 OPERATIVE REPORT  SURGEON: Redell Shoals, MD   ASSISTANT: Valery Potters, PA-C.  PREOPERATIVE DIAGNOSIS: Left hip arthritis.   POSTOPERATIVE DIAGNOSIS: Left hip arthritis.   PROCEDURE: Left total hip arthroplasty, anterior approach.   IMPLANTS: Biomet Taperloc Complete Microplasty stem, size 15 x 115 mm, high offset. Biomet G7 OsseoTi Cup, size 54 mm. Biomet Vivacit-E liner, size 36 mm, F,  neutral. Biomet Biolox ceramic head ball, size 36 - 3 mm.  ANESTHESIA:  MAC and Spinal  ESTIMATED BLOOD LOSS:-200 mL    ANTIBIOTICS: 2g Ancef .  DRAINS: None.  COMPLICATIONS: None.   CONDITION: PACU - hemodynamically stable.   BRIEF CLINICAL NOTE: Marilyn Myers is a 73 y.o. female with a long-standing history of Left hip arthritis. After failing conservative management, the patient was indicated for total hip arthroplasty. The risks, benefits, and alternatives to the procedure were explained, and the patient elected to proceed.  PROCEDURE IN DETAIL: Surgical site was marked by myself in the pre-op holding area. Once inside the operating room, spinal anesthesia was obtained, and a foley catheter was inserted. The patient was then positioned on the Hana table.  All bony prominences were well padded.  The hip was prepped and draped in the normal sterile surgical fashion.  A time-out was called verifying side and site of surgery. The patient received IV antibiotics within 60 minutes of beginning the procedure.   Bikini incision was made, and superficial dissection was performed lateral to the ASIS. The direct anterior approach to the hip was performed through the Hueter interval.  Lateral femoral circumflex vessels were treated with the Auqumantys. The anterior capsule was exposed and an inverted T capsulotomy was made. The femoral neck cut was made to the level of the templated cut.  A corkscrew was placed into the head and the head was removed.  The femoral head was found to have eburnated  bone. The head was passed to the back table and was measured. Pubofemoral ligament was released off of the calcar, taking care to stay on bone. Superior capsule was released from the greater trochanter, taking care to stay lateral to the posterior border of the femoral neck in order to preserve the short external rotators.   Acetabular exposure was achieved, and the pulvinar and labrum were excised. Sequential reaming of the acetabulum was then performed up to a size 53 mm reamer. A 54 mm cup was then opened and impacted into place at approximately 40 degrees of abduction and 20 degrees of anteversion. The final polyethylene liner was impacted into place and acetabular osteophytes were removed.    I then gained femoral exposure taking care to protect the abductors and greater trochanter.  This was performed using standard external rotation, extension, and adduction.  A cookie cutter was used to enter the femoral canal, and then the femoral canal finder was placed.  Sequential broaching was performed up to a size 15.  Calcar planer was used on the femoral neck remnant.  I placed a high offset neck and a trial head ball.  The hip was reduced.  Leg lengths and offset were checked fluoroscopically.  The hip was dislocated and trial components were removed.  The final implants were placed, and the hip was reduced.  Fluoroscopy was used to confirm component position and leg lengths.  At 90 degrees of external rotation and full extension, the hip was stable to an anterior directed force.   The wound was copiously irrigated with Prontosan solution and normal saline using pulse lavage.  Marcaine  solution was injected into the periarticular soft tissue.  The wound was closed in layers using #1 Vicryl and V-Loc for the fascia, 2-0 Vicryl for the subcutaneous fat, 2-0 Monocryl for the deep dermal layer, 3-0 running Monocryl subcuticular stitch, and Dermabond for the skin.  Once the glue was fully dried, an Aquacell Ag  dressing was applied.  The patient was transported to the recovery room in stable condition.  Sponge, needle, and instrument counts were correct at the end of the case x2.  The patient tolerated the procedure well and there were no known complications.  Please note that a surgical assistant was a medical necessity for this procedure to perform it in a safe and expeditious manner. Assistant was necessary to provide appropriate retraction of vital neurovascular structures, to prevent femoral fracture, and to allow for anatomic placement of the prosthesis.

## 2024-05-24 NOTE — Plan of Care (Signed)
 Alert and oriented. OOB to chair with PT assistance.  No pain noted on assessment.  Left lower extremity neurovascular assessment completed. Tolerating oral intake.  Foley remains.  Family, son/ Prentice updated by patient.   Problem: Education: Goal: Knowledge of General Education information will improve Description: Including pain rating scale, medication(s)/side effects and non-pharmacologic comfort measures Outcome: Progressing   Problem: Health Behavior/Discharge Planning: Goal: Ability to manage health-related needs will improve Outcome: Progressing   Problem: Clinical Measurements: Goal: Ability to maintain clinical measurements within normal limits will improve Outcome: Progressing Goal: Will remain free from infection Outcome: Progressing Goal: Diagnostic test results will improve Outcome: Progressing Goal: Respiratory complications will improve Outcome: Progressing

## 2024-05-24 NOTE — Progress Notes (Signed)
 Physical Therapy Treatment Patient Details Name: Marilyn Myers MRN: [id number] DOB: Mar 25, 1951 Today's Date: 05/24/2024   History of Present Illness Pt s/p L THR with hx of DM and R THR (1/25)    PT Comments  Pt s/p L THR and presents with decreased L LE strength/ROM and post op pain limiting functional mobility.  Pt should progress well to dc home with family assist.    If plan is discharge home, recommend the following: A little help with walking and/or transfers;A little help with bathing/dressing/bathroom;Assistance with cooking/housework;Assist for transportation;Help with stairs or ramp for entrance   Can travel by private vehicle        Equipment Recommendations  None recommended by PT    Recommendations for Other Services       Precautions / Restrictions Precautions Precautions: Fall Restrictions Weight Bearing Restrictions Per Provider Order: No Other Position/Activity Restrictions: WBAT     Mobility  Bed Mobility Overal bed mobility: Needs Assistance Bed Mobility: Supine to Sit     Supine to sit: Contact guard     General bed mobility comments: for safety only    Transfers Overall transfer level: Needs assistance Equipment used: Rolling walker (2 wheels) Transfers: Sit to/from Stand Sit to Stand: Min assist           General transfer comment: Steady assist with cues for LE management and use of UEs to self assist    Ambulation/Gait Ambulation/Gait assistance: Min assist, Contact guard assist Gait Distance (Feet): 35 Feet Assistive device: Rolling walker (2 wheels) Gait Pattern/deviations: Step-to pattern, Decreased step length - right, Decreased step length - left, Shuffle, Trunk flexed Gait velocity: decr     General Gait Details: cues for posture, position from RW and sequence   Stairs             Wheelchair Mobility     Tilt Bed    Modified Rankin (Stroke Patients Only)       Balance Overall balance assessment: Needs  assistance Sitting-balance support: Single extremity supported Sitting balance-Leahy Scale: Good     Standing balance support: Bilateral upper extremity supported Standing balance-Leahy Scale: Poor                              Communication Communication Communication: No apparent difficulties  Cognition Arousal: Alert Behavior During Therapy: WFL for tasks assessed/performed   PT - Cognitive impairments: No apparent impairments                         Following commands: Intact      Cueing Cueing Techniques: Verbal cues  Exercises Total Joint Exercises Ankle Circles/Pumps: AROM, Both, 15 reps, Supine    General Comments        Pertinent Vitals/Pain Pain Assessment Pain Assessment: 0-10 Pain Score: 5  Pain Location: L hip with activity Pain Descriptors / Indicators: Aching, Sore Pain Intervention(s): Premedicated before session, Monitored during session, Limited activity within patient's tolerance    Home Living Family/patient expects to be discharged to:: Private residence Living Arrangements: Alone Available Help at Discharge: Available 24 hours/day;Family Type of Home: House Home Access: Stairs to enter   [employer] of Steps: 1+1+1   Home Layout: One level Home Equipment: Cane - single point;Toilet riser;Rolling Walker (2 wheels)      Prior Function            PT Goals (current goals can now be found  in the care plan section) Acute Rehab PT Goals Patient Stated Goal: Regain IND PT Goal Formulation: With patient Time For Goal Achievement: 05/31/24 Potential to Achieve Goals: Good    Frequency    Min 1X/week      PT Plan      Co-evaluation              AM-PAC PT 6 Clicks Mobility   Outcome Measure  Help needed turning from your back to your side while in a flat bed without using bedrails?: A Little Help needed moving from lying on your back to sitting on the side of a flat bed without using  bedrails?: A Little Help needed moving to and from a bed to a chair (including a wheelchair)?: A Little Help needed standing up from a chair using your arms (e.g., wheelchair or bedside chair)?: A Little Help needed to walk in hospital room?: A Little Help needed climbing 3-5 steps with a railing? : A Lot 6 Click Score: 17    End of Session Equipment Utilized During Treatment: Gait belt Activity Tolerance: Patient tolerated treatment well Patient left: in chair;with call bell/phone within reach;with chair alarm set Nurse Communication: Mobility status PT Visit Diagnosis: Unsteadiness on feet (R26.81);Difficulty in walking, not elsewhere classified (R26.2)     Time: 8398-8372 PT Time Calculation (min) (ACUTE ONLY): 26 min  Charges:    $Gait Training: 8-22 mins PT General Charges $$ ACUTE PT VISIT: 1 Visit                     [facility] PT Acute Rehabilitation Services Office [phone]    Kadeidra Coryell 05/24/2024, 4:45 PM

## 2024-05-24 NOTE — Interval H&P Note (Signed)
 History and Physical Interval Note:  05/24/2024 10:03 AM  Marilyn Myers  has presented today for surgery, with the diagnosis of Left hip osteoarthritis.  The various methods of treatment have been discussed with the patient and family. After consideration of risks, benefits and other options for treatment, the patient has consented to  Procedure(s): ARTHROPLASTY, HIP, TOTAL, ANTERIOR APPROACH (Left) as a surgical intervention.  The patient's history has been reviewed, patient examined, no change in status, stable for surgery.  I have reviewed the patient's chart and labs.  Questions were answered to the patient's satisfaction.     Redell PARAS Chanta Bauers

## 2024-05-24 NOTE — Discharge Instructions (Signed)

## 2024-05-24 NOTE — Anesthesia Postprocedure Evaluation (Signed)
 Anesthesia Post Note  Patient: Marilyn Myers  Procedure(s) Performed: ARTHROPLASTY, HIP, TOTAL, ANTERIOR APPROACH (Left: Hip)     Patient location during evaluation: PACU Anesthesia Type: Spinal and MAC Level of consciousness: awake and alert Pain management: pain level controlled Vital Signs Assessment: post-procedure vital signs reviewed and stable Respiratory status: spontaneous breathing, nonlabored ventilation, respiratory function stable and patient connected to nasal cannula oxygen Cardiovascular status: blood pressure returned to baseline and stable Postop Assessment: no apparent nausea or vomiting Anesthetic complications: no   No notable events documented.  Last Vitals:  Vitals:   05/24/24 0812 05/24/24 1232  BP: (!) 129/90   Pulse: 82 81  Resp: 20 13  Temp: 36.4 C (!) 36 C  SpO2: 99% 99%    Last Pain:  Vitals:   05/24/24 0854  TempSrc:   PainSc: 0-No pain                 Lynwood MARLA Cornea

## 2024-05-24 NOTE — Anesthesia Preprocedure Evaluation (Signed)
 Anesthesia Evaluation  Patient identified by MRN, date of birth, ID band Patient awake    Reviewed: Allergy & Precautions, NPO status , Patient's Chart, lab work & pertinent test results, reviewed documented beta blocker date and time   History of Anesthesia Complications Negative for: history of anesthetic complications  Airway Mallampati: II  TM Distance: >3 FB     Dental no notable dental hx.    Pulmonary sleep apnea , neg COPD, former smoker   breath sounds clear to auscultation       Cardiovascular hypertension, (-) CAD, (-) Past MI and (-) Cardiac Stents + dysrhythmias  Rhythm:Regular Rate:Normal     Neuro/Psych neg Seizures PSYCHIATRIC DISORDERS Anxiety Depression     Neuromuscular disease    GI/Hepatic ,GERD  ,,(+) neg Cirrhosis        Endo/Other  diabetes, Type 2    Renal/GU Renal disease     Musculoskeletal  (+) Arthritis , Osteoarthritis,    Abdominal   Peds  Hematology  (+) Blood dyscrasia, anemia   Anesthesia Other Findings   Reproductive/Obstetrics                              Anesthesia Physical Anesthesia Plan  ASA: 2  Anesthesia Plan: Spinal and MAC   Post-op Pain Management:    Induction: Intravenous  PONV Risk Score and Plan: 2 and Ondansetron   Airway Management Planned: Natural Airway and Simple Face Mask  Additional Equipment:   Intra-op Plan:   Post-operative Plan:   Informed Consent: I have reviewed the patients History and Physical, chart, labs and discussed the procedure including the risks, benefits and alternatives for the proposed anesthesia with the patient or authorized representative who has indicated his/her understanding and acceptance.     Dental advisory given  Plan Discussed with: CRNA  Anesthesia Plan Comments:          Anesthesia Quick Evaluation

## 2024-05-24 NOTE — Care Plan (Signed)
 Ortho Bundle Case Management Note  Patient Details  Name: Marilyn Myers MRN: [id number] Date of Birth: 02-Feb-1951  LT THA on 05/24/24  DCP: Home with son Emanuel Campos) DME: No needs, has RW PT: HEP                   DME Arranged:  N/A DME Agency:  NA  HH Arranged:    HH Agency:     Additional Comments: Please contact me with any questions of if this plan should need to change.  Burnard Dross, Case Manager [facility] [phone]   Ext. [phone]   05/24/2024, 8:07 AM

## 2024-05-25 DIAGNOSIS — M25752 Osteophyte, left hip: Secondary | ICD-10-CM | POA: Diagnosis not present

## 2024-05-25 DIAGNOSIS — Z87891 Personal history of nicotine dependence: Secondary | ICD-10-CM | POA: Diagnosis not present

## 2024-05-25 DIAGNOSIS — G4733 Obstructive sleep apnea (adult) (pediatric): Secondary | ICD-10-CM | POA: Diagnosis not present

## 2024-05-25 DIAGNOSIS — I10 Essential (primary) hypertension: Secondary | ICD-10-CM | POA: Diagnosis not present

## 2024-05-25 DIAGNOSIS — J449 Chronic obstructive pulmonary disease, unspecified: Secondary | ICD-10-CM | POA: Diagnosis not present

## 2024-05-25 DIAGNOSIS — M1612 Unilateral primary osteoarthritis, left hip: Secondary | ICD-10-CM | POA: Diagnosis not present

## 2024-05-25 LAB — CBC
HCT: 32.6 % — ABNORMAL LOW (ref 36.0–46.0)
Hemoglobin: 10 g/dL — ABNORMAL LOW (ref 12.0–15.0)
MCH: 29.5 pg (ref 26.0–34.0)
MCHC: 30.7 g/dL (ref 30.0–36.0)
MCV: 96.2 fL (ref 80.0–100.0)
Platelets: 199 K/uL (ref 150–400)
RBC: 3.39 MIL/uL — ABNORMAL LOW (ref 3.87–5.11)
RDW: 14.2 % (ref 11.5–15.5)
WBC: 8.7 K/uL (ref 4.0–10.5)
nRBC: 0 % (ref 0.0–0.2)

## 2024-05-25 LAB — BASIC METABOLIC PANEL WITH GFR
Anion gap: 11 (ref 5–15)
BUN: 28 mg/dL — ABNORMAL HIGH (ref 8–23)
CO2: 25 mmol/L (ref 22–32)
Calcium: 8.2 mg/dL — ABNORMAL LOW (ref 8.9–10.3)
Chloride: 103 mmol/L (ref 98–111)
Creatinine, Ser: 1.67 mg/dL — ABNORMAL HIGH (ref 0.44–1.00)
GFR, Estimated: 32 mL/min — ABNORMAL LOW (ref >60)
Glucose, Bld: 214 mg/dL — ABNORMAL HIGH (ref 70–99)
Potassium: 3.6 mmol/L (ref 3.5–5.1)
Sodium: 139 mmol/L (ref 135–145)

## 2024-05-25 LAB — GLUCOSE, CAPILLARY: Glucose-Capillary: 178 mg/dL — ABNORMAL HIGH (ref 70–99)

## 2024-05-25 MED ORDER — ONDANSETRON HCL 4 MG PO TABS: 4.0000 mg | ORAL_TABLET | Freq: Three times a day (TID) | ORAL | 0 refills | Status: AC | PRN

## 2024-05-25 MED ORDER — HYDROCODONE-ACETAMINOPHEN 5-325 MG PO TABS: 1.0000 | ORAL_TABLET | ORAL | 0 refills | Status: AC | PRN

## 2024-05-25 MED ORDER — ASPIRIN 81 MG PO CHEW: 81.0000 mg | CHEWABLE_TABLET | Freq: Two times a day (BID) | ORAL | 0 refills | Status: DC

## 2024-05-25 MED ORDER — POLYETHYLENE GLYCOL 3350 17 G PO PACK: 17.0000 g | PACK | Freq: Every day | ORAL | 0 refills | Status: AC | PRN

## 2024-05-25 MED ORDER — ASPIRIN 81 MG PO CHEW: 81.0000 mg | CHEWABLE_TABLET | Freq: Two times a day (BID) | ORAL | 0 refills | Status: AC

## 2024-05-25 MED ORDER — SENNA 8.6 MG PO TABS: 2.0000 | ORAL_TABLET | Freq: Every day | ORAL | 0 refills | Status: DC

## 2024-05-25 MED ORDER — SENNA 8.6 MG PO TABS: 2.0000 | ORAL_TABLET | Freq: Every day | ORAL | 0 refills | Status: AC

## 2024-05-25 MED ORDER — POLYETHYLENE GLYCOL 3350 17 G PO PACK: 17.0000 g | PACK | Freq: Every day | ORAL | 0 refills | Status: DC | PRN

## 2024-05-25 MED ORDER — DOCUSATE SODIUM 100 MG PO CAPS: 100.0000 mg | ORAL_CAPSULE | Freq: Two times a day (BID) | ORAL | 0 refills | Status: DC

## 2024-05-25 MED ORDER — ONDANSETRON HCL 4 MG PO TABS: 4.0000 mg | ORAL_TABLET | Freq: Three times a day (TID) | ORAL | 0 refills | Status: DC | PRN

## 2024-05-25 MED ORDER — HYDROCODONE-ACETAMINOPHEN 5-325 MG PO TABS: 1.0000 | ORAL_TABLET | ORAL | 0 refills | Status: DC | PRN

## 2024-05-25 NOTE — Progress Notes (Signed)
 Physical Therapy Treatment Patient Details Name: Marilyn Myers MRN: [id number] DOB: [birth year] Today's Date: 05/25/2024   History of Present Illness Pt s/p L THR with hx of DM and R THR (1/25)    PT Comments  Pt continues very cooperative and progressing well with mobility.  Pt up to ambulate in hall, negotiated stairs and reviewed car transfers.  Pt eager for dc home this date.    If plan is discharge home, recommend the following: A little help with walking and/or transfers;A little help with bathing/dressing/bathroom;Assistance with cooking/housework;Assist for transportation;Help with stairs or ramp for entrance   Can travel by private vehicle        Equipment Recommendations  None recommended by PT    Recommendations for Other Services       Precautions / Restrictions Precautions Precautions: Fall Restrictions Weight Bearing Restrictions Per Provider Order: No Other Position/Activity Restrictions: WBAT     Mobility  Bed Mobility               General bed mobility comments: Pt up in recliner and requests back to same    Transfers Overall transfer level: Needs assistance Equipment used: Rolling walker (2 wheels) Transfers: Sit to/from Stand Sit to Stand: Supervision           General transfer comment: min cues for LE management and use of UEs to self assist    Ambulation/Gait Ambulation/Gait assistance: Contact guard assist, Supervision Gait Distance (Feet): 50 Feet Assistive device: Rolling walker (2 wheels) Gait Pattern/deviations: Step-to pattern, Decreased step length - right, Decreased step length - left, Shuffle, Trunk flexed Gait velocity: decr     General Gait Details: cues for posture, position from RW and sequence   Stairs Stairs: Yes Stairs assistance: Contact guard assist Stair Management: No rails, Step to pattern, Backwards, Forwards, With walker Number of Stairs: 2 General stair comments: single step fwd and bkwd with RW and  cues for sequence   Wheelchair Mobility     Tilt Bed    Modified Rankin (Stroke Patients Only)       Balance Overall balance assessment: Needs assistance Sitting-balance support: Single extremity supported Sitting balance-Leahy Scale: Good     Standing balance support: No upper extremity supported Standing balance-Leahy Scale: Fair                              Hotel manager: No apparent difficulties  Cognition Arousal: Alert Behavior During Therapy: WFL for tasks assessed/performed   PT - Cognitive impairments: No apparent impairments                         Following commands: Intact      Cueing Cueing Techniques: Verbal cues  Exercises Total Joint Exercises Ankle Circles/Pumps: AROM, Both, 15 reps, Supine Quad Sets: AROM, Both, 10 reps, Supine Heel Slides: AROM, Left, 20 reps, Supine Hip ABduction/ADduction: AAROM, AROM, Left, 15 reps, Supine Long Arc Quad: AROM, Left, 10 reps, Seated    General Comments        Pertinent Vitals/Pain Pain Assessment Pain Assessment: 0-10 Pain Score: 6  Pain Location: L hip with activity Pain Descriptors / Indicators: Aching, Sore, Tightness Pain Intervention(s): Limited activity within patient's tolerance, Monitored during session, Premedicated before session, Ice applied, Other (comment) (pt requesting muscle relaxor)    Home Living   Living Arrangements: Alone  Prior Function            PT Goals (current goals can now be found in the care plan section) Acute Rehab PT Goals Patient Stated Goal: Regain IND PT Goal Formulation: With patient Time For Goal Achievement: 05/31/24 Potential to Achieve Goals: Good Progress towards PT goals: Progressing toward goals    Frequency    7X/week      PT Plan      Co-evaluation              AM-PAC PT 6 Clicks Mobility   Outcome Measure  Help needed turning from your back to  your side while in a flat bed without using bedrails?: A Little Help needed moving from lying on your back to sitting on the side of a flat bed without using bedrails?: A Little Help needed moving to and from a bed to a chair (including a wheelchair)?: A Little Help needed standing up from a chair using your arms (e.g., wheelchair or bedside chair)?: A Little Help needed to walk in hospital room?: A Little Help needed climbing 3-5 steps with a railing? : A Little 6 Click Score: 18    End of Session Equipment Utilized During Treatment: Gait belt Activity Tolerance: Patient tolerated treatment well Patient left: in chair;with call bell/phone within reach;with chair alarm set Nurse Communication: Mobility status PT Visit Diagnosis: Unsteadiness on feet (R26.81);Difficulty in walking, not elsewhere classified (R26.2)     Time: 8895-8879 PT Time Calculation (min) (ACUTE ONLY): 16 min  Charges:    $Gait Training: 8-22 mins $Therapeutic Exercise: 8-22 mins $Therapeutic Activity: 8-22 mins PT General Charges $$ ACUTE PT VISIT: 1 Visit                     [facility] PT Acute Rehabilitation Services Office [phone]    Sidnee Gambrill 05/25/2024, 12:32 PM

## 2024-05-25 NOTE — Progress Notes (Signed)
 Discharge pharmacy changed to [facility] on [address]- pt updated by primary RN

## 2024-05-25 NOTE — Progress Notes (Signed)
    Subjective: 1 Day Post-Op Procedure(s) (LRB): ARTHROPLASTY, HIP, TOTAL, ANTERIOR APPROACH (Left) Patient reports pain as 1 on 0-10 scale.   Denies CP or SOB.  Voiding without difficulty. Positive flatus. Objective: Vital signs in last 24 hours: Temp:  [96.8 F (36 C)-98.1 F (36.7 C)] 97.8 F (36.6 C) (07/04 0621) Pulse Rate:  [65-99] 89 (07/04 0621) Resp:  [11-18] 15 (07/04 0621) BP: (98-129)/(54-72) 119/65 (07/04 0621) SpO2:  [91 %-100 %] 99 % (07/04 0621)  Intake/Output from previous day: 07/03 0701 - 07/04 0700 In: 2291.2 [P.O.:700; I.V.:1291.2; IV Piggyback:300] Out: 1110 [Urine:910; Blood:200] Intake/Output this shift: No intake/output data recorded.  Labs: Recent Labs    05/25/24 0323  HGB 10.0*   Recent Labs    05/25/24 0323  WBC 8.7  RBC 3.39*  HCT 32.6*  PLT 199   Recent Labs    05/25/24 0323  NA 139  K 3.6  CL 103  CO2 25  BUN 28*  CREATININE 1.67*  GLUCOSE 214*  CALCIUM  8.2*   No results for input(s): LABPT, INR in the last 72 hours.  Physical Exam: Sensation intact distally Intact pulses distally Dorsiflexion/Plantar flexion intact Incision: dressing C/D/I No cellulitis present Compartment soft Body mass index is 32.28 kg/m.   Assessment/Plan: 1 Day Post-Op Procedure(s) (LRB): ARTHROPLASTY, HIP, TOTAL, ANTERIOR APPROACH (Left) Continue Care Continue pain medical management  Continue Incentive spirometry  Continue mobilization with PT Continue to ice surgical site WBAT ASA for DVT prophylaxis HGB today was 10.0, no s/sx of anemia   Plan to be d/c to home today    Jeoffrey LOISE Sages for Dr. Donaciano Sprang [facility] [phone] 05/25/2024, 9:16 AM

## 2024-05-25 NOTE — TOC Initial Note (Signed)
 Transition of Care [facility]) - Initial/Assessment Note    Patient Details  Name: Marilyn Myers MRN: [id number] Date of Birth: 07/25/1951  Transition of Care [facility]) CM/SW Contact:    Marilyn Manuella Quill, RN Phone Number: 05/25/2024, 11:52 AM  Clinical Narrative:                 Marilyn Myers w/ pt in room; pt says she lives at home; she plans to return at d/c; she identified POC son Marilyn Myers [phone]); he will provide transportation; pt verified insurance/PCP; she denied SDOH risks; pt has cane, walker, and shower chair; she does not have HH services, or home oxygen; no TOC needs.  Expected Discharge Plan: Home/Self Care Barriers to Discharge: No Barriers Identified   Patient Goals and CMS Choice   CMS Medicare.gov Compare Post Acute Care list provided to:: Patient        Expected Discharge Plan and Services   Discharge Planning Services: CM Consult   Living arrangements for the past 2 months: Single Family Home Expected Discharge Date: 05/25/24               DME Arranged: N/A DME Agency: NA       HH Arranged: NA HH Agency: NA        Prior Living Arrangements/Services Living arrangements for the past 2 months: Single Family Home Lives with:: Self Patient language and need for interpreter reviewed:: Yes Do you feel safe going back to the place where you live?: Yes      Need for Family Participation in Patient Care: Yes (Comment) Care giver support system in place?: Yes (comment) Current home services: DME (cane, walker, shower chair) Criminal Activity/Legal Involvement Pertinent to Current Situation/Hospitalization: No - Comment as needed  Activities of Daily Living   ADL Screening (condition at time of admission) Independently performs ADLs?: Yes (appropriate for developmental age) Is the patient deaf or have difficulty hearing?: No Does the patient have difficulty seeing, even when wearing glasses/contacts?: No Does the patient have difficulty concentrating,  remembering, or making decisions?: Yes  Permission Sought/Granted Permission sought to share information with : Case Manager Permission granted to share information with : Yes, Verbal Permission Granted  Share Information with NAME: Case Manager     Permission granted to share info w Relationship: Marilyn Myers (son) [phone]     Emotional Assessment Appearance:: Appears stated age Attitude/Demeanor/Rapport: Gracious Affect (typically observed): Accepting Orientation: : Oriented to Self, Oriented to Place, Oriented to  Time, Oriented to Situation Alcohol  / Substance Use: Not Applicable Psych Involvement: No (comment)  Admission diagnosis:  Primary osteoarthritis of left hip [M16.12] S/P total left hip arthroplasty [S03.357] Patient Active Problem List   Diagnosis Date Noted   Osteoarthritis of left hip 05/24/2024   S/P total left hip arthroplasty 05/24/2024   Cholelithiasis 04/17/2024   Diuretic-induced hypokalemia 04/13/2024   Status post hip replacement, right 01/04/2024   Osteoarthritis of right hip 12/01/2023   S/P total right hip arthroplasty 12/01/2023   Secondary insomnia 06/02/2022   Combined hyperlipidemia associated with type 2 diabetes mellitus (HCC) 05/12/2022   Major depression, recurrent, chronic (HCC)    History of suicide attempt - 2022 opiod overdose    Gastroesophageal reflux disease without esophagitis 01/07/2020   Obesity (BMI 30-39.9) 02/22/2019   Refusal of statin medication by patient 01/24/2018   Seasonal allergic rhinitis 01/24/2018   PVC's (premature ventricular contractions) 10/08/2016   Diabetic peripheral neuropathy (HCC) 09/28/2016   S/P colostomy takedown 07/02/2015   Osteopenia 05/19/2015  Hypertension associated with diabetes (HCC) 01/28/2015   Diverticulitis of large intestine 08/19/2014   Hydradenitis 08/19/2014   Nephrolithiasis 08/19/2014   Obstructive sleep apnea 08/01/2014   Controlled type 2 diabetes mellitus with diabetic  polyneuropathy, without long-term current use of insulin  (HCC) 06/10/2014   PCP:  Marilyn Lavern CROME, MD Pharmacy:   [id number] - [place], [place] - [address] [place] [address] Phone: [phone] Fax: [phone]  [facility] [id number] - [place], [place] - [address]  [place] [address] Phone: [phone] Fax: [phone]  [id number] - [place], [place] - [address] [place] [address] Phone: [phone] Fax: [phone]  [facility] - [facility] [address] [place] [address] Phone: [phone] Fax: [phone]     Social Drivers of Health (SDOH) Social History: SDOH Screenings   Food Insecurity: No Food Insecurity (05/25/2024)  Housing: Low Risk  (05/25/2024)  Transportation Needs: No Transportation Needs (05/25/2024)  Utilities: Not At Risk (05/25/2024)  Alcohol  Screen: Low Risk  (03/12/2024)  Depression (PHQ2-9): Low Risk  (04/13/2024)  Financial Resource Strain: Low Risk  (03/12/2024)  Physical Activity: Insufficiently Active (03/12/2024)  Social Connections: Moderately Isolated (05/24/2024)  Stress: No Stress Concern Present (03/12/2024)  Tobacco Use: Medium Risk (05/24/2024)  Health Literacy: Adequate Health Literacy (03/12/2024)   SDOH Interventions: Food Insecurity Interventions: Intervention Not Indicated, Inpatient TOC Housing Interventions: Intervention Not Indicated, Inpatient TOC Transportation Interventions: Intervention Not Indicated, Inpatient TOC Utilities Interventions: Intervention Not Indicated, Inpatient TOC   Readmission Risk Interventions     No data to display

## 2024-05-25 NOTE — Progress Notes (Signed)
 Physical Therapy Treatment Patient Details Name: Marilyn Myers MRN: [id number] DOB: 1951/09/05 Today's Date: 05/25/2024   History of Present Illness Pt s/p L THR with hx of DM and R THR (1/25)    PT Comments  Pt continues very cooperative and progressing well with mobility including decreased assist level for most tasks and improved activity tolerance.  Will follow up with stair training and pt should dc home with family this date.    If plan is discharge home, recommend the following: A little help with walking and/or transfers;A little help with bathing/dressing/bathroom;Assistance with cooking/housework;Assist for transportation;Help with stairs or ramp for entrance   Can travel by private vehicle        Equipment Recommendations  None recommended by PT    Recommendations for Other Services       Precautions / Restrictions Precautions Precautions: Fall Restrictions Weight Bearing Restrictions Per Provider Order: No Other Position/Activity Restrictions: WBAT     Mobility  Bed Mobility               General bed mobility comments: Pt up in recliner and requests back to same    Transfers Overall transfer level: Needs assistance Equipment used: Rolling walker (2 wheels) Transfers: Sit to/from Stand Sit to Stand: Contact guard assist, Supervision           General transfer comment: min cues for LE management and use of UEs to self assist    Ambulation/Gait Ambulation/Gait assistance: Contact guard assist, Supervision Gait Distance (Feet): 100 Feet Assistive device: Rolling walker (2 wheels) Gait Pattern/deviations: Step-to pattern, Decreased step length - right, Decreased step length - left, Shuffle, Trunk flexed Gait velocity: decr     General Gait Details: cues for posture, position from RW and sequence   Stairs             Wheelchair Mobility     Tilt Bed    Modified Rankin (Stroke Patients Only)       Balance Overall balance  assessment: Needs assistance Sitting-balance support: Single extremity supported Sitting balance-Leahy Scale: Good     Standing balance support: No upper extremity supported Standing balance-Leahy Scale: Fair                              Hotel manager: No apparent difficulties  Cognition Arousal: Alert Behavior During Therapy: WFL for tasks assessed/performed   PT - Cognitive impairments: No apparent impairments                         Following commands: Intact      Cueing Cueing Techniques: Verbal cues  Exercises Total Joint Exercises Ankle Circles/Pumps: AROM, Both, 15 reps, Supine Quad Sets: AROM, Both, 10 reps, Supine Heel Slides: AROM, Left, 20 reps, Supine Hip ABduction/ADduction: AAROM, AROM, Left, 15 reps, Supine Long Arc Quad: AROM, Left, 10 reps, Seated    General Comments        Pertinent Vitals/Pain Pain Assessment Pain Assessment: 0-10 Pain Score: 5  Pain Location: L hip with activity Pain Descriptors / Indicators: Aching, Sore Pain Intervention(s): Limited activity within patient's tolerance, Monitored during session, Premedicated before session, Ice applied    Home Living                          Prior Function            PT Goals (current  goals can now be found in the care plan section) Acute Rehab PT Goals Patient Stated Goal: Regain IND PT Goal Formulation: With patient Time For Goal Achievement: 05/31/24 Potential to Achieve Goals: Good Progress towards PT goals: Progressing toward goals    Frequency    7X/week      PT Plan      Co-evaluation              AM-PAC PT 6 Clicks Mobility   Outcome Measure  Help needed turning from your back to your side while in a flat bed without using bedrails?: A Little Help needed moving from lying on your back to sitting on the side of a flat bed without using bedrails?: A Little Help needed moving to and from a bed to a  chair (including a wheelchair)?: A Little Help needed standing up from a chair using your arms (e.g., wheelchair or bedside chair)?: A Little Help needed to walk in hospital room?: A Little Help needed climbing 3-5 steps with a railing? : A Lot 6 Click Score: 17    End of Session Equipment Utilized During Treatment: Gait belt Activity Tolerance: Patient tolerated treatment well Patient left: in chair;with call bell/phone within reach;with chair alarm set Nurse Communication: Mobility status PT Visit Diagnosis: Unsteadiness on feet (R26.81);Difficulty in walking, not elsewhere classified (R26.2)     Time: 9162-9097 PT Time Calculation (min) (ACUTE ONLY): 25 min  Charges:    $Gait Training: 8-22 mins $Therapeutic Exercise: 8-22 mins PT General Charges $$ ACUTE PT VISIT: 1 Visit                     I have been unable to reach this patient by phone.  A letter is being sent.    Kc Sedlak 05/25/2024, 9:08 AM

## 2024-05-25 NOTE — Progress Notes (Signed)
 AVS has been given to patient, patient was taken downstairs via wheel chair and she has left with son

## 2024-05-28 ENCOUNTER — Encounter (HOSPITAL_COMMUNITY): Payer: Self-pay | Admitting: Orthopedic Surgery

## 2024-06-12 DIAGNOSIS — Z471 Aftercare following joint replacement surgery: Secondary | ICD-10-CM | POA: Diagnosis not present

## 2024-06-12 DIAGNOSIS — Z96642 Presence of left artificial hip joint: Secondary | ICD-10-CM | POA: Diagnosis not present

## 2024-06-16 ENCOUNTER — Other Ambulatory Visit: Payer: Self-pay | Admitting: Family Medicine

## 2024-06-26 DIAGNOSIS — Z96642 Presence of left artificial hip joint: Secondary | ICD-10-CM | POA: Diagnosis not present

## 2024-06-26 DIAGNOSIS — Z471 Aftercare following joint replacement surgery: Secondary | ICD-10-CM | POA: Diagnosis not present

## 2024-07-09 DIAGNOSIS — Z96642 Presence of left artificial hip joint: Secondary | ICD-10-CM | POA: Diagnosis not present

## 2024-07-09 DIAGNOSIS — Z471 Aftercare following joint replacement surgery: Secondary | ICD-10-CM | POA: Diagnosis not present

## 2024-07-27 ENCOUNTER — Other Ambulatory Visit: Payer: Self-pay | Admitting: Family Medicine

## 2024-07-27 NOTE — Telephone Encounter (Signed)
 04/13/2024 LOV  01/04/2024 fill date  540 tab

## 2024-09-24 ENCOUNTER — Telehealth: Payer: Self-pay | Admitting: Family Medicine

## 2024-09-24 NOTE — Telephone Encounter (Signed)
 LVM to R/S for 10/22/24 / CM

## 2024-10-12 DIAGNOSIS — E119 Type 2 diabetes mellitus without complications: Secondary | ICD-10-CM | POA: Diagnosis not present

## 2024-10-12 DIAGNOSIS — H5203 Hypermetropia, bilateral: Secondary | ICD-10-CM | POA: Diagnosis not present

## 2024-10-12 DIAGNOSIS — H52223 Regular astigmatism, bilateral: Secondary | ICD-10-CM | POA: Diagnosis not present

## 2024-10-12 LAB — OPHTHALMOLOGY REPORT-SCANNED

## 2024-10-15 ENCOUNTER — Ambulatory Visit: Admitting: Family Medicine

## 2024-10-22 ENCOUNTER — Ambulatory Visit: Admitting: Family Medicine

## 2024-10-25 ENCOUNTER — Telehealth: Payer: Self-pay

## 2024-10-25 NOTE — Telephone Encounter (Signed)
 Copied from CRM [id number]. Topic: Clinical - Medication Question >> Oct 25, 2024  2:09 PM Alexandria E wrote: Reason for CRM: Patient was inquiring about starting Ozempic , as this was discussed previously. At that time, it was too expensive, but patient heard that now this medication will be $50 for Medicare patients and she is wanting to confirm if that is true.

## 2024-10-26 ENCOUNTER — Ambulatory Visit: Admitting: Family Medicine

## 2024-11-08 ENCOUNTER — Telehealth: Payer: Self-pay

## 2024-11-08 NOTE — Telephone Encounter (Signed)
 Copied from CRM #[id number]. Topic: Clinical - Medical Advice >> Nov 08, 2024  1:07 PM Chasity T wrote: Reason for CRM: Patient is calling because she is currently requesting medication for the flu. She does not currently have it but she is out of town and everyone she is with does currenlty have the flu and she wanted the medication to help her avoid getting it. Please call her back to discuss  Phone number:[phone]

## 2024-12-04 ENCOUNTER — Ambulatory Visit: Payer: Self-pay | Admitting: Family Medicine

## 2024-12-04 ENCOUNTER — Encounter: Payer: Self-pay | Admitting: Family Medicine

## 2024-12-04 ENCOUNTER — Ambulatory Visit: Admitting: Family Medicine

## 2024-12-04 VITALS — BP 132/82 | HR 97 | Temp 97.7°F | Ht 66.0 in | Wt 210.8 lb

## 2024-12-04 DIAGNOSIS — F339 Major depressive disorder, recurrent, unspecified: Secondary | ICD-10-CM

## 2024-12-04 DIAGNOSIS — E1159 Type 2 diabetes mellitus with other circulatory complications: Secondary | ICD-10-CM | POA: Diagnosis not present

## 2024-12-04 DIAGNOSIS — E782 Mixed hyperlipidemia: Secondary | ICD-10-CM

## 2024-12-04 DIAGNOSIS — R1011 Right upper quadrant pain: Secondary | ICD-10-CM | POA: Diagnosis not present

## 2024-12-04 DIAGNOSIS — I152 Hypertension secondary to endocrine disorders: Secondary | ICD-10-CM

## 2024-12-04 DIAGNOSIS — E1142 Type 2 diabetes mellitus with diabetic polyneuropathy: Secondary | ICD-10-CM

## 2024-12-04 DIAGNOSIS — Z7984 Long term (current) use of oral hypoglycemic drugs: Secondary | ICD-10-CM | POA: Diagnosis not present

## 2024-12-04 DIAGNOSIS — E1169 Type 2 diabetes mellitus with other specified complication: Secondary | ICD-10-CM | POA: Diagnosis not present

## 2024-12-04 DIAGNOSIS — Z5320 Procedure and treatment not carried out because of patient's decision for unspecified reasons: Secondary | ICD-10-CM

## 2024-12-04 LAB — COMPREHENSIVE METABOLIC PANEL WITH GFR
ALT: 36 U/L — ABNORMAL HIGH (ref 3–35)
AST: 17 U/L (ref 5–37)
Albumin: 3.9 g/dL (ref 3.5–5.2)
Alkaline Phosphatase: 128 U/L — ABNORMAL HIGH (ref 39–117)
BUN: 23 mg/dL (ref 6–23)
CO2: 29 meq/L (ref 19–32)
Calcium: 9.8 mg/dL (ref 8.4–10.5)
Chloride: 100 meq/L (ref 96–112)
Creatinine, Ser: 1.06 mg/dL (ref 0.40–1.20)
GFR: 51.36 mL/min — ABNORMAL LOW
Glucose, Bld: 172 mg/dL — ABNORMAL HIGH (ref 70–99)
Potassium: 3.7 meq/L (ref 3.5–5.1)
Sodium: 139 meq/L (ref 135–145)
Total Bilirubin: 0.4 mg/dL (ref 0.2–1.2)
Total Protein: 7.4 g/dL (ref 6.0–8.3)

## 2024-12-04 LAB — LIPID PANEL
Cholesterol: 192 mg/dL (ref 28–200)
HDL: 42.5 mg/dL
LDL Cholesterol: 82 mg/dL (ref 10–99)
NonHDL: 149.54
Total CHOL/HDL Ratio: 5
Triglycerides: 336 mg/dL — ABNORMAL HIGH (ref 10.0–149.0)
VLDL: 67.2 mg/dL — ABNORMAL HIGH (ref 0.0–40.0)

## 2024-12-04 LAB — HEMOGLOBIN A1C: Hgb A1c MFr Bld: 6.9 % — ABNORMAL HIGH (ref 4.6–6.5)

## 2024-12-04 LAB — MICROALBUMIN / CREATININE URINE RATIO
Creatinine,U: 75.2 mg/dL
Microalb Creat Ratio: 73.2 mg/g — ABNORMAL HIGH (ref 0.0–30.0)
Microalb, Ur: 5.5 mg/dL — ABNORMAL HIGH (ref 0.7–1.9)

## 2024-12-04 MED ORDER — DULOXETINE HCL 60 MG PO CPEP: 60.0000 mg | ORAL_CAPSULE | Freq: Every day | ORAL | 3 refills | Status: AC

## 2024-12-04 MED ORDER — DILTIAZEM HCL ER COATED BEADS 120 MG PO CP24: 120.0000 mg | ORAL_CAPSULE | Freq: Every day | ORAL | Status: AC

## 2024-12-04 MED ORDER — METFORMIN HCL 1000 MG PO TABS: 1000.0000 mg | ORAL_TABLET | Freq: Two times a day (BID) | ORAL | 3 refills | Status: AC

## 2024-12-04 MED ORDER — LOSARTAN POTASSIUM-HCTZ 100-25 MG PO TABS: 1.0000 | ORAL_TABLET | Freq: Every day | ORAL | 3 refills | Status: AC

## 2024-12-04 MED ORDER — EZETIMIBE 10 MG PO TABS: 10.0000 mg | ORAL_TABLET | Freq: Every day | ORAL | 3 refills | Status: AC

## 2024-12-04 MED ORDER — POTASSIUM CHLORIDE ER 20 MEQ PO TBCR: 1.0000 | EXTENDED_RELEASE_TABLET | Freq: Every day | ORAL | 3 refills | Status: AC

## 2024-12-04 MED ORDER — OZEMPIC (0.25 OR 0.5 MG/DOSE) 2 MG/3ML ~~LOC~~ SOPN: 0.2500 mg | PEN_INJECTOR | SUBCUTANEOUS | 5 refills | Status: AC

## 2024-12-04 NOTE — Progress Notes (Signed)
 See mychart note Dear Ms. Cuen, Your results look good overall. It would be good if we can get you back on ozempic . It would also be good if we could use farxiga or jardiance to help protect your kidney function. In the past, cost was a barrier. Let me know if your coverage allows for these to be more affordable this year.  Your diabetes is controlled on metformin  and glipizide  if we need to stick with these medications.  Your cholesterol levels, in the non fasting state, show high triglycerides: try to limit starches and fats best you can.  Sincerely, Dr. Jodie

## 2024-12-04 NOTE — Progress Notes (Signed)
 "  Subjective  CC:  Chief Complaint  Patient presents with   Hypertension   Diabetes    HPI: Marilyn Myers is a 74 y.o. female who presents to the office today for follow up of diabetes and problems listed above in the chief complaint.  Discussed the use of AI scribe software for clinical note transcription with the patient, who gave verbal consent to proceed.  History of Present Illness Marilyn Myers is a 74 year old female with diabetes and hyperlipidemia, htn and diabetes who is overdue for f/u and  who presents for follow-up and medication management.  Glycemic control and diabetes management - Diabetes managed with glipizide  and metformin . - Difficulty controlling blood glucose due to a diet high in carbohydrates, including bread and sandwiches. - Persistent increased thirst and dry mouth.  No polyuria - Recent weight gain of 10 to 12 pounds. - She would like to restart Ozempic  if she can afford it.  She is on Medicare.  It has been cost prohibitive in the past. - He has chronic neuropathy.  Fairly well-controlled with high-dose gabapentin .  No foot sores. - Remains on ACE.  Continues to refuse statin.  Hyperlipidemia and medication history - History of hyperlipidemia with previously elevated cholesterol levels. - Zetia  discontinued last year due to elevated liver enzymes.  She was to follow-up but never did.  No longer taking Zetia  but she does say that she was tolerating it fine. - Most recent LFTs had normalized in October.  She would be willing to restart.  She is not fasting for lipid recheck today.  Abdominal pain and gastrointestinal symptoms - Two episodes of pain under the ribcage, possibly related to the gallbladder. - Associated bloating and difficulty bending over. - No recent imaging studies performed for evaluation of abdominal pain. - Last May she had elevated LFTs and we never got follow-up.  She denies nausea vomiting diarrhea or  constipation.  Hypertension: Had been well-controlled - Typically has low blood pressure. - Does not monitor blood pressure outside of medical settings. - On ACE diuretic combination and reports good compliance.  No chest pain or shortness of breath or edema - Weight is up since last visit which could be contributing to higher reading.  Major depression is controlled on medication.    Wt Readings from Last 3 Encounters:  12/04/24 210 lb 12.8 oz (95.6 kg)  05/24/24 200 lb (90.7 kg)  05/17/24 200 lb (90.7 kg)    BP Readings from Last 3 Encounters:  12/04/24 132/82  05/25/24 92/65  05/17/24 125/78    Assessment  1. Controlled type 2 diabetes mellitus with diabetic polyneuropathy, without long-term current use of insulin  (HCC)   2. Diabetic peripheral neuropathy (HCC)   3. Combined hyperlipidemia associated with type 2 diabetes mellitus (HCC)   4. Hypertension associated with diabetes (HCC)   5. Major depression, recurrent, chronic   6. Refusal of statin medication by patient   7. RUQ pain      Plan  Assessment and Plan Assessment & Plan Type 2 diabetes mellitus with diabetic polyneuropathy, hypertension, and hyperlipidemia Diabetes management is suboptimal with increased thirst and dry mouth, indicating possible hyperglycemia. Hypertension is elevated today, possibly due to recent weight gain. Hyperlipidemia management was interrupted due to elevated liver enzymes, but liver function has normalized. She is interested in starting Ozempic  for weight management and diabetes control, with potential cost reduction for Medicare beneficiaries. - Ordered blood and urine tests to assess diabetes control and  kidney function. - Prescribed Ozempic  injection for diabetes management and weight loss.  Start 0.25 mg weekly if able to afford it. - Continue glipizide  and metformin . - Check renal function urine microalbumin - Footcare discussed on high-dose gabapentin  for neuropathy which is  controlled  Hypertension: Just above goal.  Continue hydrochlorothiazide  HCTZ and check renal function and electrolytes.  Work on weight loss to better improve blood pressure readings.  Hyperlipidemia and refusal of statin: Restart Zetia  10 mg nightly.  Recheck LFTs.  Evaluation of possible gallbladder disease Intermittent right upper quadrant pain and bloating suggest possible gallbladder disease. Previous elevated liver enzymes noted, but no prior imaging was performed. - Ordered abdominal ultrasound to evaluate gallbladder.  Check liver function test  Depression continue medication  General health maintenance She declined mammogram and flu vaccination, citing personal choice and belief in vaccine efficacy. - Discussed benefits of flu vaccination, including reduced severity of illness.    Follow up: 3 months for recheck Orders Placed This Encounter  Procedures   US  ABDOMEN LIMITED RUQ (LIVER/GB)   Hemoglobin A1c   Comprehensive metabolic panel with GFR   Lipid panel   Microalbumin / creatinine urine ratio   Meds ordered this encounter  Medications   diltiazem  (CARDIZEM  CD) 120 MG 24 hr capsule    Sig: Take 1 capsule (120 mg total) by mouth at bedtime.   DULoxetine  (CYMBALTA ) 60 MG capsule    Sig: Take 1 capsule (60 mg total) by mouth daily.    Dispense:  90 capsule    Refill:  3   losartan -hydrochlorothiazide  (HYZAAR) 100-25 MG tablet    Sig: Take 1 tablet by mouth daily.    Dispense:  90 tablet    Refill:  3   metFORMIN  (GLUCOPHAGE ) 1000 MG tablet    Sig: Take 1 tablet (1,000 mg total) by mouth 2 (two) times daily.    Dispense:  180 tablet    Refill:  3   Potassium Chloride  ER 20 MEQ TBCR    Sig: Take 1 tablet (20 mEq total) by mouth daily.    Dispense:  90 tablet    Refill:  3   Semaglutide ,0.25 or 0.5MG /DOS, (OZEMPIC , 0.25 OR 0.5 MG/DOSE,) 2 MG/3ML SOPN    Sig: Inject 0.25 mg into the skin once a week.    Dispense:  3 mL    Refill:  5   ezetimibe  (ZETIA ) 10 MG  tablet    Sig: Take 1 tablet (10 mg total) by mouth daily.    Dispense:  90 tablet    Refill:  3      Immunization History  Administered Date(s) Administered   Fluad Quad(high Dose 65+) 08/27/2019, 09/22/2021, 08/16/2022   INFLUENZA, HIGH DOSE SEASONAL PF 08/19/2014, 09/02/2015, 10/08/2016, 09/22/2017, 08/29/2018   Influenza-Unspecified 10/13/2020   PFIZER(Purple Top)SARS-COV-2 Vaccination 12/29/2019, 01/18/2020, 10/13/2020   Pneumococcal Conjugate-13 10/18/2016   Pneumococcal Polysaccharide-23 08/19/2014    Diabetes Related Lab Review: Lab Results  Component Value Date   HGBA1C 6.6 (H) 04/13/2024   HGBA1C 6.2 (A) 01/04/2024   HGBA1C 6.2 (H) 11/25/2023    Lab Results  Component Value Date   MICROALBUR 15.0 (H) 04/13/2024   Lab Results  Component Value Date   CREATININE 1.67 (H) 05/25/2024   BUN 28 (H) 05/25/2024   NA 139 05/25/2024   K 3.6 05/25/2024   CL 103 05/25/2024   CO2 25 05/25/2024   Lab Results  Component Value Date   CHOL 222 (H) 04/13/2024   CHOL 183 03/24/2023  CHOL 148 05/12/2022   Lab Results  Component Value Date   HDL 62.80 04/13/2024   HDL 45.90 03/24/2023   HDL 44.60 05/12/2022   Lab Results  Component Value Date   LDLCALC 131 (H) 04/13/2024   LDLCALC 70 05/12/2022   LDLCALC 87 11/06/2018   Lab Results  Component Value Date   TRIG 137.0 04/13/2024   TRIG 299.0 (H) 03/24/2023   TRIG 166.0 (H) 05/12/2022   Lab Results  Component Value Date   CHOLHDL 4 04/13/2024   CHOLHDL 4 03/24/2023   CHOLHDL 3 05/12/2022   Lab Results  Component Value Date   LDLDIRECT 108.0 03/24/2023   LDLDIRECT 144.0 02/05/2022   LDLDIRECT 117.0 01/16/2021   The 10-year ASCVD risk score (Arnett DK, et al., 2019) is: 38.9%   Values used to calculate the score:     Age: 78 years     Clinically relevant sex: Female     Is Non-Hispanic African American: No     Diabetic: Yes     Tobacco smoker: No     Systolic Blood Pressure: 132 mmHg     Is BP treated:  Yes     HDL Cholesterol: 62.8 mg/dL     Total Cholesterol: 222 mg/dL I have reviewed the PMH, Fam and Soc history. Patient Active Problem List   Diagnosis Date Noted Date Diagnosed   Combined hyperlipidemia associated with type 2 diabetes mellitus (HCC) 05/12/2022     Priority: High   Major depression, recurrent, chronic      Priority: High   History of suicide attempt - 2022 opiod overdose      Priority: High   Obesity (BMI 30-39.9) 02/22/2019     Priority: High   Diabetic peripheral neuropathy (HCC) 09/28/2016     Priority: High    Failed lyrica    Hypertension associated with diabetes (HCC) 01/28/2015     Priority: High   Obstructive sleep apnea 08/01/2014     Priority: High   Controlled type 2 diabetes mellitus with diabetic polyneuropathy, without long-term current use of insulin  (HCC) 06/10/2014     Priority: High    Has never been on GLP-1 or SGLT2.  Cost prohibitive 2023 Ozempic . Glucotrol  XL 5 mg started May 2024, trying to get under better control quickly so she can have hip replacement surgery. Tolerates high-dose metformin     Secondary insomnia 06/02/2022     Priority: Medium    Gastroesophageal reflux disease without esophagitis 01/07/2020     Priority: Medium    Osteopenia 05/19/2015     Priority: Medium     Dexa: 05/2023, lowest T = -1.4 left total femur. FRAX OK. Recheck 2 years. [facility] T = -1.4 Dexa 2016.  Dexa 2019 at GYN: normal at all sites. See records    Diverticulitis of large intestine 08/19/2014     Priority: Medium     Overview:  2015    Nephrolithiasis 08/19/2014     Priority: Medium     Overview:  Dr. Carmin    Refusal of statin medication by patient 01/24/2018     Priority: Low    Husband has problems with this medication for her    Seasonal allergic rhinitis 01/24/2018     Priority: Low   PVC's (premature ventricular contractions) 10/08/2016     Priority: Low   S/P colostomy takedown 07/02/2015     Priority: Low   Hydradenitis  08/19/2014     Priority: Low   Osteoarthritis of left hip 05/24/2024  S/P total left hip arthroplasty 05/24/2024    Cholelithiasis 04/17/2024    Diuretic-induced hypokalemia 04/13/2024     Potassium 20 meq daily, even with ARB    Status post hip replacement, right 01/04/2024    Osteoarthritis of right hip 12/01/2023    S/P total right hip arthroplasty 12/01/2023     Social History: Patient  reports that she quit smoking about 28 years ago. Her smoking use included cigarettes. She started smoking about 53 years ago. She has a 25 pack-year smoking history. She has never used smokeless tobacco. She reports that she does not drink alcohol  and does not use drugs.  Review of Systems: Ophthalmic: negative for eye pain, loss of vision or double vision Cardiovascular: negative for chest pain Respiratory: negative for SOB or persistent cough Gastrointestinal: negative for abdominal pain Genitourinary: negative for dysuria or gross hematuria MSK: negative for foot lesions Neurologic: negative for weakness or gait disturbance  Objective  Vitals: BP 132/82   Pulse 97   Temp 97.7 F (36.5 C)   Ht 5' 6 (1.676 m)   Wt 210 lb 12.8 oz (95.6 kg)   SpO2 97%   BMI 34.02 kg/m  General: well appearing, no acute distress  Psych:  Alert and oriented, normal mood and affect HEENT:  Normocephalic, atraumatic, moist mucous membranes, supple neck  Cardiovascular:  Nl S1 and S2, RRR without murmur, gallop or rub. no edema Respiratory:  Good breath sounds bilaterally, CTAB with normal effort, no rales Gastrointestinal: normal BS, soft, nontender  Diabetic education: ongoing education regarding chronic disease management for diabetes was given today. We continue to reinforce the ABC's of diabetic management: A1c (<7 or 8 dependent upon patient), tight blood pressure control, and cholesterol management with goal LDL < 100 minimally. We discuss diet strategies, exercise recommendations, medication options  and possible side effects. At each visit, we review recommended immunizations and preventive care recommendations for diabetics and stress that good diabetic control can prevent other problems. See below for this patient's data. Commons side effects, risks, benefits, and alternatives for medications and treatment plan prescribed today were discussed, and the patient expressed understanding of the given instructions. Patient is instructed to call or message via MyChart if he/she has any questions or concerns regarding our treatment plan. No barriers to understanding were identified. We discussed Red Flag symptoms and signs in detail. Patient expressed understanding regarding what to do in case of urgent or emergency type symptoms.  Medication list was reconciled, printed and provided to the patient in AVS. Patient instructions and summary information was reviewed with the patient as documented in the AVS. This note was prepared with assistance of Dragon voice recognition software. Occasional wrong-word or sound-a-like substitutions may have occurred due to the inherent limitations of voice recognition software   "

## 2024-12-12 NOTE — Telephone Encounter (Signed)
 US  scheduled.

## 2025-01-02 ENCOUNTER — Other Ambulatory Visit

## 2025-01-07 ENCOUNTER — Other Ambulatory Visit

## 2025-03-06 ENCOUNTER — Ambulatory Visit: Admitting: Family Medicine

## 2025-03-14 ENCOUNTER — Ambulatory Visit
